# Patient Record
Sex: Female | Born: 1960 | Hispanic: Yes | Marital: Married | Smoking: Never smoker
Health system: Southern US, Community
[De-identification: ages and names within clinical notes are randomized; demographics above are authoritative.]

## PROBLEM LIST (undated history)

## (undated) ENCOUNTER — Emergency Department: Admission: EM | Discharge status: Absent without leave | Payer: BC Managed Care – PPO

## (undated) DIAGNOSIS — G893 Neoplasm related pain (acute) (chronic): Secondary | ICD-10-CM

## (undated) DIAGNOSIS — Z789 Other specified health status: Secondary | ICD-10-CM

## (undated) DIAGNOSIS — C801 Malignant (primary) neoplasm, unspecified: Secondary | ICD-10-CM

## (undated) HISTORY — DX: Other specified health status: Z78.9

## (undated) HISTORY — DX: Neoplasm related pain (acute) (chronic): G89.3

## (undated) HISTORY — PX: OTHER SURGICAL HISTORY: SHX169

---

## 2013-05-04 ENCOUNTER — Ambulatory Visit: Payer: Self-pay | Admitting: Family Medicine

## 2017-09-14 ENCOUNTER — Other Ambulatory Visit: Payer: Self-pay | Admitting: Family Medicine

## 2017-09-14 DIAGNOSIS — Z1231 Encounter for screening mammogram for malignant neoplasm of breast: Secondary | ICD-10-CM

## 2018-10-26 DIAGNOSIS — U071 COVID-19: Secondary | ICD-10-CM

## 2018-10-26 HISTORY — DX: COVID-19: U07.1

## 2019-04-06 ENCOUNTER — Other Ambulatory Visit: Payer: Self-pay | Admitting: Physician Assistant

## 2019-04-06 DIAGNOSIS — Z1231 Encounter for screening mammogram for malignant neoplasm of breast: Secondary | ICD-10-CM

## 2019-07-26 ENCOUNTER — Other Ambulatory Visit: Payer: Self-pay | Admitting: Physician Assistant

## 2019-07-26 DIAGNOSIS — R221 Localized swelling, mass and lump, neck: Secondary | ICD-10-CM

## 2019-07-28 ENCOUNTER — Ambulatory Visit: Payer: Self-pay | Attending: Internal Medicine

## 2019-07-28 DIAGNOSIS — Z23 Encounter for immunization: Secondary | ICD-10-CM

## 2019-07-28 NOTE — Progress Notes (Signed)
   Covid-19 Vaccination Clinic  Name:  Jill Rodriguez    MRN: [id number] DOB: 12-Apr-1961  07/28/2019  Ms. Jill Rodriguez was observed post Covid-19 immunization for 15 minutes without incident. She was provided with Vaccine Information Sheet and instruction to access the V-Safe system.   Ms. Jill Rodriguez was instructed to call 911 with any severe reactions post vaccine: [name] Kitchen Difficulty breathing  . Swelling of face and throat  . A fast heartbeat  . A bad rash all over body  . Dizziness and weakness   Immunizations Administered    Name Date Dose VIS Date Route   Pfizer COVID-19 Vaccine 07/28/2019  4:03 PM 0.3 mL 04/07/2019 Intramuscular   Manufacturer: [place]   Lot: [id number]   [place]: [id number]

## 2019-08-10 ENCOUNTER — Other Ambulatory Visit: Payer: Self-pay

## 2019-08-10 ENCOUNTER — Ambulatory Visit
Admission: RE | Admit: 2019-08-10 | Discharge: 2019-08-10 | Disposition: A | Discharge status: Home / Self Care | Payer: BC Managed Care – PPO | Source: Ambulatory Visit | Attending: Physician Assistant | Admitting: Physician Assistant

## 2019-08-10 DIAGNOSIS — R221 Localized swelling, mass and lump, neck: Secondary | ICD-10-CM

## 2019-08-16 ENCOUNTER — Other Ambulatory Visit: Payer: Self-pay

## 2019-08-16 ENCOUNTER — Other Ambulatory Visit (HOSPITAL_COMMUNITY)
Admission: RE | Admit: 2019-08-16 | Discharge: 2019-08-16 | Disposition: A | Discharge status: Home / Self Care | Payer: BC Managed Care – PPO | Source: Ambulatory Visit | Attending: Obstetrics and Gynecology | Admitting: Obstetrics and Gynecology

## 2019-08-16 ENCOUNTER — Encounter: Payer: Self-pay | Admitting: Obstetrics and Gynecology

## 2019-08-16 ENCOUNTER — Ambulatory Visit (INDEPENDENT_AMBULATORY_CARE_PROVIDER_SITE_OTHER): Payer: Self-pay | Admitting: Obstetrics and Gynecology

## 2019-08-16 VITALS — BP 145/74 | HR 88 | Ht 61.5 in | Wt 188.1 lb

## 2019-08-16 DIAGNOSIS — N888 Other specified noninflammatory disorders of cervix uteri: Secondary | ICD-10-CM

## 2019-08-16 DIAGNOSIS — N950 Postmenopausal bleeding: Secondary | ICD-10-CM

## 2019-08-16 DIAGNOSIS — Z124 Encounter for screening for malignant neoplasm of cervix: Secondary | ICD-10-CM | POA: Insufficient documentation

## 2019-08-16 DIAGNOSIS — R102 Pelvic and perineal pain: Secondary | ICD-10-CM

## 2019-08-16 NOTE — Progress Notes (Signed)
Pt present due to PMB polyp of cervix. Pt having lower abd pain that comes and go. Pt is currently not taking any medication for pain.

## 2019-08-16 NOTE — Progress Notes (Signed)
GYNECOLOGY CLINIC PROGRESS NOTE Subjective:   Spanish interpreter present for today's visit.    Jill Rodriguez is a 59 y.o. [phone] Spanish speaking post-menopausal female who presents as a referral from [facility] for concerns regarding postmenopausal bleeding. She has been menopausal since age 5. She has never been on HRT. Bleeding has been ongoing for the past several months per records from the past visit, however patient reports that it has been off and on for the past year. She reports passage of clots at times, and otherwise sometimes is just spotting.  Bleeding can last from days to weeks and can range from light to moderate flow. Associated crampy pelvic/abdominal pain.  Workup to date: CBC and TSH.  Of note, patient does report a history of PMB ~ 5 years ago, states she was treated with pills for a short time and the bleeding resolved.   Menstrual History: Menarche age: 39 No LMP recorded. (Menstrual status: Other). Last pap smear: 2019.  Patient denies history of abnormal pap smears.  Mammogram: 2015.  Ordered by PCP in December 2020.     OB History  Gravida Para Term Preterm AB Living  3 3 3     3   SAB TAB Ectopic Multiple Live Births          3    # Outcome Date GA Lbr Len/2nd Weight Sex Delivery Anes PTL Lv  3 Term 50    F Vag-Spont   LIV  2 Term 1985    F Vag-Spont   LIV  1 Term 57    M Vag-Spont   LIV     Past Medical History:  Diagnosis Date  . No pertinent past medical history     Family History  Problem Relation Age of Onset  . Blindness Mother   . Glaucoma Mother   . Diabetes Father     Past Surgical History:  Procedure Laterality Date  . no surgical history      Social History   Socioeconomic History  . Marital status: Married    Spouse name: Not on file  . Number of children: Not on file  . Years of education: Not on file  . Highest education level: Not on file  Occupational History  . Not on file    Tobacco Use  . Smoking status: Never Smoker  . Smokeless tobacco: Never Used  Substance and Sexual Activity  . Alcohol use: Never  . Drug use: Never  . Sexual activity: Yes    Birth control/protection: None  Other Topics Concern  . Not on file  Social History Narrative  . Not on file   Social Determinants of Health   Financial Resource Strain:   . Difficulty of Paying Living Expenses:   Food Insecurity:   . Worried About Charity fundraiser in the Last Year:   . Arboriculturist in the Last Year:   Transportation Needs:   . Film/video editor (Medical):   [name] Kitchen Lack of Transportation (Non-Medical):   Physical Activity:   . Days of Exercise per Week:   . Minutes of Exercise per Session:   Stress:   . Feeling of Stress :   Social Connections:   . Frequency of Communication with Friends and Family:   . Frequency of Social Gatherings with Friends and Family:   . Attends Religious Services:   . Active Member of Clubs or Organizations:   . Attends Archivist Meetings:   .  Marital Status:   Intimate Partner Violence:   . Fear of Current or Ex-Partner:   . Emotionally Abused:   [name] Kitchen Physically Abused:   . Sexually Abused:     No current outpatient medications on file prior to visit.   No current facility-administered medications on file prior to visit.    No Known Allergies    Review of Systems Constitutional: negative for chills, fatigue, fevers and sweats Eyes: negative for irritation, redness and visual disturbance Ears, nose, mouth, throat, and face: negative for hearing loss, nasal congestion, snoring and tinnitus Respiratory: negative for asthma, cough, sputum Cardiovascular: negative for chest pain, dyspnea, exertional chest pressure/discomfort, irregular heart beat, palpitations and syncope Gastrointestinal: negative for abdominal pain, change in bowel habits, nausea and vomiting Genitourinary: positive for postmenopausal bleeding (see HPI) and pelvic  pain. Negative for genital lesions, sexual problems and vaginal discharge, dysuria and urinary incontinence Integument/breast: negative for breast lump, breast tenderness and nipple discharge Hematologic/lymphatic: negative for bleeding and easy bruising Musculoskeletal:negative for back pain and muscle weakness Neurological: negative for dizziness, headaches, vertigo and weakness Endocrine: negative for diabetic symptoms including polydipsia, polyuria and skin dryness Allergic/Immunologic: negative for hay fever and urticaria      Objective:    BP (!) 145/74   Pulse 88   Ht 5' 1.5" (1.562 m)   Wt 188 lb 1.6 oz (85.3 kg)   BMI 34.97 kg/m   General appearance: alert and no distress Lungs: clear to auscultation bilaterally Heart: regular rate and rhythm, S1, S2 normal, no murmur, click, rub or gallop Abdomen: normal findings: no masses palpable and soft and abnormal findings:  mild tenderness in lower abdomien, midline Pelvic:    -  VULVA: normal appearing vulva with no masses, tenderness or lesions,      -  VAGINA: normal appearing vagina with normal color and discharge, no lesions.  Moderate amount of blood in vaginal vault.     - CERVIX: friable, lesion present: cervix with irregularly shaped growth that is possibly eroding the cervix, firm.     - UTERUS: uterus is normal size, shape, consistency and nontender    - ADNEXA: normal adnexa in size, nontender and no masses,     - RECTAL: not performed Extremities: extremities normal, atraumatic, no cyanosis or edema Neurologic: Grossly normal   Assessment:    Postmenopausal bleeding   Cervical mass  Pelvic cramping  Plan:   - Diagnosis explained in detail, including differential. Discussed etiologies of postmenopausal bleeding, concern about precancerous/hyperplasia or cancerous etiology (5 to 10% percent of cases).  Exclusion of cancer is the main objective; therefore, treatment is may or may not be necessary once cancer has  been excluded.  Evaluation includes endometrial biopsy and pelvic ultrasound. Biopsy performed today.  Will return in 1 week for ultrasound and follow up of all results.  - Recent CBC and TSH performed at [facility] were normal. No concerns for anemia in light of bleeding at this time. Patient also asymptomatic.  - Given Tylenol for cramping in the office. Advised that she can continue use at home as well.     Endometrial Biopsy Procedure Note  The patient is positioned on the exam table in the dorsal lithotomy position. Bimanual exam confirms uterine position and size. A Graves speculum is placed into the vagina. A single toothed tenaculum is placed onto the anterior lip of the cervix. The pipette is placed into the endocervical canal and is advanced to the uterine fundus. Using a  piston like technique, with vacuum created by withdrawing the stylus, the endometrial specimen is obtained and transferred to the biopsy container. Four passes made, with minimal tissue collected. Minimal bleeding is encountered. The procedure is well tolerated.   Uterine Position:mid    Uterine Length:  7 cm   Uterine Specimen: Scant.  Yellow fluid and blood also collected.    Post procedure instructions are given. The patient is scheduled for follow up appointment.    Rubie Maid, MD [facility]

## 2019-08-16 NOTE — Patient Instructions (Signed)
Metrorragia posmenopusica Postmenopausal Bleeding  La metrorragia posmenopusica es el sangrado que tiene una mujer despus de haber entrado en la menopausia. La menopausia es el final de la edad frtil de la mujer. Despus de la menopausia, una mujer deja de ovular, por lo que deja de tener perodos menstruales. La metrorragia posmenopusica podra tener varias causas; entre ellas:  Terapia hormonal para la menopausia.  Atrofia del endometrio. Despus de la menopausia, los bajos niveles de las hormonas estrgeno hacen que la membrana que recubre el tero (endometrio) se afine. Podra tener sangrado mientras el endometrio se afina.  Hiperplasia de endometrio. Esta afeccin es consecuencia de un exceso de las hormonas estrgeno y de bajos niveles de las hormonas progesterona. El exceso de estrgeno hace que el endometrio se engrose, lo que puede provocar sangrado. En algunos casos, esto puede provocar cncer de tero.  Cncer de endometrio.  Crecimientos (plipos) no cancerosos en el endometrio, el recubrimiento del tero, o en el cuello uterino.  Fibromas uterinos. Estos son crecimientos no cancerosos dentro o alrededor del tejido muscular del tero, que pueden provocar un sangrado abundante. Todo tipo de sangrado posmenopusico, incluso si parece ser un perodo menstrual normal, debe ser evaluado por su mdico. El tratamiento depender de la causa del sangrado. Siga estas indicaciones en su casa:  Est atenta a cualquier cambio en los sntomas.  Evite las duchas vaginales y el uso de tampones como se lo haya indicado el mdico.  Cmbiese las toallas higinicas de forma regular.  Hgase exmenes plvicos regulares y pruebas de Papanicolaou.  Tome los suplementos de hierro como se lo haya indicado el mdico.  Tome los medicamentos de venta libre y los recetados solamente como se lo haya indicado el mdico.  Concurra a todas las visitas de control como se lo haya indicado el mdico.  Esto es importante. Comunquese con un mdico si:  El sangrado dura ms de 1 semana.  Siente dolor abdominal.  Tiene sangrado durante las relaciones sexuales o luego de estas.  Tiene sangrado que ocurre con mayor frecuencia que cada 3semanas. Solicite ayuda de inmediato si:  Usted tiene fiebre, escalofros, mareos, dolor de cabeza, dolores musculares y hemorragias.  Tiene dolor intenso con el sangrado.  Elimina cogulos de sangre.  Tiene un sangrado abundante, necesita ms de 1toalla higinica por hora y nunca present esto antes.  Siente que va a desmayarse. Resumen  La metrorragia posmenopusica es el sangrado que tiene una mujer despus de haber entrado en la menopausia.  El sangrado posmenopusico podra tener varias causas. El tratamiento depender de la causa del sangrado.  Todo tipo de sangrado posmenopusico, incluso si parece ser un perodo menstrual normal, debe ser evaluado por su mdico.  Es muy importante que est atenta a cualquier cambio en los sntomas y que concurra a todas las visitas de control como se lo haya indicado el mdico. Esta informacin no tiene como fin reemplazar el consejo del mdico. Asegrese de hacerle al mdico cualquier pregunta que tenga. Document Revised: 01/04/2017 Document Reviewed: 01/04/2017 Elsevier Patient Education  2020 Elsevier Inc.  

## 2019-08-18 ENCOUNTER — Ambulatory Visit: Payer: BC Managed Care – PPO | Attending: Internal Medicine

## 2019-08-18 DIAGNOSIS — Z23 Encounter for immunization: Secondary | ICD-10-CM

## 2019-08-18 LAB — SURGICAL PATHOLOGY

## 2019-08-18 NOTE — Progress Notes (Signed)
   Covid-19 Vaccination Clinic  Name:  Jill Rodriguez    MRN: [id number] DOB: Mar 05, 1961  08/18/2019  Jill Rodriguez was observed post Covid-19 immunization for 15 minutes without incident. She was provided with Vaccine Information Sheet and instruction to access the V-Safe system.   Jill Rodriguez was instructed to call 911 with any severe reactions post vaccine: [name] Kitchen Difficulty breathing  . Swelling of face and throat  . A fast heartbeat  . A bad rash all over body  . Dizziness and weakness   Immunizations Administered    Name Date Dose VIS Date Route   Pfizer COVID-19 Vaccine 08/18/2019  3:56 PM 0.3 mL 06/21/2018 Intramuscular   Manufacturer: [employer]   Lot: [id number]   [place]: [id number]

## 2019-08-22 NOTE — Progress Notes (Signed)
Pt present for follow up for PMS and ultrasound results. Pt stated that she was doing well.

## 2019-08-23 ENCOUNTER — Ambulatory Visit (INDEPENDENT_AMBULATORY_CARE_PROVIDER_SITE_OTHER): Payer: BC Managed Care – PPO

## 2019-08-23 ENCOUNTER — Ambulatory Visit (INDEPENDENT_AMBULATORY_CARE_PROVIDER_SITE_OTHER): Payer: BC Managed Care – PPO | Admitting: Obstetrics and Gynecology

## 2019-08-23 ENCOUNTER — Other Ambulatory Visit: Payer: Self-pay

## 2019-08-23 ENCOUNTER — Encounter: Payer: Self-pay | Admitting: Obstetrics and Gynecology

## 2019-08-23 VITALS — BP 131/76 | HR 73 | Ht 61.5 in | Wt 188.5 lb

## 2019-08-23 DIAGNOSIS — C531 Malignant neoplasm of exocervix: Secondary | ICD-10-CM | POA: Diagnosis not present

## 2019-08-23 DIAGNOSIS — N950 Postmenopausal bleeding: Secondary | ICD-10-CM

## 2019-08-23 DIAGNOSIS — N888 Other specified noninflammatory disorders of cervix uteri: Secondary | ICD-10-CM | POA: Diagnosis not present

## 2019-08-23 NOTE — Progress Notes (Signed)
GYNECOLOGY PROGRESS NOTE  Subjective:    Patient ID: Jill Rodriguez, female    DOB: 1961/01/22, 59 y.o.   MRN: [id number]  HPI Spanish interpreter and patient's daughter present for today's visit.  Patient is a 59 y.o. [id number] Spanish-speaking female who presents for follow up of ultrasound and biopsy results.  Patient has a history of PMB. Patient reports that since her last visit last week that her bleeding has slowed down.  She denies dizziness, shortness of breath.  She currently denies any pelvic pain.   The following portions of the patient's history were reviewed and updated as appropriate: allergies, current medications, past family history, past medical history, past social history, past surgical history and problem list.  Review of Systems Pertinent items noted in HPI and remainder of comprehensive ROS otherwise negative.   Objective:   Blood pressure 131/76, pulse 73, height 5' 1.5" (1.562 m), weight 188 lb 8 oz (85.5 kg). General appearance: alert and no distress Abdomen: soft, non-tender; bowel sounds normal; no masses,  no organomegaly Pelvic:  -  VULVA: normal appearing vulva with no masses, tenderness or lesions,      -  VAGINA: normal appearing vagina with normal color and discharge, no lesions.  Small amount of dark red blood in vaginal vault.     - CERVIX: friable, lesion present: cervix with irregularly shaped growth that is possibly eroding the cervix, firm.     - UTERUS: uterus is normal size, shape, consistency and nontender    - ADNEXA: normal adnexa in size, nontender and no masses,     - RECTAL: not performed   Pathology:  SURGICAL PATHOLOGY  CASE: [id number]  PATIENT: [place]  Surgical Pathology Report   Clinical History: PMB, cervical mass (cm)   FINAL MICROSCOPIC DIAGNOSIS:   A. ENDOMETRIUM, BIOPSY:  - Poorly differentiated squamous cell carcinoma.  - See comment.   COMMENT:  There are fragments of poorly differentiated  squamous cell carcinoma.  Endometrial tissue is not identified.     Imaging:  Patient Name: Jill Rodriguez DOB: 09/13/1960 MRN: [id number] ULTRASOUND REPORT  Location: [facility]  Date of Service: 08/23/2019     Indications:AUB Findings:  The uterus is anteverted and measures 6.1 x 3.4 x 4.9 cm. Echo texture is homogenous without evidence of focal masses. Within the uterus are multiple suspected fibroids measuring:  The Endometrium measures 8 mm. Echogenic endometrial microcalcifications seen through -out endometrium. An hypoechoic area seen in the fundal aspect of the endometrium measuring 0.8 x 1.1 x 2.3 cm  Right Ovary measures 1.6 x 1.1 x 1.2  cm. It is normal in appearance. Left Ovary measures 2.2 x 1.5 x 1.7 cm. It is normal in appearance. Survey of the adnexa demonstrates no adnexal masses. There is no free fluid in the cul de sac.  Impression: 1. Endometrial microcalcifications as described above with a hypoechoic area most likely retain blood in the fundal aspect of the endometrium. 2. Survey of the adnexa demonstrates no adnexal masses.  Recommendations: 1.Clinical correlation with the patient's History and Physical Exam.   Jenine M. Albertine Grates    RDMS     Assessment:   Suspected cervical cancer Post-menopausal bleeding  Plan:   -Discussion had with patient regarding her recent pathology and ultrasound results.  Pap smear was also performed at patient's last visit however results do not appear to have been resulted.  However based on physical exam with cervical lesion present, pathology noting squamous cell carcinoma,  and inability to identify endometrial tissue despite endometrial biopsy with 4 passes, my concern is that of cervical cancer.  Discussion had with patient on need for immediate follow-up with GYN oncology,  who can perform further work-up as needed as well as discussed treatment options.  All questions from patient and her daughter  were answered.   A total of 15 minutes were spent face-to-face with the patient during this encounter and over half of that time dealt with counseling and coordination of care.

## 2019-08-23 NOTE — Patient Instructions (Signed)
Cncer de cuello del tero Cervical Cancer  El cncer de cuello del tero es el crecimiento anormal de clulas en el cuello del tero. El cuello del tero es la abertura y la parte inferior del tero. Se encuentra entre la vagina y Nurse, learning disability. Hay tres tipos principales de cncer de cuello del tero:  Carcinoma de clulas escamosas. Este tipo de cncer se inicia en las clulas que recubren la superficie del cuello del tero.  Adenocarcinoma. Este tipo de cncer de cuello del tero se inicia en las clulas glandulares que recubren el cuello del tero (glandular).  Sarcoma de cuello del tero. Es un tumor poco frecuente que se cree que se desarrolla de Development worker, community. Cules son las causas? La mayora de los casos de cncer de cuello del tero son provocados por un virus llamado virus del Engineer, technical sales (VPH). Qu incrementa el riesgo? Esta afeccin es ms probable en las mujeres que tienen estas caractersticas:  Tienen una infeccin viral de transmisin sexual. Estas incluyen los siguientes: ? Clamidia. ? Herpes. ? VPH.  Tienen entre [phone] [id number].  Comenzaron su actividad sexual antes de los 18aos.  Son afroamericanas, hispanas, asiticas o isleas del Pacfico.  Tienen ms de [place] pareja sexual o tienen sexo con alguien que tenga ms de una pareja sexual.  No usan preservativos con sus Advertising copywriter.  Tuvieron cncer de vagina o vulva.  Usan anticonceptivos orales, tambin llamados pldoras anticonceptivas.  Fuman o estn expuestas al humo que [employer].  Tienen el sistema inmunitario debilitado.  Son hijas de mujeres que tomaron dietilestilbestrol (DES) durante el Media planner.  Su madre o alguna de sus hermanas tuvieron cncer de cuello del tero.  Tienen antecedentes de displasia de cuello del tero. Cules son los signos o los sntomas? Los sntomas generalmente no estn presentes en las primeras etapas del cncer de cuello del tero. Una vez que el  cncer est en el cuello del tero y se propaga en los tejidos circundantes, los sntomas podran incluir los siguientes:  Sangrado vaginal anormal o sangrado menstrual que es ms prolongado o intenso que lo habitual.  Sangrado vaginal despus de tener relaciones sexuales, hacerse una ducha vaginal o hacerse un Papanicolaou.  Sangrado vaginal despus de la menopausia.  Secrecin vaginal anormal.  Molestias o dolor plvico.  Papanicolaou anormal.  Dolor durante las relaciones sexuales.  Cansancio (fatiga). Cmo se diagnostica? Esta afeccin se diagnostica en funcin de los antecedentes mdicos y de un examen fsico, que incluye un examen plvico y un Papanicolaou. Es posible que el mdico tambin le realice otros estudios o procedimientos, como:  Colposcopa. En este procedimiento se utiliza un microscopio para ampliar y examinar exhaustivamente las clulas del cuello del tero, la vagina y la vulva.  Biopsias cervicales. Es un procedimiento en el que se toman pequeas muestras de tejido del cuello del tero para examinarlas en un microscopio.  Biopsia en cono. Este procedimiento se realiza para Chief of Staff o para extirpar tejido canceroso. Podran realizarle otros estudios de diagnstico por imgenes, por ejemplo:  Ecografa.  Exploracin por tomografa computarizada (TC).  Resonancia magntica (RM).  Tomografa por emisin de positrones (TEP). Es posible que deban realizarle otros estudios para Hydrographic surveyor si se propagaron las clulas cancerosas (metstasis). Si se confirma el diagnstico de cncer de cuello del tero, se lo estadificar para determinar su gravedad y magnitud. La estadificacin es la evaluacin de lo siguiente:  El tamao del tumor.  Si el cncer se ha diseminado.  Adnde se ha diseminado.  Cmo se trata? El tratamiento de esta afeccin depender del estadio del cncer. El tratamiento puede incluir lo siguiente:  Biopsia en cono para retirar el tejido  canceroso.  Extirpacin de todo el tero y el cuello del tero.  Extirpacin del tero, el cuello del tero, la porcin superior de la vagina, los ganglios linfticos y el tejido que los rodea (histerectoma radical modificada). Los ovarios puede dejarse o ser extirpados.  Medicamentos para tratar Science writer. Entre ellos, quimioterapia o terapia dirigida.  Una combinacin de [place], radiacin y quimioterapia.  Terapia biolgica. Estas son sustancias que ayudan a Art therapist inmunitario en su lucha contra el cncer o las infecciones. Pueden utilizarse en combinacin con la quimioterapia. Siga estas indicaciones en su casa:  Tome los medicamentos de venta libre y los recetados solamente como se lo haya indicado el mdico.  No consuma ningn producto que contenga nicotina o tabaco, como cigarrillos y Psychologist, sport and exercise. Si necesita ayuda para dejar de fumar, consulte al mdico.  No tenga relaciones sexuales hasta que el mdico lo autorice.  Use un condn cada vez que tenga [employer].  Considere la posibilidad de unirse a un grupo de apoyo para personas que le han diagnosticado cncer de cuello del tero.  Concurra a todas las visitas de control como se lo haya indicado el mdico. Esto es importante. Cmo se evita?  La colocacin de la Charity fundraiser VPH puede prevenir la mayora de los casos de cncer de cuello del tero que ocurren. Dnde encontrar ms informacin  Office manager ([place]): www.cancer.gov  Sociedad Scientist, clinical (histocompatibility and immunogenetics) ([place]): www.cancer.org Comunquese con un mdico si:  Siente dolor o presin en la zona plvica.  Siente dolor en la espalda o en una pierna.  Tiene fiebre.  Tiene flujo o sangrado vaginales anormales.  Pierde peso.  Tiene tos. Solicite ayuda de inmediato si:  No puede orinar.  Observa sangre en la orina.  Observa sangre en las heces.  Comienza  a sentir dolor intenso en la espalda, el estmago o la pelvis. Resumen  El cncer de cuello del tero es el crecimiento anormal de clulas en el cuello del tero. El cuello del tero es la abertura y la parte inferior del tero, que se encuentra entre la vagina y Nurse, learning disability.  La mayora de los casos de cncer de cuello del tero son provocados por un virus llamado virus del Engineer, technical sales (VPH).  El tratamiento de esta afeccin depender del estadio del cncer. El tratamiento podra incluir una combinacin de [place], radiacin y quimioterapia.  La colocacin de la Charity fundraiser VPH puede prevenir la mayora de los casos de cncer de cuello del tero que ocurren. Esta informacin no tiene Marine scientist el consejo del mdico. Asegrese de hacerle al mdico cualquier pregunta que tenga. Document Revised: 10/29/2016 Document Reviewed: 10/29/2016 Elsevier Patient Education  [place].

## 2019-08-24 ENCOUNTER — Encounter: Payer: Self-pay | Admitting: Obstetrics and Gynecology

## 2019-08-24 LAB — COMPREHENSIVE METABOLIC PANEL
ALT: 31 IU/L (ref 0–32)
AST: 30 IU/L (ref 0–40)
Albumin/Globulin Ratio: 1.5 (ref 1.2–2.2)
Albumin: 4.5 g/dL (ref 3.8–4.9)
Alkaline Phosphatase: 100 IU/L (ref 39–117)
BUN/Creatinine Ratio: 22 (ref 9–23)
BUN: 14 mg/dL (ref 6–24)
Bilirubin Total: 0.4 mg/dL (ref 0.0–1.2)
CO2: 24 mmol/L (ref 20–29)
Calcium: 9.2 mg/dL (ref 8.7–10.2)
Chloride: 104 mmol/L (ref 96–106)
Creatinine, Ser: 0.64 mg/dL (ref 0.57–1.00)
GFR calc Af Amer: 114 mL/min/{1.73_m2} (ref >59)
GFR calc non Af Amer: 99 mL/min/{1.73_m2} (ref >59)
Globulin, Total: 3.1 g/dL (ref 1.5–4.5)
Glucose: 100 mg/dL — ABNORMAL HIGH (ref 65–99)
Potassium: 4 mmol/L (ref 3.5–5.2)
Sodium: 141 mmol/L (ref 134–144)
Total Protein: 7.6 g/dL (ref 6.0–8.5)

## 2019-08-24 LAB — CBC
Hematocrit: 42 % (ref 34.0–46.6)
Hemoglobin: 13.8 g/dL (ref 11.1–15.9)
MCH: 29.9 pg (ref 26.6–33.0)
MCHC: 32.9 g/dL (ref 31.5–35.7)
MCV: 91 fL (ref 79–97)
Platelets: 254 10*3/uL (ref 150–450)
RBC: 4.62 x10E6/uL (ref 3.77–5.28)
RDW: 12.8 % (ref 11.7–15.4)
WBC: 5.3 10*3/uL (ref 3.4–10.8)

## 2019-08-24 LAB — CYTOLOGY - PAP
Comment: NEGATIVE
Comment: NEGATIVE
Comment: NEGATIVE
HPV 16: NEGATIVE
HPV 18 / 45: POSITIVE — AB
High risk HPV: POSITIVE — AB

## 2019-08-30 ENCOUNTER — Other Ambulatory Visit: Payer: Self-pay

## 2019-08-30 ENCOUNTER — Encounter: Payer: Self-pay | Admitting: Obstetrics and Gynecology

## 2019-08-30 ENCOUNTER — Inpatient Hospital Stay: Payer: BC Managed Care – PPO | Attending: Obstetrics and Gynecology | Admitting: Obstetrics and Gynecology

## 2019-08-30 DIAGNOSIS — Z780 Asymptomatic menopausal state: Secondary | ICD-10-CM | POA: Insufficient documentation

## 2019-08-30 DIAGNOSIS — C530 Malignant neoplasm of endocervix: Secondary | ICD-10-CM

## 2019-08-30 DIAGNOSIS — C539 Malignant neoplasm of cervix uteri, unspecified: Secondary | ICD-10-CM | POA: Insufficient documentation

## 2019-08-30 DIAGNOSIS — Z0289 Encounter for other administrative examinations: Secondary | ICD-10-CM

## 2019-08-30 DIAGNOSIS — Z8616 Personal history of COVID-19: Secondary | ICD-10-CM | POA: Insufficient documentation

## 2019-08-30 NOTE — Progress Notes (Signed)
Gynecologic Oncology Consult Visit   Referring Provider: Dr. Marcelline Mates  Chief Concern: cervical cancer, clinical stage IB1  Subjective:  Jill Rodriguez is a 59 y.o. married P3 female who is seen in consultation from Dr. Marcelline Mates for cervical cancer.  Accompanied by her daughter and interpreter present.   Bleeding for years.  She had COVID in July 2020 and was supposed to have follow up for long term vaginal bleeding but not seen until 4/20.  Patient seen by Dr Marcelline Mates 08/16/19 for PMB. " [id number] Spanish speaking post-menopausal female who presents as a referral from [facility] for concerns regarding postmenopausal bleeding. She has been menopausal since age 27. She has never been on HRT. Bleeding has been ongoing for the past several months per records from the past visit, however patient reports that it has been off and on for the past year. She reports passage of clots at times, and otherwise sometimes is just spotting.  Bleeding can last from days to weeks and can range from light to moderate flow. Associated crampy pelvic/abdominal pain.  Workup to date: CBC and TSH.  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 No endometrium seen. CERVIX:friable, lesion present: cervix with irregularly shaped growth that is possibly eroding the cervix, firm.  A. ENDOMETRIUM, BIOPSY:  - Poorly differentiated squamous cell carcinoma.  COMMENT:  There are fragments of poorly differentiated squamous cell carcinoma. Endometrial tissue is not identified.   08/23/19 Pelvic US The Endometriummeasures 8 mm. Echogenic endometrial microcalcifications seen through -out endometrium. An hypoechoic area seen in the fundal aspect of the endometrium measuring 0.8 x 1.1 x 2.3 cm  Right Ovary measures 1.6  x 1.1 x 1.2 cm. It is normal in appearance. Left Ovary measures 2.2 x 1.5 x 1.7 cm. It is normal in appearance. Survey of the adnexa demonstrates no adnexal masses. There is no free fluid in the cul de sac.  Impression: 1. Endometrial microcalcifications as described above with a hypoechoic area most likely retain blood in the fundal aspect of the endometrium. 2. Survey of the adnexa demonstrates no adnexal masses.  Last pap smear: 2019.  Patient denies history of abnormal pap smears.  Mammogram: 2015.  Ordered by PCP in December 2020.   She has prominent right neck pulse and CT angio neck was ordered for evaluation.   Problem List: Patient Active Problem List   Diagnosis Date Noted  . Cervical cancer ([place]) 08/30/2019    Past Medical History: Past Medical History:  Diagnosis Date  . COVID-19 10/2018  . No pertinent past medical history     Past Surgical History: Past Surgical History:  Procedure Laterality Date  . no surgical history      OB History:  OB History  Gravida Para Term Preterm AB Living  3 3 3     3   SAB TAB Ectopic Multiple Live Births          3    # Outcome Date GA Lbr Len/2nd Weight Sex Delivery Anes PTL Lv  3 Term 33    F Vag-Spont   LIV  2 Term 1985    F Vag-Spont   LIV  1 Term 67    M Vag-Spont   LIV    Family History: Family History  Problem Relation Age of  Onset  . Blindness Mother   . Glaucoma Mother   . Diabetes Father     Social History: Social History   Socioeconomic History  . Marital status: Married    Spouse name: Not on file  . Number of children: Not on file  . Years of education: Not on file  . Highest education level: Not on file  Occupational History  . Not on file  Tobacco Use  . Smoking status: Never Smoker  . Smokeless tobacco: Never Used  Substance and Sexual Activity  . Alcohol use: Never  . Drug use: Never  . Sexual activity: Yes    Birth control/protection: None  Other Topics Concern  . Not on  file  Social History Narrative  . Not on file   Social Determinants of Health   Financial Resource Strain:   . Difficulty of Paying Living Expenses:   Food Insecurity:   . Worried About Charity fundraiser in the Last Year:   . Arboriculturist in the Last Year:   Transportation Needs:   . Film/video editor (Medical):   [name] Kitchen Lack of Transportation (Non-Medical):   Physical Activity:   . Days of Exercise per Week:   . Minutes of Exercise per Session:   Stress:   . Feeling of Stress :   Social Connections:   . Frequency of Communication with Friends and Family:   . Frequency of Social Gatherings with Friends and Family:   . Attends Religious Services:   . Active Member of Clubs or Organizations:   . Attends Archivist Meetings:   [name] Kitchen Marital Status:   Intimate Partner Violence:   . Fear of Current or Ex-Partner:   . Emotionally Abused:   [name] Kitchen Physically Abused:   . Sexually Abused:     Allergies: No Known Allergies  Current Medications: No current outpatient medications on file.   No current facility-administered medications for this visit.    Review of Systems General: negative for, fevers, chills, fatigue, changes in sleep, changes in weight or appetite Skin: negative for changes in color, texture, moles or lesions Eyes: negative for, changes in vision, pain, diplopia HEENT: negative for, change in hearing, pain, discharge, tinnitus, vertigo, voice changes, sore throat, neck masses Pulmonary: negative for, dyspnea, orthopnea, productive cough Cardiac: negative for, palpitations, syncope, pain, discomfort, pressure Gastrointestinal: negative for, dysphagia, nausea, vomiting, jaundice, pain, constipation, diarrhea, hematemesis, hematochezia Musculoskeletal: negative for, pain, stiffness, swelling, range of motion limitation Hematology: negative for, easy bruising Neurologic/Psych: negative for, headaches, seizures, paralysis, weakness, tremor, change in gait,  change in sensation, mood swings, depression, anxiety, change in memory  Objective:  Physical Examination:  BP (!) 159/80   Pulse 76   Temp (!) 96.5 F (35.8 C) (Tympanic)   Resp 16   Ht 5' 1.6" (1.565 m)   Wt 186 lb 4.8 oz (84.5 kg)   BMI 34.52 kg/m    ECOG Performance Status: 1 - Symptomatic but completely ambulatory  General appearance: alert, cooperative and appears stated age HEENT:PERRLA and thyroid without masses Neck: visible bounding pulse in right neck.  No bruit appreciated.  Lymph node survey: non-palpable, axillary, inguinal, supraclavicular Cardiovascular: regular rate and rhythm, no murmurs or gallops Respiratory: normal air entry, lungs clear to auscultation and no rales, rhonchi or wheezing Breast exam: not examined. Abdomen: no hernias and well healed incision Back: inspection of back is normal Extremities: extremities normal, atraumatic, no cyanosis or edema Skin exam - normal coloration and turgor, no  rashes, no suspicious skin lesions noted. Neurological exam reveals alert, oriented, normal speech, no focal findings or movement disorder noted.  Pelvic: exam chaperoned by nurse;  Vulva: normal appearing vulva with no masses, tenderness or lesions; Vagina: normal vagina; Adnexa: normal adnexa in size, nontender and no masses; Uterus: uterus is normal size, shape, consistency and nontender; Cervix: Friable lesion; Rectal: confirms, no parametrial involvement  Lab Review Labs on site today: Lab Results  Component Value Date   WBC 5.3 08/23/2019   HGB 13.8 08/23/2019   HCT 42.0 08/23/2019   MCV 91 08/23/2019   PLT 254 08/23/2019       Assessment:  Jill Rodriguez is a 59 y.o.  female diagnosed with poorly differentiated squamous cell cervical cancer. The tumor is about 3 cm in diameter and replaces the whole cervix. Clinical stage IB1. No vaginal involvement, but possible very slight retraction to the left.   She has prominent right neck pulse and CT  angio neck was ordered for evaluation.  Medical co-morbidities complicating care: none.  Plan:   Problem List Items Addressed This Visit      Genitourinary   Cervical cancer ([place])     We discussed options for management including surgery with radical hysterectomy and pelvic node dissection versus chemoradiation.  She may be a good candidate for radical hysterectomy based on her performance status and exam with a 3 cm tumor, but need to get PET/CT to rule out metastatic disease.  She has probably had this cancer for awhile based on her long history of bleeding and could well have nodal or other metastatic disease.   Will order PET/CT ASAP and then can decide on best mode of treatment.  If metastatic disease suspected will not do surgery and plan for chemoradiation.  The patient's diagnosis, an outline of the further diagnostic and laboratory studies which will be required, the recommendation, and alternatives were discussed.  All questions were answered to the patient's satisfaction.  A total of 60 minutes were spent with the patient/family today; 40 % was spent in education, counseling and coordination of care for cervical cancer.    Mellody Drown, MD    CC:  Rubie Maid, [place],  [address] [address] [phone]

## 2019-08-30 NOTE — Progress Notes (Signed)
Pt has had this problem for 5 years. She went to a doctor and got med and stopped bleeding for 3-4 years. July 2020 she got covid and after that she started bleeding again. She went to [facility] and they were going to refer her inoct 2020 and never got referred. 06/26/2019 went back to clinic and was send to see dr cherry. After she had u/s she has not bled since then.

## 2019-08-30 NOTE — Patient Instructions (Signed)
Tomografa por emisin de positrones (TEP) PET Scan Una TEP (tomografa por emisin de positrones) es un estudio que crea imgenes del interior del cuerpo. Para realizar el estudio, se inyecta una pequea cantidad de material radiactivo en una vena. Un tomgrafo especial luego toma imgenes del cuerpo. Las imgenes creadas durante una TEP pueden utilizarse para estudiar enfermedades, Risk manager. Los colores y el brillo de las imgenes muestran diferentes niveles de funcionamiento de los rganos y tejidos. Por ejemplo, el tejido canceroso aparece ms brillante que el tejido normal en la imagen de una tomografa por emisin de positrones. Informe al mdico acerca de lo siguiente:  Cualquier alergia que tenga.  Todos los [employer], incluidos vitaminas, hierbas, gotas oftlmicas, cremas y medicamentos de venta libre.  Cualquier trastorno de la sangre que tenga.  Cirugas previas a las que se someti.  Cualquier afeccin mdica que tenga.  Si le teme a los espacios cerrados (tiene claustrofobia). Si la claustrofobia es un problema, generalmente puede aliviarse con un medicamento que ayuda a Nurse, children's (sedante) o un medicamento para tratar la ansiedad.  Si tiene dificultades para Public affairs consultant quieto durante perodos prolongados.  Si est embarazada o podra estarlo. Cules son los riesgos? Por lo general, se trata de un estudio seguro. Sin embargo, pueden ocurrir complicaciones, por ejemplo:  Sangrado, dolor o hinchazn en el sitio de la inyeccin.  Reacciones alrgicas al material radiactivo. Esto es poco frecuente. Qu ocurre antes del procedimiento?  No coma ni beba nada despus de la medianoche anterior al procedimiento o segn le haya indicado su mdico.  Tome los medicamentos solamente como se lo haya indicado el mdico.  Infrmele al mdico si est embarazada o amamantando.  Si tiene diabetes, solicite a su mdico pautas para la alimentacin a fin de Astronomer de azcar en la sangre (glucosa) el da del [place]. Qu ocurre durante el procedimiento?  Le colocarn una va intravenosa en una de las venas.  Le inyectarn una pequea cantidad de material radiactivo en una vena.  Deber esperar de 30 a 60 minutos despus de la inyeccin. Esto permite que Agricultural engineer se distribuya a travs del cuerpo.  Se recostar sobre una tabla [place], que se desplazar a travs de la parte central de una mquina que toma imgenes, que se parece a un tomgrafo.  Se tomarn imgenes del cuerpo. La mquina tardar entre 30 y 73 minutos en reproducir las imgenes. Deber Public affairs consultant muy quieto durante ese [place]. Qu ocurre despus del procedimiento?   Consulte a su mdico o pregunte en el departamento donde se realiza la prueba acerca de lo siguiente: ? Cundo estarn disponibles mis resultados? ? Cmo obtendr mis resultados? ? Cules son mis opciones de tratamiento? ? Irish Elders pruebas necesito? ? Cules son los prximos pasos que debo seguir?  Puede retomar su dieta y las actividades habituales.  Beba entre 6 y 8 vasos de agua despus del estudio para Chief of Staff radiactivo de su cuerpo. Beba suficiente lquido como para Theatre manager la orina de color amarillo plido. Resumen  Una PET es un estudio que crea imgenes del interior del cuerpo. PET son las siglas en ingls que designan la "tomografa por emisin de positrones".  Para realizar [employer], se inyecta una pequea dosis de material radiactivo inocuo en una vena. El material se distribuye por el cuerpo en el trmino de 30 a 60 minutos.  Mientras est acostado y permanezca muy [place], se desplazar a travs de una mquina que toma imgenes  de su cuerpo. Esto llevar alrededor de 30 a 35minutos.  Los colores y el brillo de las imgenes muestran diferentes niveles de funcionamiento de los rganos y tejidos. Por ejemplo, el tejido canceroso aparece ms brillante que el tejido  normal en la imagen de una tomografa por emisin de positrones. Esta informacin no tiene Marine scientist el consejo del mdico. Asegrese de hacerle al mdico cualquier pregunta que tenga. Document Revised: 06/29/2017 Document Reviewed: 06/29/2017 Elsevier Patient Education  [place].

## 2019-08-30 NOTE — Progress Notes (Signed)
PET scan has been scheduled for 5/12 at 0830 with [facility] of 0800 [facility]. Went over instructions with daughter, [name] and Ms. Sosa. Printed instructions as well in [place]. [place] and requested that CT angio neck be rescheduled and approved with insurance. Central scheduling would not allow our clinic to reschedule since we did not order. Asked for call to confirm that this has been scheduled for her.

## 2019-09-01 ENCOUNTER — Other Ambulatory Visit: Payer: Self-pay

## 2019-09-06 ENCOUNTER — Other Ambulatory Visit: Payer: Self-pay | Admitting: Physician Assistant

## 2019-09-06 ENCOUNTER — Ambulatory Visit
Admission: RE | Admit: 2019-09-06 | Discharge: 2019-09-06 | Disposition: A | Discharge status: Home / Self Care | Payer: BC Managed Care – PPO | Source: Ambulatory Visit | Attending: Obstetrics and Gynecology | Admitting: Obstetrics and Gynecology

## 2019-09-06 ENCOUNTER — Other Ambulatory Visit: Payer: BC Managed Care – PPO

## 2019-09-06 ENCOUNTER — Other Ambulatory Visit: Payer: Self-pay

## 2019-09-06 DIAGNOSIS — R221 Localized swelling, mass and lump, neck: Secondary | ICD-10-CM

## 2019-09-06 DIAGNOSIS — C530 Malignant neoplasm of endocervix: Secondary | ICD-10-CM | POA: Insufficient documentation

## 2019-09-06 LAB — GLUCOSE, CAPILLARY: Glucose-Capillary: 98 mg/dL (ref 70–99)

## 2019-09-06 MED ORDER — FLUDEOXYGLUCOSE F - 18 (FDG) INJECTION
9.6000 | Freq: Once | INTRAVENOUS | Status: AC | PRN
  Administered 2019-09-06: 9.78 via INTRAVENOUS

## 2019-09-07 ENCOUNTER — Other Ambulatory Visit: Payer: Self-pay

## 2019-09-07 DIAGNOSIS — C530 Malignant neoplasm of endocervix: Secondary | ICD-10-CM

## 2019-09-07 NOTE — Progress Notes (Signed)
Tumor Board Documentation  Jermia Thiede was presented by Mariea Clonts RN at our Tumor Board on 09/06/2019, which included representatives from medical oncology, radiation oncology, surgical oncology, pathology, navigation, pharmacy.  Jill Rodriguez currently presents as a current patient, for [place], for new positive pathology with history of the following treatments: none.  Pathology: Endometrium biopsy 08-16-19. Poorly differentiated squamous cell carcinoma.  Additionally, we reviewed previous medical and familial history, history of present illness, and recent lab results along with all available histopathologic and imaging studies. The tumor board considered available treatment options and made the following recommendations: Concurrent chemo-radiation therapy Dr. Fransisca Connors has reviewed PET scan and discussed with Dr. Theora Gianotti about best plan of treatment: primary chemo with carbo/taxol/bev vs chemoradiation with weekly cisplatin.  They would like her to be treated with curative intent with pelvic radiation and weekly cisplatin and for the radiation field to include both pelvis and aortic lymph node areas.  The following procedures/referrals were also placed: Referrals to medical and radiation oncology.  Clinical Trial Status: not discussed   Staging used: To be determined(Clinical stage 1B1 on exam prior to PET. Staging to be updated following staging PET.)  National site-specific guidelines   were discussed with respect to the case.  Tumor board is a meeting of clinicians from various specialty areas who evaluate and discuss patients for whom a multidisciplinary approach is being considered. Final determinations in the plan of care are those of the provider(s). The responsibility for follow up of recommendations given during tumor board is that of the provider.   Today's extended care, comprehensive team conference, Kynsey was not present for the discussion and was not examined.    Multidisciplinary Tumor Board is a multidisciplinary case peer review process.  Decisions discussed in the Multidisciplinary Tumor Board reflect the opinions of the specialists present at the conference without having examined the patient.  Ultimately, treatment and diagnostic decisions rest with the primary provider(s) and the patient.

## 2019-09-11 ENCOUNTER — Other Ambulatory Visit: Payer: Self-pay

## 2019-09-11 ENCOUNTER — Encounter: Payer: Self-pay | Admitting: Oncology

## 2019-09-11 ENCOUNTER — Inpatient Hospital Stay (HOSPITAL_BASED_OUTPATIENT_CLINIC_OR_DEPARTMENT_OTHER): Payer: BC Managed Care – PPO | Admitting: Oncology

## 2019-09-11 VITALS — BP 142/65 | HR 91 | Temp 97.3°F | Resp 18 | Wt 187.6 lb

## 2019-09-11 DIAGNOSIS — Z8616 Personal history of COVID-19: Secondary | ICD-10-CM | POA: Diagnosis not present

## 2019-09-11 DIAGNOSIS — C539 Malignant neoplasm of cervix uteri, unspecified: Secondary | ICD-10-CM | POA: Diagnosis not present

## 2019-09-11 DIAGNOSIS — C538 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 uteri: Secondary | ICD-10-CM | POA: Diagnosis not present

## 2019-09-11 DIAGNOSIS — Z7189 Other specified counseling: Secondary | ICD-10-CM

## 2019-09-11 DIAGNOSIS — Z780 Asymptomatic menopausal state: Secondary | ICD-10-CM | POA: Diagnosis not present

## 2019-09-11 NOTE — Progress Notes (Signed)
Patient here to establish care. Pt complains of pain to lower abdomen that radiates to back. Pain just started about 5 days ago and it has gotten worse.

## 2019-09-11 NOTE — Progress Notes (Signed)
Hematology/Oncology Consult note [facility] Telephone:([phone] [phone]   Patient Care Team: Crissie Figures, Hershal Coria as PCP - General (Physician Assistant) Clent Jacks, RN as Oncology Nurse Navigator  REFERRING PROVIDER: [facility]  CHIEF COMPLAINTS/REASON FOR VISIT:  Evaluation of cervical cancer.   HISTORY OF PRESENTING ILLNESS:   Jill Rodriguez is a  59 y.o.  female with PMH listed below was seen in consultation at the request of  [facility]  for evaluation of cervical cancer.   Patient is G3, P3 Spanish-speaking postmenopausal female who initially presented with vaginal bleeding.  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  No intervention seen.  08/23/2019, pelvis ultrasound showed endometrium measures 8 mm.  Echogenic endometrial microcalcification seen throughout endometrium.  Hypoechoic area in the fundal aspect of the endometrium measures 0.8 x 1.1 x 2.3 cm Survey of the adnexa showed no adnexal masses.  Patient has history of prominent right neck pelvis and CT angio neck has been ordered. Patient was seen by Dr. Fransisca Connors, clinically the tumor is 3 cm and replaced whole cervix. 09/06/2019 PET scan showed hypermetabolic mass involving the cervix and the lower uterine segment consistent with known cervical cancer. Multiple small hypermetabolic retroperitoneal and pelvic lymph node bilaterally consistent with nodal metastasis. She also has small hypermetabolic lymph nodes in the left superior mediastinum and left supraclavicular regions are also suspicious for nodal metastasis.  Hepatic steatosis no cholelithiasis noted.  Today patient was accompanied by daughter to discuss management plan. Patient reports  lower abdominal pain.  Vaginal bleeding has stopped since the biopsy.  Review of Systems  Constitutional: Negative for appetite change, chills, fatigue and fever.  HENT:   Negative for hearing loss and voice change.   Eyes: Negative for eye problems.  Respiratory: Negative for chest tightness and cough.   Cardiovascular: Negative for chest pain.  Gastrointestinal: Positive for abdominal pain. Negative for abdominal distention and blood in stool.  Endocrine: Negative for hot flashes.  Genitourinary: Positive for vaginal bleeding. Negative for difficulty urinating and frequency.   Musculoskeletal: Negative for arthralgias.  Skin: Negative for itching and rash.  Neurological: Negative for extremity weakness.  Hematological: Negative for adenopathy.  Psychiatric/Behavioral: Negative for confusion.    MEDICAL HISTORY:  Past Medical History:  Diagnosis Date  . COVID-19 10/2018  . No pertinent past medical history     SURGICAL HISTORY: Past Surgical History:  Procedure Laterality Date  . no surgical history      SOCIAL HISTORY: Social History   Socioeconomic History  . Marital status: Married    Spouse name: Not on file  . Number of children: Not on file  . Years of education: Not on file  . Highest education level: Not on file  Occupational History  . Not on file  Tobacco Use  . Smoking status: Never Smoker  . Smokeless tobacco: Never Used  Substance and Sexual Activity  . Alcohol use: Never  . Drug use: Never  . Sexual activity: Yes    Birth control/protection: None  Other Topics Concern  . Not on file  Social History Narrative  . Not on file   Social Determinants of Health   Financial Resource Strain:   . Difficulty of Paying Living Expenses:   Food Insecurity:   . Worried About [employer] of  Food in the Last Year:   . [place] in the Last Year:   Transportation Needs:   . Film/video editor (Medical):   [name] Kitchen Lack of Transportation (Non-Medical):    Physical Activity:   . Days of Exercise per Week:   . Minutes of Exercise per Session:   Stress:   . Feeling of Stress :   Social Connections:   . Frequency of Communication with Friends and Family:   . Frequency of Social Gatherings with Friends and Family:   . Attends Religious Services:   . Active Member of Clubs or Organizations:   . Attends Archivist Meetings:   [name] Kitchen Marital Status:   Intimate Partner Violence:   . Fear of Current or Ex-Partner:   . Emotionally Abused:   [name] Kitchen Physically Abused:   . Sexually Abused:     FAMILY HISTORY: Family History  Problem Relation Age of Onset  . Blindness Mother   . Glaucoma Mother   . Diabetes Father   . Diabetes Brother     ALLERGIES:  has No Known Allergies.  MEDICATIONS:  No current outpatient medications on file.   No current facility-administered medications for this visit.     PHYSICAL EXAMINATION: ECOG PERFORMANCE STATUS: 1 - Symptomatic but completely ambulatory Vitals:   09/11/19 1541  BP: (!) 142/65  Pulse: 91  Resp: 18  Temp: (!) 97.3 F (36.3 C)   Filed Weights   09/11/19 1541  Weight: 187 lb 9.6 oz (85.1 kg)    Physical Exam Constitutional:      General: She is not in acute distress. HENT:     Head: Normocephalic and atraumatic.  Eyes:     General: No scleral icterus. Cardiovascular:     Rate and Rhythm: Normal rate and regular rhythm.     Heart sounds: Normal heart sounds.  Pulmonary:     Effort: Pulmonary effort is normal. No respiratory distress.     Breath sounds: No wheezing.  Abdominal:     General: Bowel sounds are normal. There is no distension.     Palpations: Abdomen is soft.  Musculoskeletal:        General: No deformity. Normal range of motion.     Cervical back: Normal range of motion and neck supple.  Skin:    General: Skin is warm and dry.     Findings: No erythema or rash.  Neurological:     Mental Status: She is alert and oriented to person, place, and time.  Mental status is at baseline.     Cranial Nerves: No cranial nerve deficit.     Coordination: Coordination normal.  Psychiatric:        Mood and Affect: Mood normal.     LABORATORY DATA:  I have reviewed the data as listed Lab Results  Component Value Date   WBC 5.3 08/23/2019   HGB 13.8 08/23/2019   HCT 42.0 08/23/2019   MCV 91 08/23/2019   PLT 254 08/23/2019   Recent Labs    08/23/19 0915  NA 141  K 4.0  CL 104  CO2 24  GLUCOSE 100*  BUN 14  CREATININE 0.64  CALCIUM 9.2  GFRNONAA 99  GFRAA 114  PROT 7.6  ALBUMIN 4.5  AST 30  ALT 31  ALKPHOS 100  BILITOT 0.4   Iron/TIBC/Ferritin/ %Sat No results found for: IRON, TIBC, FERRITIN, IRONPCTSAT    RADIOGRAPHIC STUDIES: I have personally reviewed the radiological images as listed and agreed with the  findings in the report. US PELVIS TRANSVAGINAL NON-OB (TV ONLY)  Result Date: 08/24/2019 Patient Name: Jill Rodriguez DOB: 10-25-1960 MRN: [id number] ULTRASOUND REPORT Location: [facility] Date of Service: 08/23/2019 Indications:AUB Findings: The uterus is anteverted and measures 6.1 x 3.4 x 4.9 cm. Echo texture is homogenous without evidence of focal masses. Within the uterus are multiple suspected fibroids measuring: The Endometrium measures 8 mm. Echogenic endometrial microcalcifications seen through -out endometrium. An hypoechoic area seen in the fundal aspect of the endometrium measuring 0.8 x 1.1 x 2.3 cm Right Ovary measures 1.6 x 1.1 x 1.2  cm. It is normal in appearance. Left Ovary measures 2.2 x 1.5 x 1.7 cm. It is normal in appearance. Survey of the adnexa demonstrates no adnexal masses. There is no free fluid in the cul de sac. Impression: 1. Endometrial microcalcifications as described above with a hypoechoic area most likely retain blood in the fundal aspect of the endometrium. 2. Survey of the adnexa demonstrates no adnexal masses. Recommendations: 1.Clinical correlation with the patient's History and  Physical Exam. Jenine M. Albertine Grates    RDMS I have reviewed this study and agree with documented findings. Rubie Maid, MD [facility]  NM PET Image Initial (PI) Skull Base To Thigh  Result Date: 09/06/2019 CLINICAL DATA:  Initial treatment strategy for recently diagnosed endocervical cancer. EXAM: NUCLEAR MEDICINE PET SKULL BASE TO THIGH TECHNIQUE: 9.78 mCi F-18 FDG was injected intravenously. Full-ring PET imaging was performed from the skull base to thigh after the radiotracer. CT data was obtained and used for attenuation correction and anatomic localization. Fasting blood glucose: 98 mg/dl COMPARISON:  None. FINDINGS: Mediastinal blood pool activity: SUV max 2.2 NECK: There is a 7 mm hypermetabolic left supraclavicular node on image 66/3 (SUV max 3.8). No other hypermetabolic cervical lymph nodes are identified.There are no lesions of the pharyngeal mucosal space. Symmetric activity within the lymphoid tissue of Waldeyer's ring, likely physiologic. Incidental CT findings: none CHEST: There are several small hypermetabolic left paratracheal and prevascular space lymph nodes. 6 mm left paratracheal node on image 71/3 has an SUV max of 5.2. Small prevascular node has an SUV max of 5.2. No hypermetabolic axillary lymph nodes. No suspicious pulmonary activity or nodularity. Incidental CT findings: Mild aortic atherosclerosis. Small calcified right infrahilar lymph node. ABDOMEN/PELVIS: There is no hypermetabolic activity within the liver, adrenal glands, spleen or pancreas. There is a hypermetabolic mass involving the cervix and lower uterine segment (SUV max 12.5). No obvious parametrial extension or bladder involvement. There are multiple small hypermetabolic retroperitoneal and pelvic sidewall lymph nodes bilaterally. Representative nodes include an 8 mm right common iliac node on image 188/3 (SUV max 6.9), a right pelvic sidewall node measuring 14 mm on image 217/3 (SUV max 4.6) and a left pelvic  sidewall node measuring 12 mm on image 218/3 (SUV max 3.44). The highest of these intra-abdominal lymph nodes are just below the renal hila. Incidental CT findings: Diffuse hepatic steatosis and calcified gallstones are noted. No evidence of urinary tract calculus or hydronephrosis. SKELETON: There is no hypermetabolic activity to suggest osseous metastatic disease. Incidental CT findings: none IMPRESSION: 1. Hypermetabolic mass involving the cervix and lower uterine segment consistent with known cervical cancer. 2. Multiple small hypermetabolic retroperitoneal and pelvic lymph nodes bilaterally consistent with nodal metastases. Small hypermetabolic lymph nodes in the left superior mediastinum and left supraclavicular regions are also suspicious for nodal metastases. 3. Hepatic steatosis and cholelithiasis noted. Electronically Signed   By: Caryl Comes.D.  On: 09/06/2019 16:07      ASSESSMENT & PLAN:  1. 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 ([place])   2. Goals of care, counseling/discussion    Images were independent reviewed by me and discussed with patient. Pathology reports were discussed. Patient's case was discussed in tumor board prior to the PET scan Also reviewed Dr. Blake Divine recommendation after he reviewed PET scan.  Patient speaks Spanish dialect and she does not understand our [place] interpreter.  She signed a waiver for interpreter and prefers to have her daughter to interpret for her. #Discussed with patient and her daughter that patient has at least stage IIIc cervical cancer given that patient has retroperitoneal and pelvic lymph nodes involvement.  Small hypermetabolic paratracheal and supraclavicular nodes, clinical significance unknown.  Possible nodal metastasis versus reactive.  Nodes are very small and yield of biopsy is extremely low. I agree with Dr. Fransisca Connors that given the benefit of doubt, recommend patient to proceed with concurrent chemotherapy with  cisplatin weekly along with radiation with curative intent.  Need to close follow-up chest lymphadenopathy.  Chemotherapy education was provided.  We had discussed the composition of chemotherapy regimen, length of chemo cycle, duration of treatment and the time to assess response to treatment.    I explained to the patient the risks and benefits of chemotherapy cisplatin including all but not limited to hair loss, mouth sore, nausea, vomiting, diarrhea, low blood counts, bleeding, kidney failure, hearing loss, neuropathy and risk of life threatening infection and even death, secondary malignancy etc.  . Patient voices understanding and willing to proceed chemotherapy.   # Chemotherapy education; I explained to patient that Mediport can be an option but not required for her treatments..  Referred to radiation oncology. Baseline hearing testing.  Supportive care measures are necessary for patient well-being and will be provided as necessary. We spent sufficient time to discuss many aspect of care, questions were answered to patient's satisfaction.   Orders Placed This Encounter  Procedures  . Ambulatory referral to ENT    Referral Priority:   Routine    Referral Type:   Consultation    Referral Reason:   Specialty Services Required    Requested Specialty:   Otolaryngology    Number of Visits Requested:   1    All questions were answered. The patient knows to call the clinic with any problems questions or concerns.  Return of visit: To be determined Thank you for this kind referral and the opportunity to participate in the care of this patient. A copy of today's note is routed to referring provider    Earlie Server, MD, PhD Hematology Oncology [facility] Pager- [phone] 09/11/2019

## 2019-09-13 ENCOUNTER — Other Ambulatory Visit: Payer: Self-pay

## 2019-09-13 ENCOUNTER — Inpatient Hospital Stay: Payer: BC Managed Care – PPO

## 2019-09-13 ENCOUNTER — Ambulatory Visit
Admission: RE | Admit: 2019-09-13 | Discharge: 2019-09-13 | Disposition: A | Discharge status: Home / Self Care | Payer: BC Managed Care – PPO | Source: Ambulatory Visit | Attending: Physician Assistant | Admitting: Physician Assistant

## 2019-09-13 DIAGNOSIS — R221 Localized swelling, mass and lump, neck: Secondary | ICD-10-CM | POA: Diagnosis present

## 2019-09-13 HISTORY — DX: Malignant (primary) neoplasm, unspecified: C80.1

## 2019-09-13 MED ORDER — IOHEXOL 350 MG/ML SOLN
75.0000 mL | Freq: Once | INTRAVENOUS | Status: AC | PRN
  Administered 2019-09-13: 75 mL via INTRAVENOUS

## 2019-09-14 NOTE — Progress Notes (Signed)
Met with Ms. Jill Rodriguez and her daughter, [name]. She has concerns regarding being able to work during chemoradiation. She works full time and is the main income source for her family. Her spouse is on disability. She has sent her FMLA papers to Dr. Marcelline Mates. I have requested that since she will be on treatment we can complete these for her. [name] was provided fax number to get them sent over. She is interested in disability. I encouraged her to check with human resources to ensure that she has not purchased STD, as she is not sure. She can also meet with our PSN for further assistance. She is aware that she can work during chemoradiation but it may be difficult. We have gotten her CT angiogram rescheduled for her pulsatile right neck vessel. This is scheduled for 5-19. She has her appointment already arranged for radiation consult. Referral sent for baseline hearing test. Will continue to follow and assess needs.

## 2019-09-19 ENCOUNTER — Encounter: Payer: Self-pay | Admitting: Radiation Oncology

## 2019-09-19 ENCOUNTER — Other Ambulatory Visit: Payer: Self-pay

## 2019-09-19 ENCOUNTER — Ambulatory Visit
Admission: RE | Admit: 2019-09-19 | Discharge: 2019-09-19 | Disposition: A | Discharge status: Home / Self Care | Payer: BC Managed Care – PPO | Source: Ambulatory Visit | Attending: Radiation Oncology | Admitting: Radiation Oncology

## 2019-09-19 VITALS — BP 152/89 | HR 84 | Temp 96.4°F | Resp 16 | Wt 187.1 lb

## 2019-09-19 DIAGNOSIS — C530 Malignant neoplasm of endocervix: Secondary | ICD-10-CM

## 2019-09-19 DIAGNOSIS — C538 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 uteri: Secondary | ICD-10-CM | POA: Insufficient documentation

## 2019-09-19 DIAGNOSIS — R599 Enlarged lymph nodes, unspecified: Secondary | ICD-10-CM | POA: Insufficient documentation

## 2019-09-19 NOTE — Patient Instructions (Signed)
Cisplatin injection Qu es este medicamento? El CISPLATINO es un agente quimioteraputico. Este medicamento acta sobre las clulas que se dividen rpidamente, como las clulas cancergenas, y finalmente provoca la muerte de estas clulas. Se utiliza en el tratamiento de muchos tipos de cncer, como los cnceres de vejiga, ovarios y testculos. Este medicamento puede ser utilizado para otros usos; si tiene alguna pregunta consulte con su proveedor de atencin mdica o con su farmacutico. MARCAS COMUNES: Platinol, Platinol -AQ Qu le debo informar a mi profesional de la salud antes de tomar este medicamento? Necesita saber si usted presenta alguno de los siguientes problemas o situaciones:  trastornos sanguneos  problemas auditivos  enfermedad renal  radioterapia reciente o continuada  una reaccin alrgica o inusual al cisplatino, al carboplatino, a otros agentes quimioteraputicos, a otros medicamentos, alimentos, colorantes o conservantes  si est embarazada o buscando quedar embarazada  si est amamantando a un beb Cmo debo [employer]? Este medicamento se administra mediante infusin por va intravenosa. Lo administra un profesional de la salud calificado en un hospital o en un entorno clnico. Hable con su pediatra para informarse acerca del uso de este medicamento en nios. Puede requerir atencin especial. Sobredosis: Pngase en contacto inmediatamente con un centro toxicolgico o una sala de urgencia si usted cree que haya tomado demasiado medicamento. ATENCIN: [employer] es solo para usted. No comparta este medicamento con nadie. Qu sucede si me olvido de una dosis? Es importante no olvidar ninguna dosis. Informe a su mdico o a su profesional de la salud si no puede asistir a Photographer. Qu puede interactuar con este medicamento?  dofetilida  foscarnet  medicamentos para convulsiones  medicamentos para incrementar los conteos sanguneos,  tales como filgrastim, pegfilgrastim, sargramostim  probenecid  piridoxina usado con altretamina  rituximab  ciertos antibiticos, tales como amicacina, gentamicina, neomicina, polimixina B, estreptomicina, tobramicina  sulfinpirazona  vacunas  zalcitabina Consulte a su mdico o a su profesional de la salud antes de tomar cualquiera de los siguientes medicamentos:  acetaminofeno  aspirina  ibuprofeno  quetoprofeno  naproxeno Puede ser que esta lista no menciona todas las posibles interacciones. Informe a su profesional de [employer]	[place] de [employer] productos a base de hierbas, medicamentos de [place] o suplementos nutritivos que est tomando. Si usted fuma, consume bebidas alcohlicas o si utiliza drogas ilegales, indqueselo tambin a su profesional de [employer]	[place]. Algunas sustancias pueden interactuar con su medicamento. A qu debo estar atento al usar [employer]? Se supervisar su condicin atentamente mientras reciba este medicamento. Tendr que hacerse anlisis de sangre peridicos mientras est tomando este medicamento. Este medicamento puede hacerle sentir un Nurse, mental health. Esto es normal ya que la quimioterapia afecta tanto a las clulas sanas como a las clulas cancerosas. Si presenta alguno de los [employer], infrmelos. Sin embargo, contine con el tratamiento aun si se siente enfermo, a menos que su mdico le indique que lo suspenda. En algunos casos, podr recibir [employer] para ayudarle con los efectos secundarios. Siga las instrucciones para usarlos. Consulte a su mdico o a su profesional de la salud por asesoramiento si tiene fiebre, escalofros, dolor de garganta o cualquier otro sntoma de resfro o gripe. No se trate usted mismo. Este medicamento puede reducir la capacidad del cuerpo para combatir infecciones. Trate de no acercarse a personas que estn enfermas. [employer] puede aumentar el riesgo de magulladuras o sangrado.  Consulte a su mdico o a su profesional de la salud si observa sangrados inusuales. Proceda  con cuidado al cepillar sus dientes, usar hilo dental o Risk manager palillos para los dientes, ya que puede contraer una infeccin o Therapist, art con mayor facilidad. Si se somete a algn tratamiento dental, informe a su dentista que est [employer]. Evite tomar productos que contienen aspirina, acetaminofeno, ibuprofeno, naproxeno o quetoprofeno a menos que as lo indique su mdico. Estos productos pueden disimular la fiebre. No se debe quedar embarazada mientras recibe este medicamento. Las mujeres deben informar a su mdico si estn buscando quedar embarazadas o si creen que estn embarazadas. Existe la posibilidad de efectos secundarios graves a un beb sin nacer. Para ms informacin hable con su profesional de la salud o su farmacutico. No debe Economist a un beb mientras est usando este medicamento. Mientras recibe Coca[employer]Cola, beba lquido como le haya indicado. Esto ayudar a [employer]. Si tiene diarrea, llame a su mdico o a su profesional de [employer]	[place]. No se trate usted mismo. Qu efectos secundarios puedo tener al [employer] este medicamento? Efectos secundarios que debe informar a su mdico o a Barrister's clerk de la salud tan pronto como sea posible:  Chief of Staff como erupcin cutnea, picazn o urticarias, hinchazn de la cara, labios o lengua  signos de infeccin - fiebre o escalofros, tos, dolor de garganta, dolor o dificultad para orinar  signos de reduccin de plaquetas o sangrado - magulladuras, puntos rojos en la piel, heces de color oscuro o con aspecto alquitranado, sangrado por la nariz  signos de reduccin de glbulos rojos - cansancio o debilidad inusual, desmayos, sensacin de [employer]  problemas respiratorios  cambios de audicin  dolor de gota  conteos sanguneos bajos - este medicamento puede reducir la cantidad de glbulos blancos, glbulos rojos  y Art gallery manager. Su riesgo de infeccin y [place].  nuseas, vmito  dolor, hinchazn, enrojecimiento o irritacin en el lugar de la inyeccin  dolor, hormigueo, entumecimiento de manos o pies  problemas de coordinacin, de movimiento  dificultad para orinar o cambios en el volumen de orina Efectos secundarios que, por lo general, no requieren atencin mdica (debe informarlos a su mdico o a su profesional de la salud si persisten o si son molestos):  cambios en la visin  prdida del apetito  sabor metlico en la boca o cambios en el sentido del gusto Puede ser que esta lista no menciona todos los posibles efectos secundarios. Comunquese a su mdico por asesoramiento mdico [employer]. Usted puede informar los efectos secundarios a la FDA por telfono al 1-800-FDA-1088. Dnde debo guardar mi medicina? Este medicamento se administra en hospitales o clnicas y no necesitar guardarlo en su domicilio. ATENCIN: Este folleto es un resumen. Puede ser que no cubra toda la posible informacin. Si usted tiene preguntas acerca de esta medicina, consulte con su mdico, su farmacutico o su profesional de Technical sales engineer.  2020 Elsevier/Gold Standard (2014-06-05 00:00:00) Cisplatin injection What is this medicine? CISPLATIN (SIS pla tin) is a chemotherapy drug. It targets fast dividing cells, like cancer cells, and causes these cells to die. This medicine is used to treat many types of cancer like bladder, ovarian, and testicular cancers. This medicine may be used for other purposes; ask your health care provider or pharmacist if you have questions. COMMON BRAND NAME(S): Platinol, Platinol -AQ What should I tell my health care provider before I take this medicine? They need to know if you have any of these conditions:  eye disease, vision problems  hearing problems  kidney disease  low blood counts, like white cells, platelets, or red blood cells  tingling of  the fingers or toes, or other nerve disorder  an unusual or allergic reaction to cisplatin, carboplatin, oxaliplatin, other medicines, foods, dyes, or preservatives  pregnant or trying to get pregnant  breast-feeding How should I use this medicine? This drug is given as an infusion into a vein. It is administered in a hospital or clinic by a specially trained health care professional. Talk to your pediatrician regarding the use of this medicine in children. Special care may be needed. Overdosage: If you think you have taken too much of this medicine contact a poison control center or emergency room at once. NOTE: This medicine is only for you. Do not share this medicine with others. What if I miss a dose? It is important not to miss a dose. Call your doctor or health care professional if you are unable to keep an appointment. What may interact with this medicine? This medicine may interact with the following medications:  foscarnet  certain antibiotics like amikacin, gentamicin, neomycin, polymyxin B, streptomycin, tobramycin, vancomycin This list may not describe all possible interactions. Give your health care provider a list of all the medicines, herbs, non-prescription drugs, or dietary supplements you use. Also tell them if you smoke, drink alcohol, or use illegal drugs. Some items may interact with your medicine. What should I watch for while using this medicine? Your condition will be monitored carefully while you are receiving this medicine. You will need important blood work done while you are taking this medicine. This drug may make you feel generally unwell. This is not uncommon, as chemotherapy can affect healthy cells as well as cancer cells. Report any side effects. Continue your course of treatment even though you feel ill unless your doctor tells you to stop. This medicine may increase your risk of getting an infection. Call your healthcare professional for advice if you get a  fever, chills, or sore throat, or other symptoms of a cold or flu. Do not treat yourself. Try to avoid being around people who are sick. Avoid taking medicines that contain aspirin, acetaminophen, ibuprofen, naproxen, or ketoprofen unless instructed by your healthcare professional. These medicines may hide a fever. This medicine may increase your risk to bruise or bleed. Call your doctor or health care professional if you notice any unusual bleeding. Be careful brushing and flossing your teeth or using a toothpick because you may get an infection or bleed more easily. If you have any dental work done, tell your dentist you are receiving this medicine. Do not become pregnant while taking this medicine or for 14 months after stopping it. Women should inform their healthcare professional if they wish to become pregnant or think they might be pregnant. Men should not father a child while taking this medicine and for 11 months after stopping it. There is potential for serious side effects to an unborn child. Talk to your healthcare professional for more information. Do not breast-feed an infant while taking this medicine. This medicine has caused ovarian failure in some women. This medicine may make it more difficult to get pregnant. Talk to your healthcare professional if you are concerned about your fertility. This medicine has caused decreased sperm counts in some men. This may make it more difficult to father a child. Talk to your healthcare professional if you are concerned about your fertility. Drink fluids as directed while you are taking this medicine. This will help protect your kidneys. Call  your doctor or health care professional if you get diarrhea. Do not treat yourself. What side effects may I notice from receiving this medicine? Side effects that you should report to your doctor or health care professional as soon as possible:  allergic reactions like skin rash, itching or hives, swelling of  the face, lips, or tongue  blurred vision  changes in vision  decreased hearing or ringing of the ears  nausea, vomiting  pain, redness, or irritation at site where injected  pain, tingling, numbness in the hands or feet  signs and symptoms of bleeding such as bloody or black, tarry stools; red or dark brown urine; spitting up blood or brown material that looks like coffee grounds; red spots on the skin; unusual bruising or bleeding from the eyes, gums, or nose  signs and symptoms of infection like fever; chills; cough; sore throat; pain or trouble passing urine  signs and symptoms of kidney injury like trouble passing urine or change in the amount of urine  signs and symptoms of low red blood cells or anemia such as unusually weak or tired; feeling faint or lightheaded; falls; breathing problems Side effects that usually do not require medical attention (report to your doctor or health care professional if they continue or are bothersome):  loss of appetite  mouth sores  muscle cramps This list may not describe all possible side effects. Call your doctor for medical advice about side effects. You may report side effects to FDA at 1-800-FDA-1088. Where should I keep my medicine? This drug is given in a hospital or clinic and will not be stored at home. NOTE: This sheet is a summary. It may not cover all possible information. If you have questions about this medicine, talk to your doctor, pharmacist, or health care provider.  2020 Elsevier/Gold Standard (2018-04-08 15:59:17)

## 2019-09-19 NOTE — Consult Note (Signed)
NEW PATIENT EVALUATION  Name: Jill Rodriguez  MRN: [id number]  Date:   09/19/2019     DOB: [birth year]   This 59 y.o. 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  REFERRING PHYSICIAN: [place]:  Chief Complaint  Patient presents with  . Cervical Cancer    DIAGNOSIS: The encounter diagnosis was Malignant neoplasm of endocervix ([place]).   PREVIOUS INVESTIGATIONS:  PET CT scan reviewed Pathology report reviewed Clinical notes reviewed  HPI: Patient is a 59 year old Spanish-speaking female accompanied by her daughter who is her interpreter.  She is gravida 3 para 3 originally presented with postmenopausal bleeding found by pelvic examination to have a friable cervix with endometrial biopsy showing poorly differentiated squamous cell carcinoma.  She was seen by Dr. Fransisca Connors found to have a 3 cm cervical mass.  PET CT scan on Aug 27, 2010 showed hypermetabolic mass involving the cervix and lower uterine segment consistent with known cervical cancer.  There were multiple small hypermetabolic retroperitoneal and pelvic lymph nodes bilaterally consistent with nodal metastasis.  She also has hypermetabolic lymph nodes in the left superior mediastinum and left supraclavicular region suspicious for nodal metastasis.  She has been seen by medical oncology and plan is for concurrent chemoradiation.  She is seen today and doing well she states the bleeding has stopped she is having some minor pelvic pain and pressure.  She specifically denies diarrhea or dysuria.  PLANNED TREATMENT REGIMEN: Concurrent chemoradiation  PAST MEDICAL HISTORY:  has a past medical history of Cancer ([place]), COVID-19 (10/2018), and No pertinent past medical history.    PAST SURGICAL HISTORY:  Past Surgical History:  Procedure Laterality Date  . no surgical history      FAMILY HISTORY: family history includes Blindness  in her mother; Diabetes in her brother and father; Glaucoma in her mother.  SOCIAL HISTORY:  reports that she has never smoked. She has never used smokeless tobacco. She reports that she does not drink alcohol or use drugs.  ALLERGIES: Patient has no known allergies.  MEDICATIONS:  No current outpatient medications on file.   No current facility-administered medications for this encounter.    ECOG PERFORMANCE STATUS:  1 - Symptomatic but completely ambulatory  REVIEW OF SYSTEMS: Patient denies any weight loss, fatigue, weakness, fever, chills or night sweats. Patient denies any loss of vision, blurred vision. Patient denies any ringing  of the ears or hearing loss. No irregular heartbeat. Patient denies heart murmur or history of fainting. Patient denies any chest pain or pain radiating to her upper extremities. Patient denies any shortness of breath, difficulty breathing at night, cough or hemoptysis. Patient denies any swelling in the lower legs. Patient denies any nausea vomiting, vomiting of blood, or coffee ground material in the vomitus. Patient denies any stomach pain. Patient states has had normal bowel movements no significant constipation or diarrhea. Patient denies any dysuria, hematuria or significant nocturia. Patient denies any problems walking, swelling in the joints or loss of balance. Patient denies any skin changes, loss of hair or loss of weight. Patient denies any excessive worrying or anxiety or significant depression. Patient denies any problems with insomnia. Patient denies excessive thirst, polyuria, polydipsia. Patient denies any swollen glands, patient denies easy bruising or easy bleeding. Patient denies any recent infections, allergies or URI. Patient "s visual fields have not changed significantly in recent time.   PHYSICAL EXAM: BP (!) 152/89 (BP Location: Left  Arm, Patient Position: Sitting, Cuff Size: Normal)   Pulse 84   Temp (!) 96.4 F (35.8 C)   Resp 16   Wt  187 lb 1.6 oz (84.9 kg)   BMI 34.67 kg/m  Well-developed well-nourished patient in NAD. HEENT reveals PERLA, EOMI, discs not visualized.  Oral cavity is clear. No oral mucosal lesions are identified. Neck is clear without evidence of cervical or supraclavicular adenopathy. Lungs are clear to A&P. Cardiac examination is essentially unremarkable with regular rate and rhythm without murmur rub or thrill. Abdomen is benign with no organomegaly or masses noted. Motor sensory and DTR levels are equal and symmetric in the upper and lower extremities. Cranial nerves II through XII are grossly intact. Proprioception is intact. No peripheral adenopathy or edema is identified. No motor or sensory levels are noted. Crude visual fields are within normal range.  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  PLAN: At this time I like to go ahead with radiation therapy with concurrent chemotherapy.  I would use IMRT radiation therapy.  I would choose IMRT to dose paint her pelvic lymph nodes.  I would treat her cervix to 5000 cGy over 5 weeks.  Would also attempt to dose pain to her hypermetabolic lymph nodes to [id number] cGy.  Remaining pelvic lymph nodes would receive 5000 cGy over 5 weeks.  Patient also will be sent for consultation to [facility] for Dr. Christel Mormon to assess the patient for possible brachytherapy.  Risks and benefits of treatment occluding increased lower urinary tract symptoms diarrhea fatigue alteration of blood count skin reaction all were discussed in detail with the patient through her daughter.  They both seem to comprehend my treatment plan well.  There will be extra effort by both professional staff as well as technical staff to coordinate and manage concurrent chemoradiation and ensuing side effects during her treatments.  I have personally  set up and ordered CT simulation for later this week.  We will use PET fusion study for treatment planning.  I would like to take this opportunity to thank you for allowing me to participate in the care of your patient.Noreene Filbert, MD

## 2019-09-20 ENCOUNTER — Telehealth: Payer: Self-pay

## 2019-09-20 ENCOUNTER — Inpatient Hospital Stay: Payer: BC Managed Care – PPO

## 2019-09-20 ENCOUNTER — Other Ambulatory Visit: Payer: Self-pay | Admitting: Oncology

## 2019-09-20 ENCOUNTER — Inpatient Hospital Stay (HOSPITAL_BASED_OUTPATIENT_CLINIC_OR_DEPARTMENT_OTHER): Payer: BC Managed Care – PPO | Admitting: Oncology

## 2019-09-20 DIAGNOSIS — Z7189 Other specified counseling: Secondary | ICD-10-CM | POA: Insufficient documentation

## 2019-09-20 DIAGNOSIS — C530 Malignant neoplasm of endocervix: Secondary | ICD-10-CM

## 2019-09-20 DIAGNOSIS — C539 Malignant neoplasm of cervix uteri, unspecified: Secondary | ICD-10-CM | POA: Diagnosis not present

## 2019-09-20 MED ORDER — PROCHLORPERAZINE MALEATE 10 MG PO TABS: 10.0000 mg | ORAL_TABLET | Freq: Four times a day (QID) | ORAL | 1 refills | Status: DC | PRN

## 2019-09-20 NOTE — Progress Notes (Signed)
START OFF PATHWAY REGIMEN - Other   [id number]:Cisplatin 40 mg/m2 IV D1 q7 Days + RT:   A cycle is every 7 days:     Cisplatin   **Always confirm dose/schedule in your pharmacy ordering system**  Patient Characteristics: Intent of Therapy: Curative Intent, Discussed with Patient 

## 2019-09-20 NOTE — Telephone Encounter (Signed)
Jill Rodriguez please schedule patient for lab/MD/cisplatin *NEW* around 6/8. I will call pt to notify her of appts and COVID testing.

## 2019-09-20 NOTE — Telephone Encounter (Addendum)
Done....  Pt has been sched for lab @ 8:15 /MD @ 8:45 / *NEW* Cisplatin @ [id number] on 6/8.

## 2019-09-20 NOTE — Progress Notes (Signed)
PSN called patient today to discuss her financial concerns.  Patient stated at this time she does not have any concerns.  Patient encouraged to call if issues arose.

## 2019-09-20 NOTE — Progress Notes (Signed)
Jill Rodriguez  [phone] [phone]  Patient Care Team: Jill Figures, PA-C as PCP - General (Physician Assistant) Jill Jacks, RN as Oncology Nurse Navigator   Name of the patient: Jill Rodriguez  [id number]  Aug 25, 1960   Date of visit: 09/20/19  Diagnosis- Cervical Cancer   Chief complaint/Reason for visit- Initial Meeting for [facility], preparing for starting chemotherapy  Heme/Onc history:  Oncology History  Cervical cancer ([place])  08/30/2019 Initial Diagnosis   Cervical cancer ([place])   09/11/2019 Cancer Staging   Staging form: Cervix Uteri, AJCC Version 9 - Clinical stage from 09/11/2019: FIGO Stage IIIC2r, calculated as Stage IIIC2 (cT1b2, cN2, cM0) - Signed by Earlie Server, MD on 09/11/2019     Interval history-  Jill Rodriguez is a 59 yo female who presents to chemo care clinic today for initial meeting in preparation for starting chemotherapy. I introduced the chemo care clinic and we discussed that the role of the clinic is to assist those who are at an increased risk of emergency room visits and/or complications during the course of chemotherapy treatment. We discussed that the increased risk takes into account factors such as age, performance status, and co-morbidities. We also discussed that for some, this might include barriers to care such as not having a primary care provider, lack of insurance/transportation, or not being able to afford medications. We discussed that the goal of the program is to help prevent unplanned ER visits and help reduce complications during chemotherapy. We do this by discussing specific risk factors to each individual and identifying ways that we can help improve these risk factors and reduce barriers to care.   ECOG FS:1 - Symptomatic but completely ambulatory  Review of systems- Review of Systems  Constitutional: Negative.  Negative for chills, fever,  malaise/fatigue and weight loss.  HENT: Negative for congestion, ear pain and tinnitus.   Eyes: Negative.  Negative for blurred vision and double vision.  Respiratory: Negative.  Negative for cough, sputum production and shortness of breath.   Cardiovascular: Negative.  Negative for chest pain, palpitations and leg swelling.  Gastrointestinal: Positive for abdominal pain. Negative for constipation, diarrhea, nausea and vomiting.  Genitourinary: Negative for dysuria, frequency and urgency.  Musculoskeletal: Negative for back pain and falls.  Skin: Negative.  Negative for rash.  Neurological: Negative.  Negative for weakness and headaches.  Endo/Heme/Allergies: Negative.  Does not bruise/bleed easily.  Psychiatric/Behavioral: Negative.  Negative for depression. The patient is not nervous/anxious and does not have insomnia.      Current treatment-concurrent chemoradiation with cisplatin  No Known Allergies  Past Medical History:  Diagnosis Date  . Cancer ([place])   . COVID-19 10/2018  . No pertinent past medical history     Past Surgical History:  Procedure Laterality Date  . no surgical history      Social History   Socioeconomic History  . Marital status: Married    Spouse name: Not on file  . Number of children: Not on file  . Years of education: Not on file  . Highest education level: Not on file  Occupational History  . Not on file  Tobacco Use  . Smoking status: Never Smoker  . Smokeless tobacco: Never Used  Substance and Sexual Activity  . Alcohol use: Never  . Drug use: Never  . Sexual activity: Yes    Birth control/protection: None  Other Topics Concern  . Not on file  Social History  Narrative  . Not on file   Social Determinants of Health   Financial Resource Strain:   . Difficulty of Paying Living Expenses:   Food Insecurity:   . Worried About Charity fundraiser in the Last Year:   . Arboriculturist in the Last Year:   Transportation Needs:   . Consulting civil engineer (Medical):   [name] Kitchen Lack of Transportation (Non-Medical):   Physical Activity:   . Days of Exercise per Week:   . Minutes of Exercise per Session:   Stress:   . Feeling of Stress :   Social Connections:   . Frequency of Communication with Friends and Family:   . Frequency of Social Gatherings with Friends and Family:   . Attends Religious Services:   . Active Member of Clubs or Organizations:   . Attends Archivist Meetings:   [name] Kitchen Marital Status:   Intimate Partner Violence:   . Fear of Current or Ex-Partner:   . Emotionally Abused:   [name] Kitchen Physically Abused:   . Sexually Abused:     Family History  Problem Relation Age of Onset  . Blindness Mother   . Glaucoma Mother   . Diabetes Father   . Diabetes Brother     No current outpatient medications on file.  Physical exam: There were no vitals filed for this visit. Physical Exam Constitutional:      Appearance: Normal appearance.  HENT:     Head: Normocephalic and atraumatic.  Eyes:     Pupils: Pupils are equal, round, and reactive to light.  Cardiovascular:     Rate and Rhythm: Normal rate and regular rhythm.     Heart sounds: Normal heart sounds. No murmur.  Pulmonary:     Effort: Pulmonary effort is normal.     Breath sounds: Normal breath sounds. No wheezing.  Abdominal:     General: Bowel sounds are normal. There is no distension.     Palpations: Abdomen is soft.     Tenderness: There is no abdominal tenderness.  Musculoskeletal:        General: Normal range of motion.     Cervical back: Normal range of motion.  Skin:    General: Skin is warm and dry.     Findings: No rash.  Neurological:     Mental Status: She is alert and oriented to person, place, and time.  Psychiatric:        Judgment: Judgment normal.      CMP Latest Ref Rng & Units 08/23/2019  Glucose 65 - 99 mg/dL 100(H)  BUN 6 - 24 mg/dL 14  Creatinine 0.57 - 1.00 mg/dL 0.64  Sodium 134 - 144 mmol/L 141  Potassium 3.5 - 5.2  mmol/L 4.0  Chloride 96 - 106 mmol/L 104  CO2 20 - 29 mmol/L 24  Calcium 8.7 - 10.2 mg/dL 9.2  Total Protein 6.0 - 8.5 g/dL 7.6  Total Bilirubin 0.0 - 1.2 mg/dL 0.4  Alkaline Phos 39 - 117 IU/L 100  AST 0 - 40 IU/L 30  ALT 0 - 32 IU/L 31   CBC Latest Ref Rng & Units 08/23/2019  WBC 3.4 - 10.8 x10E3/uL 5.3  Hemoglobin 11.1 - 15.9 g/dL 13.8  Hematocrit 34.0 - 46.6 % 42.0  Platelets 150 - 450 x10E3/uL 254    No images are attached to the encounter.  CT ANGIO NECK W OR WO CONTRAST  Result Date: 09/13/2019 CLINICAL DATA:  59 year old female with pulsatile right lateral neck mass  with bruit. Query aneurysm or AV fistula. Patient reports increasing right lower neck pain and swelling. Diagnosed with cervical cancer 3 weeks ago. EXAM: CT ANGIOGRAPHY NECK TECHNIQUE: Multidetector CT imaging of the neck was performed using the standard protocol during bolus administration of intravenous contrast. Multiplanar CT image reconstructions and MIPs were obtained to evaluate the vascular anatomy. Carotid stenosis measurements (when applicable) are obtained utilizing NASCET criteria, using the distal internal carotid diameter as the denominator. CONTRAST:  63m OMNIPAQUE IOHEXOL 350 MG/ML SOLN COMPARISON:  None. FINDINGS: Skeleton: No acute or suspicious osseous lesion identified. Visible bone mineralization appears normal. Upper chest: No superior mediastinal lymphadenopathy; there are subcentimeter prevascular nodes which are within normal limits. Visible lung apices are clear. Other neck: Negative thyroid. The glottis is closed. Laryngeal and pharyngeal soft tissue contours are within normal limits. Negative parapharyngeal and retropharyngeal spaces. Negative visible sublingual space, submandibular glands, and parotid glands. Negative visible masticator spaces. Negative visible posterior fossa. No cervical lymphadenopathy. No neck soft tissue mass. Aortic arch: 3 vessel arch configuration with minimal arch  atherosclerosis. Right carotid system: Tortuous brachiocephalic artery and proximal right CCA without plaque or stenosis. Normal right carotid bifurcation. Negative cervical right ICA and visible proximal right ICA siphon. The right internal jugular vein also appears normal. No evidence of vascular malformation in the right neck. Left carotid system: Left CCA is negative aside from mild tortuosity. Negative left carotid bifurcation. Tortuous left ICA with a partially retropharyngeal course, but otherwise normal to the skull base. Negative visible left ICA siphon. Vertebral arteries: Tortuous proximal right subclavian artery with a kinked appearance at the thoracic inlet (series 6, image 170). Mild calcified plaque at the right subclavian origin without stenosis. The right vertebral artery is highly diminutive but patent, with no plaque or stenosis identified to the skull base. The right vertebral remains diminutive in the posterior fossa and appears to functionally terminates in PICA. Minimal plaque in the proximal left subclavian artery without stenosis. Dominant left vertebral artery with a normal origin and tortuous V1 segment. The left vertebral remains dominant and normal to the basilar. Normal left PICA origin. Negative visible basilar artery. Review of the MIP images confirms the above findings IMPRESSION: 1. Positive for generalized arterial tortuosity, but negative for vascular malformation, aneurysm, soft tissue mass, or metastatic disease in the neck. 2. Minimal atherosclerosis in the neck, no arterial stenosis. Dominant left vertebral artery (normal variant). Electronically Signed   By: HGenevie AnnM.D.   On: 09/13/2019 20:40   [place] TRANSVAGINAL NON-OB (TV ONLY)  Result Date: 08/24/2019 Patient Name: AAllexa AcoffDOB: [birth year]MRN: [id number] REPORT Location: [facility] Date of Service: 08/23/2019 Indications:AUB Findings: The uterus is anteverted and measures 6.1 x 3.4 x  4.9 cm. Echo texture is homogenous without evidence of focal masses. Within the uterus are multiple suspected fibroids measuring: The Endometrium measures 8 mm. Echogenic endometrial microcalcifications seen through -out endometrium. An hypoechoic area seen in the fundal aspect of the endometrium measuring 0.8 x 1.1 x 2.3 cm Right Ovary measures 1.6 x 1.1 x 1.2  cm. It is normal in appearance. Left Ovary measures 2.2 x 1.5 x 1.7 cm. It is normal in appearance. Survey of the adnexa demonstrates no adnexal masses. There is no free fluid in the cul de sac. Impression: 1. Endometrial microcalcifications as described above with a hypoechoic area most likely retain blood in the fundal aspect of the endometrium. 2. Survey of the adnexa demonstrates no adnexal masses. Recommendations: 1.Clinical  correlation with the patient's History and Physical Exam. Jenine M. Albertine Grates    RDMS I have reviewed this study and agree with documented findings. Rubie Maid, MD [facility]  NM PET Image Initial (PI) Skull Base To Thigh  Result Date: 09/06/2019 CLINICAL DATA:  Initial treatment strategy for recently diagnosed endocervical cancer. EXAM: NUCLEAR MEDICINE PET SKULL BASE TO THIGH TECHNIQUE: 9.78 mCi F-18 FDG was injected intravenously. Full-ring PET imaging was performed from the skull base to thigh after the radiotracer. CT data was obtained and used for attenuation correction and anatomic localization. Fasting blood glucose: 98 mg/dl COMPARISON:  None. FINDINGS: Mediastinal blood pool activity: SUV max 2.2 NECK: There is a 7 mm hypermetabolic left supraclavicular node on image 66/3 (SUV max 3.8). No other hypermetabolic cervical lymph nodes are identified.There are no lesions of the pharyngeal mucosal space. Symmetric activity within the lymphoid tissue of Waldeyer's ring, likely physiologic. Incidental CT findings: none CHEST: There are several small hypermetabolic left paratracheal and prevascular space lymph nodes.  6 mm left paratracheal node on image 71/3 has an SUV max of 5.2. Small prevascular node has an SUV max of 5.2. No hypermetabolic axillary lymph nodes. No suspicious pulmonary activity or nodularity. Incidental CT findings: Mild aortic atherosclerosis. Small calcified right infrahilar lymph node. ABDOMEN/PELVIS: There is no hypermetabolic activity within the liver, adrenal glands, spleen or pancreas. There is a hypermetabolic mass involving the cervix and lower uterine segment (SUV max 12.5). No obvious parametrial extension or bladder involvement. There are multiple small hypermetabolic retroperitoneal and pelvic sidewall lymph nodes bilaterally. Representative nodes include an 8 mm right common iliac node on image 188/3 (SUV max 6.9), a right pelvic sidewall node measuring 14 mm on image 217/3 (SUV max 4.6) and a left pelvic sidewall node measuring 12 mm on image 218/3 (SUV max 3.44). The highest of these intra-abdominal lymph nodes are just below the renal hila. Incidental CT findings: Diffuse hepatic steatosis and calcified gallstones are noted. No evidence of urinary tract calculus or hydronephrosis. SKELETON: There is no hypermetabolic activity to suggest osseous metastatic disease. Incidental CT findings: none IMPRESSION: 1. Hypermetabolic mass involving the cervix and lower uterine segment consistent with known cervical cancer. 2. Multiple small hypermetabolic retroperitoneal and pelvic lymph nodes bilaterally consistent with nodal metastases. Small hypermetabolic lymph nodes in the left superior mediastinum and left supraclavicular regions are also suspicious for nodal metastases. 3. Hepatic steatosis and cholelithiasis noted. Electronically Signed   By: Richardean Sale M.D.   On: 09/06/2019 16:07     Assessment and plan- Patient is a 59 y.o. female who presents to [facility] for initial meeting in preparation for starting chemotherapy for the treatment of cervical cancer   1. HPI: Mrs. 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  She was evaluated by Dr. Fransisca Connors and found to have a 3 cm cervical mass.  PET scan on 08/27/19 showed hypermetabolic mass involving the cervix and lower uterine segment.  Several small hypermetabolic retroperitoneal and pelvic lymph nodes consistent with nodal metastases.  Plan is for concurrent chemoradiation beginning next week. she has met with radiation oncology.   2. Chemo Care Clinic/High Risk for ER/Hospitalization during chemotherapy- We discussed the role of the chemo care clinic and identified patient specific risk factors. I discussed that patient was identified as high risk primarily based on: Stage  of disease and comorbidities.  Patient has past medical history positive for: Past Medical History:  Diagnosis Date  . Cancer ([place])   . COVID-19 10/2018  . No pertinent past medical history     Patient has past surgical history positive for: Past Surgical History:  Procedure Laterality Date  . no surgical history      Based on our high risk symptom management report; this patient has a high risk of ED utilization.  The percentage below indicates how "at risk "  this patient based on the factors in this table within one year.   General Risk Score: 0  Values used to calculate this score:   Points  Metrics      0        Age: 58      [facility] Admissions: 0      0        ED Visits: 0      0        Has Chronic Obstructive Pulmonary Disease: No      0        Has Diabetes: No      0        Has Congestive Heart Failure: No      0        Has liver disease: No      0        Has Depression: No      0        Current PCP: Jill Figures, PA-C      0        Has Medicaid: No   3. We discussed that social determinants of health  may have significant impacts on health and outcomes for cancer patients.  Today we discussed specific social determinants of performance status, alcohol use, depression, financial needs, food insecurity, housing, interpersonal violence, social connections, stress, tobacco use, and transportation.    After lengthy discussion the following were identified as areas of need: None at this time.   Outpatient services: We discussed options including home based and outpatient services, DME and care program. We discusssed that patients who participate in regular physical activity report fewer negative impacts of cancer and treatments and report less fatigue.   Financial Concerns: We discussed that living with cancer can create tremendous financial burden.  We discussed options for assistance. I asked that if assistance is needed in affording medications or paying bills to please let us know so that we can provide assistance. We discussed options for food including social services, [employer] ($50 every 2 weeks) and onsite food pantry.  We will also notify [facility] to see if cancer center can provide additional support.  Referral to Social work: Introduced Education officer, museum Elease Etienne and the services he can provide such as support with [employer], cell phone and gas vouchers.   Support groups: We discussed options for support groups at the cancer center. If interested, please notify nurse navigator to enroll. We discussed options for managing stress including healthy eating, exercise as well as participating in no charge counseling services at the cancer center and support groups.  If these are of interest, patient can notify either myself or primary nursing team.We discussed options for management including medications and referral to quit Smart program  Transportation: We discussed options for transportation including acta, paratransit, bus routes, link transit, taxi/uber/lyft, and cancer center  [place].  I have notified primary oncology  team who will help assist with arranging Lucianne Lei transportation for appointments when/if needed. We also discussed options for transportation on short notice/acute visits.  Palliative care services: We have palliative care services available in the cancer center to discuss goals of care and advanced care planning.  Please let us know if you have any questions or would like to speak to our palliative nurse practitioner.  Symptom Management Clinic: We discussed our symptom management clinic which is available for acute concerns while receiving treatment such as nausea, vomiting or diarrhea.  We can be reached via telephone at [phone] or through my chart.  We are available for virtual or in person visits on the same day from 830 to 4 PM Monday through Friday. She denies needing specific assistance at this time and She will be followed by Dr. Collie Siad  clinical team.  Plan: Discussed symptom management clinic. Discussed palliative care services. Discussed resources that are available here at the cancer center. Discussed medications and new prescriptions to begin treatment such as anti-nausea or steroids.   Disposition: RTC on 09/22/19 for simulation for radiation.  She will be scheduled soon to begin weekly cisplatin.  Visit Diagnosis 1. Malignant neoplasm of endocervix [facility])     Patient expressed understanding and was in agreement with this plan. She also understands that She can call clinic at any time with any questions, concerns, or complaints.   Greater than 50% was spent in counseling and coordination of care with this patient including but not limited to discussion of the relevant topics above (See A&P) including, but not limited to diagnosis and management of acute and chronic medical conditions.   [facility] [place]  CC: Dr. Tasia Catchings

## 2019-09-20 NOTE — Telephone Encounter (Signed)
Pt's daughter, [name], notified of COVID test and new treatment appts. Will mail AVS as well.

## 2019-09-22 ENCOUNTER — Ambulatory Visit
Admission: RE | Admit: 2019-09-22 | Discharge: 2019-09-22 | Disposition: A | Discharge status: Home / Self Care | Payer: BC Managed Care – PPO | Source: Ambulatory Visit | Attending: Radiation Oncology | Admitting: Radiation Oncology

## 2019-09-22 DIAGNOSIS — C539 Malignant neoplasm of cervix uteri, unspecified: Secondary | ICD-10-CM | POA: Diagnosis not present

## 2019-09-26 DIAGNOSIS — C539 Malignant neoplasm of cervix uteri, unspecified: Secondary | ICD-10-CM | POA: Insufficient documentation

## 2019-09-26 NOTE — Progress Notes (Signed)
Pharmacist Chemotherapy Monitoring - Initial Assessment    Anticipated start date: 10/03/19    Regimen:  . Are orders appropriate based on the patient's diagnosis, regimen, and cycle? Yes . Does the plan date match the patient's scheduled date? Yes . Is the sequencing of drugs appropriate? Yes . Are the premedications appropriate for the patient's regimen? Yes . Prior Authorization for treatment is: Pending o If applicable, is the correct biosimilar selected based on the patient's insurance? not applicable  Organ Function and Labs: [name] Kitchen Are dose adjustments needed based on the patient's renal function, hepatic function, or hematologic function? No . Are appropriate labs ordered prior to the start of patient's treatment? Yes . Other organ system assessment, if indicated: cisplatin: baseline audiogram . The following baseline labs, if indicated, have been ordered: N/A  Dose Assessment: . Are the drug doses appropriate? Yes . Are the following correct: o Drug concentrations Yes o IV fluid compatible with drug Yes o Administration routes Yes o Timing of therapy Yes . If applicable, does the patient have documented access for treatment and/or plans for port-a-cath placement? not applicable . If applicable, have lifetime cumulative doses been properly documented and assessed? not applicable Lifetime Dose Tracking  No doses have been documented on this patient for the following tracked chemicals: Doxorubicin, Epirubicin, Idarubicin, Daunorubicin, Mitoxantrone, Bleomycin, Oxaliplatin, Carboplatin, Liposomal Doxorubicin  o   Toxicity Monitoring/Prevention: . The patient has the following take home antiemetics prescribed: Prochlorperazine . The patient has the following take home medications prescribed: N/A . Medication allergies and previous infusion related reactions, if applicable, have been reviewed and addressed. Yes . The patient's current medication list has been assessed for drug-drug  interactions with their chemotherapy regimen. no significant drug-drug interactions were identified on review.  Order Review: . Are the treatment plan orders signed? Yes . Is the patient scheduled to see a provider prior to their treatment? Yes  I verify that I have reviewed each item in the above checklist and answered each question accordingly.  Donnae Michels K 09/26/2019 8:43 AM

## 2019-09-27 ENCOUNTER — Ambulatory Visit: Payer: BC Managed Care – PPO | Attending: Internal Medicine

## 2019-09-27 DIAGNOSIS — Z20822 Contact with and (suspected) exposure to covid-19: Secondary | ICD-10-CM

## 2019-09-27 DIAGNOSIS — C539 Malignant neoplasm of cervix uteri, unspecified: Secondary | ICD-10-CM | POA: Diagnosis not present

## 2019-09-28 LAB — NOVEL CORONAVIRUS, NAA: SARS-CoV-2, NAA: NOT DETECTED

## 2019-09-28 LAB — SARS-COV-2, NAA 2 DAY TAT

## 2019-10-02 ENCOUNTER — Ambulatory Visit: Admission: RE | Admit: 2019-10-02 | Payer: BC Managed Care – PPO | Source: Ambulatory Visit

## 2019-10-03 ENCOUNTER — Ambulatory Visit
Admission: RE | Admit: 2019-10-03 | Discharge: 2019-10-03 | Disposition: A | Discharge status: Home / Self Care | Payer: BC Managed Care – PPO | Source: Ambulatory Visit | Attending: Radiation Oncology | Admitting: Radiation Oncology

## 2019-10-03 ENCOUNTER — Inpatient Hospital Stay: Payer: BC Managed Care – PPO | Attending: Oncology

## 2019-10-03 ENCOUNTER — Encounter: Payer: Self-pay | Admitting: Oncology

## 2019-10-03 ENCOUNTER — Inpatient Hospital Stay: Payer: BC Managed Care – PPO

## 2019-10-03 ENCOUNTER — Inpatient Hospital Stay (HOSPITAL_BASED_OUTPATIENT_CLINIC_OR_DEPARTMENT_OTHER): Payer: BC Managed Care – PPO | Admitting: Oncology

## 2019-10-03 ENCOUNTER — Other Ambulatory Visit: Payer: Self-pay

## 2019-10-03 VITALS — BP 138/67 | HR 75 | Temp 98.2°F | Resp 18 | Wt 187.9 lb

## 2019-10-03 DIAGNOSIS — C530 Malignant neoplasm of endocervix: Secondary | ICD-10-CM

## 2019-10-03 DIAGNOSIS — Z5111 Encounter for antineoplastic chemotherapy: Secondary | ICD-10-CM | POA: Insufficient documentation

## 2019-10-03 DIAGNOSIS — C539 Malignant neoplasm of cervix uteri, unspecified: Secondary | ICD-10-CM | POA: Diagnosis not present

## 2019-10-03 DIAGNOSIS — C538 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 uteri: Secondary | ICD-10-CM | POA: Diagnosis not present

## 2019-10-03 DIAGNOSIS — R110 Nausea: Secondary | ICD-10-CM | POA: Insufficient documentation

## 2019-10-03 DIAGNOSIS — Z7189 Other specified counseling: Secondary | ICD-10-CM | POA: Diagnosis not present

## 2019-10-03 LAB — CBC WITH DIFFERENTIAL/PLATELET
Abs Immature Granulocytes: 0.02 10*3/uL (ref 0.00–0.07)
Basophils Absolute: 0.1 10*3/uL (ref 0.0–0.1)
Basophils Relative: 1 %
Eosinophils Absolute: 0.1 10*3/uL (ref 0.0–0.5)
Eosinophils Relative: 2 %
HCT: 40.4 % (ref 36.0–46.0)
Hemoglobin: 13.3 g/dL (ref 12.0–15.0)
Immature Granulocytes: 0 %
Lymphocytes Relative: 32 %
Lymphs Abs: 1.9 10*3/uL (ref 0.7–4.0)
MCH: 29.6 pg (ref 26.0–34.0)
MCHC: 32.9 g/dL (ref 30.0–36.0)
MCV: 89.8 fL (ref 80.0–100.0)
Monocytes Absolute: 0.5 10*3/uL (ref 0.1–1.0)
Monocytes Relative: 9 %
Neutro Abs: 3.3 10*3/uL (ref 1.7–7.7)
Neutrophils Relative %: 56 %
Platelets: 216 10*3/uL (ref 150–400)
RBC: 4.5 MIL/uL (ref 3.87–5.11)
RDW: 12.7 % (ref 11.5–15.5)
WBC: 5.8 10*3/uL (ref 4.0–10.5)
nRBC: 0 % (ref 0.0–0.2)

## 2019-10-03 LAB — BASIC METABOLIC PANEL
Anion gap: 8 (ref 5–15)
BUN: 15 mg/dL (ref 6–20)
CO2: 26 mmol/L (ref 22–32)
Calcium: 9 mg/dL (ref 8.9–10.3)
Chloride: 106 mmol/L (ref 98–111)
Creatinine, Ser: 0.62 mg/dL (ref 0.44–1.00)
GFR calc Af Amer: >60 mL/min (ref >60)
GFR calc non Af Amer: >60 mL/min (ref >60)
Glucose, Bld: 104 mg/dL — ABNORMAL HIGH (ref 70–99)
Potassium: 4.1 mmol/L (ref 3.5–5.1)
Sodium: 140 mmol/L (ref 135–145)

## 2019-10-03 MED ORDER — ONDANSETRON HCL 8 MG PO TABS
8.0000 mg | ORAL_TABLET | Freq: Three times a day (TID) | ORAL | 1 refills | Status: DC | PRN
Start: 2019-10-03 — End: 2020-01-30

## 2019-10-03 MED ORDER — PALONOSETRON HCL INJECTION 0.25 MG/5ML
0.2500 mg | Freq: Once | INTRAVENOUS | Status: AC
  Administered 2019-10-03: 0.25 mg via INTRAVENOUS
  Filled 2019-10-03: qty 5

## 2019-10-03 MED ORDER — POTASSIUM CHLORIDE 2 MEQ/ML IV SOLN
Freq: Once | INTRAVENOUS | Status: AC
  Filled 2019-10-03: qty 1000

## 2019-10-03 MED ORDER — SODIUM CHLORIDE 0.9 % IV SOLN
Freq: Once | INTRAVENOUS | Status: AC
  Filled 2019-10-03: qty 250

## 2019-10-03 MED ORDER — SODIUM CHLORIDE 0.9 % IV SOLN
40.0000 mg/m2 | Freq: Once | INTRAVENOUS | Status: AC
  Administered 2019-10-03: 77 mg via INTRAVENOUS
  Filled 2019-10-03: qty 77

## 2019-10-03 MED ORDER — SODIUM CHLORIDE 0.9 % IV SOLN
10.0000 mg | Freq: Once | INTRAVENOUS | Status: AC
  Administered 2019-10-03: 10 mg via INTRAVENOUS
  Filled 2019-10-03: qty 10

## 2019-10-03 MED ORDER — SODIUM CHLORIDE 0.9 % IV SOLN
150.0000 mg | Freq: Once | INTRAVENOUS | Status: AC
  Administered 2019-10-03: 150 mg via INTRAVENOUS
  Filled 2019-10-03: qty 150

## 2019-10-03 NOTE — Progress Notes (Signed)
Pt here for New treatment, Cisplatin. Pt reports feeling a little nervous.

## 2019-10-03 NOTE — Progress Notes (Signed)
Hematology/Oncology follow up note [facility] Telephone:([phone] Fax:([phone]) [phone]   Patient Care Team: Crissie Figures, PA-C as PCP - General (Physician Assistant) Clent Jacks, RN as Oncology Nurse Navigator  REFERRING PROVIDER: Crissie Figures, PA-C  CHIEF COMPLAINTS/REASON FOR VISIT:  Follow up for cervical cancer.   HISTORY OF PRESENTING ILLNESS:   Jill Rodriguez is a  59 y.o.  female with PMH listed below was seen in consultation at the request of  Crissie Figures, PA-C  for evaluation of cervical cancer.   Patient is G3, P3 Spanish-speaking postmenopausal female who initially presented with vaginal bleeding.  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  No intervention seen.  08/23/2019, pelvis ultrasound showed endometrium measures 8 mm.  Echogenic endometrial microcalcification seen throughout endometrium.  Hypoechoic area in the fundal aspect of the endometrium measures 0.8 x 1.1 x 2.3 cm Survey of the adnexa showed no adnexal masses.  Patient has history of prominent right neck pelvis and CT angio neck has been ordered. Patient was seen by Dr. Fransisca Connors, clinically the tumor is 3 cm and replaced whole cervix. 09/06/2019 PET scan showed hypermetabolic mass involving the cervix and the lower uterine segment consistent with known cervical cancer. Multiple small hypermetabolic retroperitoneal and pelvic lymph node bilaterally consistent with nodal metastasis. She also has small hypermetabolic lymph nodes in the left superior mediastinum and left supraclavicular regions are also suspicious for nodal metastasis.  Hepatic steatosis no cholelithiasis noted.  Today patient was accompanied by daughter to discuss management plan. Patient reports lower  abdominal pain.  Vaginal bleeding has stopped since the biopsy.  INTERVAL HISTORY Jill Rodriguez is a 59 y.o. female who has above history reviewed by me today presents for follow up visit for management of at least stage IIIc cervical cancer. Problems and complaints are listed below: Patient is accompanied by her daughter. She has started radiation yesterday. Baseline hearing testing was done.   Review of Systems  Constitutional: Negative for appetite change, chills, fatigue and fever.  HENT:   Negative for hearing loss and voice change.   Eyes: Negative for eye problems.  Respiratory: Negative for chest tightness and cough.   Cardiovascular: Negative for chest pain.  Gastrointestinal: Negative for abdominal distention, abdominal pain and blood in stool.  Endocrine: Negative for hot flashes.  Genitourinary: Positive for vaginal bleeding. Negative for difficulty urinating and frequency.   Musculoskeletal: Negative for arthralgias.  Skin: Negative for itching and rash.  Neurological: Negative for extremity weakness.  Hematological: Negative for adenopathy.  Psychiatric/Behavioral: Negative for confusion.    MEDICAL HISTORY:  Past Medical History:  Diagnosis Date  . Cancer ([place])   . COVID-19 10/2018  . No pertinent past medical history     SURGICAL HISTORY: Past Surgical History:  Procedure Laterality Date  . no surgical history      SOCIAL HISTORY: Social History   Socioeconomic History  . Marital status: Married    Spouse name: Not on file  . Number of children: Not on file  . Years of education: Not on file  . Highest education level: Not on file  Occupational History  . Not on file  Tobacco Use  . Smoking status: Never Smoker  . Smokeless tobacco: Never Used  Substance and Sexual Activity  . Alcohol use: Never  . Drug use:  Never  . Sexual activity: Yes    Birth control/protection: None  Other Topics Concern  . Not on file  Social History Narrative   . Not on file   Social Determinants of Health   Financial Resource Strain:   . Difficulty of Paying Living Expenses:   Food Insecurity:   . Worried About Charity fundraiser in the Last Year:   . Arboriculturist in the Last Year:   Transportation Needs:   . Film/video editor (Medical):   [name] Kitchen Lack of Transportation (Non-Medical):   Physical Activity:   . Days of Exercise per Week:   . Minutes of Exercise per Session:   Stress:   . Feeling of Stress :   Social Connections:   . Frequency of Communication with Friends and Family:   . Frequency of Social Gatherings with Friends and Family:   . Attends Religious Services:   . Active Member of Clubs or Organizations:   . Attends Archivist Meetings:   [name] Kitchen Marital Status:   Intimate Partner Violence:   . Fear of Current or Ex-Partner:   . Emotionally Abused:   [name] Kitchen Physically Abused:   . Sexually Abused:     FAMILY HISTORY: Family History  Problem Relation Age of Onset  . Blindness Mother   . Glaucoma Mother   . Diabetes Father   . Diabetes Brother     ALLERGIES:  has No Known Allergies.  MEDICATIONS:  Current Outpatient Medications  Medication Sig Dispense Refill  . prochlorperazine (COMPAZINE) 10 MG tablet Take 1 tablet (10 mg total) by mouth every 6 (six) hours as needed (Nausea or vomiting). 30 tablet 1   No current facility-administered medications for this visit.     PHYSICAL EXAMINATION: ECOG PERFORMANCE STATUS: 1 - Symptomatic but completely ambulatory Vitals:   10/03/19 0847  BP: 138/67  Pulse: 75  Resp: 18  Temp: 98.2 F (36.8 C)   Filed Weights   10/03/19 0847  Weight: 187 lb 14.4 oz (85.2 kg)    Physical Exam Constitutional:      General: She is not in acute distress. HENT:     Head: Normocephalic and atraumatic.  Eyes:     General: No scleral icterus. Cardiovascular:     Rate and Rhythm: Normal rate and regular rhythm.     Heart sounds: Normal heart sounds.  Pulmonary:      Effort: Pulmonary effort is normal. No respiratory distress.     Breath sounds: No wheezing.  Abdominal:     General: Bowel sounds are normal. There is no distension.     Palpations: Abdomen is soft.  Musculoskeletal:        General: No deformity. Normal range of motion.     Cervical back: Normal range of motion and neck supple.  Skin:    General: Skin is warm and dry.     Findings: No erythema or rash.  Neurological:     Mental Status: She is alert and oriented to person, place, and time. Mental status is at baseline.     Cranial Nerves: No cranial nerve deficit.     Coordination: Coordination normal.  Psychiatric:        Mood and Affect: Mood normal.     LABORATORY DATA:  I have reviewed the data as listed Lab Results  Component Value Date   WBC 5.8 10/03/2019   HGB 13.3 10/03/2019   HCT 40.4 10/03/2019   MCV 89.8 10/03/2019   PLT 216  10/03/2019   Recent Labs    08/23/19 0915  NA 141  K 4.0  CL 104  CO2 24  GLUCOSE 100*  BUN 14  CREATININE 0.64  CALCIUM 9.2  GFRNONAA 99  GFRAA 114  PROT 7.6  ALBUMIN 4.5  AST 30  ALT 31  ALKPHOS 100  BILITOT 0.4   Iron/TIBC/Ferritin/ %Sat No results found for: IRON, TIBC, FERRITIN, IRONPCTSAT    RADIOGRAPHIC STUDIES: I have personally reviewed the radiological images as listed and agreed with the findings in the report. CT ANGIO NECK W OR WO CONTRAST  Result Date: 09/13/2019 CLINICAL DATA:  59 year old female with pulsatile right lateral neck mass with bruit. Query aneurysm or AV fistula. Patient reports increasing right lower neck pain and swelling. Diagnosed with cervical cancer 3 weeks ago. EXAM: CT ANGIOGRAPHY NECK TECHNIQUE: Multidetector CT imaging of the neck was performed using the standard protocol during bolus administration of intravenous contrast. Multiplanar CT image reconstructions and MIPs were obtained to evaluate the vascular anatomy. Carotid stenosis measurements (when applicable) are obtained utilizing  NASCET criteria, using the distal internal carotid diameter as the denominator. CONTRAST:  72mL OMNIPAQUE IOHEXOL 350 MG/ML SOLN COMPARISON:  None. FINDINGS: Skeleton: No acute or suspicious osseous lesion identified. Visible bone mineralization appears normal. Upper chest: No superior mediastinal lymphadenopathy; there are subcentimeter prevascular nodes which are within normal limits. Visible lung apices are clear. Other neck: Negative thyroid. The glottis is closed. Laryngeal and pharyngeal soft tissue contours are within normal limits. Negative parapharyngeal and retropharyngeal spaces. Negative visible sublingual space, submandibular glands, and parotid glands. Negative visible masticator spaces. Negative visible posterior fossa. No cervical lymphadenopathy. No neck soft tissue mass. Aortic arch: 3 vessel arch configuration with minimal arch atherosclerosis. Right carotid system: Tortuous brachiocephalic artery and proximal right CCA without plaque or stenosis. Normal right carotid bifurcation. Negative cervical right ICA and visible proximal right ICA siphon. The right internal jugular vein also appears normal. No evidence of vascular malformation in the right neck. Left carotid system: Left CCA is negative aside from mild tortuosity. Negative left carotid bifurcation. Tortuous left ICA with a partially retropharyngeal course, but otherwise normal to the skull base. Negative visible left ICA siphon. Vertebral arteries: Tortuous proximal right subclavian artery with a kinked appearance at the thoracic inlet (series 6, image 170). Mild calcified plaque at the right subclavian origin without stenosis. The right vertebral artery is highly diminutive but patent, with no plaque or stenosis identified to the skull base. The right vertebral remains diminutive in the posterior fossa and appears to functionally terminates in PICA. Minimal plaque in the proximal left subclavian artery without stenosis. Dominant left  vertebral artery with a normal origin and tortuous V1 segment. The left vertebral remains dominant and normal to the basilar. Normal left PICA origin. Negative visible basilar artery. Review of the MIP images confirms the above findings IMPRESSION: 1. Positive for generalized arterial tortuosity, but negative for vascular malformation, aneurysm, soft tissue mass, or metastatic disease in the neck. 2. Minimal atherosclerosis in the neck, no arterial stenosis. Dominant left vertebral artery (normal variant). Electronically Signed   By: Genevie Ann M.D.   On: 09/13/2019 20:40   NM PET Image Initial (PI) Skull Base To Thigh  Result Date: 09/06/2019 CLINICAL DATA:  Initial treatment strategy for recently diagnosed endocervical cancer. EXAM: NUCLEAR MEDICINE PET SKULL BASE TO THIGH TECHNIQUE: 9.78 mCi F-18 FDG was injected intravenously. Full-ring PET imaging was performed from the skull base to thigh after the radiotracer. CT data  was obtained and used for attenuation correction and anatomic localization. Fasting blood glucose: 98 mg/dl COMPARISON:  None. FINDINGS: Mediastinal blood pool activity: SUV max 2.2 NECK: There is a 7 mm hypermetabolic left supraclavicular node on image 66/3 (SUV max 3.8). No other hypermetabolic cervical lymph nodes are identified.There are no lesions of the pharyngeal mucosal space. Symmetric activity within the lymphoid tissue of Waldeyer's ring, likely physiologic. Incidental CT findings: none CHEST: There are several small hypermetabolic left paratracheal and prevascular space lymph nodes. 6 mm left paratracheal node on image 71/3 has an SUV max of 5.2. Small prevascular node has an SUV max of 5.2. No hypermetabolic axillary lymph nodes. No suspicious pulmonary activity or nodularity. Incidental CT findings: Mild aortic atherosclerosis. Small calcified right infrahilar lymph node. ABDOMEN/PELVIS: There is no hypermetabolic activity within the liver, adrenal glands, spleen or pancreas.  There is a hypermetabolic mass involving the cervix and lower uterine segment (SUV max 12.5). No obvious parametrial extension or bladder involvement. There are multiple small hypermetabolic retroperitoneal and pelvic sidewall lymph nodes bilaterally. Representative nodes include an 8 mm right common iliac node on image 188/3 (SUV max 6.9), a right pelvic sidewall node measuring 14 mm on image 217/3 (SUV max 4.6) and a left pelvic sidewall node measuring 12 mm on image 218/3 (SUV max 3.44). The highest of these intra-abdominal lymph nodes are just below the renal hila. Incidental CT findings: Diffuse hepatic steatosis and calcified gallstones are noted. No evidence of urinary tract calculus or hydronephrosis. SKELETON: There is no hypermetabolic activity to suggest osseous metastatic disease. Incidental CT findings: none IMPRESSION: 1. Hypermetabolic mass involving the cervix and lower uterine segment consistent with known cervical cancer. 2. Multiple small hypermetabolic retroperitoneal and pelvic lymph nodes bilaterally consistent with nodal metastases. Small hypermetabolic lymph nodes in the left superior mediastinum and left supraclavicular regions are also suspicious for nodal metastases. 3. Hepatic steatosis and cholelithiasis noted. Electronically Signed   By: Richardean Sale M.D.   On: 09/06/2019 16:07      ASSESSMENT & PLAN:  1. Goals of care, counseling/discussion   2. 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 ([place])    # At least Stage IIIc Cervix cancer.  Starting concurrent radiation and weekly cisplatin. Goal of care is curative.  Labs are reviewed and discussed with patient. Proceed with cycle 1 cisplatin today.  Patient has compazine Q6h PRN at home and I discussed with her about the instructions.  She also has Rx of Zofran to pick up and try if compazine does not work.   #  Small hypermetabolic paratracheal and supraclavicular nodes, clinical significance unknown. Attention  on follow up   Patient signed interpreter waiver form and her daughter interpret for her mother.  Supportive care measures are necessary for patient well-being and will be provided as necessary. We spent sufficient time to discuss many aspect of care, questions were answered to patient's satisfaction.  Orders Placed This Encounter  Procedures  . Comprehensive metabolic panel    Standing Status:   Standing    Number of Occurrences:   20    Standing Expiration Date:   10/02/2020    All questions were answered. The patient knows to call the clinic with any problems questions or concerns.  Return of visit:  1 week   Earlie Server, MD, PhD Hematology Oncology [facility] Pager- [phone] 10/03/2019

## 2019-10-04 ENCOUNTER — Ambulatory Visit
Admission: RE | Admit: 2019-10-04 | Discharge: 2019-10-04 | Disposition: A | Discharge status: Home / Self Care | Payer: BC Managed Care – PPO | Source: Ambulatory Visit | Attending: Radiation Oncology | Admitting: Radiation Oncology

## 2019-10-04 DIAGNOSIS — C539 Malignant neoplasm of cervix uteri, unspecified: Secondary | ICD-10-CM | POA: Diagnosis not present

## 2019-10-05 ENCOUNTER — Ambulatory Visit
Admission: RE | Admit: 2019-10-05 | Discharge: 2019-10-05 | Disposition: A | Discharge status: Home / Self Care | Payer: BC Managed Care – PPO | Source: Ambulatory Visit | Attending: Radiation Oncology | Admitting: Radiation Oncology

## 2019-10-05 ENCOUNTER — Telehealth: Payer: Self-pay

## 2019-10-05 DIAGNOSIS — C539 Malignant neoplasm of cervix uteri, unspecified: Secondary | ICD-10-CM | POA: Diagnosis not present

## 2019-10-05 NOTE — Telephone Encounter (Signed)
T/C to pt for follow up after receiving first chemo,  using our in house interpreter Ronnald Collum, pt did not answer and voice mail said "unable to leave message due to mail box is full".   Will try again later.

## 2019-10-06 ENCOUNTER — Telehealth: Payer: Self-pay

## 2019-10-06 ENCOUNTER — Ambulatory Visit
Admission: RE | Admit: 2019-10-06 | Discharge: 2019-10-06 | Disposition: A | Discharge status: Home / Self Care | Payer: BC Managed Care – PPO | Source: Ambulatory Visit | Attending: Radiation Oncology | Admitting: Radiation Oncology

## 2019-10-06 DIAGNOSIS — C539 Malignant neoplasm of cervix uteri, unspecified: Secondary | ICD-10-CM | POA: Diagnosis not present

## 2019-10-06 NOTE — Telephone Encounter (Signed)
Received call from [facility] requesting information for patient in regards the medical leave.  They were asking for information such as dx codes, clinical findings date range for leave.    Returned call to [employer] advising that we faxed a completed FMLA medical certification form with details on 10/04/19.  Asked her to return my call if they have not received completed form (faxing form again today).  We did also receive a Disability Medical FMLA for this patient.  Benjamine Mola has been trying to contact patient to discuss if her plan is for a continuous or intermittent leave during treatment.  Patient has not returned call for clarification.

## 2019-10-08 ENCOUNTER — Other Ambulatory Visit: Payer: Self-pay | Admitting: Oncology

## 2019-10-08 MED ORDER — OLANZAPINE 10 MG PO TABS
10.0000 mg | ORAL_TABLET | Freq: Every day | ORAL | 0 refills | Status: DC
Start: 2019-10-08 — End: 2019-10-10

## 2019-10-09 ENCOUNTER — Ambulatory Visit
Admission: RE | Admit: 2019-10-09 | Discharge: 2019-10-09 | Disposition: A | Discharge status: Home / Self Care | Payer: BC Managed Care – PPO | Source: Ambulatory Visit | Attending: Radiation Oncology | Admitting: Radiation Oncology

## 2019-10-09 DIAGNOSIS — C539 Malignant neoplasm of cervix uteri, unspecified: Secondary | ICD-10-CM | POA: Diagnosis not present

## 2019-10-10 ENCOUNTER — Inpatient Hospital Stay: Payer: BC Managed Care – PPO

## 2019-10-10 ENCOUNTER — Other Ambulatory Visit: Payer: Self-pay

## 2019-10-10 ENCOUNTER — Inpatient Hospital Stay (HOSPITAL_BASED_OUTPATIENT_CLINIC_OR_DEPARTMENT_OTHER): Payer: BC Managed Care – PPO | Admitting: Oncology

## 2019-10-10 ENCOUNTER — Ambulatory Visit
Admission: RE | Admit: 2019-10-10 | Discharge: 2019-10-10 | Disposition: A | Discharge status: Home / Self Care | Payer: BC Managed Care – PPO | Source: Ambulatory Visit | Attending: Radiation Oncology | Admitting: Radiation Oncology

## 2019-10-10 ENCOUNTER — Encounter: Payer: Self-pay | Admitting: Oncology

## 2019-10-10 VITALS — BP 120/73 | HR 82 | Temp 97.3°F | Resp 18 | Wt 185.2 lb

## 2019-10-10 VITALS — BP 148/82 | HR 86 | Resp 18

## 2019-10-10 DIAGNOSIS — Z5111 Encounter for antineoplastic chemotherapy: Secondary | ICD-10-CM | POA: Insufficient documentation

## 2019-10-10 DIAGNOSIS — R1110 Vomiting, unspecified: Secondary | ICD-10-CM | POA: Insufficient documentation

## 2019-10-10 DIAGNOSIS — C530 Malignant neoplasm of endocervix: Secondary | ICD-10-CM

## 2019-10-10 DIAGNOSIS — Z7189 Other specified counseling: Secondary | ICD-10-CM

## 2019-10-10 DIAGNOSIS — B370 Candidal stomatitis: Secondary | ICD-10-CM | POA: Diagnosis not present

## 2019-10-10 DIAGNOSIS — R112 Nausea with vomiting, unspecified: Secondary | ICD-10-CM | POA: Diagnosis not present

## 2019-10-10 DIAGNOSIS — C538 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 uteri: Secondary | ICD-10-CM | POA: Diagnosis not present

## 2019-10-10 DIAGNOSIS — C539 Malignant neoplasm of cervix uteri, unspecified: Secondary | ICD-10-CM | POA: Diagnosis not present

## 2019-10-10 LAB — COMPREHENSIVE METABOLIC PANEL
ALT: 38 U/L (ref 0–44)
AST: 28 U/L (ref 15–41)
Albumin: 3.9 g/dL (ref 3.5–5.0)
Alkaline Phosphatase: 68 U/L (ref 38–126)
Anion gap: 8 (ref 5–15)
BUN: 16 mg/dL (ref 6–20)
CO2: 30 mmol/L (ref 22–32)
Calcium: 9 mg/dL (ref 8.9–10.3)
Chloride: 102 mmol/L (ref 98–111)
Creatinine, Ser: 0.64 mg/dL (ref 0.44–1.00)
GFR calc Af Amer: >60 mL/min (ref >60)
GFR calc non Af Amer: >60 mL/min (ref >60)
Glucose, Bld: 129 mg/dL — ABNORMAL HIGH (ref 70–99)
Potassium: 3.6 mmol/L (ref 3.5–5.1)
Sodium: 140 mmol/L (ref 135–145)
Total Bilirubin: 0.7 mg/dL (ref 0.3–1.2)
Total Protein: 7.5 g/dL (ref 6.5–8.1)

## 2019-10-10 LAB — CBC WITH DIFFERENTIAL/PLATELET
Abs Immature Granulocytes: 0.02 10*3/uL (ref 0.00–0.07)
Basophils Absolute: 0 10*3/uL (ref 0.0–0.1)
Basophils Relative: 1 %
Eosinophils Absolute: 0.1 10*3/uL (ref 0.0–0.5)
Eosinophils Relative: 3 %
HCT: 39.6 % (ref 36.0–46.0)
Hemoglobin: 13.1 g/dL (ref 12.0–15.0)
Immature Granulocytes: 1 %
Lymphocytes Relative: 21 %
Lymphs Abs: 0.8 10*3/uL (ref 0.7–4.0)
MCH: 29.8 pg (ref 26.0–34.0)
MCHC: 33.1 g/dL (ref 30.0–36.0)
MCV: 90 fL (ref 80.0–100.0)
Monocytes Absolute: 0.4 10*3/uL (ref 0.1–1.0)
Monocytes Relative: 10 %
Neutro Abs: 2.5 10*3/uL (ref 1.7–7.7)
Neutrophils Relative %: 64 %
Platelets: 188 10*3/uL (ref 150–400)
RBC: 4.4 MIL/uL (ref 3.87–5.11)
RDW: 12.1 % (ref 11.5–15.5)
WBC: 3.9 10*3/uL — ABNORMAL LOW (ref 4.0–10.5)
nRBC: 0 % (ref 0.0–0.2)

## 2019-10-10 MED ORDER — SODIUM CHLORIDE 0.9 % IV SOLN
10.0000 mg | Freq: Once | INTRAVENOUS | Status: AC
  Administered 2019-10-10: 10 mg via INTRAVENOUS
  Filled 2019-10-10: qty 10

## 2019-10-10 MED ORDER — POTASSIUM CHLORIDE 2 MEQ/ML IV SOLN
Freq: Once | INTRAVENOUS | Status: AC
  Filled 2019-10-10: qty 1000

## 2019-10-10 MED ORDER — OLANZAPINE 10 MG PO TABS: 10.0000 mg | ORAL_TABLET | Freq: Every day | ORAL | 0 refills | Status: DC

## 2019-10-10 MED ORDER — DEXAMETHASONE 4 MG PO TABS
8.0000 mg | ORAL_TABLET | ORAL | 0 refills | Status: DC
Start: 2019-10-10 — End: 2021-07-25

## 2019-10-10 MED ORDER — SODIUM CHLORIDE 0.9 % IV SOLN
150.0000 mg | Freq: Once | INTRAVENOUS | Status: AC
  Administered 2019-10-10: 150 mg via INTRAVENOUS
  Filled 2019-10-10: qty 150

## 2019-10-10 MED ORDER — NYSTATIN 100000 UNIT/ML MT SUSP
5.0000 mL | Freq: Four times a day (QID) | OROMUCOSAL | 0 refills | Status: DC
Start: 2019-10-10 — End: 2019-10-16

## 2019-10-10 MED ORDER — SODIUM CHLORIDE 0.9 % IV SOLN
Freq: Once | INTRAVENOUS | Status: AC
  Filled 2019-10-10: qty 250

## 2019-10-10 MED ORDER — SODIUM CHLORIDE 0.9 % IV SOLN
40.0000 mg/m2 | Freq: Once | INTRAVENOUS | Status: AC
  Administered 2019-10-10: 77 mg via INTRAVENOUS
  Filled 2019-10-10: qty 77

## 2019-10-10 MED ORDER — PALONOSETRON HCL INJECTION 0.25 MG/5ML
0.2500 mg | Freq: Once | INTRAVENOUS | Status: AC
  Administered 2019-10-10: 0.25 mg via INTRAVENOUS
  Filled 2019-10-10: qty 5

## 2019-10-10 NOTE — Progress Notes (Signed)
Patient tolerated infusion well. Denies any s/s at this time. VSS. Discharged home.

## 2019-10-10 NOTE — Progress Notes (Signed)
Patient here for follow up. Pt reports feeling weak and fatigued. She states she does not get hungry or thirsty and has been nauseated.

## 2019-10-10 NOTE — Progress Notes (Signed)
Hematology/Oncology follow up note [facility] Telephone:(336) [phone] Fax:(336) [phone]   Patient Care Team: Crissie Figures, PA-C as PCP - General (Physician Assistant) Clent Jacks, RN as Oncology Nurse Navigator  REFERRING PROVIDER: Crissie Figures, PA-C  CHIEF COMPLAINTS/REASON FOR VISIT:  Follow up for cervical cancer.   HISTORY OF PRESENTING ILLNESS:   Jill Rodriguez is a  59 y.o.  female with PMH listed below was seen in consultation at the request of  Crissie Figures, PA-C  for evaluation of cervical cancer.   Patient is G3, P3 Spanish-speaking postmenopausal female who initially presented with vaginal bleeding.  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  No intervention seen.  08/23/2019, pelvis ultrasound showed endometrium measures 8 mm.  Echogenic endometrial microcalcification seen throughout endometrium.  Hypoechoic area in the fundal aspect of the endometrium measures 0.8 x 1.1 x 2.3 cm Survey of the adnexa showed no adnexal masses.  Patient has history of prominent right neck pelvis and CT angio neck has been ordered. Patient was seen by Dr. Fransisca Connors, clinically the tumor is 3 cm and replaced whole cervix. 09/06/2019 PET scan showed hypermetabolic mass involving the cervix and the lower uterine segment consistent with known cervical cancer. Multiple small hypermetabolic retroperitoneal and pelvic lymph node bilaterally consistent with nodal metastasis. She also has small hypermetabolic lymph nodes in the left superior mediastinum and left supraclavicular regions are also suspicious for nodal metastasis.  Hepatic steatosis no cholelithiasis noted.  Today patient was accompanied by daughter to discuss management plan. Patient reports lower  abdominal pain.  Vaginal bleeding has stopped since the biopsy.  INTERVAL HISTORY Jill Rodriguez is a 59 y.o. female who has above history reviewed by me today presents for follow up visit for management of at least stage IIIc cervical cancer. Problems and complaints are listed below: Patient is accompanied by her daughter. Patient is currently on concurrent chemotherapy with cisplatin weekly and radiation. Status post 1 dose of cisplatin last week.   Patient reports feeling nauseated.  No vomiting.  She has tried both Compazine and Zofran which did not help her symptoms.  She called on-call physician and discussed with her over the weekend and was prescribed olanzapine which she has tried and has had some improvement of her symptoms. Reports feeling tired and fatigued.  No appetite.  Review of Systems  Constitutional: Positive for fatigue. Negative for appetite change, chills and fever.  HENT:   Negative for hearing loss and voice change.   Eyes: Negative for eye problems.  Respiratory: Negative for chest tightness and cough.   Cardiovascular: Negative for chest pain.  Gastrointestinal: Positive for nausea. Negative for abdominal distention, abdominal pain and blood in stool.  Endocrine: Negative for hot flashes.  Genitourinary: Negative for difficulty urinating and frequency.   Musculoskeletal: Negative for arthralgias.  Skin: Negative for itching and rash.  Neurological: Negative for extremity weakness.  Hematological: Negative for adenopathy.  Psychiatric/Behavioral: Negative for confusion.    MEDICAL HISTORY:  Past Medical History:  Diagnosis Date  . Cancer ([place])   . COVID-19 10/2018  . No pertinent past medical history     SURGICAL HISTORY: Past Surgical History:  Procedure Laterality Date  . no surgical history      SOCIAL HISTORY: Social History   Socioeconomic History  . Marital status: Married  Spouse name: Not on file  . Number of children: Not on file   . Years of education: Not on file  . Highest education level: Not on file  Occupational History  . Not on file  Tobacco Use  . Smoking status: Never Smoker  . Smokeless tobacco: Never Used  Vaping Use  . Vaping Use: Never used  Substance and Sexual Activity  . Alcohol use: Never  . Drug use: Never  . Sexual activity: Yes    Birth control/protection: None  Other Topics Concern  . Not on file  Social History Narrative  . Not on file   Social Determinants of Health   Financial Resource Strain:   . Difficulty of Paying Living Expenses:   Food Insecurity:   . Worried About Charity fundraiser in the Last Year:   . Arboriculturist in the Last Year:   Transportation Needs:   . Film/video editor (Medical):   [name] Kitchen Lack of Transportation (Non-Medical):   Physical Activity:   . Days of Exercise per Week:   . Minutes of Exercise per Session:   Stress:   . Feeling of Stress :   Social Connections:   . Frequency of Communication with Friends and Family:   . Frequency of Social Gatherings with Friends and Family:   . Attends Religious Services:   . Active Member of Clubs or Organizations:   . Attends Archivist Meetings:   [name] Kitchen Marital Status:   Intimate Partner Violence:   . Fear of Current or Ex-Partner:   . Emotionally Abused:   [name] Kitchen Physically Abused:   . Sexually Abused:     FAMILY HISTORY: Family History  Problem Relation Age of Onset  . Blindness Mother   . Glaucoma Mother   . Diabetes Father   . Diabetes Brother     ALLERGIES:  has No Known Allergies.  MEDICATIONS:  Current Outpatient Medications  Medication Sig Dispense Refill  . OLANZapine (ZYPREXA) 10 MG tablet Take 1 tablet (10 mg total) by mouth at bedtime. 5 tablet 0  . ondansetron (ZOFRAN) 8 MG tablet Take 1 tablet (8 mg total) by mouth every 8 (eight) hours as needed for nausea or vomiting. 30 tablet 1  . prochlorperazine (COMPAZINE) 10 MG tablet Take 1 tablet (10 mg total) by mouth every 6  (six) hours as needed (Nausea or vomiting). (Patient not taking: Reported on 10/03/2019) 30 tablet 1   No current facility-administered medications for this visit.     PHYSICAL EXAMINATION: ECOG PERFORMANCE STATUS: 1 - Symptomatic but completely ambulatory Vitals:   10/10/19 0845  BP: 120/73  Pulse: 82  Resp: 18  Temp: (!) 97.3 F (36.3 C)   Filed Weights   10/10/19 0845  Weight: 185 lb 3.2 oz (84 kg)    Physical Exam Constitutional:      General: She is not in acute distress. HENT:     Head: Normocephalic and atraumatic.     Mouth/Throat:     Comments: Thrush Eyes:     General: No scleral icterus. Cardiovascular:     Rate and Rhythm: Normal rate and regular rhythm.     Heart sounds: Normal heart sounds.  Pulmonary:     Effort: Pulmonary effort is normal. No respiratory distress.     Breath sounds: No wheezing.  Abdominal:     General: Bowel sounds are normal. There is no distension.     Palpations: Abdomen is soft.  Musculoskeletal:  General: No deformity. Normal range of motion.     Cervical back: Normal range of motion and neck supple.  Skin:    General: Skin is warm and dry.     Findings: No erythema or rash.  Neurological:     Mental Status: She is alert and oriented to person, place, and time. Mental status is at baseline.     Cranial Nerves: No cranial nerve deficit.     Coordination: Coordination normal.  Psychiatric:        Mood and Affect: Mood normal.     LABORATORY DATA:  I have reviewed the data as listed Lab Results  Component Value Date   WBC 5.8 10/03/2019   HGB 13.3 10/03/2019   HCT 40.4 10/03/2019   MCV 89.8 10/03/2019   PLT 216 10/03/2019   Recent Labs    08/23/19 0915 10/03/19 0824  NA 141 140  K 4.0 4.1  CL 104 106  CO2 24 26  GLUCOSE 100* 104*  BUN 14 15  CREATININE 0.64 0.62  CALCIUM 9.2 9.0  GFRNONAA 99 >60  GFRAA 114 >60  PROT 7.6  --   ALBUMIN 4.5  --   AST 30  --   ALT 31  --   ALKPHOS 100  --   BILITOT  0.4  --    Iron/TIBC/Ferritin/ %Sat No results found for: IRON, TIBC, FERRITIN, IRONPCTSAT    RADIOGRAPHIC STUDIES: I have personally reviewed the radiological images as listed and agreed with the findings in the report. CT ANGIO NECK W OR WO CONTRAST  Result Date: 09/13/2019 CLINICAL DATA:  59 year old female with pulsatile right lateral neck mass with bruit. Query aneurysm or AV fistula. Patient reports increasing right lower neck pain and swelling. Diagnosed with cervical cancer 3 weeks ago. EXAM: CT ANGIOGRAPHY NECK TECHNIQUE: Multidetector CT imaging of the neck was performed using the standard protocol during bolus administration of intravenous contrast. Multiplanar CT image reconstructions and MIPs were obtained to evaluate the vascular anatomy. Carotid stenosis measurements (when applicable) are obtained utilizing NASCET criteria, using the distal internal carotid diameter as the denominator. CONTRAST:  1mL OMNIPAQUE IOHEXOL 350 MG/ML SOLN COMPARISON:  None. FINDINGS: Skeleton: No acute or suspicious osseous lesion identified. Visible bone mineralization appears normal. Upper chest: No superior mediastinal lymphadenopathy; there are subcentimeter prevascular nodes which are within normal limits. Visible lung apices are clear. Other neck: Negative thyroid. The glottis is closed. Laryngeal and pharyngeal soft tissue contours are within normal limits. Negative parapharyngeal and retropharyngeal spaces. Negative visible sublingual space, submandibular glands, and parotid glands. Negative visible masticator spaces. Negative visible posterior fossa. No cervical lymphadenopathy. No neck soft tissue mass. Aortic arch: 3 vessel arch configuration with minimal arch atherosclerosis. Right carotid system: Tortuous brachiocephalic artery and proximal right CCA without plaque or stenosis. Normal right carotid bifurcation. Negative cervical right ICA and visible proximal right ICA siphon. The right internal  jugular vein also appears normal. No evidence of vascular malformation in the right neck. Left carotid system: Left CCA is negative aside from mild tortuosity. Negative left carotid bifurcation. Tortuous left ICA with a partially retropharyngeal course, but otherwise normal to the skull base. Negative visible left ICA siphon. Vertebral arteries: Tortuous proximal right subclavian artery with a kinked appearance at the thoracic inlet (series 6, image 170). Mild calcified plaque at the right subclavian origin without stenosis. The right vertebral artery is highly diminutive but patent, with no plaque or stenosis identified to the skull base. The right vertebral remains diminutive in  the posterior fossa and appears to functionally terminates in PICA. Minimal plaque in the proximal left subclavian artery without stenosis. Dominant left vertebral artery with a normal origin and tortuous V1 segment. The left vertebral remains dominant and normal to the basilar. Normal left PICA origin. Negative visible basilar artery. Review of the MIP images confirms the above findings IMPRESSION: 1. Positive for generalized arterial tortuosity, but negative for vascular malformation, aneurysm, soft tissue mass, or metastatic disease in the neck. 2. Minimal atherosclerosis in the neck, no arterial stenosis. Dominant left vertebral artery (normal variant). Electronically Signed   By: Genevie Ann M.D.   On: 09/13/2019 20:40      ASSESSMENT & PLAN:  1. 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 ([place])   2. Encounter for antineoplastic chemotherapy   3. Non-intractable vomiting with nausea, unspecified vomiting type   4. Thrush    # At least Stage IIIc Cervix cancer.  Labs reviewed and discussed with patient  Counts acceptable.  She tolerates with mild to moderate difficulties Proceed with weekly cisplatin today.  #Chemotherapy-induced nausea.  Continue olanzapine at bedtime.  Discussed about using antiemetics Zofran and/or  Compazine for nausea symptoms.  Add dexamethasone 8 mg daily for 2 days after each chemotherapy treatments.  #Oral thrush, -to start nystatin oral rinse 4 times daily swish and spit.  #  Small hypermetabolic paratracheal and supraclavicular nodes, clinical significance unknown. Attention on follow up   Spanish interpreter present for the entire encounter for interpreting. We spent sufficient time to discuss many aspect of care, questions were answered to patient's satisfaction.   All questions were answered. The patient knows to call the clinic with any problems questions or concerns.  Return of visit:  1 week   Earlie Server, MD, PhD Hematology Oncology [facility] at [facility] Pager- [phone] 10/10/2019

## 2019-10-11 ENCOUNTER — Ambulatory Visit
Admission: RE | Admit: 2019-10-11 | Discharge: 2019-10-11 | Disposition: A | Discharge status: Home / Self Care | Payer: BC Managed Care – PPO | Source: Ambulatory Visit | Attending: Radiation Oncology | Admitting: Radiation Oncology

## 2019-10-11 ENCOUNTER — Telehealth: Payer: Self-pay

## 2019-10-11 DIAGNOSIS — C539 Malignant neoplasm of cervix uteri, unspecified: Secondary | ICD-10-CM | POA: Diagnosis not present

## 2019-10-11 NOTE — Telephone Encounter (Signed)
FMLA/ disablity benefits: HCP medical certification form completed and faxed to [facility] @ [employer].  Fax confirmation received  Fax: [phone]

## 2019-10-12 ENCOUNTER — Ambulatory Visit
Admission: RE | Admit: 2019-10-12 | Discharge: 2019-10-12 | Disposition: A | Discharge status: Home / Self Care | Payer: BC Managed Care – PPO | Source: Ambulatory Visit | Attending: Radiation Oncology | Admitting: Radiation Oncology

## 2019-10-12 DIAGNOSIS — C539 Malignant neoplasm of cervix uteri, unspecified: Secondary | ICD-10-CM | POA: Diagnosis not present

## 2019-10-13 ENCOUNTER — Ambulatory Visit
Admission: RE | Admit: 2019-10-13 | Discharge: 2019-10-13 | Disposition: A | Discharge status: Home / Self Care | Payer: BC Managed Care – PPO | Source: Ambulatory Visit | Attending: Radiation Oncology | Admitting: Radiation Oncology

## 2019-10-13 DIAGNOSIS — C539 Malignant neoplasm of cervix uteri, unspecified: Secondary | ICD-10-CM | POA: Diagnosis not present

## 2019-10-16 ENCOUNTER — Ambulatory Visit
Admission: RE | Admit: 2019-10-16 | Discharge: 2019-10-16 | Disposition: A | Discharge status: Home / Self Care | Payer: BC Managed Care – PPO | Source: Ambulatory Visit | Attending: Radiation Oncology | Admitting: Radiation Oncology

## 2019-10-16 ENCOUNTER — Other Ambulatory Visit: Payer: Self-pay | Admitting: *Deleted

## 2019-10-16 DIAGNOSIS — C539 Malignant neoplasm of cervix uteri, unspecified: Secondary | ICD-10-CM | POA: Diagnosis not present

## 2019-10-16 MED ORDER — NYSTATIN 100000 UNIT/ML MT SUSP: 5.0000 mL | Freq: Four times a day (QID) | OROMUCOSAL | 0 refills | Status: DC

## 2019-10-17 ENCOUNTER — Other Ambulatory Visit: Payer: Self-pay

## 2019-10-17 ENCOUNTER — Inpatient Hospital Stay (HOSPITAL_BASED_OUTPATIENT_CLINIC_OR_DEPARTMENT_OTHER): Payer: BC Managed Care – PPO | Admitting: Oncology

## 2019-10-17 ENCOUNTER — Inpatient Hospital Stay: Payer: BC Managed Care – PPO

## 2019-10-17 ENCOUNTER — Encounter: Payer: Self-pay | Admitting: Oncology

## 2019-10-17 ENCOUNTER — Ambulatory Visit
Admission: RE | Admit: 2019-10-17 | Discharge: 2019-10-17 | Disposition: A | Discharge status: Home / Self Care | Payer: BC Managed Care – PPO | Source: Ambulatory Visit | Attending: Radiation Oncology | Admitting: Radiation Oncology

## 2019-10-17 VITALS — BP 127/79 | HR 88 | Temp 97.0°F | Resp 18 | Wt 186.8 lb

## 2019-10-17 DIAGNOSIS — R112 Nausea with vomiting, unspecified: Secondary | ICD-10-CM

## 2019-10-17 DIAGNOSIS — C539 Malignant neoplasm of cervix uteri, unspecified: Secondary | ICD-10-CM | POA: Diagnosis not present

## 2019-10-17 DIAGNOSIS — Z7189 Other specified counseling: Secondary | ICD-10-CM

## 2019-10-17 DIAGNOSIS — C530 Malignant neoplasm of endocervix: Secondary | ICD-10-CM

## 2019-10-17 DIAGNOSIS — C538 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 uteri: Secondary | ICD-10-CM

## 2019-10-17 DIAGNOSIS — R12 Heartburn: Secondary | ICD-10-CM | POA: Diagnosis not present

## 2019-10-17 DIAGNOSIS — Z5111 Encounter for antineoplastic chemotherapy: Secondary | ICD-10-CM | POA: Diagnosis not present

## 2019-10-17 LAB — CBC WITH DIFFERENTIAL/PLATELET
Abs Immature Granulocytes: 0.02 10*3/uL (ref 0.00–0.07)
Basophils Absolute: 0 10*3/uL (ref 0.0–0.1)
Basophils Relative: 0 %
Eosinophils Absolute: 0.1 10*3/uL (ref 0.0–0.5)
Eosinophils Relative: 3 %
HCT: 36.1 % (ref 36.0–46.0)
Hemoglobin: 12.1 g/dL (ref 12.0–15.0)
Immature Granulocytes: 1 %
Lymphocytes Relative: 9 %
Lymphs Abs: 0.4 10*3/uL — ABNORMAL LOW (ref 0.7–4.0)
MCH: 30.2 pg (ref 26.0–34.0)
MCHC: 33.5 g/dL (ref 30.0–36.0)
MCV: 90 fL (ref 80.0–100.0)
Monocytes Absolute: 0.4 10*3/uL (ref 0.1–1.0)
Monocytes Relative: 9 %
Neutro Abs: 3.4 10*3/uL (ref 1.7–7.7)
Neutrophils Relative %: 78 %
Platelets: 126 10*3/uL — ABNORMAL LOW (ref 150–400)
RBC: 4.01 MIL/uL (ref 3.87–5.11)
RDW: 12.1 % (ref 11.5–15.5)
WBC: 4.3 10*3/uL (ref 4.0–10.5)
nRBC: 0 % (ref 0.0–0.2)

## 2019-10-17 LAB — COMPREHENSIVE METABOLIC PANEL
ALT: 51 U/L — ABNORMAL HIGH (ref 0–44)
AST: 28 U/L (ref 15–41)
Albumin: 3.6 g/dL (ref 3.5–5.0)
Alkaline Phosphatase: 73 U/L (ref 38–126)
Anion gap: 9 (ref 5–15)
BUN: 12 mg/dL (ref 6–20)
CO2: 27 mmol/L (ref 22–32)
Calcium: 8.6 mg/dL — ABNORMAL LOW (ref 8.9–10.3)
Chloride: 103 mmol/L (ref 98–111)
Creatinine, Ser: 0.76 mg/dL (ref 0.44–1.00)
GFR calc Af Amer: >60 mL/min (ref >60)
GFR calc non Af Amer: >60 mL/min (ref >60)
Glucose, Bld: 143 mg/dL — ABNORMAL HIGH (ref 70–99)
Potassium: 3.8 mmol/L (ref 3.5–5.1)
Sodium: 139 mmol/L (ref 135–145)
Total Bilirubin: 0.7 mg/dL (ref 0.3–1.2)
Total Protein: 6.8 g/dL (ref 6.5–8.1)

## 2019-10-17 MED ORDER — PALONOSETRON HCL INJECTION 0.25 MG/5ML
0.2500 mg | Freq: Once | INTRAVENOUS | Status: AC
  Administered 2019-10-17: 0.25 mg via INTRAVENOUS
  Filled 2019-10-17: qty 5

## 2019-10-17 MED ORDER — OMEPRAZOLE 20 MG PO CPDR
20.0000 mg | DELAYED_RELEASE_CAPSULE | Freq: Every day | ORAL | 1 refills | Status: DC
Start: 2019-10-17 — End: 2019-12-11

## 2019-10-17 MED ORDER — SODIUM CHLORIDE 0.9 % IV SOLN
40.0000 mg/m2 | Freq: Once | INTRAVENOUS | Status: AC
  Administered 2019-10-17: 77 mg via INTRAVENOUS
  Filled 2019-10-17: qty 77

## 2019-10-17 MED ORDER — SODIUM CHLORIDE 0.9 % IV SOLN
Freq: Once | INTRAVENOUS | Status: AC
  Filled 2019-10-17: qty 250

## 2019-10-17 MED ORDER — SODIUM CHLORIDE 0.9 % IV SOLN
10.0000 mg | Freq: Once | INTRAVENOUS | Status: AC
  Administered 2019-10-17: 10 mg via INTRAVENOUS
  Filled 2019-10-17: qty 10

## 2019-10-17 MED ORDER — POTASSIUM CHLORIDE 2 MEQ/ML IV SOLN
Freq: Once | INTRAVENOUS | Status: AC
  Filled 2019-10-17: qty 1000

## 2019-10-17 MED ORDER — SODIUM CHLORIDE 0.9 % IV SOLN
150.0000 mg | Freq: Once | INTRAVENOUS | Status: AC
  Administered 2019-10-17: 150 mg via INTRAVENOUS
  Filled 2019-10-17: qty 150

## 2019-10-17 NOTE — Progress Notes (Signed)
Patient here for follow up. Pt reports having some heartburn (new) after she eats. Mild nausea after treatments.

## 2019-10-17 NOTE — Progress Notes (Signed)
Hematology/Oncology follow up note [facility] Telephone:(336) [phone] Fax:(336) [phone]   Patient Care Team: Crissie Figures, PA-C as PCP - General (Physician Assistant) Clent Jacks, RN as Oncology Nurse Navigator  REFERRING PROVIDER: Crissie Figures, PA-C  CHIEF COMPLAINTS/REASON FOR VISIT:  Follow up for cervical cancer.   HISTORY OF PRESENTING ILLNESS:   Jill Rodriguez is a  59 y.o.  female with PMH listed below was seen in consultation at the request of  Crissie Figures, PA-C  for evaluation of cervical cancer.   Patient is G3, P3 Spanish-speaking postmenopausal female who initially presented with vaginal bleeding.  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  No intervention seen.  08/23/2019, pelvis ultrasound showed endometrium measures 8 mm.  Echogenic endometrial microcalcification seen throughout endometrium.  Hypoechoic area in the fundal aspect of the endometrium measures 0.8 x 1.1 x 2.3 cm Survey of the adnexa showed no adnexal masses.  Patient has history of prominent right neck pelvis and CT angio neck has been ordered. Patient was seen by Dr. Fransisca Connors, clinically the tumor is 3 cm and replaced whole cervix. 09/06/2019 PET scan showed hypermetabolic mass involving the cervix and the lower uterine segment consistent with known cervical cancer. Multiple small hypermetabolic retroperitoneal and pelvic lymph node bilaterally consistent with nodal metastasis. She also has small hypermetabolic lymph nodes in the left superior mediastinum and left supraclavicular regions are also suspicious for nodal metastasis.  Hepatic steatosis no cholelithiasis noted.  Today patient was accompanied by daughter to discuss management plan. Patient reports lower  abdominal pain.  Vaginal bleeding has stopped since the biopsy.  INTERVAL HISTORY Jill Rodriguez is a 59 y.o. female who has above history reviewed by me today presents for follow up visit for management of at least stage IIIc cervical cancer. Problems and complaints are listed below: Patient is accompanied by her daughter. Patient is currently on concurrent chemotherapy with cisplatin weekly and radiation. Status post 2 cisplatin treatments Patient prefers her daughter to be the interpreter. Nausea has improved with olanzapine and dexamethasone. Reports heartburn symptoms.  Review of Systems  Constitutional: Positive for fatigue. Negative for appetite change, chills and fever.  HENT:   Negative for hearing loss and voice change.   Eyes: Negative for eye problems.  Respiratory: Negative for chest tightness and cough.   Cardiovascular: Negative for chest pain.  Gastrointestinal: Negative for abdominal distention, abdominal pain, blood in stool and nausea.       Heartburn  Endocrine: Negative for hot flashes.  Genitourinary: Negative for difficulty urinating and frequency.   Musculoskeletal: Negative for arthralgias.  Skin: Negative for itching and rash.  Neurological: Negative for extremity weakness.  Hematological: Negative for adenopathy.  Psychiatric/Behavioral: Negative for confusion.    MEDICAL HISTORY:  Past Medical History:  Diagnosis Date  . Cancer ([place])   . COVID-19 10/2018  . No pertinent past medical history     SURGICAL HISTORY: Past Surgical History:  Procedure Laterality Date  . no surgical history      SOCIAL HISTORY: Social History   Socioeconomic History  . Marital status: Married    Spouse name: Not on file  . Number of children: Not on file  . Years of education: Not on file  . Highest education level: Not on file  Occupational History  . Not on file  Tobacco Use  . Smoking status: Never Smoker  . Smokeless tobacco: Never Used  Vaping  Use  . Vaping Use: Never used  Substance and Sexual Activity  . Alcohol use: Never  . Drug use: Never  . Sexual activity: Yes    Birth control/protection: None  Other Topics Concern  . Not on file  Social History Narrative  . Not on file   Social Determinants of Health   Financial Resource Strain:   . Difficulty of Paying Living Expenses:   Food Insecurity:   . Worried About Charity fundraiser in the Last Year:   . Arboriculturist in the Last Year:   Transportation Needs:   . Film/video editor (Medical):   [name] Kitchen Lack of Transportation (Non-Medical):   Physical Activity:   . Days of Exercise per Week:   . Minutes of Exercise per Session:   Stress:   . Feeling of Stress :   Social Connections:   . Frequency of Communication with Friends and Family:   . Frequency of Social Gatherings with Friends and Family:   . Attends Religious Services:   . Active Member of Clubs or Organizations:   . Attends Archivist Meetings:   [name] Kitchen Marital Status:   Intimate Partner Violence:   . Fear of Current or Ex-Partner:   . Emotionally Abused:   [name] Kitchen Physically Abused:   . Sexually Abused:     FAMILY HISTORY: Family History  Problem Relation Age of Onset  . Blindness Mother   . Glaucoma Mother   . Diabetes Father   . Diabetes Brother     ALLERGIES:  has No Known Allergies.  MEDICATIONS:  Current Outpatient Medications  Medication Sig Dispense Refill  . dexamethasone (DECADRON) 4 MG tablet Take 2 tablets (8 mg total) by mouth See admin instructions. Take 8mg  daily for 2 days after each chemotherapy-cisplatin treatments. 24 tablet 0  . nystatin (MYCOSTATIN) 100000 UNIT/ML suspension Use as directed 5 mLs (500,000 Units total) in the mouth or throat 4 (four) times daily. 120 mL 0  . OLANZapine (ZYPREXA) 10 MG tablet Take 1 tablet (10 mg total) by mouth at bedtime. 30 tablet 0  . ondansetron (ZOFRAN) 8 MG tablet Take 1 tablet (8 mg total) by mouth every 8 (eight) hours as needed  for nausea or vomiting. 30 tablet 1  . prochlorperazine (COMPAZINE) 10 MG tablet Take 1 tablet (10 mg total) by mouth every 6 (six) hours as needed (Nausea or vomiting). 30 tablet 1   No current facility-administered medications for this visit.     PHYSICAL EXAMINATION: ECOG PERFORMANCE STATUS: 1 - Symptomatic but completely ambulatory Vitals:   10/17/19 0922  BP: 127/79  Pulse: 88  Resp: 18  Temp: (!) 97 F (36.1 C)   Filed Weights   10/17/19 0922  Weight: 186 lb 12.8 oz (84.7 kg)    Physical Exam Constitutional:      General: She is not in acute distress. HENT:     Head: Normocephalic and atraumatic.  Eyes:     General: No scleral icterus. Cardiovascular:     Rate and Rhythm: Normal rate and regular rhythm.     Heart sounds: Normal heart sounds.  Pulmonary:     Effort: Pulmonary effort is normal. No respiratory distress.     Breath sounds: No wheezing.  Abdominal:     General: Bowel sounds are normal. There is no distension.     Palpations: Abdomen is soft.  Musculoskeletal:  General: No deformity. Normal range of motion.     Cervical back: Normal range of motion and neck supple.  Skin:    General: Skin is warm and dry.     Findings: No erythema or rash.  Neurological:     Mental Status: She is alert and oriented to person, place, and time. Mental status is at baseline.     Cranial Nerves: No cranial nerve deficit.     Coordination: Coordination normal.  Psychiatric:        Mood and Affect: Mood normal.     LABORATORY DATA:  I have reviewed the data as listed Lab Results  Component Value Date   WBC 3.9 (L) 10/10/2019   HGB 13.1 10/10/2019   HCT 39.6 10/10/2019   MCV 90.0 10/10/2019   PLT 188 10/10/2019   Recent Labs    08/23/19 0915 10/03/19 0824 10/10/19 0829  NA 141 140 140  K 4.0 4.1 3.6  CL 104 106 102  CO2 24 26 30   GLUCOSE 100* 104* 129*  BUN 14 15 16   CREATININE 0.64 0.62 0.64  CALCIUM 9.2 9.0 9.0  GFRNONAA 99 >60 >60  GFRAA  114 >60 >60  PROT 7.6  --  7.5  ALBUMIN 4.5  --  3.9  AST 30  --  28  ALT 31  --  38  ALKPHOS 100  --  68  BILITOT 0.4  --  0.7   Iron/TIBC/Ferritin/ %Sat No results found for: IRON, TIBC, FERRITIN, IRONPCTSAT    RADIOGRAPHIC STUDIES: I have personally reviewed the radiological images as listed and agreed with the findings in the report. No results found.    ASSESSMENT & PLAN:  1. 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 ([place])   2. Encounter for antineoplastic chemotherapy   3. Heartburn   4. Non-intractable vomiting with nausea, unspecified vomiting type    # At least Stage IIIc Cervix cancer.  Labs are reviewed and discussed with patient. Acceptable to proceed with cycle 3 cisplatin.  #Chemotherapy-induced nausea.  Continue olanzapine at bedtime.  Continue dexamethasone for 2 days after each cisplatin treatments. Symptoms are better controlled.  #Oral thrush, improved.  Continue nystatin oral rinse 4 times daily swish and spit. #Heartburn, likely GERD.  Advised patient to start taking omeprazole 20 mg daily. #  Small hypermetabolic paratracheal and supraclavicular nodes, clinical significance unknown. Attention on follow up   Spanish interpreter present for the entire encounter for interpreting. We spent sufficient time to discuss many aspect of care, questions were answered to patient's satisfaction.   All questions were answered. The patient knows to call the clinic with any problems questions or concerns.  Return of visit:  1 week   Earlie Server, MD, PhD Hematology Oncology [facility] at [facility] Pager- [phone] 10/17/2019

## 2019-10-18 ENCOUNTER — Ambulatory Visit
Admission: RE | Admit: 2019-10-18 | Discharge: 2019-10-18 | Disposition: A | Discharge status: Home / Self Care | Payer: BC Managed Care – PPO | Source: Ambulatory Visit | Attending: Radiation Oncology | Admitting: Radiation Oncology

## 2019-10-18 ENCOUNTER — Other Ambulatory Visit: Payer: Self-pay

## 2019-10-18 DIAGNOSIS — C539 Malignant neoplasm of cervix uteri, unspecified: Secondary | ICD-10-CM | POA: Diagnosis not present

## 2019-10-18 DIAGNOSIS — C538 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 uteri: Secondary | ICD-10-CM

## 2019-10-19 ENCOUNTER — Ambulatory Visit
Admission: RE | Admit: 2019-10-19 | Discharge: 2019-10-19 | Disposition: A | Discharge status: Home / Self Care | Payer: BC Managed Care – PPO | Source: Ambulatory Visit | Attending: Radiation Oncology | Admitting: Radiation Oncology

## 2019-10-19 DIAGNOSIS — C539 Malignant neoplasm of cervix uteri, unspecified: Secondary | ICD-10-CM | POA: Diagnosis not present

## 2019-10-20 ENCOUNTER — Ambulatory Visit
Admission: RE | Admit: 2019-10-20 | Discharge: 2019-10-20 | Disposition: A | Discharge status: Home / Self Care | Payer: BC Managed Care – PPO | Source: Ambulatory Visit | Attending: Radiation Oncology | Admitting: Radiation Oncology

## 2019-10-20 DIAGNOSIS — C539 Malignant neoplasm of cervix uteri, unspecified: Secondary | ICD-10-CM | POA: Diagnosis not present

## 2019-10-23 ENCOUNTER — Ambulatory Visit
Admission: RE | Admit: 2019-10-23 | Discharge: 2019-10-23 | Disposition: A | Discharge status: Home / Self Care | Payer: BC Managed Care – PPO | Source: Ambulatory Visit | Attending: Radiation Oncology | Admitting: Radiation Oncology

## 2019-10-23 DIAGNOSIS — C539 Malignant neoplasm of cervix uteri, unspecified: Secondary | ICD-10-CM | POA: Diagnosis not present

## 2019-10-24 ENCOUNTER — Inpatient Hospital Stay: Payer: BC Managed Care – PPO

## 2019-10-24 ENCOUNTER — Other Ambulatory Visit: Payer: Self-pay

## 2019-10-24 ENCOUNTER — Inpatient Hospital Stay (HOSPITAL_BASED_OUTPATIENT_CLINIC_OR_DEPARTMENT_OTHER): Payer: BC Managed Care – PPO | Admitting: Oncology

## 2019-10-24 ENCOUNTER — Encounter: Payer: Self-pay | Admitting: Oncology

## 2019-10-24 ENCOUNTER — Ambulatory Visit
Admission: RE | Admit: 2019-10-24 | Discharge: 2019-10-24 | Disposition: A | Discharge status: Home / Self Care | Payer: BC Managed Care – PPO | Source: Ambulatory Visit | Attending: Radiation Oncology | Admitting: Radiation Oncology

## 2019-10-24 VITALS — BP 119/76 | HR 80 | Temp 97.3°F | Resp 16 | Wt 184.4 lb

## 2019-10-24 DIAGNOSIS — C538 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 uteri: Secondary | ICD-10-CM | POA: Diagnosis not present

## 2019-10-24 DIAGNOSIS — C530 Malignant neoplasm of endocervix: Secondary | ICD-10-CM

## 2019-10-24 DIAGNOSIS — Z5111 Encounter for antineoplastic chemotherapy: Secondary | ICD-10-CM | POA: Diagnosis not present

## 2019-10-24 DIAGNOSIS — R112 Nausea with vomiting, unspecified: Secondary | ICD-10-CM | POA: Diagnosis not present

## 2019-10-24 DIAGNOSIS — K5903 Drug induced constipation: Secondary | ICD-10-CM

## 2019-10-24 DIAGNOSIS — Z7189 Other specified counseling: Secondary | ICD-10-CM

## 2019-10-24 DIAGNOSIS — C539 Malignant neoplasm of cervix uteri, unspecified: Secondary | ICD-10-CM | POA: Diagnosis not present

## 2019-10-24 LAB — CBC WITH DIFFERENTIAL/PLATELET
Abs Immature Granulocytes: 0.02 10*3/uL (ref 0.00–0.07)
Basophils Absolute: 0 10*3/uL (ref 0.0–0.1)
Basophils Relative: 1 %
Eosinophils Absolute: 0.2 10*3/uL (ref 0.0–0.5)
Eosinophils Relative: 5 %
HCT: 34.3 % — ABNORMAL LOW (ref 36.0–46.0)
Hemoglobin: 11.6 g/dL — ABNORMAL LOW (ref 12.0–15.0)
Immature Granulocytes: 1 %
Lymphocytes Relative: 9 %
Lymphs Abs: 0.3 10*3/uL — ABNORMAL LOW (ref 0.7–4.0)
MCH: 30.4 pg (ref 26.0–34.0)
MCHC: 33.8 g/dL (ref 30.0–36.0)
MCV: 89.8 fL (ref 80.0–100.0)
Monocytes Absolute: 0.4 10*3/uL (ref 0.1–1.0)
Monocytes Relative: 13 %
Neutro Abs: 2.1 10*3/uL (ref 1.7–7.7)
Neutrophils Relative %: 71 %
Platelets: 140 10*3/uL — ABNORMAL LOW (ref 150–400)
RBC: 3.82 MIL/uL — ABNORMAL LOW (ref 3.87–5.11)
RDW: 12.3 % (ref 11.5–15.5)
WBC: 3 10*3/uL — ABNORMAL LOW (ref 4.0–10.5)
nRBC: 0 % (ref 0.0–0.2)

## 2019-10-24 LAB — COMPREHENSIVE METABOLIC PANEL
ALT: 30 U/L (ref 0–44)
AST: 24 U/L (ref 15–41)
Albumin: 3.6 g/dL (ref 3.5–5.0)
Alkaline Phosphatase: 64 U/L (ref 38–126)
Anion gap: 8 (ref 5–15)
BUN: 15 mg/dL (ref 6–20)
CO2: 26 mmol/L (ref 22–32)
Calcium: 8.5 mg/dL — ABNORMAL LOW (ref 8.9–10.3)
Chloride: 102 mmol/L (ref 98–111)
Creatinine, Ser: 0.69 mg/dL (ref 0.44–1.00)
GFR calc Af Amer: >60 mL/min (ref >60)
GFR calc non Af Amer: >60 mL/min (ref >60)
Glucose, Bld: 155 mg/dL — ABNORMAL HIGH (ref 70–99)
Potassium: 3.7 mmol/L (ref 3.5–5.1)
Sodium: 136 mmol/L (ref 135–145)
Total Bilirubin: 0.6 mg/dL (ref 0.3–1.2)
Total Protein: 6.9 g/dL (ref 6.5–8.1)

## 2019-10-24 MED ORDER — POTASSIUM CHLORIDE 2 MEQ/ML IV SOLN
Freq: Once | INTRAVENOUS | Status: AC
  Filled 2019-10-24: qty 1000

## 2019-10-24 MED ORDER — SODIUM CHLORIDE 0.9 % IV SOLN
Freq: Once | INTRAVENOUS | Status: AC
  Filled 2019-10-24: qty 250

## 2019-10-24 MED ORDER — SODIUM CHLORIDE 0.9 % IV SOLN
150.0000 mg | Freq: Once | INTRAVENOUS | Status: AC
  Administered 2019-10-24: 150 mg via INTRAVENOUS
  Filled 2019-10-24: qty 150

## 2019-10-24 MED ORDER — SODIUM CHLORIDE 0.9 % IV SOLN
40.0000 mg/m2 | Freq: Once | INTRAVENOUS | Status: AC
  Administered 2019-10-24: 77 mg via INTRAVENOUS
  Filled 2019-10-24: qty 77

## 2019-10-24 MED ORDER — PALONOSETRON HCL INJECTION 0.25 MG/5ML
0.2500 mg | Freq: Once | INTRAVENOUS | Status: AC
  Administered 2019-10-24: 0.25 mg via INTRAVENOUS
  Filled 2019-10-24: qty 5

## 2019-10-24 MED ORDER — SODIUM CHLORIDE 0.9 % IV SOLN
10.0000 mg | Freq: Once | INTRAVENOUS | Status: AC
  Administered 2019-10-24: 10 mg via INTRAVENOUS
  Filled 2019-10-24: qty 10

## 2019-10-24 MED ORDER — SENNA 8.6 MG PO TABS: 2.0000 | ORAL_TABLET | Freq: Every day | ORAL | 0 refills | Status: DC

## 2019-10-24 NOTE — Progress Notes (Signed)
Patient is constipated with last bowel movement 6 days ago and started Miralax yesterday.  Does have episodes of nausea which was relieved with the Zyprexa but she stopped thinking it may have caused constipation.  She is feeling weak/fatigued and does not have an appetite but she does force herself to eat.

## 2019-10-24 NOTE — Progress Notes (Signed)
Hematology/Oncology follow up note [facility] Telephone:(336) [phone] Fax:([phone]   Patient Care Team: Crissie Figures, PA-C as PCP - General (Physician Assistant) Clent Jacks, RN as Oncology Nurse Navigator  REFERRING PROVIDER: Crissie Figures, PA-C  CHIEF COMPLAINTS/REASON FOR VISIT:  Follow up for cervical cancer.   HISTORY OF PRESENTING ILLNESS:   Jill Rodriguez is a  59 y.o.  female with PMH listed below was seen in consultation at the request of  Crissie Figures, PA-C  for evaluation of cervical cancer.   Patient is G3, P3 Spanish-speaking postmenopausal female who initially presented with vaginal bleeding.  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  No intervention seen.  08/23/2019, pelvis ultrasound showed endometrium measures 8 mm.  Echogenic endometrial microcalcification seen throughout endometrium.  Hypoechoic area in the fundal aspect of the endometrium measures 0.8 x 1.1 x 2.3 cm Survey of the adnexa showed no adnexal masses.  Patient has history of prominent right neck pelvis and CT angio neck has been ordered. Patient was seen by Dr. Fransisca Connors, clinically the tumor is 3 cm and replaced whole cervix. 09/06/2019 PET scan showed hypermetabolic mass involving the cervix and the lower uterine segment consistent with known cervical cancer. Multiple small hypermetabolic retroperitoneal and pelvic lymph node bilaterally consistent with nodal metastasis. She also has small hypermetabolic lymph nodes in the left superior mediastinum and left supraclavicular regions are also suspicious for nodal metastasis.  Hepatic steatosis no cholelithiasis noted.  Today patient was accompanied by daughter to discuss management plan. Patient reports lower  abdominal pain.  Vaginal bleeding has stopped since the biopsy.  INTERVAL HISTORY Jill Rodriguez is a 59 y.o. female who has above history reviewed by me today presents for follow up visit for management of at least stage IIIc cervical cancer. Problems and complaints are listed below: Patient is accompanied by her daughter. Patient prefers to use her daughter as interpreter. Patient reports some nausea.  She recently stopped Zyprexa, antiemetics as she did not feel nausea, also is concerned about Zyprexa may have caused constipation..  Constipation, using MiraLAX as needed. Patient has been on concurrent chemotherapy with cisplatin weekly and radiation. She reports fatigue and tired. Appetite is poor.  Her weight has decreased 2 pounds since last week  Review of Systems  Constitutional: Positive for fatigue. Negative for appetite change, chills and fever.  HENT:   Negative for hearing loss and voice change.   Eyes: Negative for eye problems.  Respiratory: Negative for chest tightness and cough.   Cardiovascular: Negative for chest pain.  Gastrointestinal: Positive for nausea. Negative for abdominal distention, abdominal pain and blood in stool.  Endocrine: Negative for hot flashes.  Genitourinary: Negative for difficulty urinating and frequency.   Musculoskeletal: Negative for arthralgias.  Skin: Negative for itching and rash.  Neurological: Negative for extremity weakness.  Hematological: Negative for adenopathy.  Psychiatric/Behavioral: Negative for confusion.    MEDICAL HISTORY:  Past Medical History:  Diagnosis Date  . Cancer ([place])   . COVID-19 10/2018  . No pertinent past medical history     SURGICAL HISTORY: Past Surgical History:  Procedure Laterality Date  . no surgical history      SOCIAL HISTORY: Social History   Socioeconomic History  . Marital status: Married    Spouse name: Not on file  . Number  of children: Not on file  . Years of education: Not on  file  . Highest education level: Not on file  Occupational History  . Not on file  Tobacco Use  . Smoking status: Never Smoker  . Smokeless tobacco: Never Used  Vaping Use  . Vaping Use: Never used  Substance and Sexual Activity  . Alcohol use: Never  . Drug use: Never  . Sexual activity: Yes    Birth control/protection: None  Other Topics Concern  . Not on file  Social History Narrative  . Not on file   Social Determinants of Health   Financial Resource Strain:   . Difficulty of Paying Living Expenses:   Food Insecurity:   . Worried About Charity fundraiser in the Last Year:   . Arboriculturist in the Last Year:   Transportation Needs:   . Film/video editor (Medical):   [name] Kitchen Lack of Transportation (Non-Medical):   Physical Activity:   . Days of Exercise per Week:   . Minutes of Exercise per Session:   Stress:   . Feeling of Stress :   Social Connections:   . Frequency of Communication with Friends and Family:   . Frequency of Social Gatherings with Friends and Family:   . Attends Religious Services:   . Active Member of Clubs or Organizations:   . Attends Archivist Meetings:   [name] Kitchen Marital Status:   Intimate Partner Violence:   . Fear of Current or Ex-Partner:   . Emotionally Abused:   [name] Kitchen Physically Abused:   . Sexually Abused:     FAMILY HISTORY: Family History  Problem Relation Age of Onset  . Blindness Mother   . Glaucoma Mother   . Diabetes Father   . Diabetes Brother     ALLERGIES:  has No Known Allergies.  MEDICATIONS:  Current Outpatient Medications  Medication Sig Dispense Refill  . dexamethasone (DECADRON) 4 MG tablet Take 2 tablets (8 mg total) by mouth See admin instructions. Take 8mg  daily for 2 days after each chemotherapy-cisplatin treatments. 24 tablet 0  . nystatin (MYCOSTATIN) 100000 UNIT/ML suspension Use as directed 5 mLs (500,000 Units total) in the mouth or throat 4 (four) times daily. 120 mL 0  . OLANZapine (ZYPREXA) 10  MG tablet Take 1 tablet (10 mg total) by mouth at bedtime. 30 tablet 0  . omeprazole (PRILOSEC) 20 MG capsule Take 1 capsule (20 mg total) by mouth daily. 30 capsule 1  . ondansetron (ZOFRAN) 8 MG tablet Take 1 tablet (8 mg total) by mouth every 8 (eight) hours as needed for nausea or vomiting. 30 tablet 1  . prochlorperazine (COMPAZINE) 10 MG tablet Take 1 tablet (10 mg total) by mouth every 6 (six) hours as needed (Nausea or vomiting). 30 tablet 1   No current facility-administered medications for this visit.     PHYSICAL EXAMINATION: ECOG PERFORMANCE STATUS: 1 - Symptomatic but completely ambulatory Vitals:   10/24/19 0853  BP: 119/76  Pulse: 80  Resp: 16  Temp: (!) 97.3 F (36.3 C)   Filed Weights   10/24/19 0853  Weight: 184 lb 6.4 oz (83.6 kg)    Physical Exam Constitutional:      General: She is not in acute distress. HENT:     Head: Normocephalic and atraumatic.  Eyes:     General: No scleral icterus. Cardiovascular:     Rate and Rhythm: Normal rate and regular rhythm.     Heart sounds: Normal heart  sounds.  Pulmonary:     Effort: Pulmonary effort is normal. No respiratory distress.     Breath sounds: No wheezing.  Abdominal:     General: Bowel sounds are normal. There is no distension.     Palpations: Abdomen is soft.  Musculoskeletal:        General: No deformity. Normal range of motion.     Cervical back: Normal range of motion and neck supple.  Skin:    General: Skin is warm and dry.     Findings: No erythema or rash.  Neurological:     Mental Status: She is alert and oriented to person, place, and time. Mental status is at baseline.     Cranial Nerves: No cranial nerve deficit.     Coordination: Coordination normal.  Psychiatric:        Mood and Affect: Mood normal.     LABORATORY DATA:  I have reviewed the data as listed Lab Results  Component Value Date   WBC 3.0 (L) 10/24/2019   HGB 11.6 (L) 10/24/2019   HCT 34.3 (L) 10/24/2019   MCV 89.8  10/24/2019   PLT 140 (L) 10/24/2019   Recent Labs    08/23/19 0915 08/23/19 0915 10/03/19 0824 10/10/19 0829 10/17/19 0830  NA 141  --  140 140 139  K 4.0   < > 4.1 3.6 3.8  CL 104   < > 106 102 103  CO2 24   < > 26 30 27   GLUCOSE 100*  --  104* 129* 143*  BUN 14  --  15 16 12   CREATININE 0.64   < > 0.62 0.64 0.76  CALCIUM 9.2   < > 9.0 9.0 8.6*  GFRNONAA 99   < > >60 >60 >60  GFRAA 114   < > >60 >60 >60  PROT 7.6  --   --  7.5 6.8  ALBUMIN 4.5  --   --  3.9 3.6  AST 30  --   --  28 28  ALT 31  --   --  38 51*  ALKPHOS 100  --   --  68 73  BILITOT 0.4  --   --  0.7 0.7   < > = values in this interval not displayed.   Iron/TIBC/Ferritin/ %Sat No results found for: IRON, TIBC, FERRITIN, IRONPCTSAT    RADIOGRAPHIC STUDIES: I have personally reviewed the radiological images as listed and agreed with the findings in the report. No results found.    ASSESSMENT & PLAN:  1. 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 ([place])   2. Encounter for antineoplastic chemotherapy   3. Non-intractable vomiting with nausea, unspecified vomiting type   4. Drug-induced constipation    # At least Stage IIIc Cervix cancer.  Labs are reviewed and discussed with patient. Acceptable to proceed with cycle 4 cisplatin.  #Chemotherapy-induced nausea.  I advised patient to continue olanzapine every night.  Scheduled regardless of nausea symptoms or not.  It appears that when she stops olanzapine, her nausea symptoms return.  She may use other antiemetics as needed. #Constipation may be secondary to olanzapine.  Recommend patient to start Senokot 2 tablets daily.  She may also use MiraLAX daily as needed if no improvement after taking Senokot.[name] Kitchen  #Oral thrush, improved.  Refill nystatin oral rinse 4 times daily swish and spit. #Heartburn, likely GERD.  Advised patient to start taking omeprazole 20 mg daily.  Continue #  Small hypermetabolic paratracheal and supraclavicular nodes, clinical  significance unknown. Attention on follow up    We spent sufficient time to discuss many aspect of care, questions were answered to patient's satisfaction.   All questions were answered. The patient knows to call the clinic with any problems questions or concerns.  Return of visit:  1 week   Earlie Server, MD, PhD Hematology Oncology [facility] at [facility] Pager- [phone] 10/24/2019

## 2019-10-25 ENCOUNTER — Other Ambulatory Visit: Payer: Self-pay | Admitting: *Deleted

## 2019-10-25 ENCOUNTER — Ambulatory Visit
Admission: RE | Admit: 2019-10-25 | Discharge: 2019-10-25 | Disposition: A | Discharge status: Home / Self Care | Payer: BC Managed Care – PPO | Source: Ambulatory Visit | Attending: Radiation Oncology | Admitting: Radiation Oncology

## 2019-10-25 DIAGNOSIS — C539 Malignant neoplasm of cervix uteri, unspecified: Secondary | ICD-10-CM | POA: Diagnosis not present

## 2019-10-25 MED ORDER — NYSTATIN 100000 UNIT/ML MT SUSP: 5.0000 mL | Freq: Four times a day (QID) | OROMUCOSAL | 0 refills | Status: DC

## 2019-10-26 ENCOUNTER — Ambulatory Visit
Admission: RE | Admit: 2019-10-26 | Discharge: 2019-10-26 | Disposition: A | Discharge status: Home / Self Care | Payer: BC Managed Care – PPO | Source: Ambulatory Visit | Attending: Radiation Oncology | Admitting: Radiation Oncology

## 2019-10-26 DIAGNOSIS — C539 Malignant neoplasm of cervix uteri, unspecified: Secondary | ICD-10-CM | POA: Insufficient documentation

## 2019-10-27 ENCOUNTER — Ambulatory Visit
Admission: RE | Admit: 2019-10-27 | Discharge: 2019-10-27 | Disposition: A | Discharge status: Home / Self Care | Payer: BC Managed Care – PPO | Source: Ambulatory Visit | Attending: Radiation Oncology | Admitting: Radiation Oncology

## 2019-10-27 DIAGNOSIS — C539 Malignant neoplasm of cervix uteri, unspecified: Secondary | ICD-10-CM | POA: Diagnosis not present

## 2019-10-31 ENCOUNTER — Inpatient Hospital Stay: Payer: BC Managed Care – PPO

## 2019-10-31 ENCOUNTER — Ambulatory Visit
Admission: RE | Admit: 2019-10-31 | Discharge: 2019-10-31 | Disposition: A | Discharge status: Home / Self Care | Payer: BC Managed Care – PPO | Source: Ambulatory Visit | Attending: Radiation Oncology | Admitting: Radiation Oncology

## 2019-10-31 ENCOUNTER — Encounter: Payer: Self-pay | Admitting: Oncology

## 2019-10-31 ENCOUNTER — Inpatient Hospital Stay: Payer: BC Managed Care – PPO | Attending: Oncology

## 2019-10-31 ENCOUNTER — Other Ambulatory Visit: Payer: Self-pay

## 2019-10-31 ENCOUNTER — Inpatient Hospital Stay (HOSPITAL_BASED_OUTPATIENT_CLINIC_OR_DEPARTMENT_OTHER): Payer: BC Managed Care – PPO | Admitting: Oncology

## 2019-10-31 ENCOUNTER — Telehealth: Payer: Self-pay | Admitting: *Deleted

## 2019-10-31 VITALS — BP 122/80 | HR 106 | Temp 98.1°F | Resp 16 | Wt 182.8 lb

## 2019-10-31 DIAGNOSIS — R112 Nausea with vomiting, unspecified: Secondary | ICD-10-CM | POA: Diagnosis not present

## 2019-10-31 DIAGNOSIS — R12 Heartburn: Secondary | ICD-10-CM | POA: Diagnosis not present

## 2019-10-31 DIAGNOSIS — R300 Dysuria: Secondary | ICD-10-CM | POA: Diagnosis not present

## 2019-10-31 DIAGNOSIS — Z5111 Encounter for antineoplastic chemotherapy: Secondary | ICD-10-CM | POA: Insufficient documentation

## 2019-10-31 DIAGNOSIS — T451X5A Adverse effect of antineoplastic and immunosuppressive drugs, initial encounter: Secondary | ICD-10-CM | POA: Insufficient documentation

## 2019-10-31 DIAGNOSIS — E860 Dehydration: Secondary | ICD-10-CM | POA: Diagnosis not present

## 2019-10-31 DIAGNOSIS — C530 Malignant neoplasm of endocervix: Secondary | ICD-10-CM | POA: Diagnosis present

## 2019-10-31 DIAGNOSIS — B9620 Unspecified Escherichia coli [E. coli] as the cause of diseases classified elsewhere: Secondary | ICD-10-CM | POA: Diagnosis not present

## 2019-10-31 DIAGNOSIS — B370 Candidal stomatitis: Secondary | ICD-10-CM | POA: Insufficient documentation

## 2019-10-31 DIAGNOSIS — N390 Urinary tract infection, site not specified: Secondary | ICD-10-CM | POA: Insufficient documentation

## 2019-10-31 DIAGNOSIS — C539 Malignant neoplasm of cervix uteri, unspecified: Secondary | ICD-10-CM | POA: Diagnosis not present

## 2019-10-31 DIAGNOSIS — D701 Agranulocytosis secondary to cancer chemotherapy: Secondary | ICD-10-CM | POA: Insufficient documentation

## 2019-10-31 HISTORY — DX: Nausea with vomiting, unspecified: R11.2

## 2019-10-31 LAB — URINALYSIS, COMPLETE (UACMP) WITH MICROSCOPIC
Bilirubin Urine: NEGATIVE
Glucose, UA: NEGATIVE mg/dL
Ketones, ur: NEGATIVE mg/dL
Nitrite: POSITIVE — AB
Protein, ur: 30 mg/dL — AB
Specific Gravity, Urine: 1.012 (ref 1.005–1.030)
pH: 5 (ref 5.0–8.0)

## 2019-10-31 LAB — CBC WITH DIFFERENTIAL/PLATELET
Abs Immature Granulocytes: 0 10*3/uL (ref 0.00–0.07)
Basophils Absolute: 0 10*3/uL (ref 0.0–0.1)
Basophils Relative: 1 %
Eosinophils Absolute: 0 10*3/uL (ref 0.0–0.5)
Eosinophils Relative: 2 %
HCT: 32.5 % — ABNORMAL LOW (ref 36.0–46.0)
Hemoglobin: 11.2 g/dL — ABNORMAL LOW (ref 12.0–15.0)
Immature Granulocytes: 0 %
Lymphocytes Relative: 13 %
Lymphs Abs: 0.2 10*3/uL — ABNORMAL LOW (ref 0.7–4.0)
MCH: 30.4 pg (ref 26.0–34.0)
MCHC: 34.5 g/dL (ref 30.0–36.0)
MCV: 88.1 fL (ref 80.0–100.0)
Monocytes Absolute: 0.2 10*3/uL (ref 0.1–1.0)
Monocytes Relative: 13 %
Neutro Abs: 1.3 10*3/uL — ABNORMAL LOW (ref 1.7–7.7)
Neutrophils Relative %: 71 %
Platelets: 125 10*3/uL — ABNORMAL LOW (ref 150–400)
RBC: 3.69 MIL/uL — ABNORMAL LOW (ref 3.87–5.11)
RDW: 12.4 % (ref 11.5–15.5)
WBC: 1.7 10*3/uL — ABNORMAL LOW (ref 4.0–10.5)
nRBC: 0 % (ref 0.0–0.2)

## 2019-10-31 LAB — MAGNESIUM: Magnesium: 1.9 mg/dL (ref 1.7–2.4)

## 2019-10-31 LAB — COMPREHENSIVE METABOLIC PANEL
ALT: 30 U/L (ref 0–44)
AST: 20 U/L (ref 15–41)
Albumin: 3.5 g/dL (ref 3.5–5.0)
Alkaline Phosphatase: 81 U/L (ref 38–126)
Anion gap: 8 (ref 5–15)
BUN: 13 mg/dL (ref 6–20)
CO2: 27 mmol/L (ref 22–32)
Calcium: 8.2 mg/dL — ABNORMAL LOW (ref 8.9–10.3)
Chloride: 103 mmol/L (ref 98–111)
Creatinine, Ser: 0.67 mg/dL (ref 0.44–1.00)
GFR calc Af Amer: >60 mL/min (ref >60)
GFR calc non Af Amer: >60 mL/min (ref >60)
Glucose, Bld: 134 mg/dL — ABNORMAL HIGH (ref 70–99)
Potassium: 3.4 mmol/L — ABNORMAL LOW (ref 3.5–5.1)
Sodium: 138 mmol/L (ref 135–145)
Total Bilirubin: 0.4 mg/dL (ref 0.3–1.2)
Total Protein: 6.8 g/dL (ref 6.5–8.1)

## 2019-10-31 MED ORDER — CIPROFLOXACIN HCL 500 MG PO TABS
500.0000 mg | ORAL_TABLET | Freq: Two times a day (BID) | ORAL | 0 refills | Status: DC
Start: 2019-10-31 — End: 2020-06-14

## 2019-10-31 MED ORDER — SODIUM CHLORIDE 0.9 % IV SOLN
20.0000 mg | Freq: Once | INTRAVENOUS | Status: AC
  Administered 2019-10-31: 20 mg via INTRAVENOUS
  Filled 2019-10-31: qty 20

## 2019-10-31 MED ORDER — SODIUM CHLORIDE 0.9 % IV SOLN
INTRAVENOUS | Status: DC
  Filled 2019-10-31 (×2): qty 1000

## 2019-10-31 MED ORDER — PROMETHAZINE HCL 25 MG/ML IJ SOLN
25.0000 mg | Freq: Four times a day (QID) | INTRAMUSCULAR | Status: DC | PRN
  Administered 2019-10-31: 25 mg via INTRAVENOUS
  Filled 2019-10-31: qty 1

## 2019-10-31 MED ORDER — PROMETHAZINE HCL 25 MG RE SUPP
25.0000 mg | Freq: Four times a day (QID) | RECTAL | 0 refills | Status: DC | PRN
Start: 2019-10-31 — End: 2019-11-07

## 2019-10-31 NOTE — Telephone Encounter (Signed)
Per Dr Tasia Catchings, she wanted to know if patient has tried Olanzapine and Zofran and Compazine and if she is keeping anything down, I spoke with daughter who states that she has tried all 3 medications and that she refuses to eat r drink due to nausea and is taking in less than 16 oz of fluid per day

## 2019-10-31 NOTE — Telephone Encounter (Signed)
Per Dr Tasia Catchings patient can come in for IV fluids and daughter said she will have her there in 30 minutes.

## 2019-10-31 NOTE — Telephone Encounter (Signed)
Dr Yu patient?

## 2019-10-31 NOTE — Progress Notes (Signed)
Patient has been nauseated for the past 5 days with vomiting episode on 2 days ago.  Also feeling weak/fatigued and wants to sleep all the time.

## 2019-10-31 NOTE — Telephone Encounter (Signed)
Patient daughter called reporting that patient is having nausea with vomiting once since Friday and that the medications she has are not controlling her nausea. Please advise.

## 2019-10-31 NOTE — Progress Notes (Signed)
Hematology/Oncology follow up note [facility] Telephone:([phone] Fax:(336) [phone]   Patient Care Team: Crissie Figures, PA-C as PCP - General (Physician Assistant) Clent Jacks, RN as Oncology Nurse Navigator  REFERRING PROVIDER: Crissie Figures, PA-C  CHIEF COMPLAINTS/REASON FOR VISIT:  Follow up for cervical cancer.   HISTORY OF PRESENTING ILLNESS:   Jill Rodriguez is a  58 y.o.  female with PMH listed below was seen in consultation at the request of  Crissie Figures, PA-C  for evaluation of cervical cancer.   Patient is G3, P3 Spanish-speaking postmenopausal female who initially presented with vaginal bleeding.  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  No intervention seen.  08/23/2019, pelvis ultrasound showed endometrium measures 8 mm.  Echogenic endometrial microcalcification seen throughout endometrium.  Hypoechoic area in the fundal aspect of the endometrium measures 0.8 x 1.1 x 2.3 cm Survey of the adnexa showed no adnexal masses.  Patient has history of prominent right neck pelvis and CT angio neck has been ordered. Patient was seen by Dr. Fransisca Connors, clinically the tumor is 3 cm and replaced whole cervix. 09/06/2019 PET scan showed hypermetabolic mass involving the cervix and the lower uterine segment consistent with known cervical cancer. Multiple small hypermetabolic retroperitoneal and pelvic lymph node bilaterally consistent with nodal metastasis. She also has small hypermetabolic lymph nodes in the left superior mediastinum and left supraclavicular regions are also suspicious for nodal metastasis.  Hepatic steatosis no cholelithiasis noted.  Today patient was accompanied by daughter to discuss management plan. Patient reports lower  abdominal pain.  Vaginal bleeding has stopped since the biopsy.  INTERVAL HISTORY Jill Rodriguez is a 59 y.o. female who has above history reviewed by me today presents for acute visit for nausea/vomiting, poor oral intake.   Problems and complaints are listed below: Patient is accompanied by her daughter. Patient prefers to use her daughter as interpreter. Patient has been taking Olanzapine, tried zofran and compazine.  She feels nasuea, very poor po intake. She vomited 2 days ago.  Also feels mild burning sensation with urination.  No fever or chills.   Review of Systems  Constitutional: Positive for fatigue. Negative for appetite change, chills and fever.  HENT:   Negative for hearing loss and voice change.   Eyes: Negative for eye problems.  Respiratory: Negative for chest tightness and cough.   Cardiovascular: Negative for chest pain.  Gastrointestinal: Positive for nausea and vomiting. Negative for abdominal distention, abdominal pain and blood in stool.  Endocrine: Negative for hot flashes.  Genitourinary: Negative for difficulty urinating and frequency.   Musculoskeletal: Negative for arthralgias.  Skin: Negative for itching and rash.  Neurological: Negative for extremity weakness.  Hematological: Negative for adenopathy.  Psychiatric/Behavioral: Negative for confusion.    MEDICAL HISTORY:  Past Medical History:  Diagnosis Date  . Cancer ([place])   . COVID-19 10/2018  . Nausea with vomiting 10/31/2019  . No pertinent past medical history     SURGICAL HISTORY: Past Surgical History:  Procedure Laterality Date  . no surgical history      SOCIAL HISTORY: Social History   Socioeconomic History  . Marital status: Married    Spouse name: Not on file  . Number of children: Not on file  . Years of education: Not on file  . Highest education level: Not  on file  Occupational History  . Not on file  Tobacco Use  . Smoking status: Never Smoker  . Smokeless  tobacco: Never Used  Vaping Use  . Vaping Use: Never used  Substance and Sexual Activity  . Alcohol use: Never  . Drug use: Never  . Sexual activity: Yes    Birth control/protection: None  Other Topics Concern  . Not on file  Social History Narrative  . Not on file   Social Determinants of Health   Financial Resource Strain:   . Difficulty of Paying Living Expenses:   Food Insecurity:   . Worried About Charity fundraiser in the Last Year:   . Arboriculturist in the Last Year:   Transportation Needs:   . Film/video editor (Medical):   [name] Kitchen Lack of Transportation (Non-Medical):   Physical Activity:   . Days of Exercise per Week:   . Minutes of Exercise per Session:   Stress:   . Feeling of Stress :   Social Connections:   . Frequency of Communication with Friends and Family:   . Frequency of Social Gatherings with Friends and Family:   . Attends Religious Services:   . Active Member of Clubs or Organizations:   . Attends Archivist Meetings:   [name] Kitchen Marital Status:   Intimate Partner Violence:   . Fear of Current or Ex-Partner:   . Emotionally Abused:   [name] Kitchen Physically Abused:   . Sexually Abused:     FAMILY HISTORY: Family History  Problem Relation Age of Onset  . Blindness Mother   . Glaucoma Mother   . Diabetes Father   . Diabetes Brother     ALLERGIES:  has No Known Allergies.  MEDICATIONS:  Current Outpatient Medications  Medication Sig Dispense Refill  . dexamethasone (DECADRON) 4 MG tablet Take 2 tablets (8 mg total) by mouth See admin instructions. Take 8mg  daily for 2 days after each chemotherapy-cisplatin treatments. 24 tablet 0  . nystatin (MYCOSTATIN) 100000 UNIT/ML suspension Use as directed 5 mLs (500,000 Units total) in the mouth or throat 4 (four) times daily. 120 mL 0  . OLANZapine (ZYPREXA) 10 MG tablet Take 1 tablet (10 mg total) by mouth at bedtime. 30 tablet 0  . omeprazole (PRILOSEC) 20 MG capsule Take 1 capsule (20 mg total) by  mouth daily. 30 capsule 1  . ondansetron (ZOFRAN) 8 MG tablet Take 1 tablet (8 mg total) by mouth every 8 (eight) hours as needed for nausea or vomiting. 30 tablet 1  . prochlorperazine (COMPAZINE) 10 MG tablet Take 1 tablet (10 mg total) by mouth every 6 (six) hours as needed (Nausea or vomiting). 30 tablet 1  . senna (SENOKOT) 8.6 MG TABS tablet Take 2 tablets (17.2 mg total) by mouth daily. 120 tablet 0  . promethazine (PHENERGAN) 25 MG suppository Place 1 suppository (25 mg total) rectally every 6 (six) hours as needed for nausea, vomiting or refractory nausea / vomiting. 12 each 0   No current facility-administered medications for this visit.   Facility-Administered Medications Ordered in Other Visits  Medication Dose Route Frequency Provider Last Rate Last Admin  . promethazine (PHENERGAN) injection 25 mg  25 mg Intravenous Q6H PRN Earlie Server, MD   25 mg at 10/31/19 1501  . sodium chloride 0.9 % 1,000 mL with potassium chloride 10 mEq infusion   Intravenous Continuous Earlie Server, MD   Stopped at 10/31/19 1616     PHYSICAL EXAMINATION: ECOG PERFORMANCE STATUS: 1 -  Symptomatic but completely ambulatory Vitals:   10/31/19 1423  BP: 122/80  Pulse: (!) 106  Resp: 16  Temp: 98.1 F (36.7 C)   Filed Weights   10/31/19 1423  Weight: 182 lb 12.8 oz (82.9 kg)    Physical Exam Constitutional:      General: She is not in acute distress. HENT:     Head: Normocephalic and atraumatic.     Mouth/Throat:     Comments: No thrush Eyes:     General: No scleral icterus. Cardiovascular:     Rate and Rhythm: Normal rate and regular rhythm.     Heart sounds: Normal heart sounds.  Pulmonary:     Effort: Pulmonary effort is normal. No respiratory distress.     Breath sounds: No wheezing.  Abdominal:     General: Bowel sounds are normal. There is no distension.     Palpations: Abdomen is soft.  Musculoskeletal:        General: No deformity. Normal range of motion.     Cervical back: Normal  range of motion and neck supple.  Skin:    General: Skin is warm and dry.     Findings: No erythema or rash.  Neurological:     Mental Status: She is alert and oriented to person, place, and time. Mental status is at baseline.     Cranial Nerves: No cranial nerve deficit.     Coordination: Coordination normal.  Psychiatric:        Mood and Affect: Mood normal.     LABORATORY DATA:  I have reviewed the data as listed Lab Results  Component Value Date   WBC 1.7 (L) 10/31/2019   HGB 11.2 (L) 10/31/2019   HCT 32.5 (L) 10/31/2019   MCV 88.1 10/31/2019   PLT 125 (L) 10/31/2019   Recent Labs    10/17/19 0830 10/24/19 0831 10/31/19 1413  NA 139 136 138  K 3.8 3.7 3.4*  CL 103 102 103  CO2 27 26 27   GLUCOSE 143* 155* 134*  BUN 12 15 13   CREATININE 0.76 0.69 0.67  CALCIUM 8.6* 8.5* 8.2*  GFRNONAA >60 >60 >60  GFRAA >60 >60 >60  PROT 6.8 6.9 6.8  ALBUMIN 3.6 3.6 3.5  AST 28 24 20   ALT 51* 30 30  ALKPHOS 73 64 81  BILITOT 0.7 0.6 0.4   Iron/TIBC/Ferritin/ %Sat No results found for: IRON, TIBC, FERRITIN, IRONPCTSAT    RADIOGRAPHIC STUDIES: I have personally reviewed the radiological images as listed and agreed with the findings in the report. No results found.    ASSESSMENT & PLAN:  1. Burning with urination   2. Intractable vomiting with nausea, unspecified vomiting type   3. Heartburn   4. Thrush     #Chemotherapy-induced nausea/vomiting Dehydration IV normal saline x 1 L,  Dexamethasone 20mg  x 1 Phenergan IV 25mg  x 1.  Advise patient to try phenergan supporsotoriy.  Continue Olanzapine.  # hypokalemia, IV KCL 46meq x 1 #Oral thrush,resolved,   Refill nystatin oral rinse 4 times daily swish and spit for prophylaxis use.  #Heartburn, likely GERD.  continue  omeprazole 20 mg daily. Symptoms have improved.  #Dysuria, check UA and urine culture.  UA showed positive for nitrite and leukocytes. Will start her empirically on cipro 500mg  BID for 5 days.    awaiting for urine culture.   # Neutropenia, due to chemo/Radiation. Monitor ANC 1.3  We spent sufficient time to discuss many aspect of care, questions were answered to patient's satisfaction.  All questions were answered. The patient knows to call the clinic with any problems questions or concerns.  Return of visit:  2 days.    Earlie Server, MD, PhD Hematology Oncology [facility] at [facility] Pager- [phone] 10/31/2019

## 2019-11-01 ENCOUNTER — Telehealth: Payer: Self-pay

## 2019-11-01 ENCOUNTER — Ambulatory Visit
Admission: RE | Admit: 2019-11-01 | Discharge: 2019-11-01 | Disposition: A | Discharge status: Home / Self Care | Payer: BC Managed Care – PPO | Source: Ambulatory Visit | Attending: Radiation Oncology | Admitting: Radiation Oncology

## 2019-11-01 DIAGNOSIS — C539 Malignant neoplasm of cervix uteri, unspecified: Secondary | ICD-10-CM | POA: Diagnosis not present

## 2019-11-01 NOTE — Telephone Encounter (Signed)
-----   Message from Earlie Server, MD sent at 10/31/2019  9:55 PM EDT ----- Please let pt know that her urine analysis showed possible UTI, recommend her to start taking antibiotics. Cipro 500mg  BID. Rx sent to pharmacy. Follow up as planned. Thanks.

## 2019-11-01 NOTE — Telephone Encounter (Signed)
Per Harmon message from Jarrett Ables: Patient is here today getting radiation, she asked about her urine test done yesterday evening. I looked and saw she had an infection and Cipro was called in. I let the patient know to pick it up at [facility] in [place].

## 2019-11-02 ENCOUNTER — Inpatient Hospital Stay: Payer: BC Managed Care – PPO

## 2019-11-02 ENCOUNTER — Inpatient Hospital Stay (HOSPITAL_BASED_OUTPATIENT_CLINIC_OR_DEPARTMENT_OTHER): Payer: BC Managed Care – PPO | Admitting: Oncology

## 2019-11-02 ENCOUNTER — Encounter: Payer: Self-pay | Admitting: Oncology

## 2019-11-02 ENCOUNTER — Ambulatory Visit
Admission: RE | Admit: 2019-11-02 | Discharge: 2019-11-02 | Disposition: A | Discharge status: Home / Self Care | Payer: BC Managed Care – PPO | Source: Ambulatory Visit | Attending: Radiation Oncology | Admitting: Radiation Oncology

## 2019-11-02 ENCOUNTER — Other Ambulatory Visit: Payer: Self-pay

## 2019-11-02 ENCOUNTER — Other Ambulatory Visit: Payer: Self-pay | Admitting: *Deleted

## 2019-11-02 VITALS — BP 126/80 | HR 91 | Temp 96.6°F | Resp 18 | Wt 186.5 lb

## 2019-11-02 DIAGNOSIS — R300 Dysuria: Secondary | ICD-10-CM | POA: Diagnosis not present

## 2019-11-02 DIAGNOSIS — C530 Malignant neoplasm of endocervix: Secondary | ICD-10-CM

## 2019-11-02 DIAGNOSIS — R112 Nausea with vomiting, unspecified: Secondary | ICD-10-CM

## 2019-11-02 DIAGNOSIS — C539 Malignant neoplasm of cervix uteri, unspecified: Secondary | ICD-10-CM | POA: Diagnosis not present

## 2019-11-02 DIAGNOSIS — Z5111 Encounter for antineoplastic chemotherapy: Secondary | ICD-10-CM

## 2019-11-02 DIAGNOSIS — Z7189 Other specified counseling: Secondary | ICD-10-CM

## 2019-11-02 DIAGNOSIS — C538 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 uteri: Secondary | ICD-10-CM

## 2019-11-02 LAB — COMPREHENSIVE METABOLIC PANEL
ALT: 29 U/L (ref 0–44)
AST: 21 U/L (ref 15–41)
Albumin: 3.6 g/dL (ref 3.5–5.0)
Alkaline Phosphatase: 76 U/L (ref 38–126)
Anion gap: 9 (ref 5–15)
BUN: 19 mg/dL (ref 6–20)
CO2: 24 mmol/L (ref 22–32)
Calcium: 8.3 mg/dL — ABNORMAL LOW (ref 8.9–10.3)
Chloride: 106 mmol/L (ref 98–111)
Creatinine, Ser: 0.58 mg/dL (ref 0.44–1.00)
GFR calc Af Amer: >60 mL/min (ref >60)
GFR calc non Af Amer: >60 mL/min (ref >60)
Glucose, Bld: 139 mg/dL — ABNORMAL HIGH (ref 70–99)
Potassium: 3.4 mmol/L — ABNORMAL LOW (ref 3.5–5.1)
Sodium: 139 mmol/L (ref 135–145)
Total Bilirubin: 0.4 mg/dL (ref 0.3–1.2)
Total Protein: 6.6 g/dL (ref 6.5–8.1)

## 2019-11-02 LAB — CBC WITH DIFFERENTIAL/PLATELET
Abs Immature Granulocytes: 0.01 10*3/uL (ref 0.00–0.07)
Basophils Absolute: 0 10*3/uL (ref 0.0–0.1)
Basophils Relative: 0 %
Eosinophils Absolute: 0 10*3/uL (ref 0.0–0.5)
Eosinophils Relative: 0 %
HCT: 30 % — ABNORMAL LOW (ref 36.0–46.0)
Hemoglobin: 10.3 g/dL — ABNORMAL LOW (ref 12.0–15.0)
Immature Granulocytes: 0 %
Lymphocytes Relative: 15 %
Lymphs Abs: 0.4 10*3/uL — ABNORMAL LOW (ref 0.7–4.0)
MCH: 30.7 pg (ref 26.0–34.0)
MCHC: 34.3 g/dL (ref 30.0–36.0)
MCV: 89.3 fL (ref 80.0–100.0)
Monocytes Absolute: 0.2 10*3/uL (ref 0.1–1.0)
Monocytes Relative: 7 %
Neutro Abs: 1.8 10*3/uL (ref 1.7–7.7)
Neutrophils Relative %: 78 %
Platelets: 111 10*3/uL — ABNORMAL LOW (ref 150–400)
RBC: 3.36 MIL/uL — ABNORMAL LOW (ref 3.87–5.11)
RDW: 12.7 % (ref 11.5–15.5)
WBC: 2.4 10*3/uL — ABNORMAL LOW (ref 4.0–10.5)
nRBC: 0 % (ref 0.0–0.2)

## 2019-11-02 MED ORDER — SODIUM CHLORIDE 0.9 % IV SOLN
40.0000 mg/m2 | Freq: Once | INTRAVENOUS | Status: AC
  Administered 2019-11-02: 77 mg via INTRAVENOUS
  Filled 2019-11-02: qty 77

## 2019-11-02 MED ORDER — SODIUM CHLORIDE 0.9 % IV SOLN
10.0000 mg | Freq: Once | INTRAVENOUS | Status: AC
  Administered 2019-11-02: 10 mg via INTRAVENOUS
  Filled 2019-11-02: qty 10

## 2019-11-02 MED ORDER — PALONOSETRON HCL INJECTION 0.25 MG/5ML
0.2500 mg | Freq: Once | INTRAVENOUS | Status: AC
  Administered 2019-11-02: 0.25 mg via INTRAVENOUS
  Filled 2019-11-02: qty 5

## 2019-11-02 MED ORDER — POTASSIUM CHLORIDE 2 MEQ/ML IV SOLN
Freq: Once | INTRAVENOUS | Status: AC
  Filled 2019-11-02: qty 1000

## 2019-11-02 MED ORDER — SODIUM CHLORIDE 0.9 % IV SOLN
150.0000 mg | Freq: Once | INTRAVENOUS | Status: AC
  Administered 2019-11-02: 150 mg via INTRAVENOUS
  Filled 2019-11-02: qty 150

## 2019-11-02 MED ORDER — SODIUM CHLORIDE 0.9 % IV SOLN
Freq: Once | INTRAVENOUS | Status: AC
  Filled 2019-11-02: qty 250

## 2019-11-02 NOTE — Progress Notes (Signed)
Hematology/Oncology follow up note [facility] Telephone:(336) [phone] Fax:(336) [phone]   Patient Care Team: Crissie Figures, PA-C as PCP - General (Physician Assistant) Clent Jacks, RN as Oncology Nurse Navigator  REFERRING PROVIDER: Crissie Figures, PA-C  CHIEF COMPLAINTS/REASON FOR VISIT:  Follow up for cervical cancer.   HISTORY OF PRESENTING ILLNESS:   Jill Rodriguez is a  59 y.o.  female with PMH listed below was seen in consultation at the request of  Crissie Figures, PA-C  for evaluation of cervical cancer.   Patient is G3, P3 Spanish-speaking postmenopausal female who initially presented with vaginal bleeding.  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  No intervention seen.  08/23/2019, pelvis ultrasound showed endometrium measures 8 mm.  Echogenic endometrial microcalcification seen throughout endometrium.  Hypoechoic area in the fundal aspect of the endometrium measures 0.8 x 1.1 x 2.3 cm Survey of the adnexa showed no adnexal masses.  Patient has history of prominent right neck pelvis and CT angio neck has been ordered. Patient was seen by Dr. Fransisca Connors, clinically the tumor is 3 cm and replaced whole cervix. 09/06/2019 PET scan showed hypermetabolic mass involving the cervix and the lower uterine segment consistent with known cervical cancer. Multiple small hypermetabolic retroperitoneal and pelvic lymph node bilaterally consistent with nodal metastasis. She also has small hypermetabolic lymph nodes in the left superior mediastinum and left supraclavicular regions are also suspicious for nodal metastasis.  Hepatic steatosis no cholelithiasis noted.  Today patient was accompanied by daughter to discuss management plan. Patient reports lower  abdominal pain.  Vaginal bleeding has stopped since the biopsy.  INTERVAL HISTORY Jill Rodriguez is a 59 y.o. female who has above history reviewed by me today presents for evaluation prior to chemotherapy for treatments of at least stage IIIc cervical cancer.  Problems and complaints are listed below: Patient is accompanied by her daughter. Patient prefers to have her daughter as interpreter. Patient was seen by me a few days ago for acute visit for intractable nausea vomiting and poor oral intake.  Patient was given supportive care. Nausea is better after switch to suppository Phenergan.  She also takes olanzapine at night. Oral intake has also improved. Dysuria symptoms has improved after taking empiric antibiotics.  Urine culture still pending.    Review of Systems  Constitutional: Negative for appetite change, chills, fatigue and fever.  HENT:   Negative for hearing loss and voice change.   Eyes: Negative for eye problems.  Respiratory: Negative for chest tightness and cough.   Cardiovascular: Negative for chest pain.  Gastrointestinal: Negative for abdominal distention, abdominal pain, blood in stool, nausea and vomiting.  Endocrine: Negative for hot flashes.  Genitourinary: Negative for difficulty urinating and frequency.   Musculoskeletal: Negative for arthralgias.  Skin: Negative for itching and rash.  Neurological: Negative for extremity weakness.  Hematological: Negative for adenopathy.  Psychiatric/Behavioral: Negative for confusion.    MEDICAL HISTORY:  Past Medical History:  Diagnosis Date  . Cancer ([place])   . COVID-19 10/2018  . Nausea with vomiting 10/31/2019  . No pertinent past medical history     SURGICAL HISTORY: Past Surgical History:  Procedure Laterality Date  . no surgical history      SOCIAL HISTORY: Social History   Socioeconomic History  . Marital status: Married    Spouse name:  Not on file  . Number of children: Not on file  . Years  of education: Not on file  . Highest education level: Not on file  Occupational History  . Not on file  Tobacco Use  . Smoking status: Never Smoker  . Smokeless tobacco: Never Used  Vaping Use  . Vaping Use: Never used  Substance and Sexual Activity  . Alcohol use: Never  . Drug use: Never  . Sexual activity: Yes    Birth control/protection: None  Other Topics Concern  . Not on file  Social History Narrative  . Not on file   Social Determinants of Health   Financial Resource Strain:   . Difficulty of Paying Living Expenses:   Food Insecurity:   . Worried About Charity fundraiser in the Last Year:   . Arboriculturist in the Last Year:   Transportation Needs:   . Film/video editor (Medical):   [name] Kitchen Lack of Transportation (Non-Medical):   Physical Activity:   . Days of Exercise per Week:   . Minutes of Exercise per Session:   Stress:   . Feeling of Stress :   Social Connections:   . Frequency of Communication with Friends and Family:   . Frequency of Social Gatherings with Friends and Family:   . Attends Religious Services:   . Active Member of Clubs or Organizations:   . Attends Archivist Meetings:   [name] Kitchen Marital Status:   Intimate Partner Violence:   . Fear of Current or Ex-Partner:   . Emotionally Abused:   [name] Kitchen Physically Abused:   . Sexually Abused:     FAMILY HISTORY: Family History  Problem Relation Age of Onset  . Blindness Mother   . Glaucoma Mother   . Diabetes Father   . Diabetes Brother     ALLERGIES:  has No Known Allergies.  MEDICATIONS:  Current Outpatient Medications  Medication Sig Dispense Refill  . ciprofloxacin (CIPRO) 500 MG tablet Take 1 tablet (500 mg total) by mouth 2 (two) times daily. 10 tablet 0  . dexamethasone (DECADRON) 4 MG tablet Take 2 tablets (8 mg total) by mouth See admin instructions. Take 8mg  daily for 2 days after each chemotherapy-cisplatin treatments. 24 tablet 0  . nystatin (MYCOSTATIN) 100000 UNIT/ML  suspension Use as directed 5 mLs (500,000 Units total) in the mouth or throat 4 (four) times daily. 120 mL 0  . OLANZapine (ZYPREXA) 10 MG tablet Take 1 tablet (10 mg total) by mouth at bedtime. 30 tablet 0  . omeprazole (PRILOSEC) 20 MG capsule Take 1 capsule (20 mg total) by mouth daily. 30 capsule 1  . ondansetron (ZOFRAN) 8 MG tablet Take 1 tablet (8 mg total) by mouth every 8 (eight) hours as needed for nausea or vomiting. 30 tablet 1  . prochlorperazine (COMPAZINE) 10 MG tablet Take 1 tablet (10 mg total) by mouth every 6 (six) hours as needed (Nausea or vomiting). 30 tablet 1  . promethazine (PHENERGAN) 25 MG suppository Place 1 suppository (25 mg total) rectally every 6 (six) hours as needed for nausea, vomiting or refractory nausea / vomiting. 12 each 0  . senna (SENOKOT) 8.6 MG TABS tablet Take 2 tablets (17.2 mg total) by mouth daily. 120 tablet 0   No current facility-administered medications for this visit.     PHYSICAL EXAMINATION: ECOG PERFORMANCE STATUS: 1 - Symptomatic but completely ambulatory Vitals:   11/02/19 0833  BP: 126/80  Pulse: 91  Resp: 18  Temp: (!)  96.6 F (35.9 C)   Filed Weights   11/02/19 0833  Weight: 186 lb 8 oz (84.6 kg)    Physical Exam Constitutional:      General: She is not in acute distress. HENT:     Head: Normocephalic and atraumatic.     Mouth/Throat:     Comments: No thrush Eyes:     General: No scleral icterus. Cardiovascular:     Rate and Rhythm: Normal rate and regular rhythm.     Heart sounds: Normal heart sounds.  Pulmonary:     Effort: Pulmonary effort is normal. No respiratory distress.     Breath sounds: No wheezing.  Abdominal:     General: Bowel sounds are normal. There is no distension.     Palpations: Abdomen is soft.  Musculoskeletal:        General: No deformity. Normal range of motion.     Cervical back: Normal range of motion and neck supple.  Skin:    General: Skin is warm and dry.     Findings: No  erythema or rash.  Neurological:     Mental Status: She is alert and oriented to person, place, and time. Mental status is at baseline.     Cranial Nerves: No cranial nerve deficit.     Coordination: Coordination normal.  Psychiatric:        Mood and Affect: Mood normal.     LABORATORY DATA:  I have reviewed the data as listed Lab Results  Component Value Date   WBC 2.4 (L) 11/02/2019   HGB 10.3 (L) 11/02/2019   HCT 30.0 (L) 11/02/2019   MCV 89.3 11/02/2019   PLT 111 (L) 11/02/2019   Recent Labs    10/17/19 0830 10/24/19 0831 10/31/19 1413  NA 139 136 138  K 3.8 3.7 3.4*  CL 103 102 103  CO2 27 26 27   GLUCOSE 143* 155* 134*  BUN 12 15 13   CREATININE 0.76 0.69 0.67  CALCIUM 8.6* 8.5* 8.2*  GFRNONAA >60 >60 >60  GFRAA >60 >60 >60  PROT 6.8 6.9 6.8  ALBUMIN 3.6 3.6 3.5  AST 28 24 20   ALT 51* 30 30  ALKPHOS 73 64 81  BILITOT 0.7 0.6 0.4   Iron/TIBC/Ferritin/ %Sat No results found for: IRON, TIBC, FERRITIN, IRONPCTSAT    RADIOGRAPHIC STUDIES: I have personally reviewed the radiological images as listed and agreed with the findings in the report. No results found.    ASSESSMENT & PLAN:  1. Malignant neoplasm of endocervix ([place])   2. Encounter for antineoplastic chemotherapy   3. Non-intractable vomiting with nausea, unspecified vomiting type   4. Burning with urination    # At least Stage IIIc Cervix cancer.  Labs are reviewed and discussed with patient. Clinically she is doing better. Counts acceptable to proceed with cycle 5 cisplatin.  She is finishing radiation on 11/07/2019.  This will be her last chemotherapy today.  Follow-up to be determined.  Patient distal to [facility] for evaluation for brachytherapy.  #Chemotherapy-induced nausea/vomiting, symptom has improved.  No vomiting. Continue olanzapine at bedtime.  Continue suppository Phenergan 25 mg every 6 hours.  # Neutropenia, due to chemo/Radiation.  Improved. ANC 1.8 #Dysuria, may be secondary to  pelvic radiation.  Was positive.  Urine culture pending. Continue empiric Cipro and finished a course.  We spent sufficient time to discuss many aspect of care, questions were answered to patient's satisfaction.   All questions were answered. The patient knows to call the clinic with any  problems questions or concerns.  Return of visit: To be determined.  Follow-up after she finishes treatment.  This is to be arranged by nurse navigator Angelina Ok, MD, PhD Hematology [place] at [facility] Pager- [phone] 11/02/2019

## 2019-11-02 NOTE — Progress Notes (Signed)
Patient here for follow up. Pt reports feeling better after IV and phenergan on 7/6. Has not had any more vomiting episodes since then. Patient and daughter have questions regarding surgery after txs.

## 2019-11-03 ENCOUNTER — Ambulatory Visit
Admission: RE | Admit: 2019-11-03 | Discharge: 2019-11-03 | Disposition: A | Discharge status: Home / Self Care | Payer: BC Managed Care – PPO | Source: Ambulatory Visit | Attending: Radiation Oncology | Admitting: Radiation Oncology

## 2019-11-03 DIAGNOSIS — C539 Malignant neoplasm of cervix uteri, unspecified: Secondary | ICD-10-CM | POA: Diagnosis not present

## 2019-11-03 LAB — URINE CULTURE: Culture: >=100000 — AB

## 2019-11-06 ENCOUNTER — Ambulatory Visit
Admission: RE | Admit: 2019-11-06 | Discharge: 2019-11-06 | Disposition: A | Discharge status: Home / Self Care | Payer: BC Managed Care – PPO | Source: Ambulatory Visit | Attending: Radiation Oncology | Admitting: Radiation Oncology

## 2019-11-06 DIAGNOSIS — C539 Malignant neoplasm of cervix uteri, unspecified: Secondary | ICD-10-CM | POA: Diagnosis not present

## 2019-11-07 ENCOUNTER — Inpatient Hospital Stay (HOSPITAL_BASED_OUTPATIENT_CLINIC_OR_DEPARTMENT_OTHER): Payer: BC Managed Care – PPO | Admitting: Oncology

## 2019-11-07 ENCOUNTER — Encounter: Payer: Self-pay | Admitting: Oncology

## 2019-11-07 ENCOUNTER — Other Ambulatory Visit: Payer: Self-pay

## 2019-11-07 ENCOUNTER — Ambulatory Visit
Admission: RE | Admit: 2019-11-07 | Discharge: 2019-11-07 | Disposition: A | Discharge status: Home / Self Care | Payer: BC Managed Care – PPO | Source: Ambulatory Visit | Attending: Radiation Oncology | Admitting: Radiation Oncology

## 2019-11-07 ENCOUNTER — Inpatient Hospital Stay: Payer: BC Managed Care – PPO

## 2019-11-07 VITALS — BP 110/72 | HR 108 | Temp 97.2°F | Resp 16 | Wt 183.7 lb

## 2019-11-07 DIAGNOSIS — R112 Nausea with vomiting, unspecified: Secondary | ICD-10-CM

## 2019-11-07 DIAGNOSIS — Z5111 Encounter for antineoplastic chemotherapy: Secondary | ICD-10-CM | POA: Diagnosis not present

## 2019-11-07 DIAGNOSIS — N3000 Acute cystitis without hematuria: Secondary | ICD-10-CM

## 2019-11-07 DIAGNOSIS — C530 Malignant neoplasm of endocervix: Secondary | ICD-10-CM | POA: Diagnosis not present

## 2019-11-07 DIAGNOSIS — B370 Candidal stomatitis: Secondary | ICD-10-CM

## 2019-11-07 DIAGNOSIS — C538 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 uteri: Secondary | ICD-10-CM

## 2019-11-07 DIAGNOSIS — C539 Malignant neoplasm of cervix uteri, unspecified: Secondary | ICD-10-CM | POA: Diagnosis not present

## 2019-11-07 DIAGNOSIS — Z7189 Other specified counseling: Secondary | ICD-10-CM

## 2019-11-07 LAB — CBC WITH DIFFERENTIAL/PLATELET
Abs Immature Granulocytes: 0.01 10*3/uL (ref 0.00–0.07)
Basophils Absolute: 0 10*3/uL (ref 0.0–0.1)
Basophils Relative: 1 %
Eosinophils Absolute: 0 10*3/uL (ref 0.0–0.5)
Eosinophils Relative: 1 %
HCT: 32.1 % — ABNORMAL LOW (ref 36.0–46.0)
Hemoglobin: 11.1 g/dL — ABNORMAL LOW (ref 12.0–15.0)
Immature Granulocytes: 1 %
Lymphocytes Relative: 10 %
Lymphs Abs: 0.2 10*3/uL — ABNORMAL LOW (ref 0.7–4.0)
MCH: 30.8 pg (ref 26.0–34.0)
MCHC: 34.6 g/dL (ref 30.0–36.0)
MCV: 89.2 fL (ref 80.0–100.0)
Monocytes Absolute: 0.2 10*3/uL (ref 0.1–1.0)
Monocytes Relative: 13 %
Neutro Abs: 1.3 10*3/uL — ABNORMAL LOW (ref 1.7–7.7)
Neutrophils Relative %: 74 %
Platelets: 111 10*3/uL — ABNORMAL LOW (ref 150–400)
RBC: 3.6 MIL/uL — ABNORMAL LOW (ref 3.87–5.11)
RDW: 13.3 % (ref 11.5–15.5)
WBC: 1.7 10*3/uL — ABNORMAL LOW (ref 4.0–10.5)
nRBC: 0 % (ref 0.0–0.2)

## 2019-11-07 LAB — COMPREHENSIVE METABOLIC PANEL
ALT: 71 U/L — ABNORMAL HIGH (ref 0–44)
AST: 33 U/L (ref 15–41)
Albumin: 3.6 g/dL (ref 3.5–5.0)
Alkaline Phosphatase: 76 U/L (ref 38–126)
Anion gap: 7 (ref 5–15)
BUN: 24 mg/dL — ABNORMAL HIGH (ref 6–20)
CO2: 28 mmol/L (ref 22–32)
Calcium: 8.5 mg/dL — ABNORMAL LOW (ref 8.9–10.3)
Chloride: 103 mmol/L (ref 98–111)
Creatinine, Ser: 0.8 mg/dL (ref 0.44–1.00)
GFR calc Af Amer: >60 mL/min (ref >60)
GFR calc non Af Amer: >60 mL/min (ref >60)
Glucose, Bld: 155 mg/dL — ABNORMAL HIGH (ref 70–99)
Potassium: 3.9 mmol/L (ref 3.5–5.1)
Sodium: 138 mmol/L (ref 135–145)
Total Bilirubin: 0.5 mg/dL (ref 0.3–1.2)
Total Protein: 6.7 g/dL (ref 6.5–8.1)

## 2019-11-07 MED ORDER — DEXAMETHASONE SODIUM PHOSPHATE 10 MG/ML IJ SOLN
10.0000 mg | Freq: Once | INTRAMUSCULAR | Status: AC
  Administered 2019-11-07: 10 mg via INTRAVENOUS
  Filled 2019-11-07: qty 1

## 2019-11-07 MED ORDER — PROMETHAZINE HCL 25 MG/ML IJ SOLN
25.0000 mg | Freq: Four times a day (QID) | INTRAMUSCULAR | Status: DC | PRN
  Administered 2019-11-07: 25 mg via INTRAVENOUS
  Filled 2019-11-07: qty 1

## 2019-11-07 MED ORDER — SODIUM CHLORIDE 0.9 % IV SOLN
Freq: Once | INTRAVENOUS | Status: AC
  Filled 2019-11-07: qty 250

## 2019-11-07 MED ORDER — PROMETHAZINE HCL 25 MG RE SUPP: 25.0000 mg | Freq: Four times a day (QID) | RECTAL | 1 refills | Status: DC | PRN

## 2019-11-07 MED ORDER — SODIUM CHLORIDE 0.9 % IV SOLN: 10.0000 mg | Freq: Once | INTRAVENOUS | Status: DC

## 2019-11-07 NOTE — Progress Notes (Signed)
Hematology/Oncology follow up note [facility] Telephone:(336) [phone] Fax:(336) [phone]   Patient Care Team: Crissie Figures, PA-C as PCP - General (Physician Assistant) Clent Jacks, RN as Oncology Nurse Navigator  REFERRING PROVIDER: Crissie Figures, PA-C  CHIEF COMPLAINTS/REASON FOR VISIT:  Follow up for cervical cancer, treatment related symptoms.  HISTORY OF PRESENTING ILLNESS:   Jill Rodriguez is a  59 y.o.  female with PMH listed below was seen in consultation at the request of  Crissie Figures, PA-C  for evaluation of cervical cancer.   Patient is G3, P3 Spanish-speaking postmenopausal female who initially presented with vaginal bleeding.  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  No intervention seen.  08/23/2019, pelvis ultrasound showed endometrium measures 8 mm.  Echogenic endometrial microcalcification seen throughout endometrium.  Hypoechoic area in the fundal aspect of the endometrium measures 0.8 x 1.1 x 2.3 cm Survey of the adnexa showed no adnexal masses.  Patient has history of prominent right neck pelvis and CT angio neck has been ordered. Patient was seen by Dr. Fransisca Connors, clinically the tumor is 3 cm and replaced whole cervix. 09/06/2019 PET scan showed hypermetabolic mass involving the cervix and the lower uterine segment consistent with known cervical cancer. Multiple small hypermetabolic retroperitoneal and pelvic lymph node bilaterally consistent with nodal metastasis. She also has small hypermetabolic lymph nodes in the left superior mediastinum and left supraclavicular regions are also suspicious for nodal metastasis.  Hepatic steatosis no cholelithiasis noted.  Today patient was accompanied by daughter to discuss management  plan. Patient reports lower abdominal pain.  Vaginal bleeding has stopped since the biopsy.  INTERVAL HISTORY Jill Rodriguez is a 59 y.o. female who has above history reviewed by me today presents for evaluation prior to chemotherapy for treatments of at least stage IIIc cervical cancer.  Problems and complaints are listed below: Patient is accompanied by her daughter. Patient finished radiation today. She reports feeling weak and fatigued.  Continues to have nausea, Phenergan helps her symptoms.  Very poor appetite.  She requests to be seen for additional supportive care. Denies any dysuria symptoms.  No fever.   Review of Systems  Constitutional: Positive for appetite change and fatigue. Negative for chills and fever.  HENT:   Negative for hearing loss and voice change.   Eyes: Negative for eye problems.  Respiratory: Negative for chest tightness and cough.   Cardiovascular: Negative for chest pain.  Gastrointestinal: Positive for nausea. Negative for abdominal distention, abdominal pain, blood in stool and vomiting.  Endocrine: Negative for hot flashes.  Genitourinary: Negative for difficulty urinating and frequency.   Musculoskeletal: Negative for arthralgias.  Skin: Negative for itching and rash.  Neurological: Negative for extremity weakness.  Hematological: Negative for adenopathy.  Psychiatric/Behavioral: Negative for confusion.    MEDICAL HISTORY:  Past Medical History:  Diagnosis Date  . Cancer ([place])   . COVID-19 10/2018  . Nausea with vomiting 10/31/2019  . No pertinent past medical history     SURGICAL HISTORY: Past Surgical History:  Procedure Laterality Date  . no surgical history      SOCIAL HISTORY: Social History   Socioeconomic History  . Marital status: Married    Spouse name: Not on file  . Number of children: Not on file  . Years of education: Not on file  .  Highest education level: Not on file  Occupational History  . Not on file   Tobacco Use  . Smoking status: Never Smoker  . Smokeless tobacco: Never Used  Vaping Use  . Vaping Use: Never used  Substance and Sexual Activity  . Alcohol use: Never  . Drug use: Never  . Sexual activity: Yes    Birth control/protection: None  Other Topics Concern  . Not on file  Social History Narrative  . Not on file   Social Determinants of Health   Financial Resource Strain:   . Difficulty of Paying Living Expenses:   Food Insecurity:   . Worried About Charity fundraiser in the Last Year:   . Arboriculturist in the Last Year:   Transportation Needs:   . Film/video editor (Medical):   [name] Kitchen Lack of Transportation (Non-Medical):   Physical Activity:   . Days of Exercise per Week:   . Minutes of Exercise per Session:   Stress:   . Feeling of Stress :   Social Connections:   . Frequency of Communication with Friends and Family:   . Frequency of Social Gatherings with Friends and Family:   . Attends Religious Services:   . Active Member of Clubs or Organizations:   . Attends Archivist Meetings:   [name] Kitchen Marital Status:   Intimate Partner Violence:   . Fear of Current or Ex-Partner:   . Emotionally Abused:   [name] Kitchen Physically Abused:   . Sexually Abused:     FAMILY HISTORY: Family History  Problem Relation Age of Onset  . Blindness Mother   . Glaucoma Mother   . Diabetes Father   . Diabetes Brother     ALLERGIES:  has No Known Allergies.  MEDICATIONS:  Current Outpatient Medications  Medication Sig Dispense Refill  . OLANZapine (ZYPREXA) 10 MG tablet Take 1 tablet (10 mg total) by mouth at bedtime. 30 tablet 0  . omeprazole (PRILOSEC) 20 MG capsule Take 1 capsule (20 mg total) by mouth daily. 30 capsule 1  . ondansetron (ZOFRAN) 8 MG tablet Take 1 tablet (8 mg total) by mouth every 8 (eight) hours as needed for nausea or vomiting. 30 tablet 1  . prochlorperazine (COMPAZINE) 10 MG tablet Take 1 tablet (10 mg total) by mouth every 6 (six) hours as  needed (Nausea or vomiting). 30 tablet 1  . promethazine (PHENERGAN) 25 MG suppository Place 1 suppository (25 mg total) rectally every 6 (six) hours as needed for nausea, vomiting or refractory nausea / vomiting. 12 each 1  . senna (SENOKOT) 8.6 MG TABS tablet Take 2 tablets (17.2 mg total) by mouth daily. 120 tablet 0  . ciprofloxacin (CIPRO) 500 MG tablet Take 1 tablet (500 mg total) by mouth 2 (two) times daily. (Patient not taking: Reported on 11/07/2019) 10 tablet 0  . dexamethasone (DECADRON) 4 MG tablet Take 2 tablets (8 mg total) by mouth See admin instructions. Take 8mg  daily for 2 days after each chemotherapy-cisplatin treatments. (Patient not taking: Reported on 11/07/2019) 24 tablet 0  . nystatin (MYCOSTATIN) 100000 UNIT/ML suspension Use as directed 5 mLs (500,000 Units total) in the mouth or throat 4 (four) times daily. (Patient not taking: Reported on 11/07/2019) 120 mL 0   No current facility-administered medications for this visit.   Facility-Administered Medications Ordered in Other Visits  Medication Dose Route Frequency Provider Last Rate Last Admin  . promethazine (PHENERGAN) injection 25 mg  25 mg Intravenous Q6H PRN Earlie Server, MD  25 mg at 11/07/19 1212     PHYSICAL EXAMINATION: ECOG PERFORMANCE STATUS: 1 - Symptomatic but completely ambulatory Vitals:   11/07/19 1113  BP: 110/72  Pulse: (!) 108  Resp: 16  Temp: (!) 97.2 F (36.2 C)   Filed Weights   11/07/19 1113  Weight: 183 lb 11.2 oz (83.3 kg)    Physical Exam Constitutional:      General: She is not in acute distress. HENT:     Head: Normocephalic and atraumatic.     Mouth/Throat:     Comments: thrush Eyes:     General: No scleral icterus. Cardiovascular:     Rate and Rhythm: Normal rate and regular rhythm.     Heart sounds: Normal heart sounds.  Pulmonary:     Effort: Pulmonary effort is normal. No respiratory distress.     Breath sounds: No wheezing.  Abdominal:     General: Bowel sounds are  normal. There is no distension.     Palpations: Abdomen is soft.  Musculoskeletal:        General: No deformity. Normal range of motion.     Cervical back: Normal range of motion and neck supple.  Skin:    General: Skin is warm and dry.     Findings: No erythema or rash.  Neurological:     Mental Status: She is alert and oriented to person, place, and time. Mental status is at baseline.     Cranial Nerves: No cranial nerve deficit.     Coordination: Coordination normal.  Psychiatric:        Mood and Affect: Mood normal.     LABORATORY DATA:  I have reviewed the data as listed Lab Results  Component Value Date   WBC 1.7 (L) 11/07/2019   HGB 11.1 (L) 11/07/2019   HCT 32.1 (L) 11/07/2019   MCV 89.2 11/07/2019   PLT 111 (L) 11/07/2019   Recent Labs    10/31/19 1413 11/02/19 0803 11/07/19 1054  NA 138 139 138  K 3.4* 3.4* 3.9  CL 103 106 103  CO2 27 24 28   GLUCOSE 134* 139* 155*  BUN 13 19 24*  CREATININE 0.67 0.58 0.80  CALCIUM 8.2* 8.3* 8.5*  GFRNONAA >60 >60 >60  GFRAA >60 >60 >60  PROT 6.8 6.6 6.7  ALBUMIN 3.5 3.6 3.6  AST 20 21 33  ALT 30 29 71*  ALKPHOS 81 76 76  BILITOT 0.4 0.4 0.5   Iron/TIBC/Ferritin/ %Sat No results found for: IRON, TIBC, FERRITIN, IRONPCTSAT    RADIOGRAPHIC STUDIES: I have personally reviewed the radiological images as listed and agreed with the findings in the report. No results found.    ASSESSMENT & PLAN:  1. Malignant neoplasm of endocervix ([place])   2. Non-intractable vomiting with nausea, unspecified vomiting type   3. Thrush   4. Acute cystitis without hematuria    # At least Stage IIIc Cervix cancer.  Labs reviewed and discussed with patient. She has finished concurrent chemotherapy and radiation. She tolerated with moderate difficulties.  See below. GYN oncology has scheduled patient to proceed with PET scan tomorrow before deciding next steps, [facility] for brachytherapy versus additional  chemotherapy.  #Chemotherapy-induced nausea.  No vomiting. Continue olanzapine.  Continue Phenergan as instructed. IV Phenergan 25 mg x 1 today.  IV dexamethasone 10 mg x 1.  #Increase of BUN, creatinine is stable slightly higher than her baseline.  Likely due to dehydration. Proceed with IV normal saline 1 L.  # Neutropenia, due to chemo/Radiation.  Neutrophil 1.3.  Monitor  #UTI, patient's urine culture grew E. coli, pansensitive.  Patient has finished a course of Cipro.  Symptom has improved.  We spent sufficient time to discuss many aspect of care, questions were answered to patient's satisfaction.   All questions were answered. The patient knows to call the clinic with any problems questions or concerns.  Return of visit: To be determined.  Follow-up after she finishes treatment.  This is to be arranged by nurse navigator Angelina Ok, MD, PhD Hematology [place] at [facility] Pager- [phone] 11/07/2019

## 2019-11-07 NOTE — Progress Notes (Signed)
Patient reports she does not have an appetite which is causing her to feel weak.  She is requesting a refill on the Phenergan suppositories because they have helped with nausea.

## 2019-11-08 ENCOUNTER — Encounter
Admission: RE | Admit: 2019-11-08 | Discharge: 2019-11-08 | Disposition: A | Discharge status: Home / Self Care | Payer: BC Managed Care – PPO | Source: Ambulatory Visit | Attending: Obstetrics and Gynecology | Admitting: Obstetrics and Gynecology

## 2019-11-08 DIAGNOSIS — C538 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 uteri: Secondary | ICD-10-CM | POA: Diagnosis present

## 2019-11-08 LAB — GLUCOSE, CAPILLARY: Glucose-Capillary: 103 mg/dL — ABNORMAL HIGH (ref 70–99)

## 2019-11-08 MED ORDER — FLUDEOXYGLUCOSE F - 18 (FDG) INJECTION
9.6000 | Freq: Once | INTRAVENOUS | Status: AC | PRN
  Administered 2019-11-08: 9.9 via INTRAVENOUS

## 2019-11-10 ENCOUNTER — Telehealth: Payer: Self-pay

## 2019-11-10 NOTE — Telephone Encounter (Signed)
Reviewed PET results with daughter, [name]. Per Dr. Fransisca Connors, scan improved. Keep appointment with Dr. Christel Mormon, at [facility], as planned. She verbalized understanding.

## 2019-11-13 ENCOUNTER — Other Ambulatory Visit: Payer: Self-pay | Admitting: *Deleted

## 2019-11-13 ENCOUNTER — Encounter: Payer: Self-pay | Admitting: Surgical

## 2019-11-14 MED ORDER — OLANZAPINE 10 MG PO TABS: 10.0000 mg | ORAL_TABLET | Freq: Every day | ORAL | 0 refills | Status: DC

## 2019-12-11 ENCOUNTER — Other Ambulatory Visit: Payer: Self-pay

## 2019-12-11 ENCOUNTER — Ambulatory Visit
Admission: RE | Admit: 2019-12-11 | Discharge: 2019-12-11 | Disposition: A | Discharge status: Home / Self Care | Payer: BC Managed Care – PPO | Source: Ambulatory Visit | Attending: Radiation Oncology | Admitting: Radiation Oncology

## 2019-12-11 ENCOUNTER — Encounter: Payer: Self-pay | Admitting: Radiation Oncology

## 2019-12-11 ENCOUNTER — Other Ambulatory Visit: Payer: Self-pay | Admitting: *Deleted

## 2019-12-11 VITALS — BP 126/64 | HR 87 | Temp 98.6°F | Wt 183.0 lb

## 2019-12-11 DIAGNOSIS — Z9221 Personal history of antineoplastic chemotherapy: Secondary | ICD-10-CM | POA: Diagnosis not present

## 2019-12-11 DIAGNOSIS — Z923 Personal history of irradiation: Secondary | ICD-10-CM | POA: Diagnosis not present

## 2019-12-11 DIAGNOSIS — C538 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 uteri: Secondary | ICD-10-CM | POA: Insufficient documentation

## 2019-12-11 DIAGNOSIS — R197 Diarrhea, unspecified: Secondary | ICD-10-CM | POA: Insufficient documentation

## 2019-12-11 DIAGNOSIS — R110 Nausea: Secondary | ICD-10-CM | POA: Diagnosis not present

## 2019-12-11 DIAGNOSIS — C530 Malignant neoplasm of endocervix: Secondary | ICD-10-CM

## 2019-12-11 MED ORDER — OMEPRAZOLE 20 MG PO CPDR: 20.0000 mg | DELAYED_RELEASE_CAPSULE | Freq: Every day | ORAL | 1 refills | Status: DC

## 2019-12-11 MED ORDER — OLANZAPINE 10 MG PO TABS: 10.0000 mg | ORAL_TABLET | Freq: Every day | ORAL | 0 refills | Status: DC

## 2019-12-11 NOTE — Progress Notes (Signed)
Radiation Oncology Follow up Note  Name: Jill Rodriguez   Date:   12/11/2019 MRN:  [id number] DOB: Jun 21, 1960    This 59 y.o. female presents to the clinic today for 1 month follow-up status post completion of chemotherapy plus external beam radiation therapy to her pelvis as well as brachytherapy at [facility] by Dr. 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  REFERRING PROVIDER: Crissie Figures, PA-C  HPI: Patient is a 59 year old Spanish-speaking female accompanied by her daughter who is now 1 month out completing radiation therapy. She had both external beam treatment to her pelvis as well as brachytherapy performed at [facility] by Dr. Christel Mormon for stage IIIc squamous cell carcinoma. She has developed some intractable nausea secondary to chemotherapy which is being managed symptomatically by medical oncology. Other than that she does have some slight burning on urination and frequency of urination. She also has occasional intermittent diarrhea.. We dose painted some of her hypermetabolic pelvic lymph nodes up to 6000 cGy treated the rest of her nodes and cervix to 5000 cGy. She had a PET CT scan in July showing marked decrease in hypermetabolic activity associated with the cervix and lower uterine segment. Multiple small hypermetabolic retroperitoneal and pelvic lymph nodes decreased in size and hypermetabolic him consistent with response to therapy. Left supraclavicular mediastinal nodes resolved.  COMPLICATIONS OF TREATMENT: none  FOLLOW UP COMPLIANCE: keeps appointments   PHYSICAL EXAM:  BP 126/64    Pulse 87    Temp 98.6 F (37 C) (Tympanic)    Wt 183 lb (83 kg)    BMI 33.91 kg/m  Well-developed well-nourished patient in NAD. HEENT reveals PERLA, EOMI, discs not visualized.  Oral cavity is clear. No oral mucosal lesions are identified. Neck is clear without evidence of cervical or supraclavicular adenopathy. Lungs are clear to A&P. Cardiac examination is essentially unremarkable  with regular rate and rhythm without murmur rub or thrill. Abdomen is benign with no organomegaly or masses noted. Motor sensory and DTR levels are equal and symmetric in the upper and lower extremities. Cranial nerves II through XII are grossly intact. Proprioception is intact. No peripheral adenopathy or edema is identified. No motor or sensory levels are noted. Crude visual fields are within normal range.  RADIOLOGY RESULTS: PET CT scan from July reviewed and compatible with above-stated findings  PLAN: Present time patient is doing well recovering from concurrent chemoradiation. I am pleased with her overall progress. She continues symptomatic management for her nausea through medical oncology she does not seem to have lost significant weight. I have asked to see her back in 4 to 5 months for follow-up. I made sure she has follow-up appointments with Dr. Fransisca Connors in GYN oncology. Patient knows to call with any concerns.  I would like to take this opportunity to thank you for allowing me to participate in the care of your patient.Noreene Filbert, MD

## 2020-01-29 ENCOUNTER — Encounter: Payer: Self-pay | Admitting: Emergency Medicine

## 2020-01-29 ENCOUNTER — Telehealth: Payer: Self-pay | Admitting: *Deleted

## 2020-01-29 ENCOUNTER — Other Ambulatory Visit: Payer: Self-pay

## 2020-01-29 DIAGNOSIS — A084 Viral intestinal infection, unspecified: Secondary | ICD-10-CM | POA: Diagnosis not present

## 2020-01-29 DIAGNOSIS — Z8541 Personal history of malignant neoplasm of cervix uteri: Secondary | ICD-10-CM | POA: Diagnosis not present

## 2020-01-29 DIAGNOSIS — N3001 Acute cystitis with hematuria: Secondary | ICD-10-CM | POA: Insufficient documentation

## 2020-01-29 DIAGNOSIS — R112 Nausea with vomiting, unspecified: Secondary | ICD-10-CM | POA: Diagnosis present

## 2020-01-29 DIAGNOSIS — Z8616 Personal history of COVID-19: Secondary | ICD-10-CM | POA: Diagnosis not present

## 2020-01-29 DIAGNOSIS — Z20822 Contact with and (suspected) exposure to covid-19: Secondary | ICD-10-CM | POA: Diagnosis not present

## 2020-01-29 LAB — CBC WITH DIFFERENTIAL/PLATELET
Abs Immature Granulocytes: 0.04 10*3/uL (ref 0.00–0.07)
Basophils Absolute: 0 10*3/uL (ref 0.0–0.1)
Basophils Relative: 1 %
Eosinophils Absolute: 0.1 10*3/uL (ref 0.0–0.5)
Eosinophils Relative: 2 %
HCT: 33.5 % — ABNORMAL LOW (ref 36.0–46.0)
Hemoglobin: 12.5 g/dL (ref 12.0–15.0)
Immature Granulocytes: 1 %
Lymphocytes Relative: 17 %
Lymphs Abs: 0.9 10*3/uL (ref 0.7–4.0)
MCH: 36.9 pg — ABNORMAL HIGH (ref 26.0–34.0)
MCHC: 37.3 g/dL — ABNORMAL HIGH (ref 30.0–36.0)
MCV: 98.8 fL (ref 80.0–100.0)
Monocytes Absolute: 0.7 10*3/uL (ref 0.1–1.0)
Monocytes Relative: 13 %
Neutro Abs: 3.4 10*3/uL (ref 1.7–7.7)
Neutrophils Relative %: 66 %
Platelets: 298 10*3/uL (ref 150–400)
RBC: 3.39 MIL/uL — ABNORMAL LOW (ref 3.87–5.11)
RDW: 12.6 % (ref 11.5–15.5)
Smear Review: NORMAL
WBC: 4.9 10*3/uL (ref 4.0–10.5)
nRBC: 0 % (ref 0.0–0.2)

## 2020-01-29 LAB — URINALYSIS, COMPLETE (UACMP) WITH MICROSCOPIC
Bilirubin Urine: NEGATIVE
Glucose, UA: NEGATIVE mg/dL
Ketones, ur: 5 mg/dL — AB
Nitrite: NEGATIVE
Protein, ur: 100 mg/dL — AB
Specific Gravity, Urine: 1.021 (ref 1.005–1.030)
WBC, UA: >50 WBC/hpf — ABNORMAL HIGH (ref 0–5)
pH: 5 (ref 5.0–8.0)

## 2020-01-29 LAB — COMPREHENSIVE METABOLIC PANEL
ALT: 27 U/L (ref 0–44)
AST: 38 U/L (ref 15–41)
Albumin: 3.9 g/dL (ref 3.5–5.0)
Alkaline Phosphatase: 73 U/L (ref 38–126)
Anion gap: 10 (ref 5–15)
BUN: 10 mg/dL (ref 6–20)
CO2: 25 mmol/L (ref 22–32)
Calcium: 9.4 mg/dL (ref 8.9–10.3)
Chloride: 103 mmol/L (ref 98–111)
Creatinine, Ser: 0.57 mg/dL (ref 0.44–1.00)
GFR calc Af Amer: >60 mL/min (ref >60)
GFR calc non Af Amer: >60 mL/min (ref >60)
Glucose, Bld: 101 mg/dL — ABNORMAL HIGH (ref 70–99)
Potassium: 3.6 mmol/L (ref 3.5–5.1)
Sodium: 138 mmol/L (ref 135–145)
Total Bilirubin: 0.6 mg/dL (ref 0.3–1.2)
Total Protein: 7.9 g/dL (ref 6.5–8.1)

## 2020-01-29 LAB — LIPASE, BLOOD: Lipase: 19 U/L (ref 11–51)

## 2020-01-29 NOTE — Telephone Encounter (Signed)
Call returned to daughter and advised of doctor response and to take her to ER for evaluation. She asked about going to an Urgent Care and I advised that she needs to go where they can do xrays so he agreed to take her to ER

## 2020-01-29 NOTE — ED Triage Notes (Signed)
Pt presents to ED via POV with daughter. Pt c/o generalized abdominal pain, N/V/D x several days ago. Pt's daughter reports currently undergoing treatment for cervical cancer. Pt's daughter reports decreased appetite x 4-5 days.   Pt speaks [place] (dialect found in [place]), however pt does not speak Spanish, pt's daughter in triage to interpret for patient.

## 2020-01-29 NOTE — Telephone Encounter (Signed)
She is not on any treatment so these acute symptoms are not treatment related. She may have gastroenteritis. Recommend ER

## 2020-01-29 NOTE — Telephone Encounter (Signed)
Daughter called reporting that patient has been having diarrhea for more than a week and has tried over the counter medications. She also reports that patient has not eaten in 4 days, has pain entire abdominal from uterus to below breasts and feels like she is going to "pop". She has had chills and fever times 4 days, but tested Negative for COVID Saturday. Patient feels as she has lost weight but has not weighed. She is vomiting clear liquid. She is asking to be seen today. Please advise

## 2020-01-30 ENCOUNTER — Emergency Department
Admission: EM | Admit: 2020-01-30 | Discharge: 2020-01-30 | Disposition: A | Discharge status: Home / Self Care | Payer: BC Managed Care – PPO | Attending: Emergency Medicine | Admitting: Emergency Medicine

## 2020-01-30 ENCOUNTER — Emergency Department: Payer: BC Managed Care – PPO

## 2020-01-30 DIAGNOSIS — N3001 Acute cystitis with hematuria: Secondary | ICD-10-CM

## 2020-01-30 DIAGNOSIS — A084 Viral intestinal infection, unspecified: Secondary | ICD-10-CM

## 2020-01-30 LAB — LACTIC ACID, PLASMA: Lactic Acid, Venous: 1.2 mmol/L (ref 0.5–1.9)

## 2020-01-30 LAB — RESPIRATORY PANEL BY RT PCR (FLU A&B, COVID)
Influenza A by PCR: NEGATIVE
Influenza B by PCR: NEGATIVE
SARS Coronavirus 2 by RT PCR: NEGATIVE

## 2020-01-30 MED ORDER — ONDANSETRON HCL 4 MG/2ML IJ SOLN
4.0000 mg | Freq: Once | INTRAMUSCULAR | Status: AC
  Administered 2020-01-30: 4 mg via INTRAVENOUS
  Filled 2020-01-30: qty 2

## 2020-01-30 MED ORDER — TRAMADOL HCL 50 MG PO TABS: 50.0000 mg | ORAL_TABLET | Freq: Four times a day (QID) | ORAL | 0 refills | Status: DC | PRN

## 2020-01-30 MED ORDER — ONDANSETRON 4 MG PO TBDP: 4.0000 mg | ORAL_TABLET | Freq: Three times a day (TID) | ORAL | 0 refills | Status: DC | PRN

## 2020-01-30 MED ORDER — CEPHALEXIN 500 MG PO CAPS
500.0000 mg | ORAL_CAPSULE | Freq: Three times a day (TID) | ORAL | 0 refills | Status: AC
Start: 2020-01-30 — End: 2020-02-06

## 2020-01-30 MED ORDER — SODIUM CHLORIDE 0.9 % IV SOLN
1.0000 g | Freq: Once | INTRAVENOUS | Status: AC
  Administered 2020-01-30: 1 g via INTRAVENOUS
  Filled 2020-01-30: qty 10

## 2020-01-30 MED ORDER — OXYCODONE HCL 5 MG PO TABS
5.0000 mg | ORAL_TABLET | Freq: Once | ORAL | Status: AC
  Administered 2020-01-30: 5 mg via ORAL
  Filled 2020-01-30: qty 1

## 2020-01-30 MED ORDER — LACTATED RINGERS IV BOLUS
1000.0000 mL | Freq: Once | INTRAVENOUS | Status: AC
  Administered 2020-01-30: 1000 mL via INTRAVENOUS

## 2020-01-30 MED ORDER — FENTANYL CITRATE (PF) 100 MCG/2ML IJ SOLN
50.0000 ug | Freq: Once | INTRAMUSCULAR | Status: AC
  Administered 2020-01-30: 50 ug via INTRAVENOUS
  Filled 2020-01-30: qty 2

## 2020-01-30 MED ORDER — ACETAMINOPHEN 500 MG PO TABS
1000.0000 mg | ORAL_TABLET | Freq: Once | ORAL | Status: AC
  Administered 2020-01-30: 1000 mg via ORAL
  Filled 2020-01-30: qty 2

## 2020-01-30 NOTE — ED Notes (Signed)
Pt medicated per MAR. DC reviewed by provider. Denies needs or concerns. Daughter remains at bedside. AO x4

## 2020-01-30 NOTE — ED Provider Notes (Signed)
[facility] Emergency Department Provider Note  ____________________________________________  Time seen: Approximately 2:50 AM  I have reviewed the triage vital signs and the nursing notes.   HISTORY  Chief Complaint Diarrhea, Nausea, and Emesis   HPI Jill Rodriguez is a 59 y.o. female with a history of cervical cancer not on any current therapies who presents for evaluation of nausea, vomiting, and abdominal pain.  Patient has been sick for 7 days.  Household members with similar symptoms.  She reports anywhere between 5-8 episodes of diarrhea during the day and 8-10 episodes during the night.  Diarrhea is watery with no melena or blood.  Also has had the same number of episodes of nonbloody nonbilious emesis.  Also complaining of fever, body aches, headache.  She is complaining of diffuse moderate and constant cramping abdominal pain.  She tested negative for Covid 2 days ago.  She is vaccinated against Covid.  She denies any prior history of C. difficile or recent antibiotic use.  She denies dysuria or hematuria, cough, chest pain or shortness of breath.    Past Medical History:  Diagnosis Date  . Cancer ([place])   . COVID-19 10/2018  . Nausea with vomiting 10/31/2019  . No pertinent past medical history     Patient Active Problem List   Diagnosis Date Noted  . Nausea with vomiting 10/31/2019  . Heartburn 10/17/2019  . 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 ([place]) 10/10/2019  . Encounter for antineoplastic chemotherapy 10/10/2019  . Non-intractable vomiting 10/10/2019  . Thrush 10/10/2019  . Malignant neoplasm of endocervix ([place]) 10/03/2019  . Goals of care, counseling/discussion 09/20/2019  . Cervical cancer ([place]) 08/30/2019    Past Surgical History:  Procedure Laterality Date  . no surgical history      Prior to Admission medications   Medication Sig Start Date End Date Taking? Authorizing Provider  cephALEXin (KEFLEX) 500 MG  capsule Take 1 capsule (500 mg total) by mouth 3 (three) times daily for 7 days. 01/30/20 02/06/20  Rudene Re, MD  ciprofloxacin (CIPRO) 500 MG tablet Take 1 tablet (500 mg total) by mouth 2 (two) times daily. Patient not taking: Reported on 11/07/2019 10/31/19   Earlie Server, MD  dexamethasone (DECADRON) 4 MG tablet Take 2 tablets (8 mg total) by mouth See admin instructions. Take 8mg  daily for 2 days after each chemotherapy-cisplatin treatments. Patient not taking: Reported on 11/07/2019 10/10/19   Earlie Server, MD  nystatin (MYCOSTATIN) 100000 UNIT/ML suspension Use as directed 5 mLs (500,000 Units total) in the mouth or throat 4 (four) times daily. Patient not taking: Reported on 12/11/2019 10/25/19   Earlie Server, MD  OLANZapine (ZYPREXA) 10 MG tablet Take 1 tablet (10 mg total) by mouth at bedtime. 12/11/19   Earlie Server, MD  omeprazole (PRILOSEC) 20 MG capsule Take 1 capsule (20 mg total) by mouth daily. 12/11/19   Earlie Server, MD  ondansetron (ZOFRAN ODT) 4 MG disintegrating tablet Take 1 tablet (4 mg total) by mouth every 8 (eight) hours as needed. 01/30/20   Rudene Re, MD  prochlorperazine (COMPAZINE) 10 MG tablet Take 1 tablet (10 mg total) by mouth every 6 (six) hours as needed (Nausea or vomiting). 09/20/19   Earlie Server, MD  promethazine (PHENERGAN) 25 MG suppository Place 1 suppository (25 mg total) rectally every 6 (six) hours as needed for nausea, vomiting or refractory nausea / vomiting. 11/07/19   Earlie Server, MD  senna (SENOKOT) 8.6 MG TABS tablet Take 2 tablets (17.2 mg  total) by mouth daily. 10/24/19   Earlie Server, MD  traMADol (ULTRAM) 50 MG tablet Take 1 tablet (50 mg total) by mouth every 6 (six) hours as needed. 01/30/20 01/29/21  Rudene Re, MD    Allergies Patient has no known allergies.  Family History  Problem Relation Age of Onset  . Blindness Mother   . Glaucoma Mother   . Diabetes Father   . Diabetes Brother     Social History Social History   Tobacco Use  . Smoking  status: Never Smoker  . Smokeless tobacco: Never Used  Vaping Use  . Vaping Use: Never used  Substance Use Topics  . Alcohol use: Never  . Drug use: Never    Review of Systems  Constitutional: + fever, chills Eyes: Negative for visual changes. ENT: Negative for sore throat. Neck: No neck pain  Cardiovascular: Negative for chest pain. Respiratory: Negative for shortness of breath. Gastrointestinal: + abdominal pain, vomiting and diarrhea. Genitourinary: Negative for dysuria. Musculoskeletal: Negative for back pain. Skin: Negative for rash. Neurological: Negative for weakness or numbness. + HA Psych: No SI or HI  ____________________________________________   PHYSICAL EXAM:  VITAL SIGNS: ED Triage Vitals  Enc Vitals Group     BP 01/29/20 1736 (!) 129/53     Pulse Rate 01/29/20 1736 86     Resp 01/29/20 1736 20     Temp 01/29/20 1736 98.6 F (37 C)     Temp Source 01/29/20 1736 Oral     SpO2 01/29/20 1736 97 %     Weight 01/29/20 1735 182 lb (82.6 kg)     Height 01/29/20 1735 5\' 1"  (1.549 m)     Head Circumference --      Peak Flow --      Pain Score 01/29/20 1735 9     Pain Loc --      Pain Edu? --      Excl. in [place]? --     Constitutional: Alert and oriented. Well appearing and in no apparent distress. HEENT:      Head: Normocephalic and atraumatic.         Eyes: Conjunctivae are normal. Sclera is non-icteric.       Mouth/Throat: Mucous membranes are moist.       Neck: Supple with no signs of meningismus. Cardiovascular: Regular rate and rhythm. No murmurs, gallops, or rubs. 2+ symmetrical distal pulses are present in all extremities. No JVD. Respiratory: Normal respiratory effort. Lungs are clear to auscultation bilaterally. No wheezes, crackles, or rhonchi.  Gastrointestinal: Soft, mild diffuse tenderness, non distended with positive bowel sounds. No rebound or guarding. Genitourinary: No CVA tenderness. Musculoskeletal: No edema, cyanosis, or erythema of  extremities. Neurologic: Normal speech and language. Face is symmetric. Moving all extremities. No gross focal neurologic deficits are appreciated. Skin: Skin is warm, dry and intact. No rash noted. Psychiatric: Mood and affect are normal. Speech and behavior are normal.  ____________________________________________   LABS (all labs ordered are listed, but only abnormal results are displayed)  Labs Reviewed  CBC WITH DIFFERENTIAL/PLATELET - Abnormal; Notable for the following components:      Result Value   RBC 3.39 (*)    HCT 33.5 (*)    MCH 36.9 (*)    MCHC 37.3 (*)    All other components within normal limits  COMPREHENSIVE METABOLIC PANEL - Abnormal; Notable for the following components:   Glucose, Bld 101 (*)    All other components within normal limits  URINALYSIS, COMPLETE (UACMP)  WITH MICROSCOPIC - Abnormal; Notable for the following components:   Color, Urine AMBER (*)    APPearance CLOUDY (*)    Hgb urine dipstick SMALL (*)    Ketones, ur 5 (*)    Protein, ur 100 (*)    Leukocytes,Ua MODERATE (*)    WBC, UA >50 (*)    Bacteria, UA MANY (*)    All other components within normal limits  URINE CULTURE  RESPIRATORY PANEL BY RT PCR (FLU A&B, COVID)  CULTURE, BLOOD (ROUTINE X 2)  CULTURE, BLOOD (ROUTINE X 2)  GASTROINTESTINAL PANEL BY PCR, STOOL (REPLACES STOOL CULTURE)  C DIFFICILE QUICK SCREEN W PCR REFLEX  LIPASE, BLOOD  LACTIC ACID, PLASMA  LACTIC ACID, PLASMA   ____________________________________________  EKG   ED ECG REPORT I, Rudene Re, the attending physician, personally viewed and interpreted this ECG.  Normal sinus rhythm with occasional PVCs, rate of 88, normal intervals, normal axis, no ST elevations or depressions. ____________________________________________  RADIOLOGY  I have personally reviewed the images performed during this visit and I agree with the Radiologist's read.   Interpretation by Radiologist:  DG Chest Portable 1  View  Result Date: 01/30/2020 CLINICAL DATA:  Viral syndrome. EXAM: PORTABLE CHEST 1 VIEW COMPARISON:  November 08, 2019 FINDINGS: There are few small linear airspace opacities at the lung bases bilaterally. These are favored to represent areas of atelectasis as seen on the patient's prior PET-CT. The heart size is unremarkable. There is no pneumothorax or large pleural effusion. There is mild elevation of the left hemidiaphragm. IMPRESSION: No active disease. Electronically Signed   By: Constance Holster M.D.   On: 01/30/2020 03:07      ____________________________________________   PROCEDURES  Procedure(s) performed:yes .1-3 Lead EKG Interpretation Performed by: Rudene Re, MD Authorized by: Rudene Re, MD     Interpretation: non-specific     ECG rate assessment: normal     Rhythm: sinus rhythm     Ectopy: none     Critical Care performed:  None ____________________________________________   INITIAL IMPRESSION / ASSESSMENT AND PLAN / ED COURSE   59 y.o. female with a history of cervical cancer not on any current therapies who presents for evaluation of nausea, vomiting, abdominal pain, fever, HA, body aches x 1 week.  Patient is extremely well-appearing in no distress with normal vital signs, here she is afebrile, no meningismus, no rash, lungs are clear, abdomen is soft and nondistended with mild diffuse tenderness.  No localized tenderness, rebound or guarding.  Differential diagnosis including Covid versus flu versus viral gastroenteritis versus C. Difficile.  Labs show no leukocytosis, no thrombocytosis, no AKI, no anemia, no electrolyte derangements, normal LFTs and lipase.  UA looks positive for UTI.  No signs of sepsis.  We will check for C. difficile and stool cultures.  Will swab for Covid and flu. WIll send urine for culture, will get lactic and blood cultures.  Will give IV fentanyl, zofran, IVF, and IV rocephin.  History gathered from patient and her  daughter who was at bedside.  Plan discussed with both of them.  Old medical records reviewed.  Patient placed on telemetry for close monitoring.  _________________________ 4:54 AM on 01/30/2020 -----------------------------------------  Patient kept in the room for 2 hours with no further episodes of vomiting or diarrhea. Unable to provide a stool sample. Symptoms improved after above given medications. Serial abdominal exams benign with no localized tenderness, rebound or guarding. Covid and flu negative. Will discharge home on Keflex, Zofran, short  course of tramadol and close follow-up with primary care doctor. Discussed my standard return precautions with patient and her daughter.    _____________________________________________ Please note:  Patient was evaluated in Emergency Department today for the symptoms described in the history of present illness. Patient was evaluated in the context of the global COVID-19 pandemic, which necessitated consideration that the patient might be at risk for infection with the SARS-CoV-2 virus that causes COVID-19. Institutional protocols and algorithms that pertain to the evaluation of patients at risk for COVID-19 are in a state of rapid change based on information released by regulatory bodies including the CDC and federal and state organizations. These policies and algorithms were followed during the patient's care in the ED.  Some ED evaluations and interventions may be delayed as a result of limited staffing during the pandemic.   [employer] Controlled Substance Database was reviewed by me. ____________________________________________   FINAL CLINICAL IMPRESSION(S) / ED DIAGNOSES   Final diagnoses:  Viral gastroenteritis  Acute cystitis with hematuria      NEW MEDICATIONS STARTED DURING THIS VISIT:  ED Discharge Orders         Ordered    ondansetron (ZOFRAN ODT) 4 MG disintegrating tablet  Every 8 hours PRN        01/30/20 0454    traMADol  (ULTRAM) 50 MG tablet  Every 6 hours PRN        01/30/20 0454    cephALEXin (KEFLEX) 500 MG capsule  3 times daily        01/30/20 0455           Note:  This document was prepared using Dragon voice recognition software and may include unintentional dictation errors.    Rudene Re, MD 01/30/20 [phone]

## 2020-01-30 NOTE — ED Notes (Signed)
Assumed care of pt upon being roomed. Reports generalized abdominal pain, diarrhea, and yellow vomit. IV placed and pt medicated per MAR. Denies hx of previous GI/GU surgeries. Daughter at bedside. AO x3. Talking in full sentences with regular and unlabored breathing.

## 2020-01-30 NOTE — ED Notes (Signed)
Pt passed PO challenge.

## 2020-01-31 ENCOUNTER — Encounter: Payer: Self-pay | Admitting: Obstetrics and Gynecology

## 2020-01-31 ENCOUNTER — Inpatient Hospital Stay: Payer: BC Managed Care – PPO | Attending: Obstetrics and Gynecology | Admitting: Obstetrics and Gynecology

## 2020-01-31 ENCOUNTER — Other Ambulatory Visit: Payer: Self-pay

## 2020-01-31 VITALS — BP 134/62 | HR 79 | Resp 16 | Wt 181.2 lb

## 2020-01-31 DIAGNOSIS — Z9221 Personal history of antineoplastic chemotherapy: Secondary | ICD-10-CM | POA: Insufficient documentation

## 2020-01-31 DIAGNOSIS — Z8616 Personal history of COVID-19: Secondary | ICD-10-CM | POA: Insufficient documentation

## 2020-01-31 DIAGNOSIS — R12 Heartburn: Secondary | ICD-10-CM | POA: Insufficient documentation

## 2020-01-31 DIAGNOSIS — Z923 Personal history of irradiation: Secondary | ICD-10-CM | POA: Diagnosis not present

## 2020-01-31 DIAGNOSIS — C538 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 uteri: Secondary | ICD-10-CM | POA: Diagnosis present

## 2020-01-31 LAB — URINE CULTURE: Culture: >=100000 — AB

## 2020-01-31 NOTE — Progress Notes (Signed)
Gynecologic Oncology Interval Visit   Referring Provider: Dr. Marcelline Mates  Chief Concern: cervical cancer, stage IIIC2  Subjective:  Jill Rodriguez is a 59 y.o. 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  She received 5 cycles of cisplatin 10/03/19-11/02/19 with EBRT. EBRT treatment: received 5000 cGy to cervix and pelvic nodal basins with boost to 6000 cGy targeting hypermetabolic LNs. Completed on 11/07/2019.  She previously had small PET-avid mediastinal and supraclavicular LNs on imaging which had resolved on July PET.   She complains of poor appetite throughout treatment with associated 5 lb weight loss. Says she has tiredness and weakness with low motivation. Ongoing pain and swelling of the left leg which she was seen for by her PCP. She has had mammogram ordered by her PCP but never scheduled.   Seen in ED for gastroenteritis for 1 week, presumed viral.  Negative COVID test and she is vaccinated.  Also complains of intermittent left leg pain.    Gynecologic Oncology History:  Bleeding for years.  She had COVID in July 2020 and was supposed to have follow up for long term vaginal bleeding but not seen until 4/20.  Patient seen by Dr Marcelline Mates 08/16/19 for PMB. " [id number] Spanish speaking post-menopausal female who presents as a referral from [facility] for concerns regarding postmenopausal bleeding. She has been menopausal since age 34. She has never been on HRT. Bleeding has been ongoing for the past several months per records from the past visit, however patient reports that it has been off and on for the past year. She reports passage of clots at times, and otherwise sometimes is just spotting.  Found to have cervical mass and endometrial biopsy showed poorly differentiated squamous cell cancer. No endometrium seen. CERVIX:friable,  lesion present: cervix with irregularly shaped growth that is possibly eroding the cervix, firm.  08/23/19 Pelvic US The Endometriummeasures 8 mm. Echogenic endometrial microcalcifications seen through -out endometrium. An hypoechoic area seen in the fundal aspect of the endometrium measuring 0.8 x 1.1 x 2.3 cm Right Ovary measures 1.6 x 1.1 x 1.2 cm. It is normal in appearance. Left Ovary measures 2.2 x 1.5 x 1.7 cm. It is normal in appearance. Survey of the adnexa demonstrates no adnexal masses. There is no free fluid in the cul de sac.  09/06/19 PET/CT IMPRESSION: 1. Hypermetabolic mass involving the cervix and lower uterine segment consistent with known cervical cancer. 2. Multiple small hypermetabolic retroperitoneal and pelvic lymph nodes bilaterally consistent with nodal metastases. Small hypermetabolic lymph nodes in the left superior mediastinum and left supraclavicular regions are also suspicious for nodal metastases. 3. Hepatic steatosis and cholelithiasis noted.  Stage IIIC2 SCC of cervix and received 5 cycles cisplatin and EBRT followed by boost to LNs followed by vaginal brachytherapy completed 12/08/19  PET/CT 11/08/19 IMPRESSION: 1. Marked decrease in hypermetabolism associated with the cervix and lower uterine segment consistent with interval response to therapy. 2. The multiple small hypermetabolic retroperitoneal and pelvic sidewall lymph nodes seen on the previous study have decreased in size and hypermetabolism in the interval. FDG uptake in most of these nodes today is at blood pool levels. Findings consistent with response to therapy. 3. Left supraclavicular and mediastinal hypermetabolic nodes have resolved. 4. Increased FDG accumulation in the distal esophagus. No mass lesion evident by CT. This may reflect esophagitis, but direct visualization may be  warranted.  Last pap smear: 2019.  Patient denies history of abnormal pap smears.  Mammogram: 2015.  Ordered by PCP in  December 2020.  CT angio of neck on 09/13/19 for concerns of palpable, pulsatile mass in R neck which was unremarkable.  Problem List: Patient Active Problem List   Diagnosis Date Noted  . Nausea with vomiting 10/31/2019  . Heartburn 10/17/2019  . 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 ([place]) 10/10/2019  . Encounter for antineoplastic chemotherapy 10/10/2019  . Non-intractable vomiting 10/10/2019  . Thrush 10/10/2019  . Malignant neoplasm of endocervix ([place]) 10/03/2019  . Goals of care, counseling/discussion 09/20/2019  . Cervical cancer ([place]) 08/30/2019    Past Medical History: Past Medical History:  Diagnosis Date  . Cancer ([place])   . COVID-19 10/2018  . Nausea with vomiting 10/31/2019  . No pertinent past medical history     Past Surgical History: Past Surgical History:  Procedure Laterality Date  . no surgical history      OB History:  OB History  Gravida Para Term Preterm AB Living  3 3 3     3   SAB TAB Ectopic Multiple Live Births          3    # Outcome Date GA Lbr Len/2nd Weight Sex Delivery Anes PTL Lv  3 Term 28    F Vag-Spont   LIV  2 Term 1985    F Vag-Spont   LIV  1 Term 1    M Vag-Spont   LIV    Family History: Family History  Problem Relation Age of Onset  . Blindness Mother   . Glaucoma Mother   . Diabetes Father   . Diabetes Brother     Social History: Social History   Socioeconomic History  . Marital status: Married    Spouse name: Not on file  . Number of children: Not on file  . Years of education: Not on file  . Highest education level: Not on file  Occupational History  . Not on file  Tobacco Use  . Smoking status: Never Smoker  . Smokeless tobacco: Never Used  Vaping Use  . Vaping Use: Never used  Substance and Sexual Activity  . Alcohol use: Never  . Drug use: Never  . Sexual activity: Yes    Birth control/protection: None  Other Topics Concern  . Not on file  Social History Narrative  . Not on file    Social Determinants of Health   Financial Resource Strain:   . Difficulty of Paying Living Expenses: Not on file  Food Insecurity:   . Worried About Charity fundraiser in the Last Year: Not on file  . Ran Out of Food in the Last Year: Not on file  Transportation Needs:   . Lack of Transportation (Medical): Not on file  . Lack of Transportation (Non-Medical): Not on file  Physical Activity:   . Days of Exercise per Week: Not on file  . Minutes of Exercise per Session: Not on file  Stress:   . Feeling of Stress : Not on file  Social Connections:   . Frequency of Communication with Friends and Family: Not on file  . Frequency of Social Gatherings with Friends and Family: Not on file  . Attends Religious Services: Not on file  . Active Member of Clubs or Organizations: Not on file  . Attends Archivist Meetings: Not on file  . Marital Status: Not on file  Intimate Partner Violence:   .  Fear of Current or Ex-Partner: Not on file  . Emotionally Abused: Not on file  . Physically Abused: Not on file  . Sexually Abused: Not on file    Allergies: No Known Allergies  Current Medications: Current Outpatient Medications  Medication Sig Dispense Refill  . cephALEXin (KEFLEX) 500 MG capsule Take 1 capsule (500 mg total) by mouth 3 (three) times daily for 7 days. 21 capsule 0  . ciprofloxacin (CIPRO) 500 MG tablet Take 1 tablet (500 mg total) by mouth 2 (two) times daily. (Patient not taking: Reported on 11/07/2019) 10 tablet 0  . dexamethasone (DECADRON) 4 MG tablet Take 2 tablets (8 mg total) by mouth See admin instructions. Take 8mg  daily for 2 days after each chemotherapy-cisplatin treatments. (Patient not taking: Reported on 11/07/2019) 24 tablet 0  . nystatin (MYCOSTATIN) 100000 UNIT/ML suspension Use as directed 5 mLs (500,000 Units total) in the mouth or throat 4 (four) times daily. (Patient not taking: Reported on 12/11/2019) 120 mL 0  . OLANZapine (ZYPREXA) 10 MG tablet  Take 1 tablet (10 mg total) by mouth at bedtime. 30 tablet 0  . omeprazole (PRILOSEC) 20 MG capsule Take 1 capsule (20 mg total) by mouth daily. 30 capsule 1  . ondansetron (ZOFRAN ODT) 4 MG disintegrating tablet Take 1 tablet (4 mg total) by mouth every 8 (eight) hours as needed. 20 tablet 0  . prochlorperazine (COMPAZINE) 10 MG tablet Take 1 tablet (10 mg total) by mouth every 6 (six) hours as needed (Nausea or vomiting). 30 tablet 1  . promethazine (PHENERGAN) 25 MG suppository Place 1 suppository (25 mg total) rectally every 6 (six) hours as needed for nausea, vomiting or refractory nausea / vomiting. 12 each 1  . senna (SENOKOT) 8.6 MG TABS tablet Take 2 tablets (17.2 mg total) by mouth daily. 120 tablet 0  . traMADol (ULTRAM) 50 MG tablet Take 1 tablet (50 mg total) by mouth every 6 (six) hours as needed. 12 tablet 0   No current facility-administered medications for this visit.    Review of Systems General:  Fatigue, weakness Skin: no complaints Eyes: no complaints HEENT: no complaints Breasts: no complaints Pulmonary: no complaints Cardiac: leg swelling Gastrointestinal: abdominal pain, decreased appetite, nausea, vomiting, diarrhea, constipation Genitourinary/Sexual: no complaints Ob/Gyn: pelvic pain Musculoskeletal: no complaints Hematology: no complaints Neurologic/Psych: numbness and tingling of hands, depression  Objective:  Physical Examination:  BP 134/62   Pulse 79   Resp 16   Wt 181 lb 3.2 oz (82.2 kg)   SpO2 99%   BMI 34.24 kg/m    ECOG Performance Status: 1 - Symptomatic but completely ambulatory  GENERAL: Patient is a well appearing female in no acute distress HEENT:  Sclera clear. Anicteric NODES:  Negative axillary, supraclavicular, inguinal lymph node survery LUNGS:  Clear to auscultation bilaterally.   HEART:  Regular rate and rhythm.  ABDOMEN:  Soft, nontender.  No hernias, incisions well healed. No masses or ascites EXTREMITIES:  No peripheral  edema. Atraumatic. No cyanosis SKIN:  Clear with no obvious rashes or skin changes.  NEURO:  Nonfocal. Well oriented.  Appropriate affect.  Pelvic: exam chaperoned by nurse;  Vulva: normal appearing vulva with no masses, tenderness or lesions; Vagina: normal vagina; Adnexa: normal adnexa in size, nontender and no masses; Uterus: uterus is normal size, shape, consistency and nontender; Cervix: some radiation effect on mucosa, but no mass or bleeding; Rectal: confirms, no parametrial involvement  Lab Review Labs on site today: Lab Results  Component Value Date  WBC 4.9 01/29/2020   HGB 12.5 01/29/2020   HCT 33.5 (L) 01/29/2020   MCV 98.8 01/29/2020   PLT 298 01/29/2020       Assessment:  Yasmene Salomone is a 59 y.o.  female diagnosed with poorly differentiated squamous cell cervical cancer. The tumor was about 3 cm in diameter and replaced the whole cervix. Clinical stage IB1, but radiographic stage IIIC2 SCC of cervix with PET positive nodes.  Received 5 cycles cisplatin and EBRT and boost to LNs followed by vaginal brachytherapy completed 12/08/19.    Now with GI symptoms thought in ED yesterday to be viral gastroenteritis. Left leg pain, intermittent.  Pelvic exam normal.   Medical co-morbidities complicating care: none.  Plan:   Problem List Items Addressed This Visit      Genitourinary   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 ([place]) - Primary      Will order PET/CT in view of GI symptoms and left leg pain.  Last PET had some activity in esophageous.  If scan negative and GI symptoms persist will refer to GI for evaluation.   RTC in 3 months for follow up exam.   The patient's diagnosis, an outline of the further diagnostic and laboratory studies which will be required, the recommendation, and alternatives were discussed.  All questions were answered to the patient's satisfaction with her daughter present and interpreting.   Verlon Au, NP  I personally  interviewed and examined the patient. Agreed with the above/below plan of care. I have directly contributed to assessment and plan of care of this patient and educated and discussed with patient and family.  Mellody Drown, MD  CC:  Crissie Figures, PA-C [address] [place],  [place] [address] [phone]

## 2020-02-04 LAB — CULTURE, BLOOD (ROUTINE X 2)
Culture: NO GROWTH
Culture: NO GROWTH

## 2020-02-05 ENCOUNTER — Encounter: Payer: Self-pay | Admitting: Radiology

## 2020-02-06 ENCOUNTER — Ambulatory Visit
Admission: RE | Admit: 2020-02-06 | Discharge: 2020-02-06 | Disposition: A | Discharge status: Home / Self Care | Payer: BC Managed Care – PPO | Source: Ambulatory Visit | Attending: Nurse Practitioner | Admitting: Nurse Practitioner

## 2020-02-06 ENCOUNTER — Other Ambulatory Visit: Payer: Self-pay

## 2020-02-06 DIAGNOSIS — R918 Other nonspecific abnormal finding of lung field: Secondary | ICD-10-CM | POA: Insufficient documentation

## 2020-02-06 DIAGNOSIS — K8020 Calculus of gallbladder without cholecystitis without obstruction: Secondary | ICD-10-CM | POA: Diagnosis not present

## 2020-02-06 DIAGNOSIS — C538 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 uteri: Secondary | ICD-10-CM | POA: Insufficient documentation

## 2020-02-06 DIAGNOSIS — K760 Fatty (change of) liver, not elsewhere classified: Secondary | ICD-10-CM | POA: Insufficient documentation

## 2020-02-06 DIAGNOSIS — I700 Atherosclerosis of aorta: Secondary | ICD-10-CM | POA: Insufficient documentation

## 2020-02-06 LAB — GLUCOSE, CAPILLARY: Glucose-Capillary: 97 mg/dL (ref 70–99)

## 2020-02-06 MED ORDER — FLUDEOXYGLUCOSE F - 18 (FDG) INJECTION
9.4000 | Freq: Once | INTRAVENOUS | Status: AC | PRN
  Administered 2020-02-06: 9.76 via INTRAVENOUS

## 2020-02-08 ENCOUNTER — Telehealth: Payer: Self-pay

## 2020-02-08 DIAGNOSIS — C538 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 uteri: Secondary | ICD-10-CM

## 2020-02-08 DIAGNOSIS — R948 Abnormal results of function studies of other organs and systems: Secondary | ICD-10-CM

## 2020-02-08 NOTE — Telephone Encounter (Signed)
PET scan reviewed with Dr. Tasia Catchings and L. Zenia Resides, NP. Called and went over results and physician recommendations with daughter, [name]. She is agreeable to GI consult for endo. Referral sent to [facility]. Scheduling message sent to arrange follow up with Dr. Tasia Catchings. PD-L1 requested on specimen [id number], collected 08/16/2019, labeled endometrium biopsy.

## 2020-02-12 ENCOUNTER — Inpatient Hospital Stay (HOSPITAL_BASED_OUTPATIENT_CLINIC_OR_DEPARTMENT_OTHER): Payer: BC Managed Care – PPO | Admitting: Oncology

## 2020-02-12 ENCOUNTER — Other Ambulatory Visit: Payer: Self-pay

## 2020-02-12 ENCOUNTER — Ambulatory Visit
Admission: RE | Admit: 2020-02-12 | Discharge: 2020-02-12 | Disposition: A | Discharge status: Home / Self Care | Payer: BC Managed Care – PPO | Source: Ambulatory Visit | Attending: Oncology | Admitting: Oncology

## 2020-02-12 ENCOUNTER — Encounter: Payer: Self-pay | Admitting: Oncology

## 2020-02-12 VITALS — BP 136/81 | HR 78 | Temp 98.1°F | Resp 16 | Wt 186.3 lb

## 2020-02-12 DIAGNOSIS — R112 Nausea with vomiting, unspecified: Secondary | ICD-10-CM | POA: Diagnosis not present

## 2020-02-12 DIAGNOSIS — C538 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 uteri: Secondary | ICD-10-CM | POA: Diagnosis not present

## 2020-02-12 DIAGNOSIS — R590 Localized enlarged lymph nodes: Secondary | ICD-10-CM

## 2020-02-12 DIAGNOSIS — R948 Abnormal results of function studies of other organs and systems: Secondary | ICD-10-CM

## 2020-02-12 DIAGNOSIS — M7989 Other specified soft tissue disorders: Secondary | ICD-10-CM

## 2020-02-12 NOTE — Progress Notes (Signed)
Hematology/Oncology follow up note [facility] [phone] Fax:([phone]   Patient Care Team: Crissie Figures, PA-C as PCP - General (Physician Assistant) Clent Jacks, RN as Oncology Nurse Navigator  REFERRING PROVIDER: Crissie Figures, PA-C  CHIEF COMPLAINTS/REASON FOR VISIT:  Follow up for cervical cancer, treatment related symptoms.  HISTORY OF PRESENTING ILLNESS:   Jill Rodriguez is a  59 y.o.  female with PMH listed below was seen in consultation at the request of  Crissie Figures, PA-C  for evaluation of cervical cancer.   Patient is G3, P3 Spanish-speaking postmenopausal female who initially presented with vaginal bleeding.  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  No intervention seen.  08/23/2019, pelvis ultrasound showed endometrium measures 8 mm.  Echogenic endometrial microcalcification seen throughout endometrium.  Hypoechoic area in the fundal aspect of the endometrium measures 0.8 x 1.1 x 2.3 cm Survey of the adnexa showed no adnexal masses.  Patient has history of prominent right neck pelvis and CT angio neck has been ordered. Patient was seen by Dr. Fransisca Connors, clinically the tumor is 3 cm and replaced whole cervix. 09/06/2019 PET scan showed hypermetabolic mass involving the cervix and the lower uterine segment consistent with known cervical cancer. Multiple small hypermetabolic retroperitoneal and pelvic lymph node bilaterally consistent with nodal metastasis. She also has small hypermetabolic lymph nodes in the left superior mediastinum and left supraclavicular regions are also suspicious for nodal metastasis.  Hepatic steatosis no cholelithiasis noted.  Today patient was accompanied by daughter to discuss management  plan. Patient reports lower abdominal pain.  Vaginal bleeding has stopped since the biopsy.  INTERVAL HISTORY Jill Rodriguez is a 59 y.o. female who has above history reviewed by me today presents for evaluation prior to chemotherapy for treatments of at least stage IIIc cervical cancer.  Problems and complaints are listed below: Patient is accompanied by her daughter. Patient speaks Spanish dialect.  She previously was not able to understand Spanish interpreter and will sign a waiver and prefers her daughter to translate for her. Patient reports poor appetite, nausea, per daughter she only eats 1 meal per day.  She has not lost much weight.  Indeed she has increased 3 pounds since last visit 3 months ago. Also complained left lower extremity swelling, intermittent She reports that decreased appetite has never recovered ever since radiation and chemotherapy. Some shortness of breath, no exacerbating or alleviating factors.  Denies any chest pain, palpitation, abdominal pain.  No nausea vomiting, unintentional weight loss, fever or chills.  Review of Systems  Constitutional: Positive for appetite change and fatigue. Negative for chills and fever.  HENT:   Negative for hearing loss and voice change.   Eyes: Negative for eye problems.  Respiratory: Negative for chest tightness and cough.   Cardiovascular: Negative for chest pain.  Gastrointestinal: Positive for nausea. Negative for abdominal distention, abdominal pain, blood in stool and vomiting.  Endocrine: Negative for hot flashes.  Genitourinary: Negative for difficulty urinating and frequency.   Musculoskeletal: Negative for arthralgias.  Skin: Negative for itching and rash.  Neurological: Negative for extremity weakness.  Hematological: Negative for adenopathy.  Psychiatric/Behavioral: Negative for confusion.    MEDICAL HISTORY:  Past Medical History:  Diagnosis Date  . Cancer ([place])   . COVID-19 10/2018  . Nausea with  vomiting 10/31/2019  .  No pertinent past medical history     SURGICAL HISTORY: Past Surgical History:  Procedure Laterality Date  . no surgical history      SOCIAL HISTORY: Social History   Socioeconomic History  . Marital status: Married    Spouse name: Not on file  . Number of children: Not on file  . Years of education: Not on file  . Highest education level: Not on file  Occupational History  . Not on file  Tobacco Use  . Smoking status: Never Smoker  . Smokeless tobacco: Never Used  Vaping Use  . Vaping Use: Never used  Substance and Sexual Activity  . Alcohol use: Never  . Drug use: Never  . Sexual activity: Yes    Birth control/protection: None  Other Topics Concern  . Not on file  Social History Narrative  . Not on file   Social Determinants of Health   Financial Resource Strain:   . Difficulty of Paying Living Expenses: Not on file  Food Insecurity:   . Worried About Charity fundraiser in the Last Year: Not on file  . Ran Out of Food in the Last Year: Not on file  Transportation Needs:   . Lack of Transportation (Medical): Not on file  . Lack of Transportation (Non-Medical): Not on file  Physical Activity:   . Days of Exercise per Week: Not on file  . Minutes of Exercise per Session: Not on file  Stress:   . Feeling of Stress : Not on file  Social Connections:   . Frequency of Communication with Friends and Family: Not on file  . Frequency of Social Gatherings with Friends and Family: Not on file  . Attends Religious Services: Not on file  . Active Member of Clubs or Organizations: Not on file  . Attends Archivist Meetings: Not on file  . Marital Status: Not on file  Intimate Partner Violence:   . Fear of Current or Ex-Partner: Not on file  . Emotionally Abused: Not on file  . Physically Abused: Not on file  . Sexually Abused: Not on file    FAMILY HISTORY: Family History  Problem Relation Age of Onset  . Blindness Mother   .  Glaucoma Mother   . Diabetes Father   . Diabetes Brother     ALLERGIES:  has No Known Allergies.  MEDICATIONS:  Current Outpatient Medications  Medication Sig Dispense Refill  . ciprofloxacin (CIPRO) 500 MG tablet Take 1 tablet (500 mg total) by mouth 2 (two) times daily. (Patient not taking: Reported on 11/07/2019) 10 tablet 0  . dexamethasone (DECADRON) 4 MG tablet Take 2 tablets (8 mg total) by mouth See admin instructions. Take 8mg  daily for 2 days after each chemotherapy-cisplatin treatments. (Patient not taking: Reported on 11/07/2019) 24 tablet 0  . nystatin (MYCOSTATIN) 100000 UNIT/ML suspension Use as directed 5 mLs (500,000 Units total) in the mouth or throat 4 (four) times daily. (Patient not taking: Reported on 12/11/2019) 120 mL 0  . OLANZapine (ZYPREXA) 10 MG tablet Take 1 tablet (10 mg total) by mouth at bedtime. (Patient not taking: Reported on 02/12/2020) 30 tablet 0  . omeprazole (PRILOSEC) 20 MG capsule Take 1 capsule (20 mg total) by mouth daily. (Patient not taking: Reported on 02/12/2020) 30 capsule 1  . ondansetron (ZOFRAN ODT) 4 MG disintegrating tablet Take 1 tablet (4 mg total) by mouth every 8 (eight) hours as needed. (Patient not taking: Reported on 02/12/2020) 20 tablet 0  . prochlorperazine (  COMPAZINE) 10 MG tablet Take 1 tablet (10 mg total) by mouth every 6 (six) hours as needed (Nausea or vomiting). (Patient not taking: Reported on 02/12/2020) 30 tablet 1  . promethazine (PHENERGAN) 25 MG suppository Place 1 suppository (25 mg total) rectally every 6 (six) hours as needed for nausea, vomiting or refractory nausea / vomiting. (Patient not taking: Reported on 02/12/2020) 12 each 1  . senna (SENOKOT) 8.6 MG TABS tablet Take 2 tablets (17.2 mg total) by mouth daily. (Patient not taking: Reported on 02/12/2020) 120 tablet 0  . traMADol (ULTRAM) 50 MG tablet Take 1 tablet (50 mg total) by mouth every 6 (six) hours as needed. (Patient not taking: Reported on 02/12/2020) 12  tablet 0   No current facility-administered medications for this visit.     PHYSICAL EXAMINATION: ECOG PERFORMANCE STATUS: 1 - Symptomatic but completely ambulatory Vitals:   02/12/20 0858  BP: 136/81  Pulse: 78  Resp: 16  Temp: 98.1 F (36.7 C)  SpO2: 97%   Filed Weights   02/12/20 0858  Weight: 186 lb 4.8 oz (84.5 kg)    Physical Exam Constitutional:      General: She is not in acute distress. HENT:     Head: Normocephalic and atraumatic.  Eyes:     General: No scleral icterus. Cardiovascular:     Rate and Rhythm: Normal rate and regular rhythm.     Heart sounds: Normal heart sounds.  Pulmonary:     Effort: Pulmonary effort is normal. No respiratory distress.     Breath sounds: No wheezing.  Abdominal:     General: Bowel sounds are normal. There is no distension.     Palpations: Abdomen is soft.  Musculoskeletal:        General: No deformity. Normal range of motion.     Cervical back: Normal range of motion and neck supple.  Skin:    General: Skin is warm and dry.     Findings: No erythema or rash.  Neurological:     Mental Status: She is alert and oriented to person, place, and time. Mental status is at baseline.     Cranial Nerves: No cranial nerve deficit.     Coordination: Coordination normal.  Psychiatric:        Mood and Affect: Mood normal.     LABORATORY DATA:  I have reviewed the data as listed Lab Results  Component Value Date   WBC 4.9 01/29/2020   HGB 12.5 01/29/2020   HCT 33.5 (L) 01/29/2020   MCV 98.8 01/29/2020   PLT 298 01/29/2020   Recent Labs    11/02/19 0803 11/07/19 1054 01/29/20 1738  NA 139 138 138  K 3.4* 3.9 3.6  CL 106 103 103  CO2 24 28 25   GLUCOSE 139* 155* 101*  BUN 19 24* 10  CREATININE 0.58 0.80 0.57  CALCIUM 8.3* 8.5* 9.4  GFRNONAA >60 >60 >60  GFRAA >60 >60 >60  PROT 6.6 6.7 7.9  ALBUMIN 3.6 3.6 3.9  AST 21 33 38  ALT 29 71* 27  ALKPHOS 76 76 73  BILITOT 0.4 0.5 0.6   Iron/TIBC/Ferritin/ %Sat No  results found for: IRON, TIBC, FERRITIN, IRONPCTSAT    RADIOGRAPHIC STUDIES: I have personally reviewed the radiological images as listed and agreed with the findings in the report. NM PET Image Restag (PS) Skull Base To Thigh  Result Date: 02/06/2020 CLINICAL DATA:  Subsequent treatment strategy for uterine cervix cancer completed vaginal brachytherapy in August 2021 with most recent  chemotherapy July 2021. EXAM: NUCLEAR MEDICINE PET SKULL BASE TO THIGH TECHNIQUE: 9.7 mCi F-18 FDG was injected intravenously. Full-ring PET imaging was performed from the skull base to thigh after the radiotracer. CT data was obtained and used for attenuation correction and anatomic localization. Fasting blood glucose: 97 mg/dl COMPARISON:  11/08/2019 PET-CT. FINDINGS: Mediastinal blood pool activity: SUV max 2.8 Liver activity: SUV max NA NECK: No hypermetabolic lymph nodes in the neck. Incidental CT findings: none CHEST: Recurrent hypermetabolism within 0.7 cm high left mediastinal lymph node with max SUV 5.4 (series 3/image 58), previously 0.5 cm with max SUV 2.9, increased in size and metabolism. Recurrent hypermetabolism within 0.6 cm left prevascular mediastinal lymph node with max SUV 4.0 (series 3/image 65), previously 0.3 cm with max SUV 1.7, increased in size and metabolism. No enlarged or hypermetabolic axillary or hilar lymph nodes. No hypermetabolic pulmonary findings. Two new tiny solid left lower lobe pulmonary nodules, largest 0.3 cm (series 3/image 79), below PET resolution. Persistent mild hypermetabolism in the lower thoracic esophagus without discrete mass correlate on the noncontrast CT images. Incidental CT findings: Atherosclerotic nonaneurysmal thoracic aorta. No acute consolidative airspace disease. ABDOMEN/PELVIS: No abnormal hypermetabolic activity within the liver, pancreas, adrenal glands, or spleen. No enlarged or hypermetabolic lymph nodes in the abdomen or pelvis. Mild hypermetabolism within the  uterine cervix with max SUV 4.2, previous max SUV 4.8, slightly decreased. Incidental CT findings: Diffuse hepatic steatosis.  Cholelithiasis. SKELETON: No focal hypermetabolic activity to suggest skeletal metastasis. Incidental CT findings: none IMPRESSION: 1. Recurrent hypermetabolic nodal metastases in the high left mediastinum. 2. No additional sites of recurrent hypermetabolic metastatic disease. 3. Residual mild hypermetabolism within the uterine cervix, slightly decreased. 4. Two new tiny solid left lower lobe pulmonary nodules, largest 0.3 cm, below PET resolution. Tiny pulmonary metastases not excluded. Follow-up chest CT recommended in 3 months. 5. Nonspecific persistent mild hypermetabolism in the lower thoracic esophagus without discrete mass correlate on the noncontrast CT images. Upper endoscopic correlation may be considered as clinically warranted. 6. Chronic findings include: Aortic Atherosclerosis (ICD10-I70.0). Diffuse hepatic steatosis. Cholelithiasis. Electronically Signed   By: Ilona Sorrel M.D.   On: 02/06/2020 17:36   US Venous Img Lower Unilateral Left  Result Date: 02/12/2020 CLINICAL DATA:  LEFT leg swelling for at least a month EXAM: LEFT LOWER EXTREMITY VENOUS DOPPLER ULTRASOUND TECHNIQUE: Gray-scale sonography with compression, as well as color and duplex ultrasound, were performed to evaluate the deep venous system(s) from the level of the common femoral vein through the popliteal and proximal calf veins. COMPARISON:  None FINDINGS: VENOUS Normal compressibility of the common femoral, superficial femoral, and popliteal veins, as well as the visualized calf veins. Visualized portions of profunda femoral vein and great saphenous vein unremarkable. No filling defects to suggest DVT on grayscale or color Doppler imaging. Doppler waveforms show normal direction of venous flow, normal respiratory plasticity and response to augmentation. Limited views of the contralateral common femoral  vein are unremarkable. OTHER None. Limitations: none IMPRESSION: No evidence of deep venous thrombosis in the LEFT lower extremity. Electronically Signed   By: Lavonia Dana M.D.   On: 02/12/2020 15:16   DG Chest Portable 1 View  Result Date: 01/30/2020 CLINICAL DATA:  Viral syndrome. EXAM: PORTABLE CHEST 1 VIEW COMPARISON:  November 08, 2019 FINDINGS: There are few small linear airspace opacities at the lung bases bilaterally. These are favored to represent areas of atelectasis as seen on the patient's prior PET-CT. The heart size is unremarkable. There is no  pneumothorax or large pleural effusion. There is mild elevation of the left hemidiaphragm. IMPRESSION: No active disease. Electronically Signed   By: Constance Holster M.D.   On: 01/30/2020 03:07      ASSESSMENT & PLAN:  1. 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 ([place])   2. Right leg swelling   3. Non-intractable vomiting with nausea, unspecified vomiting type   4. Thoracic lymphadenopathy   5. Abnormal PET scan of colon    # At least Stage IIIc Cervix cancer, with small hypermetabolic lymph nodes in the left superior mediastinum and left supraclavicular region which could put her to stage IV disease.  These hypermetabolic activity has improved after concurrent chemoradiation.  02/06/2020 repeat PET scan showed recurrent hypermetabolic left mediastinum lymph node.  No additional sites of recurrent hypermetabolic metastasis.  Residual mild hypermetabolism within the uterine cervix, slightly decreased.  2 small lung nodules with activity below PET resolution. Nonspecific persistent mild hypermetabolic 7 in the lower thoracic esophageal without discrete mass.  Diffuse hepatic steatosis, cholelithiasis atherosclerosis  Images were independently reviewed and discussed with patient and her daughter Possibilities of service cancer recurrence were discussed with patient.  I recommend patient to establish care with gastroenterology for EGD  for direct visualization of the mild hypermetabolic lower thoracic esophagus to rule out esophageal etiology.  Also discussed with pulmonology to see if it is feasible to obtain a paratracheal biopsy.  Patient agrees with the plan.  #Nausea, pending GI work-up. #Left lower extremity swelling, intermittent.  I will obtain stat left lower extremity ultrasound to rule out DVT.  Results for reviewed.  Negative for DVT.  We spent sufficient time to discuss many aspect of care, questions were answered to patient's satisfaction.   All questions were answered. The patient knows to call the clinic with any problems questions or concerns.  Return of visit: To be determined.   Earlie Server, MD, PhD Hematology Oncology [facility] Pager- [phone] 02/12/2020

## 2020-02-12 NOTE — Progress Notes (Signed)
Patient reports she is not taking any medications.  Does have nausea but does not take any medications.  Also has new SOBr.

## 2020-02-16 ENCOUNTER — Telehealth: Payer: Self-pay | Admitting: *Deleted

## 2020-02-16 NOTE — Telephone Encounter (Signed)
[name] called reporting that GI set her appointment up for December and she is asking if this is acceptable and if patient should be seen by someone else in a different practice that can get her in sooner. Please advise

## 2020-02-19 NOTE — Telephone Encounter (Signed)
Is the appt with Dr. Vicente Males on 04/10/20 OK or do you thing she needs to be seen sooner?

## 2020-02-19 NOTE — Telephone Encounter (Signed)
Heather, I called the daughter to give her options of either continue monitor and repeat PET in 3 months, vs proceed with EUS guided biopsy of the lymph nodes now-the yield is not high, in other word, ok to try, but may or may not be able to get adequate sample.   I think both options are reasonable.  (this is a different procedure than her EGD which will be done by GI, I think December is ok if she choose to wait anyway).  Daughter does not have time to talk to me right now. Would you please give her a call to let her know with above. I am happy to see pt again if she wants more detailed discussion. Thanks.

## 2020-02-20 NOTE — Telephone Encounter (Signed)
Spoke to patient and she requested I contact her daughter, [name]. Contacted Daisy and discussed both options with her and she would like for her mother to proceed with EUS guided biospy "just in case it does show something." She will discuss and explain this to Ms. Sosa.  Informed her that it is ok to keep appt for GI in December per MD.

## 2020-02-21 ENCOUNTER — Telehealth: Payer: Self-pay

## 2020-02-21 ENCOUNTER — Other Ambulatory Visit: Payer: Self-pay

## 2020-02-21 ENCOUNTER — Encounter: Payer: Self-pay | Admitting: General Practice

## 2020-02-21 DIAGNOSIS — R590 Localized enlarged lymph nodes: Secondary | ICD-10-CM

## 2020-02-21 NOTE — Telephone Encounter (Signed)
----- Message from Milus Banister, MD sent at 02/21/2020 10:33 AM EDT ----- Regarding: RE: ? EUS Jill Rodriguez, She needs first available EUS with either GM or DJ. For mediastinal adenopathy.  Thanks  Kristi,  She does not need a referral for an EGD by local GI, we will be able to examine her esophagus during EUS.   Thanks ALL.  ----- Message ----- From: Timothy Lasso, RN Sent: 02/21/2020  10:03 AM EDT To: Milus Banister, MD, # Subject: FW: ? EUS                                       ----- Message ----- From: Clent Jacks, RN Sent: 02/21/2020   9:47 AM EDT To: Milus Banister, MD, Timothy Lasso, RN, # Subject: RE: ? EUS                                      Hi she would like to proceed with EUS. She had also been referred to a local GI for an endo to take a look at the esophagus for the nonspecific persistent mild hypermetabolism in the lower thoracic esophagus without discrete mass on her PET. If she could have that done at the same time it would be great. They could not get her in until December.  Jill Rodriguez she understands a different dialect of Spanish and does not understand everything our interpreters say. We have consent on file to call her daughter, [name], for everything. She does her interpretation. We usually get an interpreter just in case. Thanks. ----- Message ----- From: Earlie Server, MD Sent: 02/19/2020   8:09 AM EDT To: Milus Banister, MD, Timothy Lasso, RN, # Subject: RE: ? EUS                                      Thank you all. I will further discuss with patient and provide her both options of either proceeding with biopsy vs observance. Steffanie Dunn will help me to reach out if patient decides to proceed with biopsy.  Thank you Zhou ----- Message ----- From: Milus Banister, MD Sent: 02/19/2020   5:21 AM EDT To: Timothy Lasso, RN, Clent Jacks, RN, # Subject: RE: ? EUS                                      I agree with GM's assessment  ----- Message  ----- From: Irving Copas., MD Sent: 02/18/2020  10:12 PM EDT To: Milus Banister, MD, Timothy Lasso, RN, # Subject: RE: ? EUS                                      Kristi,Thanks for reaching out.These lymph nodes are quite small.With that being said, the first lymph node mentioned is in a region that is too far away from the esophagus for safe biopsy (it is actually at the arch of the aorta).The second lymph node is in a region that we may be able to visualize and access for  a potential biopsy.   Usually, lesions that are less than 1 cm are quite hard to stabilize and sample but if it will change the way in which this patient's oncology treatment is moving then we certainly are happy to be able to look and see if we can sample that second lymph node.It will not be straightforward but certainly we can be available to try and help with that.If you and Dr. Tasia Catchings, want [place] to move forward with this then please let us know and RN Gerarda Fraction will work on trying to get this scheduled with DJ or myself.Thanks.GM ----- Message ----- From: Clent Jacks, RN Sent: 02/16/2020   9:14 AM EDT To: Irving Copas., MD Subject: ? EUS                                          Hi Dr. Rush Landmark, we have this patient with cervical cancer that has completed her chemorads. In the beginning she had some mediastinal lymph nodes that lit up on PET. They resolved and are now back. Pulmonary does not think they can safely biopsy these. Do you think you can reach them with EUS?  If so, I would like to get her arranged for that with you or Dr. Ardis Hughs. If not, we are going to repeat PET in 2-3 months. Thanks.  Kristi

## 2020-02-21 NOTE — Telephone Encounter (Signed)
EUS has been scheduled for 03/28/20 with Dr. Ardis Hughs. This will also cover EGD needed for PET findings in esophagus. Called and cancelled appointment with [facility].

## 2020-02-21 NOTE — Telephone Encounter (Signed)
EUS scheduled for 03/28/20 at [facility] with Dr Ardis Hughs at 830 am.  COVID test on 03/25/20 at 910 am.  Left message on machine to call back

## 2020-02-21 NOTE — Telephone Encounter (Signed)
Jill Rodriguez will work on scheduing EUS. (see phone note from [employer], LPN on 72/25)

## 2020-02-21 NOTE — Telephone Encounter (Signed)
I spoke with [name] the pt daughter and we discussed all instructions.  All her questions were answered and information sent to My Chart per th ept daughter request.  She will call back if she has any further questions or concerns.

## 2020-02-29 ENCOUNTER — Encounter (HOSPITAL_COMMUNITY): Payer: Self-pay

## 2020-03-01 ENCOUNTER — Telehealth: Payer: Self-pay | Admitting: *Deleted

## 2020-03-01 NOTE — Telephone Encounter (Signed)
Daughter called asking for a return call to discuss that patient is still unable to work and that patient employer will be faxing over some paperwork for you [phone]

## 2020-03-01 NOTE — Telephone Encounter (Signed)
Spoke to patient's daughter, [name], who states that Ms. Sosa still feels nauseated, weak and has no appetite and feels like she cannot return back to work at this time. Daisy spoke to patient's employer and they are ok with this as long as the appropriate paperwork is submitted. Since patient is still going through other oncology related procedures Dr.Yu is ok with pt being out for an additional 4 months. Will work [employer] research (life sciences) form and fax once it is completed.

## 2020-03-05 ENCOUNTER — Telehealth: Payer: Self-pay

## 2020-03-05 NOTE — Telephone Encounter (Signed)
FMLA forms faxed to [employer] on 11/8.

## 2020-03-05 NOTE — Telephone Encounter (Signed)
Called and reiterated with daughter, Jill Rodriguez, that the EUS she is scheduled for on 03/28/20 at [name] in [place] will also cover the endoscopy that she was sent to [place] for. She does not need to go to [facility] at this time. Also reviewed that the instructions for her EUS were sent to her MyChart on 02/21/20. Encouraged her to review these in advance to make sure she has a good understanding. She verbalized understanding.

## 2020-03-18 NOTE — Progress Notes (Signed)
Attempted to obtain medical history via telephone, unable to reach at this time. I left a voicemail to return pre surgical testing department's phone call.  

## 2020-03-25 ENCOUNTER — Encounter (HOSPITAL_COMMUNITY): Payer: Self-pay

## 2020-03-25 ENCOUNTER — Other Ambulatory Visit (HOSPITAL_COMMUNITY)
Admission: RE | Admit: 2020-03-25 | Discharge: 2020-03-25 | Disposition: A | Discharge status: Home / Self Care | Payer: BC Managed Care – PPO | Source: Ambulatory Visit | Attending: Gastroenterology | Admitting: Gastroenterology

## 2020-03-25 ENCOUNTER — Other Ambulatory Visit: Payer: Self-pay

## 2020-03-25 DIAGNOSIS — Z01812 Encounter for preprocedural laboratory examination: Secondary | ICD-10-CM | POA: Insufficient documentation

## 2020-03-25 DIAGNOSIS — C539 Malignant neoplasm of cervix uteri, unspecified: Secondary | ICD-10-CM | POA: Diagnosis not present

## 2020-03-25 DIAGNOSIS — R590 Localized enlarged lymph nodes: Secondary | ICD-10-CM | POA: Diagnosis not present

## 2020-03-25 DIAGNOSIS — Z20822 Contact with and (suspected) exposure to covid-19: Secondary | ICD-10-CM | POA: Insufficient documentation

## 2020-03-25 DIAGNOSIS — R933 Abnormal findings on diagnostic imaging of other parts of digestive tract: Secondary | ICD-10-CM | POA: Diagnosis present

## 2020-03-25 DIAGNOSIS — Z923 Personal history of irradiation: Secondary | ICD-10-CM | POA: Diagnosis not present

## 2020-03-25 DIAGNOSIS — Z8616 Personal history of COVID-19: Secondary | ICD-10-CM | POA: Diagnosis not present

## 2020-03-25 DIAGNOSIS — Z9221 Personal history of antineoplastic chemotherapy: Secondary | ICD-10-CM | POA: Diagnosis not present

## 2020-03-25 LAB — SARS CORONAVIRUS 2 (TAT 6-24 HRS): SARS Coronavirus 2: NEGATIVE

## 2020-03-27 NOTE — Anesthesia Preprocedure Evaluation (Addendum)
Anesthesia Evaluation  Patient identified by MRN, date of birth, ID band Patient awake    Reviewed: Allergy & Precautions, NPO status , Patient's Chart, lab work & pertinent test results  Airway Mallampati: II  TM Distance: >3 FB Neck ROM: Full    Dental no notable dental hx. (+) Teeth Intact, Dental Advisory Given   Pulmonary neg pulmonary ROS,  Ptcan lie flat   Pulmonary exam normal breath sounds clear to auscultation       Cardiovascular Exercise Tolerance: Good negative cardio ROS Normal cardiovascular exam Rhythm:Regular Rate:Normal     Neuro/Psych negative neurological ROS  negative psych ROS   GI/Hepatic negative GI ROS, Neg liver ROS,   Endo/Other  negative endocrine ROS  Renal/GU negative Renal ROS  Female GU complaint Stage 3 cervical CA    Musculoskeletal negative musculoskeletal ROS (+)   Abdominal (+) + obese,   Peds  Hematology negative hematology ROS (+)   Anesthesia Other Findings   Reproductive/Obstetrics                            Anesthesia Physical Anesthesia Plan  ASA: III  Anesthesia Plan: General   Post-op Pain Management:    Induction: Intravenous  PONV Risk Score and Plan: 2 and Treatment may vary due to age or medical condition  Airway Management Planned: Oral ETT  Additional Equipment:   Intra-op Plan:   Post-operative Plan: Extubation in OR  Informed Consent: I have reviewed the patients History and Physical, chart, labs and discussed the procedure including the risks, benefits and alternatives for the proposed anesthesia with the patient or authorized representative who has indicated his/her understanding and acceptance.     Interpreter used for [employer] and Engineer, production given  Plan Discussed with: CRNA and Anesthesiologist  Anesthesia Plan Comments:        Anesthesia Quick Evaluation

## 2020-03-28 ENCOUNTER — Encounter (HOSPITAL_COMMUNITY): Payer: Self-pay | Admitting: Gastroenterology

## 2020-03-28 ENCOUNTER — Ambulatory Visit (HOSPITAL_COMMUNITY)
Admission: RE | Admit: 2020-03-28 | Discharge: 2020-03-28 | Disposition: A | Discharge status: Home / Self Care | Payer: BC Managed Care – PPO | Attending: Gastroenterology | Admitting: Gastroenterology

## 2020-03-28 ENCOUNTER — Other Ambulatory Visit: Payer: Self-pay

## 2020-03-28 ENCOUNTER — Encounter (HOSPITAL_COMMUNITY)
Admission: RE | Disposition: A | Discharge status: Home / Self Care | Payer: Self-pay | Source: Home / Self Care | Attending: Gastroenterology

## 2020-03-28 ENCOUNTER — Telehealth: Payer: Self-pay

## 2020-03-28 ENCOUNTER — Ambulatory Visit (HOSPITAL_COMMUNITY): Payer: BC Managed Care – PPO | Admitting: Anesthesiology

## 2020-03-28 DIAGNOSIS — R933 Abnormal findings on diagnostic imaging of other parts of digestive tract: Secondary | ICD-10-CM | POA: Insufficient documentation

## 2020-03-28 DIAGNOSIS — Z8616 Personal history of COVID-19: Secondary | ICD-10-CM | POA: Insufficient documentation

## 2020-03-28 DIAGNOSIS — Z20822 Contact with and (suspected) exposure to covid-19: Secondary | ICD-10-CM | POA: Insufficient documentation

## 2020-03-28 DIAGNOSIS — Z01812 Encounter for preprocedural laboratory examination: Secondary | ICD-10-CM | POA: Insufficient documentation

## 2020-03-28 DIAGNOSIS — R935 Abnormal findings on diagnostic imaging of other abdominal regions, including retroperitoneum: Secondary | ICD-10-CM | POA: Diagnosis not present

## 2020-03-28 DIAGNOSIS — C539 Malignant neoplasm of cervix uteri, unspecified: Secondary | ICD-10-CM | POA: Insufficient documentation

## 2020-03-28 DIAGNOSIS — Z9221 Personal history of antineoplastic chemotherapy: Secondary | ICD-10-CM | POA: Insufficient documentation

## 2020-03-28 DIAGNOSIS — R590 Localized enlarged lymph nodes: Secondary | ICD-10-CM | POA: Insufficient documentation

## 2020-03-28 DIAGNOSIS — Z923 Personal history of irradiation: Secondary | ICD-10-CM | POA: Insufficient documentation

## 2020-03-28 HISTORY — PX: ESOPHAGOGASTRODUODENOSCOPY (EGD) WITH PROPOFOL: SHX5813

## 2020-03-28 HISTORY — PX: EUS: SHX5427

## 2020-03-28 SURGERY — UPPER ENDOSCOPIC ULTRASOUND (EUS) RADIAL
Anesthesia: General

## 2020-03-28 MED ORDER — MIDAZOLAM HCL 5 MG/5ML IJ SOLN
INTRAMUSCULAR | Status: DC | PRN
  Administered 2020-03-28: 2 mg via INTRAVENOUS

## 2020-03-28 MED ORDER — LIDOCAINE 2% (20 MG/ML) 5 ML SYRINGE
INTRAMUSCULAR | Status: DC | PRN
  Administered 2020-03-28: 100 mg via INTRAVENOUS

## 2020-03-28 MED ORDER — SUGAMMADEX SODIUM 200 MG/2ML IV SOLN
INTRAVENOUS | Status: DC | PRN
  Administered 2020-03-28: 300 mg via INTRAVENOUS

## 2020-03-28 MED ORDER — FENTANYL CITRATE (PF) 100 MCG/2ML IJ SOLN
INTRAMUSCULAR | Status: DC | PRN
  Administered 2020-03-28 (×3): 50 ug via INTRAVENOUS

## 2020-03-28 MED ORDER — ONDANSETRON HCL 4 MG/2ML IJ SOLN
INTRAMUSCULAR | Status: DC | PRN
  Administered 2020-03-28: 4 mg via INTRAVENOUS

## 2020-03-28 MED ORDER — FENTANYL CITRATE (PF) 100 MCG/2ML IJ SOLN
INTRAMUSCULAR | Status: AC
  Filled 2020-03-28: qty 2

## 2020-03-28 MED ORDER — MIDAZOLAM HCL 2 MG/2ML IJ SOLN
INTRAMUSCULAR | Status: AC
  Filled 2020-03-28: qty 2

## 2020-03-28 MED ORDER — LACTATED RINGERS IV SOLN
INTRAVENOUS | Status: DC
  Administered 2020-03-28: 1000 mL via INTRAVENOUS

## 2020-03-28 MED ORDER — PROPOFOL 10 MG/ML IV BOLUS
INTRAVENOUS | Status: DC | PRN
  Administered 2020-03-28: 140 mg via INTRAVENOUS

## 2020-03-28 MED ORDER — SODIUM CHLORIDE 0.9 % IV SOLN: INTRAVENOUS | Status: DC

## 2020-03-28 MED ORDER — ROCURONIUM BROMIDE 10 MG/ML (PF) SYRINGE
PREFILLED_SYRINGE | INTRAVENOUS | Status: DC | PRN
  Administered 2020-03-28: 50 mg via INTRAVENOUS

## 2020-03-28 MED ORDER — DEXAMETHASONE SODIUM PHOSPHATE 10 MG/ML IJ SOLN
INTRAMUSCULAR | Status: DC | PRN
  Administered 2020-03-28: 5 mg via INTRAVENOUS

## 2020-03-28 NOTE — H&P (Signed)
HPI: This is a woman with cervical cancer, s/p chemo/XRT.  Now with PET avid distal esophagus and also PET avid mediastinal adenopathy    ROS: complete GI ROS as described in HPI, all other review negative.  Constitutional:  No unintentional weight loss   Past Medical History:  Diagnosis Date  . Cancer ([place])   . COVID-19 10/2018  . Nausea with vomiting 10/31/2019  . No pertinent past medical history     Past Surgical History:  Procedure Laterality Date  . no surgical history      Current Facility-Administered Medications  Medication Dose Route Frequency Provider Last Rate Last Admin  . 0.9 %  sodium chloride infusion   Intravenous Continuous Milus Banister, MD      . lactated ringers infusion   Intravenous Continuous Milus Banister, MD        Allergies as of 02/21/2020  . (No Known Allergies)    Family History  Problem Relation Age of Onset  . Blindness Mother   . Glaucoma Mother   . Diabetes Father   . Diabetes Brother     Social History   Socioeconomic History  . Marital status: Married    Spouse name: Not on file  . Number of children: Not on file  . Years of education: Not on file  . Highest education level: Not on file  Occupational History  . Not on file  Tobacco Use  . Smoking status: Never Smoker  . Smokeless tobacco: Never Used  Vaping Use  . Vaping Use: Never used  Substance and Sexual Activity  . Alcohol use: Never  . Drug use: Never  . Sexual activity: Yes    Birth control/protection: None  Other Topics Concern  . Not on file  Social History Narrative  . Not on file   Social Determinants of Health   Financial Resource Strain:   . Difficulty of Paying Living Expenses: Not on file  Food Insecurity:   . Worried About Charity fundraiser in the Last Year: Not on file  . Ran Out of Food in the Last Year: Not on file  Transportation Needs:   . Lack of Transportation (Medical): Not on file  . Lack of Transportation (Non-Medical): Not  on file  Physical Activity:   . Days of Exercise per Week: Not on file  . Minutes of Exercise per Session: Not on file  Stress:   . Feeling of Stress : Not on file  Social Connections:   . Frequency of Communication with Friends and Family: Not on file  . Frequency of Social Gatherings with Friends and Family: Not on file  . Attends Religious Services: Not on file  . Active Member of Clubs or Organizations: Not on file  . Attends Archivist Meetings: Not on file  . Marital Status: Not on file  Intimate Partner Violence:   . Fear of Current or Ex-Partner: Not on file  . Emotionally Abused: Not on file  . Physically Abused: Not on file  . Sexually Abused: Not on file     Physical Exam: Ht 5\' 1"  (1.549 m)   Wt 81.6 kg   BMI 34.01 kg/m  Constitutional: generally well-appearing Psychiatric: alert and oriented x3 Abdomen: soft, nontender, nondistended, no obvious ascites, no peritoneal signs, normal bowel sounds No peripheral edema noted in lower extremities  Assessment and plan: 59 y.o. female with cervical cancer, PET avid mediastinal adenopathy and PET avid distal esophagus  For upper EUS evaluation this  morning.  Please see the "Patient Instructions" section for addition details about the plan.  Owens Loffler, MD [facility] 03/28/2020, 7:42 AM

## 2020-03-28 NOTE — Telephone Encounter (Signed)
EUS was performed today. Follow up with Dr. Tasia Catchings was to be determined. Notified her team regarding EUS completion.

## 2020-03-28 NOTE — Anesthesia Procedure Notes (Signed)
Procedure Name: Intubation Performed by: Gean Maidens, CRNA Pre-anesthesia Checklist: Patient identified, Emergency Drugs available, Suction available, Patient being monitored and Timeout performed Patient Re-evaluated:Patient Re-evaluated prior to induction Oxygen Delivery Method: Circle system utilized Preoxygenation: Pre-oxygenation with 100% oxygen Induction Type: IV induction Ventilation: Mask ventilation without difficulty Laryngoscope Size: Mac and 4 Grade View: Grade I Tube type: Oral Tube size: 7.0 mm Number of attempts: 1 Airway Equipment and Method: Stylet Placement Confirmation: ETT inserted through vocal cords under direct vision,  positive ETCO2 and breath sounds checked- equal and bilateral Secured at: 21 cm Tube secured with: Tape Dental Injury: Teeth and Oropharynx as per pre-operative assessment

## 2020-03-28 NOTE — Anesthesia Postprocedure Evaluation (Signed)
Anesthesia Post Note  Patient: Jill Rodriguez  Procedure(s) Performed: UPPER ENDOSCOPIC ULTRASOUND (EUS) RADIAL (N/A )     Patient location during evaluation: Endoscopy Anesthesia Type: General Level of consciousness: awake and alert Pain management: pain level controlled Vital Signs Assessment: post-procedure vital signs reviewed and stable Respiratory status: spontaneous breathing, nonlabored ventilation, respiratory function stable and patient connected to nasal cannula oxygen Cardiovascular status: blood pressure returned to baseline and stable Postop Assessment: no apparent nausea or vomiting Anesthetic complications: no   No complications documented.  Last Vitals:  Vitals:   03/28/20 1030 03/28/20 1040  BP: (!) 151/73 (!) 156/80  Pulse: 79 75  Resp: 14 13  Temp:    SpO2: 99% 95%    Last Pain:  Vitals:   03/28/20 1012  PainSc: 0-No pain                 Barnet Glasgow

## 2020-03-28 NOTE — Op Note (Signed)
[facility] Patient Name: Jill Rodriguez Procedure Date: 03/28/2020 MRN: [id number] Attending MD: Milus Banister , MD Date of Birth: [birth year] CSN: [id number] Age: 59 Admit Type: Outpatient Procedure:                Upper EUS Indications:              Cervical cancer with abnormal PET of GE junction                            and mediastinum Providers:                Milus Banister, MD, Cleda Daub, RN, Elspeth Cho Tech., Technician, Herbie Drape, CRNA Referring MD:             Earlie Server, MD Medicines:                General Anesthesia Complications:            No immediate complications. Estimated blood loss:                            None. Estimated Blood Loss:     Estimated blood loss: none. Procedure:                Pre-Anesthesia Assessment:                           - Prior to the procedure, a History and Physical                            was performed, and patient medications and                            allergies were reviewed. The patient's tolerance of                            previous anesthesia was also reviewed. The risks                            and benefits of the procedure and the sedation                            options and risks were discussed with the patient.                            All questions were answered, and informed consent                            was obtained. Prior Anticoagulants: The patient has                            taken no previous anticoagulant or antiplatelet  agents. ASA Grade Assessment: II - A patient with                            mild systemic disease. After reviewing the risks                            and benefits, the patient was deemed in                            satisfactory condition to undergo the procedure.                           After obtaining informed consent, the endoscope was                            passed under  direct vision. Throughout the                            procedure, the patient's blood pressure, pulse, and                            oxygen saturations were monitored continuously. The                            [id number]-[id number] ([id number]) Olympus Radial EUS was                            introduced through the mouth, and advanced to the                            body of the stomach. The [id number] ([id number])                            Olympus Linear EUS was introduced through the                            mouth, and advanced to the body of the stomach. The                            upper EUS was accomplished without difficulty. The                            patient tolerated the procedure well. Scope In: Scope Out: Findings:      ENDOSCOPIC FINDING: :      The examined esophagus was endoscopically normal.      The entire examined stomach was endoscopically normal. GE junction was       normal.      ENDOSONOGRAPHIC FINDING: :      1. The high mediastinal PET avid LN was visible with linear the linear       echoendoscope. It was oblong, 12.36mm, hypoechoic, homogeneous (suspcious       for malignant involvement). This was visible just proximal to the vocal       cords and there were overlying small but pulsating (  arterial) blood       vessels between the scope tip and the LN and I did not feel needle       aspiration was safe.      2. GE junction was normal. No masses or tumors.      3. Limited views of the pancreas, spleen, liver were all normal. Impression:               - The GE junction was normal endoscopically and via                            EUS.                           - The high mediastinal PET avid LN was visible with                            linear the linear echoendoscope. It was oblong,                            12.13mm, hypoechoic, homogeneous (suspcious for                            malignant involvement). This was visible just                            proximal  to the vocal cords and there were                            overlying small but pulsating (arterial) blood                            vessels between the scope tip and the LN and I did                            not feel needle aspiration was safe. Moderate Sedation:      Not Applicable - Patient had care per Anesthesia. Recommendation:           - Discharge patient to home. Procedure Code(s):        --- Professional ---                           [phone], 46, Esophagogastroduodenoscopy, flexible,                            transoral; with endoscopic ultrasound examination                            limited to the esophagus, stomach or duodenum, and                            adjacent structures Diagnosis Code(s):        --- Professional ---                           R93.5, Abnormal findings on diagnostic  imaging of                            other abdominal regions, including retroperitoneum CPT copyright 2019 American Medical Association. All rights reserved. The codes documented in this report are preliminary and upon coder review may  be revised to meet current compliance requirements. Milus Banister, MD 03/28/2020 9:58:31 AM This report has been signed electronically. Number of Addenda: 0

## 2020-03-28 NOTE — Transfer of Care (Signed)
Immediate Anesthesia Transfer of Care Note  Patient: Jill Rodriguez  Procedure(s) Performed: UPPER ENDOSCOPIC ULTRASOUND (EUS) RADIAL (N/A )  Patient Location: PACU  Anesthesia Type:General  Level of Consciousness: sedated, patient cooperative and responds to stimulation  Airway & Oxygen Therapy: Patient Spontanous Breathing and Patient connected to face mask oxygen  Post-op Assessment: Report given to RN and Post -op Vital signs reviewed and stable  Post vital signs: Reviewed and stable  Last Vitals:  Vitals Value Taken Time  BP    Temp    Pulse 78 03/28/20 1013  Resp 10 03/28/20 1013  SpO2 100 % 03/28/20 1013  Vitals shown include unvalidated device data.  Last Pain:  Vitals:   03/28/20 0801  PainSc: 0-No pain         Complications: No complications documented.

## 2020-03-28 NOTE — Discharge Instructions (Signed)
YOU HAD AN ENDOSCOPIC PROCEDURE TODAY: Refer to the procedure report and other information in the discharge instructions given to you for any specific questions about what was found during the examination. If this information does not answer your questions, please call [place] office at [phone] to clarify.  ? ?YOU SHOULD EXPECT: Some feelings of bloating in the abdomen. Passage of more gas than usual. Walking can help get rid of the air that was put into your GI tract during the procedure and reduce the bloating. If you had a lower endoscopy (such as a colonoscopy or flexible sigmoidoscopy) you may notice spotting of blood in your stool or on the toilet paper. Some abdominal soreness may be present for a day or two, also. ? ?DIET: Your first meal following the procedure should be a light meal and then it is ok to progress to your normal diet. A half-sandwich or bowl of soup is an example of a good first meal. Heavy or fried foods are harder to digest and may make you feel nauseous or bloated. Drink plenty of fluids but you should avoid alcoholic beverages for 24 hours. If you had a esophageal dilation, please see attached instructions for diet.   ? ?ACTIVITY: Your care partner should take you home directly after the procedure. You should plan to take it easy, moving slowly for the rest of the day. You can resume normal activity the day after the procedure however YOU SHOULD NOT DRIVE, use power tools, machinery or perform tasks that involve climbing or major physical exertion for 24 hours (because of the sedation medicines used during the test).  ? ?SYMPTOMS TO REPORT IMMEDIATELY: ? ?Following upper endoscopy (EGD, EUS, ERCP, esophageal dilation) ?Vomiting of blood or coffee ground material  ?New, significant abdominal pain  ?New, significant chest pain or pain under the shoulder blades  ?Painful or persistently difficult swallowing  ?New shortness of breath  ?Black, tarry-looking or red, bloody  stools ? ?FOLLOW UP:  ?If any biopsies were taken you will be contacted by phone or by letter within the next 1-3 weeks. Call [phone]  if you have not heard about the biopsies in 3 weeks.  ?Please also call with any specific questions about appointments or follow up tests.  ?

## 2020-03-29 ENCOUNTER — Telehealth: Payer: Self-pay

## 2020-03-29 ENCOUNTER — Telehealth: Payer: Self-pay | Admitting: *Deleted

## 2020-03-29 NOTE — Telephone Encounter (Signed)
Called and spoke with [facility]. Notified that original plan was to attempt EUS biopsy of lymph node vs. Repeat scan in 3 months. EUS was attempted and plan is to repeat scan in January. [facility] performed a PET scan 12/1 and I have requested the images for comparison. For now she needs to keep her follow up with Dr. Fransisca Connors on 05/01/20.  For Dr. Collie Siad team: She has to return to work next week or her employer has asked her to quit. She wants to return but at a lighter duty due to fatigue. She is asking if she can get a note for this. Please call Daisy and advise.

## 2020-03-29 NOTE — Telephone Encounter (Signed)
Request sent to [facility] radiology to power share PET/CT performed 03/27/20.

## 2020-03-29 NOTE — Telephone Encounter (Signed)
Daughter [name] called reporting that patient had her Endoscopy and is asking what the plan is now. Please return her call

## 2020-03-29 NOTE — Telephone Encounter (Signed)
Dr. Tasia Catchings, when would you like to see patient?

## 2020-04-01 ENCOUNTER — Other Ambulatory Visit: Payer: Self-pay

## 2020-04-01 NOTE — Telephone Encounter (Signed)
Ok for light duty work  follow up pending her PET scan resutls.

## 2020-04-01 NOTE — Telephone Encounter (Signed)
Dr. Tasia Catchings, [place] to provide work note for light duty?  When would you like to see her for follow up?

## 2020-04-02 ENCOUNTER — Other Ambulatory Visit: Payer: Self-pay

## 2020-04-02 ENCOUNTER — Ambulatory Visit
Admission: RE | Admit: 2020-04-02 | Discharge: 2020-04-02 | Disposition: A | Discharge status: Home / Self Care | Payer: Self-pay | Source: Ambulatory Visit | Attending: Obstetrics and Gynecology | Admitting: Obstetrics and Gynecology

## 2020-04-02 ENCOUNTER — Encounter (HOSPITAL_COMMUNITY): Payer: Self-pay | Admitting: Gastroenterology

## 2020-04-02 DIAGNOSIS — C538 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 uteri: Secondary | ICD-10-CM

## 2020-04-02 NOTE — Telephone Encounter (Signed)
Spoke to patient and pt's daughter, Jill Rodriguez. 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 She does not want to loose her job, but would like to return with restrictions due to fatigue and occasional nausea related to diagnosis.  Letter created and left downstairs for pt/daughter to pick up.    *letter in letter's tab*

## 2020-04-08 ENCOUNTER — Telehealth: Payer: Self-pay

## 2020-04-08 NOTE — Telephone Encounter (Signed)
Received a voicemail from Ju[employer] at [employer] request additional information in regards to patient's disability claim.  Message left on confidential voicemail for [employer] to return my call.

## 2020-04-10 ENCOUNTER — Ambulatory Visit: Payer: BC Managed Care – PPO | Admitting: Gastroenterology

## 2020-05-01 ENCOUNTER — Inpatient Hospital Stay: Payer: BC Managed Care – PPO | Attending: Obstetrics and Gynecology | Admitting: Obstetrics and Gynecology

## 2020-05-01 VITALS — BP 145/75 | HR 94 | Temp 97.8°F | Resp 20 | Wt 185.0 lb

## 2020-05-01 DIAGNOSIS — C538 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 uteri: Secondary | ICD-10-CM | POA: Diagnosis not present

## 2020-05-01 DIAGNOSIS — M79606 Pain in leg, unspecified: Secondary | ICD-10-CM | POA: Diagnosis not present

## 2020-05-01 DIAGNOSIS — Z79899 Other long term (current) drug therapy: Secondary | ICD-10-CM | POA: Diagnosis not present

## 2020-05-01 DIAGNOSIS — C539 Malignant neoplasm of cervix uteri, unspecified: Secondary | ICD-10-CM

## 2020-05-01 DIAGNOSIS — Z923 Personal history of irradiation: Secondary | ICD-10-CM | POA: Insufficient documentation

## 2020-05-01 DIAGNOSIS — M542 Cervicalgia: Secondary | ICD-10-CM | POA: Diagnosis not present

## 2020-05-01 DIAGNOSIS — R5383 Other fatigue: Secondary | ICD-10-CM | POA: Diagnosis not present

## 2020-05-01 DIAGNOSIS — F32A Depression, unspecified: Secondary | ICD-10-CM | POA: Insufficient documentation

## 2020-05-01 DIAGNOSIS — R531 Weakness: Secondary | ICD-10-CM | POA: Diagnosis not present

## 2020-05-01 DIAGNOSIS — R140 Abdominal distension (gaseous): Secondary | ICD-10-CM | POA: Diagnosis not present

## 2020-05-01 DIAGNOSIS — R202 Paresthesia of skin: Secondary | ICD-10-CM | POA: Insufficient documentation

## 2020-05-01 DIAGNOSIS — Z8616 Personal history of COVID-19: Secondary | ICD-10-CM | POA: Diagnosis not present

## 2020-05-01 DIAGNOSIS — Z9221 Personal history of antineoplastic chemotherapy: Secondary | ICD-10-CM | POA: Diagnosis not present

## 2020-05-01 MED ORDER — TRAMADOL HCL 50 MG PO TABS: 50.0000 mg | ORAL_TABLET | Freq: Four times a day (QID) | ORAL | 0 refills | Status: DC | PRN

## 2020-05-01 NOTE — Progress Notes (Signed)
dGynecologic Oncology Consult Visit   Referring Provider: Dr. 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 married P3 female who is seen in consultation from Dr. Thurnell Garbe for cervical cancer.  Accompanied by her daughter and interpreter present.   Complains of left neck pain and feels there are enlarged nodes in that area. She has fatigue, depression and weakness. Some abdominal distention and pain as well as leg pain and tingling.   Gynecologic Oncology History Bleeding for years.  She had COVID in July 2020 and was supposed to have follow up for long term vaginal bleeding but not seen until 4/21.  Patient seen by Dr Marcelline Mates 08/16/19 for PMB. " [id number] Spanish speaking post-menopausal female who presents as a referral from [facility] for concerns regarding postmenopausal bleeding. She has been menopausal since age 75. She has never been on HRT. Bleeding has been ongoing for the past several months per records from the past visit, however patient reports that it has been off and on for the past year. She reports passage of clots at times, and otherwise sometimes is just spotting.  Bleeding can last from days to weeks and can range from light to moderate flow. Associated crampy pelvic/abdominal pain.  Workup to date: CBC and TSH.  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 No endometrium seen. CERVIX:friable, lesion present: cervix with irregularly shaped growth that is possibly eroding the cervix, firm.  A. ENDOMETRIUM, BIOPSY:  Poorly differentiated squamous cell carcinoma.  COMMENT:  There are fragments of poorly differentiated squamous cell carcinoma. Endometrial tissue is not identified.   PDL1=0  08/23/19 Pelvic US The Endometriummeasures 8 mm. Echogenic endometrial microcalcifications seen through -out endometrium. An hypoechoic area seen in the fundal aspect of the endometrium measuring 0.8 x 1.1 x 2.3 cm Right Ovary measures 1.6 x 1.1 x 1.2 cm. It is normal in appearance. Left Ovary measures 2.2 x 1.5 x 1.7 cm. It is normal in appearance. Survey of the adnexa demonstrates no adnexal masses. There is no free fluid in the cul de sac. Impression: 1. Endometrial microcalcifications as described above with a hypoechoic area most likely retain blood in the fundal aspect of the endometrium. 2. Survey of the adnexa demonstrates no adnexal masses.  Last pap smear: 2019.  Patient denies history of abnormal pap smears.  Mammogram: 2015.  Ordered by PCP in December 2020.   She has prominent right neck pulse and CT angio neck was ordered for evaluation.  09/13/19 1. Positive for generalized arterial tortuosity, but negative for vascular malformation, aneurysm, soft tissue mass, or metastatic disease in the neck. 2. Minimal atherosclerosis in the neck, no arterial stenosis. Dominant left vertebral artery (normal variant).  09/06/2019 PET scan showed hypermetabolic mass involving the cervix and the lower uterine segment consistent with known cervical cancer. Multiple small hypermetabolic retroperitoneal and pelvic lymph node bilaterally consistent with nodal metastasis. She also has small hypermetabolic lymph nodes in the left superior mediastinum and left supraclavicular regions are also suspicious for nodal metastasis.  Hepatic steatosis no cholelithiasis noted.  At least Stage IIIc Cervix cancer, with small hypermetabolic lymph nodes in the left superior mediastinum and left supraclavicular region which could put her to stage IV disease.  Treated with  chemoradiation with external radiation and weekly cisplatin and brachytherapy. The hypermetabolic activity improved after concurrent  chemoradiation.   7/21 PET/CT IMPRESSION: 1. Marked decrease in hypermetabolism associated with the cervix and lower uterine segment consistent with interval response to therapy. 2. The multiple small hypermetabolic retroperitoneal and pelvic sidewall lymph nodes seen on the previous study have decreased in size and hypermetabolism in the interval. FDG uptake in most of these nodes today is at blood pool levels. Findings consistent with response to therapy. 3. Left supraclavicular and mediastinal hypermetabolic nodes have resolved. 4. Increased FDG accumulation in the distal esophagus. No mass lesion evident by CT. This may reflect esophagitis, but direct visualization may be warranted. 5. Hepatic steatosis. 6. Cholelithiasis. 7.  Aortic Atherosclerois (ICD10-170.0)  02/06/2020 repeat PET scan showed recurrent hypermetabolic left mediastinum lymph node.  No additional sites of recurrent hypermetabolic metastasis.  Residual mild hypermetabolism within the uterine cervix, slightly decreased.  2 small lung nodules with activity below PET resolution. Nonspecific persistent mild hypermetabolic 7 in the lower thoracic esophageal without discrete mass.  Diffuse hepatic steatosis, cholelithiasis atherosclerosis. Dr. Tasia Catchings recommend patient to establish care with gastroenterology for EGD for direct visualization of the mild hypermetabolic lower thoracic esophagus to rule out esophageal etiology.  04/02/20 EGD biopsy attempted by GI in [place], but not technically possible.    Saw Dr Christel Mormon 03/27/20 and PET scan 04/03/20 IMPRESSION: 1. Subcentimeter hypermetabolic cervical, mediastinal, and retrocrural lymph nodes, concerning for lymphatic metastases. 2. Multiple tiny indeterminate pulmonary nodules below the resolution of PET. Recommend continued attention on follow-up. 3. No abnormal FDG uptake within the cervix at site of known primary malignancy, as can be seen with post-treatment  change.  Problem List: Patient Active Problem List   Diagnosis Date Noted  . Nausea with vomiting 10/31/2019  . Heartburn 10/17/2019  . 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 ([place]) 10/10/2019  . Encounter for antineoplastic chemotherapy 10/10/2019  . Non-intractable vomiting 10/10/2019  . Thrush 10/10/2019  . Malignant neoplasm of endocervix ([place]) 10/03/2019  . Goals of care, counseling/discussion 09/20/2019  . Cervical cancer ([place]) 08/30/2019    Past Medical History: Past Medical History:  Diagnosis Date  . Cancer ([place])   . COVID-19 10/2018  . Nausea with vomiting 10/31/2019  . No pertinent past medical history     Past Surgical History: Past Surgical History:  Procedure Laterality Date  . ESOPHAGOGASTRODUODENOSCOPY (EGD) WITH PROPOFOL N/A 03/28/2020   Procedure: ESOPHAGOGASTRODUODENOSCOPY (EGD) WITH PROPOFOL;  Surgeon: Milus Banister, MD;  Location: [facility];  Service: Endoscopy;  Laterality: N/A;  . EUS N/A 03/28/2020   Procedure: UPPER ENDOSCOPIC ULTRASOUND (EUS) RADIAL;  Surgeon: Milus Banister, MD;  Location: [facility];  Service: Endoscopy;  Laterality: N/A;  . no surgical history      OB History:  OB History  Gravida Para Term Preterm AB Living  '3 3 3     3  ' SAB IAB Ectopic Multiple Live Births          3    # Outcome Date GA Lbr Len/2nd Weight Sex Delivery Anes PTL Lv  3 Term 23    F Vag-Spont   LIV  2 Term 1985    F Vag-Spont   LIV  1 Term 46    M Vag-Spont   LIV    Family History: Family History  Problem Relation Age of Onset  . Blindness Mother   . Glaucoma Mother   . Diabetes Father   . Diabetes Brother  Social History: Social History   Socioeconomic History  . Marital status: Married    Spouse name: Not on file  . Number of children: Not on file  . Years of education: Not on file  . Highest education level: Not on file  Occupational History  . Not on file  Tobacco Use  . Smoking status: Never Smoker  .  Smokeless tobacco: Never Used  Vaping Use  . Vaping Use: Never used  Substance and Sexual Activity  . Alcohol use: Never  . Drug use: Never  . Sexual activity: Yes    Birth control/protection: None  Other Topics Concern  . Not on file  Social History Narrative  . Not on file   Social Determinants of Health   Financial Resource Strain: Not on file  Food Insecurity: Not on file  Transportation Needs: Not on file  Physical Activity: Not on file  Stress: Not on file  Social Connections: Not on file  Intimate Partner Violence: Not on file    Allergies: No Known Allergies  Current Medications: Current Outpatient Medications  Medication Sig Dispense Refill  . ciprofloxacin (CIPRO) 500 MG tablet Take 1 tablet (500 mg total) by mouth 2 (two) times daily. (Patient not taking: Reported on 11/07/2019) 10 tablet 0  . dexamethasone (DECADRON) 4 MG tablet Take 2 tablets (8 mg total) by mouth See admin instructions. Take 57m daily for 2 days after each chemotherapy-cisplatin treatments. (Patient not taking: Reported on 11/07/2019) 24 tablet 0  . nystatin (MYCOSTATIN) 100000 UNIT/ML suspension Use as directed 5 mLs (500,000 Units total) in the mouth or throat 4 (four) times daily. (Patient not taking: Reported on 12/11/2019) 120 mL 0  . OLANZapine (ZYPREXA) 10 MG tablet Take 1 tablet (10 mg total) by mouth at bedtime. (Patient not taking: Reported on 02/12/2020) 30 tablet 0  . omeprazole (PRILOSEC) 20 MG capsule Take 1 capsule (20 mg total) by mouth daily. (Patient not taking: Reported on 02/12/2020) 30 capsule 1  . ondansetron (ZOFRAN ODT) 4 MG disintegrating tablet Take 1 tablet (4 mg total) by mouth every 8 (eight) hours as needed. (Patient not taking: Reported on 02/12/2020) 20 tablet 0  . prochlorperazine (COMPAZINE) 10 MG tablet Take 1 tablet (10 mg total) by mouth every 6 (six) hours as needed (Nausea or vomiting). (Patient not taking: Reported on 02/12/2020) 30 tablet 1  . promethazine  (PHENERGAN) 25 MG suppository Place 1 suppository (25 mg total) rectally every 6 (six) hours as needed for nausea, vomiting or refractory nausea / vomiting. (Patient not taking: Reported on 02/12/2020) 12 each 1  . senna (SENOKOT) 8.6 MG TABS tablet Take 2 tablets (17.2 mg total) by mouth daily. (Patient not taking: Reported on 02/12/2020) 120 tablet 0  . traMADol (ULTRAM) 50 MG tablet Take 1 tablet (50 mg total) by mouth every 6 (six) hours as needed. (Patient not taking: Reported on 02/12/2020) 12 tablet 0   No current facility-administered medications for this visit.    Review of Systems General: negative for, fevers, chills, fatigue, changes in sleep, changes in weight or appetite Skin: negative for changes in color, texture, moles or lesions Eyes: negative for, changes in vision, pain, diplopia Pulmonary: negative for, dyspnea, orthopnea, productive cough Cardiac: negative for, palpitations, syncope, pain, discomfort, pressure Gastrointestinal: negative for, dysphagia, nausea, vomiting, jaundice, pain, constipation, diarrhea, hematemesis, hematochezia Musculoskeletal: negative for, pain, stiffness, swelling, range of motion limitation Hematology: negative for, easy bruising Neurologic/Psych: negative for, headaches, seizures, paralysis, weakness, tremor, change in gait, change in  sensation, mood swings, depression, anxiety, change in memory  Objective:  Physical Examination:  BP (!) 145/75   Pulse 94   Temp 97.8 F (36.6 C)   Resp 20   Wt 185 lb (83.9 kg)   SpO2 98%   BMI 34.96 kg/m    ECOG Performance Status: 1 - Symptomatic but completely ambulatory  General appearance: alert, cooperative and appears stated age HEENT:PERRLA and thyroid without masses Neck: visible bounding pulse in right neck, as previously.  No bruit appreciated.  Prominent fullness in left neck concerning for adenopathy.    Lymph node survey: non-palpable, axillary, inguinal,  supraclavicular Cardiovascular: regular rate and rhythm, no murmurs or gallops Respiratory: normal air entry, lungs clear to auscultation and no rales, rhonchi or wheezing Breast exam: not examined. Abdomen: no hernias and well healed incision Back: inspection of back is normal Extremities: extremities normal, atraumatic, no cyanosis or edema Skin exam - normal coloration and turgor, no rashes, no suspicious skin lesions noted. Neurological exam reveals alert, oriented, normal speech, no focal findings or movement disorder noted.  Pelvic: exam chaperoned by nurse;  Vulva: normal appearing vulva with no masses, tenderness or lesions; Vagina: normal vagina; Adnexa: normal adnexa in size, nontender and no masses; Uterus: uterus is normal size, shape, consistency and nontender; Cervix: no lesions; Rectal: confirms  Lab Review Labs on site today: Lab Results  Component Value Date   WBC 4.9 01/29/2020   HGB 12.5 01/29/2020   HCT 33.5 (L) 01/29/2020   MCV 98.8 01/29/2020   PLT 298 01/29/2020       Assessment:  Jill Rodriguez is a 60 y.o.  female diagnosed with poorly differentiated squamous cell cervical cancer 4/21 after at least 8 month delay in work up of postmenopausal bleeding due to her getting COVID and other issues. The cancer was about 3 cm in diameter and replaced the whole cervix.  At least Stage IIIc cervix cancer, with small hypermetabolic lymph nodes in the left superior mediastinum and left supraclavicular region on PET which could make her stage IV.  Treated with chemoradiation with external radiation and weekly cisplatin and then brachytherapy at [facility]. The hypermetabolic activity improved after concurrent chemoradiation.   PET/CT in 02/06/2020 showed recurrent hypermetabolic left mediastinum lymph node. EGD for attempted for biopsy 04/02/20 was not technically possible.    Repeat PET/CT 04/03/20 subcentimeter hypermetabolic cervical, mediastinal, and retrocrural lymph nodes,  concerning for lymphatic metastases. Concerning left neck adenopathy on exam today.   PDL1 = 0  Now palpable fullness and pain in left neck.  Tramadol she was prescribed helps, but ran out.   Medical co-morbidities complicating care: none.  Plan:   Problem List Items Addressed This Visit      Genitourinary   Cervical cancer ([place]) - Primary     We discussed options for management including biopsy of left SCN and assuming this shows recurrence treatment with carboplatin/taxol, bevacizumab. In view of negative PDL1 would not add Pembrolizumab.  Topotecan/Taxol/Bevacizumab would also be an option. Or she could get topotecan in the future if cabo/taxol used now.   I spoke to Dr Tasia Catchings in medical oncology and she is in agreement.   Will renew tramadol prescription for pain.   The patient's diagnosis, an outline of the further diagnostic and laboratory studies which will be required, the recommendation, and alternatives were discussed with the patient and daughter with interpreter present.  All questions were answered to the patient's satisfaction.  Mellody Drown, MD  CC:  Crissie Figures,  PA-C [address] [place],  [place] [address] [phone]

## 2020-05-03 ENCOUNTER — Telehealth: Payer: Self-pay

## 2020-05-03 NOTE — Telephone Encounter (Signed)
Request for biopsy of left cervical lymph node was faxed to specialized scheduling on 1/5. Pending scheduling, but will need to see MD 1 week after biopsy.

## 2020-05-06 ENCOUNTER — Ambulatory Visit: Payer: BC Managed Care – PPO | Admitting: Radiation Oncology

## 2020-05-06 NOTE — Telephone Encounter (Signed)
Jill Rodriguez is scheduled for [place] [facility] Bx on Thurs 05/09/20 at 1:30p Arrive 12:30p.   Please schedule patient for MD follow up 1 week after biospy. I will call pt with appts.

## 2020-05-06 NOTE — Telephone Encounter (Signed)
Done... Pt has been sched to RTC on 05/16/20 @ 11:00am

## 2020-05-06 NOTE — Telephone Encounter (Signed)
Patient's daughter, [name], notified  of appts. She has already received a call from IR nurse with instructions.

## 2020-05-06 NOTE — Progress Notes (Incomplete)
Called and spoke with pt. Daughter [name] re: pre-procedural instructions for biopsy 05/09/2020. Pt. To be NPO after MN, report to medical mall for admission, hold all blood thinners (on none at present), and have a ride for transport home. Daughter states her father will drop them off and then pick them up. Daughter [name] to act as interpretor for pt. (release signed). Pt. Daughter [name] verbalized understanding of all instructions given. States "my mother is at work right now." Daughter to review instructions with mother as mother speaks only [place].

## 2020-05-07 ENCOUNTER — Other Ambulatory Visit: Payer: Self-pay | Admitting: Student

## 2020-05-08 ENCOUNTER — Other Ambulatory Visit: Payer: Self-pay | Admitting: Radiology

## 2020-05-09 ENCOUNTER — Other Ambulatory Visit: Payer: Self-pay

## 2020-05-09 ENCOUNTER — Other Ambulatory Visit: Payer: Self-pay | Admitting: Nurse Practitioner

## 2020-05-09 ENCOUNTER — Ambulatory Visit
Admission: RE | Admit: 2020-05-09 | Discharge: 2020-05-09 | Disposition: A | Discharge status: Home / Self Care | Payer: BC Managed Care – PPO | Source: Ambulatory Visit | Attending: Nurse Practitioner | Admitting: Nurse Practitioner

## 2020-05-09 DIAGNOSIS — C7989 Secondary malignant neoplasm of other specified sites: Secondary | ICD-10-CM | POA: Insufficient documentation

## 2020-05-09 DIAGNOSIS — Z9221 Personal history of antineoplastic chemotherapy: Secondary | ICD-10-CM | POA: Insufficient documentation

## 2020-05-09 DIAGNOSIS — C539 Malignant neoplasm of cervix uteri, unspecified: Secondary | ICD-10-CM

## 2020-05-09 DIAGNOSIS — R599 Enlarged lymph nodes, unspecified: Secondary | ICD-10-CM | POA: Diagnosis present

## 2020-05-09 DIAGNOSIS — Z79899 Other long term (current) drug therapy: Secondary | ICD-10-CM | POA: Diagnosis not present

## 2020-05-09 DIAGNOSIS — R590 Localized enlarged lymph nodes: Secondary | ICD-10-CM

## 2020-05-09 NOTE — Progress Notes (Signed)
Per Dr. Anselm Pancoast, to observe pt. For 30 min. Secondary to mild swelling to Left clavicular/neck area post biopsy. Pt. Denies any c/o pain at site (per daughter interpretor). No active drainage or hematoma. Mild 1-2+ swelling at site.

## 2020-05-09 NOTE — H&P (Signed)
Chief Complaint: Patient was seen in consultation today for left supraclavicular lymph node biopsy   Referring Physician(s): Beckey Rutter, NP  Supervising Physician: Markus Daft  Patient Status: [facility] - Out-pt  History of Present Illness: Jill Rodriguez is a 60 y.o. female with a medical history significant for cervical cancer, s/p chemotherapy and radiation. Recent PET imaging showed hypermetabolic cervical, mediastinal and retrocrural lymph nodes, concerning for lymphatic metastases. She underwent an upper endoscopy 03/28/20 with lymph node visibility on exam but the physician was unable to obtain a sample due to overlying blood vessels.   Interventional Radiology has been asked to evaluate this patient for an image-guided left supraclavicular lymph node biopsy. Imaging has been reviewed and procedure approved by Dr. Kathlene Cote.   Past Medical History:  Diagnosis Date  . Cancer ([place])    cervical CA, that spread to lymph nodes; last chemo was 12/2019; radiation 01/2020  . COVID-19 10/2018  . Nausea with vomiting 10/31/2019  . No pertinent past medical history     Past Surgical History:  Procedure Laterality Date  . ESOPHAGOGASTRODUODENOSCOPY (EGD) WITH PROPOFOL N/A 03/28/2020   Procedure: ESOPHAGOGASTRODUODENOSCOPY (EGD) WITH PROPOFOL;  Surgeon: Milus Banister, MD;  Location: [facility];  Service: Endoscopy;  Laterality: N/A;  . EUS N/A 03/28/2020   Procedure: UPPER ENDOSCOPIC ULTRASOUND (EUS) RADIAL;  Surgeon: Milus Banister, MD;  Location: [facility];  Service: Endoscopy;  Laterality: N/A;  . no surgical history      Allergies: Patient has no known allergies.  Medications: Prior to Admission medications   Medication Sig Start Date End Date Taking? Authorizing Provider  ciprofloxacin (CIPRO) 500 MG tablet Take 1 tablet (500 mg total) by mouth 2 (two) times daily. Patient not taking: No sig reported 10/31/19   Earlie Server, MD  dexamethasone (DECADRON) 4 MG tablet Take 2  tablets (8 mg total) by mouth See admin instructions. Take 8mg  daily for 2 days after each chemotherapy-cisplatin treatments. Patient not taking: No sig reported 10/10/19   Earlie Server, MD  nystatin (MYCOSTATIN) 100000 UNIT/ML suspension Use as directed 5 mLs (500,000 Units total) in the mouth or throat 4 (four) times daily. Patient not taking: No sig reported 10/25/19   Earlie Server, MD  OLANZapine (ZYPREXA) 10 MG tablet Take 1 tablet (10 mg total) by mouth at bedtime. Patient not taking: No sig reported 12/11/19   Earlie Server, MD  omeprazole (PRILOSEC) 20 MG capsule Take 1 capsule (20 mg total) by mouth daily. Patient not taking: No sig reported 12/11/19   Earlie Server, MD  ondansetron (ZOFRAN ODT) 4 MG disintegrating tablet Take 1 tablet (4 mg total) by mouth every 8 (eight) hours as needed. Patient not taking: No sig reported 01/30/20   Alfred Levins [place], MD  prochlorperazine (COMPAZINE) 10 MG tablet Take 1 tablet (10 mg total) by mouth every 6 (six) hours as needed (Nausea or vomiting). Patient not taking: No sig reported 09/20/19   Earlie Server, MD  promethazine (PHENERGAN) 25 MG suppository Place 1 suppository (25 mg total) rectally every 6 (six) hours as needed for nausea, vomiting or refractory nausea / vomiting. Patient not taking: No sig reported 11/07/19   Earlie Server, MD  senna (SENOKOT) 8.6 MG TABS tablet Take 2 tablets (17.2 mg total) by mouth daily. Patient not taking: No sig reported 10/24/19   Earlie Server, MD  traMADol (ULTRAM) 50 MG tablet Take 1 tablet (50 mg total) by mouth every 6 (six) hours as needed. 05/01/20   Verlon Au, NP  Family History  Problem Relation Age of Onset  . Blindness Mother   . Glaucoma Mother   . Diabetes Father   . Diabetes Brother     Social History   Socioeconomic History  . Marital status: Married    Spouse name: Rosezetta Schlatter   . Number of children: 3  . Years of education: Not on file  . Highest education level: Not on file  Occupational History  . Not on file   Tobacco Use  . Smoking status: Never Smoker  . Smokeless tobacco: Never Used  Vaping Use  . Vaping Use: Never used  Substance and Sexual Activity  . Alcohol use: Never  . Drug use: Never  . Sexual activity: Yes    Birth control/protection: None  Other Topics Concern  . Not on file  Social History Narrative   Lives at home with spouse ; daughter [name] comes & stays when needed   Social Determinants of Health   Financial Resource Strain: Not on file  Food Insecurity: Not on file  Transportation Needs: Not on file  Physical Activity: Not on file  Stress: Not on file  Social Connections: Not on file    Review of Systems: A 12 point ROS discussed and pertinent positives are indicated in the HPI above.  All other systems are negative.  Review of Systems  Constitutional: Negative for appetite change and fatigue.  Respiratory: Negative for cough and shortness of breath.   Cardiovascular: Negative for chest pain and leg swelling.  Gastrointestinal: Negative for abdominal pain, diarrhea, nausea and vomiting.  Musculoskeletal: Negative for back pain.  Neurological: Negative for headaches.  Hematological: Negative for adenopathy.       Patient stated she had a tender swollen area in the left neck but it is gone now.     Vital Signs: BP (!) 142/73   Pulse 70   Temp 98.1 F (36.7 C) (Oral)   Resp 20   Ht 5\' 1"  (1.549 m)   Wt 180 lb (81.6 kg)   SpO2 97%   BMI 34.01 kg/m   Physical Exam Constitutional:      General: She is not in acute distress. Cardiovascular:     Rate and Rhythm: Normal rate and regular rhythm.     Pulses: Normal pulses.     Heart sounds: Normal heart sounds.  Pulmonary:     Effort: Pulmonary effort is normal.     Breath sounds: Normal breath sounds.  Abdominal:     General: Bowel sounds are normal.     Palpations: Abdomen is soft.  Musculoskeletal:     Cervical back: Normal range of motion.  Skin:    General: Skin is warm and dry.   Neurological:     Mental Status: She is alert and oriented to person, place, and time.     Imaging: No results found.  Labs:  CBC: Recent Labs    10/31/19 1413 11/02/19 0803 11/07/19 1054 01/29/20 1738  WBC 1.7* 2.4* 1.7* 4.9  HGB 11.2* 10.3* 11.1* 12.5  HCT 32.5* 30.0* 32.1* 33.5*  PLT 125* 111* 111* 298    COAGS: No results for input(s): INR, APTT in the last 8760 hours.  BMP: Recent Labs    10/31/19 1413 11/02/19 0803 11/07/19 1054 01/29/20 1738  NA 138 139 138 138  K 3.4* 3.4* 3.9 3.6  CL 103 106 103 103  CO2 27 24 28 25   GLUCOSE 134* 139* 155* 101*  BUN 13 19 24* 10  CALCIUM  8.2* 8.3* 8.5* 9.4  CREATININE 0.67 0.58 0.80 0.57  GFRNONAA >60 >60 >60 >60  GFRAA >60 >60 >60 >60    LIVER FUNCTION TESTS: Recent Labs    10/31/19 1413 11/02/19 0803 11/07/19 1054 01/29/20 1738  BILITOT 0.4 0.4 0.5 0.6  AST 20 21 33 38  ALT 30 29 71* 27  ALKPHOS 81 76 76 73  PROT 6.8 6.6 6.7 7.9  ALBUMIN 3.5 3.6 3.6 3.9    TUMOR MARKERS: No results for input(s): AFPTM, CEA, CA199, CHROMGRNA in the last 8760 hours.  Assessment and Plan:  Adenopathy; history of cervical cancer: Jill Rodriguez, 60 year old female, presents today to the [facility] Interventional Radiology department for an image-guided left supraclavicular lymph node biopsy. Her daughter was present at the bedside and provided Spanish interpretation, per the patient's request. This procedure will be done with local anesthesia only.   Risks and benefits of this procedure was discussed with the patient and/or patient's family including, but not limited to bleeding, infection, damage to adjacent structures or low yield requiring additional tests.  All of the questions were answered and there is agreement to proceed.  Consent signed and in chart.   Thank you for this interesting consult.  I greatly enjoyed meeting Jill Rodriguez and look forward to participating in their  care.  A copy of this report was sent to the requesting provider on this date.  Electronically Signed: Soyla Dryer, AGACNP-BC [phone] 05/09/2020, 1:12 PM   I spent a total of  30 Minutes   in face to face in clinical consultation, greater than 50% of which was counseling/coordinating care for left supraclavicular lymph node biopsy.

## 2020-05-09 NOTE — Procedures (Signed)
Interventional Radiology Procedure:   Indications: Suspicious cervical lymphadenopathy, history of cervical cancer  Procedure: US guided FNA and core biopsy of left supraclavicular lymph nodes  Findings: Small left supraclavicular nodes.  FNA of #1 node (lateral) and FNA and core biopsy of #2 node (medial).  Complications: None     EBL: less than 10 ml  Plan: Discharge to home.  Jameya Pontiff R. Anselm Pancoast, MD  Pager: [phone]

## 2020-05-09 NOTE — Progress Notes (Signed)
NP spoke with Pt. And her daughter: consent signed with interpretor Ronnald Collum. Dr. Anselm Pancoast also came by and spoke with pt. And daughter : both verbalize understanding of conversation (with interpretor-daughter).

## 2020-05-09 NOTE — Discharge Instructions (Signed)

## 2020-05-16 ENCOUNTER — Inpatient Hospital Stay: Payer: BC Managed Care – PPO | Admitting: Oncology

## 2020-05-16 LAB — CYTOLOGY - NON PAP

## 2020-05-16 LAB — SURGICAL PATHOLOGY

## 2020-05-20 ENCOUNTER — Encounter: Payer: Self-pay | Admitting: Oncology

## 2020-05-20 ENCOUNTER — Inpatient Hospital Stay (HOSPITAL_BASED_OUTPATIENT_CLINIC_OR_DEPARTMENT_OTHER): Payer: BC Managed Care – PPO | Admitting: Oncology

## 2020-05-20 VITALS — BP 139/65 | HR 75 | Temp 97.1°F | Resp 18 | Wt 181.4 lb

## 2020-05-20 DIAGNOSIS — Z7189 Other specified counseling: Secondary | ICD-10-CM

## 2020-05-20 DIAGNOSIS — C530 Malignant neoplasm of endocervix: Secondary | ICD-10-CM | POA: Diagnosis not present

## 2020-05-20 DIAGNOSIS — R590 Localized enlarged lymph nodes: Secondary | ICD-10-CM | POA: Insufficient documentation

## 2020-05-20 DIAGNOSIS — C538 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 uteri: Secondary | ICD-10-CM | POA: Diagnosis not present

## 2020-05-20 MED ORDER — DEXAMETHASONE 4 MG PO TABS: 8.0000 mg | ORAL_TABLET | Freq: Every day | ORAL | 1 refills | Status: DC

## 2020-05-20 MED ORDER — PROCHLORPERAZINE MALEATE 10 MG PO TABS: 10.0000 mg | ORAL_TABLET | Freq: Four times a day (QID) | ORAL | 1 refills | Status: DC | PRN

## 2020-05-20 MED ORDER — ONDANSETRON HCL 8 MG PO TABS: 8.0000 mg | ORAL_TABLET | Freq: Two times a day (BID) | ORAL | 1 refills | Status: DC | PRN

## 2020-05-20 NOTE — Progress Notes (Signed)
DISCONTINUE OFF PATHWAY REGIMEN - Other   [id number]:Cisplatin 40 mg/m2 IV D1 q7 Days + RT:   A cycle is every 7 days:     Cisplatin   **Always confirm dose/schedule in your pharmacy ordering system**  REASON: Disease Progression PRIOR TREATMENT: Cisplatin 40 mg/m2 IV D1 q7 Days + RT TREATMENT RESPONSE: Progressive Disease (PD)  START OFF PATHWAY REGIMEN - Other   [id number]:Bevacizumab 15 mg/kg IV D1 + Carboplatin AUC=5 IV D1 + Paclitaxel 175 mg/m2 IV D1 q21 Days:   A cycle is every 21 days:     Bevacizumab-xxxx      Paclitaxel      Carboplatin   **Always confirm dose/schedule in your pharmacy ordering system**  Patient Characteristics: Intent of Therapy: Non-Curative / Palliative Intent, Discussed with Patient 

## 2020-05-20 NOTE — Progress Notes (Signed)
Hematology/Oncology follow up note [facility] Telephone:([phone] Fax:([phone]) [phone]   Patient Care Team: Crissie Figures, PA-C as PCP - General (Physician Assistant) Clent Jacks, RN as Oncology Nurse Navigator  REFERRING PROVIDER: Crissie Figures, PA-C  CHIEF COMPLAINTS/REASON FOR VISIT:  Follow up for cervical cancer, treatment related symptoms.  HISTORY OF PRESENTING ILLNESS:   Jill Rodriguez is a  60 y.o.  female with PMH listed below was seen in consultation at the request of  Crissie Figures, PA-C  for evaluation of cervical cancer.   Patient is G3, P3 Spanish-speaking postmenopausal female who initially presented with vaginal bleeding.  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  No intervention seen.  08/23/2019, pelvis ultrasound showed endometrium measures 8 mm.  Echogenic endometrial microcalcification seen throughout endometrium.  Hypoechoic area in the fundal aspect of the endometrium measures 0.8 x 1.1 x 2.3 cm Survey of the adnexa showed no adnexal masses.  Patient has history of prominent right neck pelvis and CT angio neck has been ordered. Patient was seen by Dr. Fransisca Connors, clinically the tumor is 3 cm and replaced whole cervix. 09/06/2019 PET scan showed hypermetabolic mass involving the cervix and the lower uterine segment consistent with known cervical cancer. Multiple small hypermetabolic retroperitoneal and pelvic lymph node bilaterally consistent with nodal metastasis. She also has small hypermetabolic lymph nodes in the left superior mediastinum and left supraclavicular regions are also suspicious for nodal metastasis.  Hepatic steatosis no cholelithiasis noted.  At least Stage IIIc Cervix cancer, with small hypermetabolic lymph  nodes in the left superior mediastinum and left supraclavicular region which could put her to stage IV disease. hypermetabolic activity initially improved after concurrent chemoradiation.  # Cancer Treatment:  July 2021 Finished concurrent chemoradiation with weekly Cisplatin.   # 02/06/2020 repeat PET scan showed recurrent hypermetabolic left mediastinum lymph node.  No additional sites of recurrent hypermetabolic metastasis.  Residual mild hypermetabolism within the uterine cervix, slightly decreased.  2 small lung nodules with activity below PET resolution. Nonspecific persistent mild hypermetabolic 7 in the lower thoracic esophageal without discrete mass.  Diffuse hepatic steatosis, cholelithiasis atherosclerosis # She underwent EUS for further evaluation of esophageal hypermetabolic activity as well attempt to get biopsy, not feasible   INTERVAL HISTORY Jill Rodriguez is a 60 y.o. female who has above history reviewed by me today presents for evaluation prior to chemotherapy for treatments of at least stage IIIc cervical cancer.  Problems and complaints are listed below: Patient is accompanied by her daughter. Spanish interpreter was present for the entire encounter During the interval, patient has had a PET scan done at [facility], 04/03/2020, PET scan showed subcentimeter hypermetabolic cervical, mediastinal, retrocrural lymph nodes, concerning for lymphatic metastasis.  Multiple tiny indeterminate pulmonary nodule below the resolution of PET.  No abnormal FDG uptake within the cervix at the site of known primary malignancy. Patient has also developed a more supraclavicular fullness.  Patient was seen by Dr. Fransisca Connors on 05/01/2020.  Decision was made to proceed with biopsy of the left supraclavicular lymph node.  Patient and daughter present to discuss biopsy results and future management plan.  Patient has no new complaints.  Left supraclavicular lymph node status post biopsy, some soreness  and bruising.  Review of Systems  Constitutional: Positive for fatigue. Negative for appetite change, chills and fever.  HENT:   Negative for hearing loss and voice change.   Eyes: Negative for eye problems.  Respiratory: Negative for chest tightness and cough.   Cardiovascular: Negative for chest pain.  Gastrointestinal: Negative for abdominal distention, abdominal pain, blood in stool, nausea and vomiting.  Endocrine: Negative for hot flashes.  Genitourinary: Negative for difficulty urinating and frequency.   Musculoskeletal: Negative for arthralgias.  Skin: Negative for itching and rash.  Neurological: Negative for extremity weakness.  Hematological: Negative for adenopathy.  Psychiatric/Behavioral: Negative for confusion.    MEDICAL HISTORY:  Past Medical History:  Diagnosis Date  . Cancer ([place])    cervical CA, that spread to lymph nodes; last chemo was 12/08/2019; radiation 01/2020  . COVID-19 10/2018  . Nausea with vomiting 10/31/2019  . No pertinent past medical history     SURGICAL HISTORY: Past Surgical History:  Procedure Laterality Date  . ESOPHAGOGASTRODUODENOSCOPY (EGD) WITH PROPOFOL N/A 03/28/2020   Procedure: ESOPHAGOGASTRODUODENOSCOPY (EGD) WITH PROPOFOL;  Surgeon: Milus Banister, MD;  Location: [facility];  Service: Endoscopy;  Laterality: N/A;  . EUS N/A 03/28/2020   Procedure: UPPER ENDOSCOPIC ULTRASOUND (EUS) RADIAL;  Surgeon: Milus Banister, MD;  Location: [facility];  Service: Endoscopy;  Laterality: N/A;  . no surgical history      SOCIAL HISTORY: Social History   Socioeconomic History  . Marital status: Married    Spouse name: Rosezetta Schlatter   . Number of children: 3  . Years of education: Not on file  . Highest education level: Not on file  Occupational History  . Not on file  Tobacco Use  . Smoking status: Never Smoker  . Smokeless tobacco: Never Used  Vaping Use  . Vaping Use: Never used  Substance and Sexual Activity  . Alcohol use:  Never  . Drug use: Never  . Sexual activity: Yes    Birth control/protection: None  Other Topics Concern  . Not on file  Social History Narrative   Lives at home with spouse ; daughter [name] comes & stays when needed   Social Determinants of Health   Financial Resource Strain: Not on file  Food Insecurity: Not on file  Transportation Needs: Not on file  Physical Activity: Not on file  Stress: Not on file  Social Connections: Not on file  Intimate Partner Violence: Not on file    FAMILY HISTORY: Family History  Problem Relation Age of Onset  . Blindness Mother   . Glaucoma Mother   . Diabetes Father   . Diabetes Brother     ALLERGIES:  has No Known Allergies.  MEDICATIONS:  Current Outpatient Medications  Medication Sig Dispense Refill  . traMADol (ULTRAM) 50 MG tablet Take 1 tablet (50 mg total) by mouth every 6 (six) hours as needed. 90 tablet 0  . ciprofloxacin (CIPRO) 500 MG tablet Take 1 tablet (500 mg total) by mouth 2 (two) times daily. (Patient not taking: No sig reported) 10 tablet 0  . dexamethasone (DECADRON) 4 MG tablet Take 2 tablets (8 mg total) by mouth See admin instructions. Take 8mg  daily for 2 days after each chemotherapy-cisplatin treatments. (Patient not taking: No sig reported) 24 tablet 0  . nystatin (MYCOSTATIN) 100000 UNIT/ML suspension Use as directed 5 mLs (500,000 Units total) in the mouth or throat 4 (four) times daily. (Patient not taking: No sig reported) 120 mL 0  . OLANZapine (ZYPREXA) 10 MG tablet Take 1 tablet (10 mg total) by mouth at bedtime. (Patient not taking: No sig reported) 30  tablet 0  . omeprazole (PRILOSEC) 20 MG capsule Take 1 capsule (20 mg total) by mouth daily. (Patient not taking: No sig reported) 30 capsule 1  . ondansetron (ZOFRAN ODT) 4 MG disintegrating tablet Take 1 tablet (4 mg total) by mouth every 8 (eight) hours as needed. (Patient not taking: No sig reported) 20 tablet 0  . promethazine (PHENERGAN) 25 MG suppository  Place 1 suppository (25 mg total) rectally every 6 (six) hours as needed for nausea, vomiting or refractory nausea / vomiting. (Patient not taking: No sig reported) 12 each 1  . senna (SENOKOT) 8.6 MG TABS tablet Take 2 tablets (17.2 mg total) by mouth daily. (Patient not taking: No sig reported) 120 tablet 0   No current facility-administered medications for this visit.     PHYSICAL EXAMINATION: ECOG PERFORMANCE STATUS: 1 - Symptomatic but completely ambulatory Vitals:   05/20/20 0939  BP: 139/65  Pulse: 75  Resp: 18  Temp: (!) 97.1 F (36.2 C)   Filed Weights   05/20/20 0939  Weight: 181 lb 6.4 oz (82.3 kg)    Physical Exam Constitutional:      General: She is not in acute distress. HENT:     Head: Normocephalic and atraumatic.  Eyes:     General: No scleral icterus. Cardiovascular:     Rate and Rhythm: Normal rate and regular rhythm.     Heart sounds: Normal heart sounds.  Pulmonary:     Effort: Pulmonary effort is normal. No respiratory distress.     Breath sounds: No wheezing.  Abdominal:     General: Bowel sounds are normal. There is no distension.     Palpations: Abdomen is soft.  Musculoskeletal:        General: No deformity. Normal range of motion.     Cervical back: Normal range of motion and neck supple.  Lymphadenopathy:     Cervical: Cervical adenopathy present.  Skin:    General: Skin is warm and dry.     Findings: No erythema or rash.  Neurological:     Mental Status: She is alert and oriented to person, place, and time. Mental status is at baseline.     Cranial Nerves: No cranial nerve deficit.     Coordination: Coordination normal.  Psychiatric:        Mood and Affect: Mood normal.     LABORATORY DATA:  I have reviewed the data as listed Lab Results  Component Value Date   WBC 4.9 01/29/2020   HGB 12.5 01/29/2020   HCT 33.5 (L) 01/29/2020   MCV 98.8 01/29/2020   PLT 298 01/29/2020   Recent Labs    11/02/19 0803 11/07/19 1054  01/29/20 1738  NA 139 138 138  K 3.4* 3.9 3.6  CL 106 103 103  CO2 24 28 25   GLUCOSE 139* 155* 101*  BUN 19 24* 10  CREATININE 0.58 0.80 0.57  CALCIUM 8.3* 8.5* 9.4  GFRNONAA >60 >60 >60  GFRAA >60 >60 >60  PROT 6.6 6.7 7.9  ALBUMIN 3.6 3.6 3.9  AST 21 33 38  ALT 29 71* 27  ALKPHOS 76 76 73  BILITOT 0.4 0.5 0.6   Iron/TIBC/Ferritin/ %Sat No results found for: IRON, TIBC, FERRITIN, IRONPCTSAT    RADIOGRAPHIC STUDIES: I have personally reviewed the radiological images as listed and agreed with the findings in the report. [place] CORE BIOPSY (LYMPH NODES)  Result Date: 05/09/2020 INDICATION: 60 year old with history of cervical cancer with suspicious cervical and mediastinal lymph nodes on recent  PET-CT. EXAM: 1. ULTRASOUND-GUIDED FINE-NEEDLE ASPIRATION LEFT CERVICAL LYMPH NODE (LYMPH NODE #1) 2. ULTRASOUND-GUIDED ASPIRATION AND CORE BIOPSY OF LEFT CERVICAL LYMPH NODE (LYMPH NODE #2) MEDICATIONS: None. ANESTHESIA/SEDATION: None FLUOROSCOPY TIME:  None COMPLICATIONS: None immediate. PROCEDURE: Informed written consent was obtained from the patient and daughter after a thorough discussion of the procedural risks, benefits and alternatives. All questions were addressed. A timeout was performed prior to the initiation of the procedure. Left side of the neck was evaluated with ultrasound. Multiple small lymph nodes were identified. Left side of the neck was prepped with chlorhexidine. Skin was anesthetized using 1% lidocaine. Initially, a slightly lateral and deep left supraclavicular lymph node was sampled using fine-needle aspiration with ultrasound guidance and 25 gauge needles. This was labeled as lymph node #1. Two fine-needle aspirations were obtained from this lymph node. A more medial and slightly more superficial left supraclavicular lymph node was also biopsied using ultrasound-guided fine needle aspiration and labeled as lymph node #2. Both lymph nodes were evaluated by the cytology tech and  we decided to perform core biopsies of lymph node #2. Two core biopsies were obtained within the left supraclavicular lymph node with an 18 gauge core biopsy needle. Specimens placed in formalin. Patient was developing a small hematoma at the biopsy site and it was difficult to perform additional core biopsies. Therefore, 2 additional fine-needle aspirations were obtained in lymph node #2 using 25 gauge needles. Bandage placed over the puncture site. FINDINGS: Multiple small and slightly irregular hypoechoic lymph nodes in the left supraclavicular area. Two slightly irregular hypoechoic lymph nodes in the supraclavicular area were biopsied. The slightly lateral and deeper nodule was labeled as lymph node #1 and the more medial and superficial lymph node was labeled as #2. Patient developed a small hematoma during the procedure. The hematoma with stable and not expanding by the end of the procedure. IMPRESSION: 1. Ultrasound-guided fine-needle aspiration of two left supraclavicular lymph nodes. 2. Ultrasound-guided core biopsy of a left supraclavicular lymph node. Electronically Signed   By: Markus Daft M.D.   On: 05/09/2020 15:23   [place] FINE NEEDLE ASP 1ST LESION  Result Date: 05/09/2020 INDICATION: 60 year old with history of cervical cancer with suspicious cervical and mediastinal lymph nodes on recent PET-CT. EXAM: 1. ULTRASOUND-GUIDED FINE-NEEDLE ASPIRATION LEFT CERVICAL LYMPH NODE (LYMPH NODE #1) 2. ULTRASOUND-GUIDED ASPIRATION AND CORE BIOPSY OF LEFT CERVICAL LYMPH NODE (LYMPH NODE #2) MEDICATIONS: None. ANESTHESIA/SEDATION: None FLUOROSCOPY TIME:  None COMPLICATIONS: None immediate. PROCEDURE: Informed written consent was obtained from the patient and daughter after a thorough discussion of the procedural risks, benefits and alternatives. All questions were addressed. A timeout was performed prior to the initiation of the procedure. Left side of the neck was evaluated with ultrasound. Multiple small lymph  nodes were identified. Left side of the neck was prepped with chlorhexidine. Skin was anesthetized using 1% lidocaine. Initially, a slightly lateral and deep left supraclavicular lymph node was sampled using fine-needle aspiration with ultrasound guidance and 25 gauge needles. This was labeled as lymph node #1. Two fine-needle aspirations were obtained from this lymph node. A more medial and slightly more superficial left supraclavicular lymph node was also biopsied using ultrasound-guided fine needle aspiration and labeled as lymph node #2. Both lymph nodes were evaluated by the cytology tech and we decided to perform core biopsies of lymph node #2. Two core biopsies were obtained within the left supraclavicular lymph node with an 18 gauge core biopsy needle. Specimens placed in formalin. Patient was developing a  small hematoma at the biopsy site and it was difficult to perform additional core biopsies. Therefore, 2 additional fine-needle aspirations were obtained in lymph node #2 using 25 gauge needles. Bandage placed over the puncture site. FINDINGS: Multiple small and slightly irregular hypoechoic lymph nodes in the left supraclavicular area. Two slightly irregular hypoechoic lymph nodes in the supraclavicular area were biopsied. The slightly lateral and deeper nodule was labeled as lymph node #1 and the more medial and superficial lymph node was labeled as #2. Patient developed a small hematoma during the procedure. The hematoma with stable and not expanding by the end of the procedure. IMPRESSION: 1. Ultrasound-guided fine-needle aspiration of two left supraclavicular lymph nodes. 2. Ultrasound-guided core biopsy of a left supraclavicular lymph node. Electronically Signed   By: Markus Daft M.D.   On: 05/09/2020 15:23   [place] FINE NEEDLE ASP EA ADDL LESION  Result Date: 05/09/2020 INDICATION: 60 year old with history of cervical cancer with suspicious cervical and mediastinal lymph nodes on recent PET-CT. EXAM:  1. ULTRASOUND-GUIDED FINE-NEEDLE ASPIRATION LEFT CERVICAL LYMPH NODE (LYMPH NODE #1) 2. ULTRASOUND-GUIDED ASPIRATION AND CORE BIOPSY OF LEFT CERVICAL LYMPH NODE (LYMPH NODE #2) MEDICATIONS: None. ANESTHESIA/SEDATION: None FLUOROSCOPY TIME:  None COMPLICATIONS: None immediate. PROCEDURE: Informed written consent was obtained from the patient and daughter after a thorough discussion of the procedural risks, benefits and alternatives. All questions were addressed. A timeout was performed prior to the initiation of the procedure. Left side of the neck was evaluated with ultrasound. Multiple small lymph nodes were identified. Left side of the neck was prepped with chlorhexidine. Skin was anesthetized using 1% lidocaine. Initially, a slightly lateral and deep left supraclavicular lymph node was sampled using fine-needle aspiration with ultrasound guidance and 25 gauge needles. This was labeled as lymph node #1. Two fine-needle aspirations were obtained from this lymph node. A more medial and slightly more superficial left supraclavicular lymph node was also biopsied using ultrasound-guided fine needle aspiration and labeled as lymph node #2. Both lymph nodes were evaluated by the cytology tech and we decided to perform core biopsies of lymph node #2. Two core biopsies were obtained within the left supraclavicular lymph node with an 18 gauge core biopsy needle. Specimens placed in formalin. Patient was developing a small hematoma at the biopsy site and it was difficult to perform additional core biopsies. Therefore, 2 additional fine-needle aspirations were obtained in lymph node #2 using 25 gauge needles. Bandage placed over the puncture site. FINDINGS: Multiple small and slightly irregular hypoechoic lymph nodes in the left supraclavicular area. Two slightly irregular hypoechoic lymph nodes in the supraclavicular area were biopsied. The slightly lateral and deeper nodule was labeled as lymph node #1 and the more medial  and superficial lymph node was labeled as #2. Patient developed a small hematoma during the procedure. The hematoma with stable and not expanding by the end of the procedure. IMPRESSION: 1. Ultrasound-guided fine-needle aspiration of two left supraclavicular lymph nodes. 2. Ultrasound-guided core biopsy of a left supraclavicular lymph node. Electronically Signed   By: Markus Daft M.D.   On: 05/09/2020 15:23      ASSESSMENT & PLAN:  1. 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 ([place])   2. Goals of care, counseling/discussion   Cancer Staging Cervical cancer [facility]) Staging form: Cervix Uteri, AJCC Version 9 - Clinical stage from 09/11/2019: FIGO Stage IIIC2r, calculated as Stage IIIC2 (cT1b2, cN2, cM0) - Signed by Earlie Server, MD on 09/11/2019 - Pathologic stage from 05/20/2020: FIGO Stage IVB (pTX,  pNX, pM1) - Signed by Earlie Server, MD on 05/20/2020   #Metastatic cervix cancer, Images were independently reviewed by me and discussed with patient and daughter. Left supraclavicular lymph node biopsy pathology was reviewed and discussed. This is consistent with stage IV, metastatic cervix cancer with cervical, thoracic, retrocrural lymph node metastasis. I have discussed with Dr. Fransisca Connors and we agreed with the recommendation of systemic chemotherapy with carboplatin, Taxol, bevacizumab.  The diagnosis and care plan were discussed with patient in detail.  Patient was made aware that occurred patient is not curable.  The goal of treatment which is to palliate disease, disease related symptoms, improve quality of life and hopefully prolong life was highlighted in our discussion.  Chemotherapy education was provided.  We had discussed the composition of chemotherapy regimen, length of chemo cycle, duration of treatment and the time to assess response to treatment.    I explained to the patient the risks and benefits of chemotherapy carboplatin, Taxol, bevacizumab including all but not limited to allergic  reaction, hair loss, mouth sore, nausea, vomiting, diarrhea, low blood counts, bleeding, hypertension, heart attack, stroke, worsening of kidney function, neuropathy and risk of life threatening infection and even death, secondary malignancy etc. Patient voices understanding and willing to proceed chemotherapy.   # discussed with her about option of Medi- port placement.  Possible side effects of Mediport were discussed.  Patient would like to think about her options and update me if she prefers to proceed with Mediport placement.  Antiemetics-Zofran and Compazine; sent to pharmacy #Left lower extremity swelling, intermittent.  Previous ultrasound was negative for DVT.  We spent sufficient time to discuss many aspect of care, questions were answered to patient's satisfaction.Spanish interpreter was present for the entire encounter   All questions were answered. The patient knows to call the clinic with any problems questions or concerns.  Return of visit: 1 week  Earlie Server, MD, PhD Hematology Oncology [facility] Pager- [phone] 05/20/2020

## 2020-05-20 NOTE — Progress Notes (Signed)
Patient here for follow up. Pt reports having pain to left shoulder, started after she had biopsy done.

## 2020-05-21 ENCOUNTER — Other Ambulatory Visit: Payer: Self-pay | Admitting: Oncology

## 2020-05-22 ENCOUNTER — Other Ambulatory Visit: Payer: Self-pay

## 2020-05-22 DIAGNOSIS — C538 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 uteri: Secondary | ICD-10-CM

## 2020-05-23 ENCOUNTER — Inpatient Hospital Stay: Payer: BC Managed Care – PPO

## 2020-05-23 ENCOUNTER — Other Ambulatory Visit: Payer: Self-pay

## 2020-05-23 ENCOUNTER — Other Ambulatory Visit: Payer: BC Managed Care – PPO

## 2020-05-23 DIAGNOSIS — C538 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 uteri: Secondary | ICD-10-CM | POA: Diagnosis not present

## 2020-05-23 LAB — BASIC METABOLIC PANEL
Anion gap: 4 — ABNORMAL LOW (ref 5–15)
BUN: 15 mg/dL (ref 6–20)
CO2: 30 mmol/L (ref 22–32)
Calcium: 9.3 mg/dL (ref 8.9–10.3)
Chloride: 102 mmol/L (ref 98–111)
Creatinine, Ser: 0.63 mg/dL (ref 0.44–1.00)
GFR, Estimated: >60 mL/min (ref >60)
Glucose, Bld: 103 mg/dL — ABNORMAL HIGH (ref 70–99)
Potassium: 3.9 mmol/L (ref 3.5–5.1)
Sodium: 136 mmol/L (ref 135–145)

## 2020-05-23 NOTE — Progress Notes (Signed)
Tumor Board Documentation  Jill Rodriguez was presented by Dr Tasia Catchings at our Tumor Board on 05/23/2020, which included representatives from medical oncology,radiation oncology,internal medicine,navigation,pathology,radiology,surgical,pharmacy,genetics,palliative care,pulmonology.  Jill Rodriguez currently presents as a current patient,for MDC,for new positive pathology with history of the following treatments: neoadjuvant chemoradiation,active survellience.  Additionally, we reviewed previous medical and familial history, history of present illness, and recent lab results along with all available histopathologic and imaging studies. The tumor board considered available treatment options and made the following recommendations: Immunotherapy,Chemotherapy    The following procedures/referrals were also placed: No orders of the defined types were placed in this encounter.   Clinical Trial Status: not discussed   Staging used: AJCC Stage Group  AJCC Staging:       Group: Stage IV B Cervical Cancer with Metastatic disease to supraclavicular Lymph node   National site-specific guidelines NCCN were discussed with respect to the case.  Tumor board is a meeting of clinicians from various specialty areas who evaluate and discuss patients for whom a multidisciplinary approach is being considered. Final determinations in the plan of care are those of the provider(s). The responsibility for follow up of recommendations given during tumor board is that of the provider.   Today's extended care, comprehensive team conference, Jill Rodriguez was not present for the discussion and was not examined.   Multidisciplinary Tumor Board is a multidisciplinary case peer review process.  Decisions discussed in the Multidisciplinary Tumor Board reflect the opinions of the specialists present at the conference without having examined the patient.  Ultimately, treatment and diagnostic decisions rest with the primary provider(s) and  the patient.

## 2020-05-28 ENCOUNTER — Telehealth: Payer: Self-pay | Admitting: *Deleted

## 2020-05-28 NOTE — Telephone Encounter (Signed)
Jill Rodriguez will contact patient employer to fax FMLA over

## 2020-05-28 NOTE — Telephone Encounter (Signed)
Pt will need to talk to employer to get FMLA paperwork so we can fill out for continuous leave.

## 2020-05-28 NOTE — Telephone Encounter (Signed)
[name] called reporting that patient needs paperwork sent to her employer that Dr Tasia Catchings told her she cannot work when she starts chemotherapy.

## 2020-05-29 ENCOUNTER — Other Ambulatory Visit: Payer: Self-pay

## 2020-05-29 ENCOUNTER — Encounter: Payer: Self-pay | Admitting: Radiation Oncology

## 2020-05-29 ENCOUNTER — Ambulatory Visit
Admission: RE | Admit: 2020-05-29 | Discharge: 2020-05-29 | Disposition: A | Discharge status: Home / Self Care | Payer: BC Managed Care – PPO | Source: Ambulatory Visit | Attending: Radiation Oncology | Admitting: Radiation Oncology

## 2020-05-29 VITALS — Temp 98.3°F | Wt 182.4 lb

## 2020-05-29 DIAGNOSIS — C539 Malignant neoplasm of cervix uteri, unspecified: Secondary | ICD-10-CM

## 2020-05-29 NOTE — Progress Notes (Signed)
Radiation Oncology Follow up Note  Name: Jill Rodriguez   Date:   05/29/2020 MRN:  [id number] DOB: 1961/01/17    This 60 y.o. 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  She been doing well although repeat PET CT scan in October showed recurrent hypermetabolic disease in the left mediastinum.  More recently in December repeat PET CT scan showed subcentimeter hypermetabolic cervical mediastinal retrocrural lymph nodes concerning for lymphatic metastasis.  No hypermetabolic [place] was seen at the site of the cervix her known primary malignancy.  She developed more supraclavicular fullness and underwent biopsy of the left supraclavicular lymph node which was positive for malignancy with immunohistochemical markers consistent with known history of squamous cell carcinoma the cervix.  In consultation with Dr. Fransisca Connors she is proceeding with chemotherapy with carboplatinum Taxol and bevacizumab.  She is tolerating treatments well.  She is seen today is having some pain in the area of biopsy in the left supraclavicular lymph node apparently has a hematoma in that area.  COMPLICATIONS OF TREATMENT: none  FOLLOW UP COMPLIANCE: keeps appointments   PHYSICAL EXAM:  Temp 98.3 F (36.8 C) (Tympanic)   Wt 182 lb 6 oz (82.7 kg)   BMI 34.46 kg/m  Patient has fullness in left supraclavicular fossa.  Well-developed well-nourished patient in NAD. HEENT reveals PERLA, EOMI, discs not visualized.  Oral cavity is clear. No oral mucosal lesions are identified. Neck is clear without evidence of cervical or supraclavicular adenopathy. Lungs are clear to A&P. Cardiac examination is essentially  unremarkable with regular rate and rhythm without murmur rub or thrill. Abdomen is benign with no organomegaly or masses noted. Motor sensory and DTR levels are equal and symmetric in the upper and lower extremities. Cranial nerves II through XII are grossly intact. Proprioception is intact. No peripheral adenopathy or edema is identified. No motor or sensory levels are noted. Crude visual fields are within normal range.  RADIOLOGY RESULTS: Serial PET CT scans reviewed compatible with above-stated findings  PLAN: At this time she has progressive disease being treated with immunotherapy and chemotherapy by medical oncology.  I have suggested a cold compress for the area of hematoma in her left supraclavicular fossa.  At this time I will turn follow-up care over to medical oncology.  I would be happy to reevaluate the patient should palliative radiation therapy be indicated.  Daughter and patient both comprehend my recommendations well.  I would like to take this opportunity to thank you for allowing me to participate in the care of your patient.Jill Filbert, MD

## 2020-05-30 ENCOUNTER — Inpatient Hospital Stay (HOSPITAL_BASED_OUTPATIENT_CLINIC_OR_DEPARTMENT_OTHER): Payer: BC Managed Care – PPO | Admitting: Oncology

## 2020-05-30 ENCOUNTER — Inpatient Hospital Stay: Payer: BC Managed Care – PPO | Attending: Oncology

## 2020-05-30 ENCOUNTER — Encounter: Payer: Self-pay | Admitting: Oncology

## 2020-05-30 ENCOUNTER — Inpatient Hospital Stay: Payer: BC Managed Care – PPO

## 2020-05-30 VITALS — BP 124/66 | HR 65 | Temp 97.5°F | Resp 20

## 2020-05-30 VITALS — BP 146/66 | HR 86 | Temp 97.9°F | Resp 18 | Wt 180.4 lb

## 2020-05-30 DIAGNOSIS — Z5111 Encounter for antineoplastic chemotherapy: Secondary | ICD-10-CM | POA: Insufficient documentation

## 2020-05-30 DIAGNOSIS — D701 Agranulocytosis secondary to cancer chemotherapy: Secondary | ICD-10-CM | POA: Insufficient documentation

## 2020-05-30 DIAGNOSIS — C530 Malignant neoplasm of endocervix: Secondary | ICD-10-CM

## 2020-05-30 DIAGNOSIS — R197 Diarrhea, unspecified: Secondary | ICD-10-CM | POA: Insufficient documentation

## 2020-05-30 DIAGNOSIS — D649 Anemia, unspecified: Secondary | ICD-10-CM | POA: Insufficient documentation

## 2020-05-30 DIAGNOSIS — C538 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 uteri: Secondary | ICD-10-CM | POA: Insufficient documentation

## 2020-05-30 DIAGNOSIS — R112 Nausea with vomiting, unspecified: Secondary | ICD-10-CM | POA: Diagnosis not present

## 2020-05-30 DIAGNOSIS — Z5112 Encounter for antineoplastic immunotherapy: Secondary | ICD-10-CM | POA: Diagnosis not present

## 2020-05-30 DIAGNOSIS — T451X5A Adverse effect of antineoplastic and immunosuppressive drugs, initial encounter: Secondary | ICD-10-CM | POA: Diagnosis not present

## 2020-05-30 DIAGNOSIS — C539 Malignant neoplasm of cervix uteri, unspecified: Secondary | ICD-10-CM | POA: Diagnosis not present

## 2020-05-30 LAB — CBC WITH DIFFERENTIAL/PLATELET
Abs Immature Granulocytes: 0.01 10*3/uL (ref 0.00–0.07)
Basophils Absolute: 0 10*3/uL (ref 0.0–0.1)
Basophils Relative: 1 %
Eosinophils Absolute: 0.1 10*3/uL (ref 0.0–0.5)
Eosinophils Relative: 2 %
HCT: 34 % — ABNORMAL LOW (ref 36.0–46.0)
Hemoglobin: 11.6 g/dL — ABNORMAL LOW (ref 12.0–15.0)
Immature Granulocytes: 0 %
Lymphocytes Relative: 19 %
Lymphs Abs: 0.9 10*3/uL (ref 0.7–4.0)
MCH: 31.5 pg (ref 26.0–34.0)
MCHC: 34.1 g/dL (ref 30.0–36.0)
MCV: 92.4 fL (ref 80.0–100.0)
Monocytes Absolute: 0.5 10*3/uL (ref 0.1–1.0)
Monocytes Relative: 11 %
Neutro Abs: 3.4 10*3/uL (ref 1.7–7.7)
Neutrophils Relative %: 67 %
Platelets: 241 10*3/uL (ref 150–400)
RBC: 3.68 MIL/uL — ABNORMAL LOW (ref 3.87–5.11)
RDW: 13 % (ref 11.5–15.5)
WBC: 5 10*3/uL (ref 4.0–10.5)
nRBC: 0 % (ref 0.0–0.2)

## 2020-05-30 LAB — COMPREHENSIVE METABOLIC PANEL
ALT: 27 U/L (ref 0–44)
AST: 32 U/L (ref 15–41)
Albumin: 4 g/dL (ref 3.5–5.0)
Alkaline Phosphatase: 78 U/L (ref 38–126)
Anion gap: 8 (ref 5–15)
BUN: 15 mg/dL (ref 6–20)
CO2: 26 mmol/L (ref 22–32)
Calcium: 9 mg/dL (ref 8.9–10.3)
Chloride: 106 mmol/L (ref 98–111)
Creatinine, Ser: 0.81 mg/dL (ref 0.44–1.00)
GFR, Estimated: >60 mL/min (ref >60)
Glucose, Bld: 113 mg/dL — ABNORMAL HIGH (ref 70–99)
Potassium: 4 mmol/L (ref 3.5–5.1)
Sodium: 140 mmol/L (ref 135–145)
Total Bilirubin: 0.7 mg/dL (ref 0.3–1.2)
Total Protein: 7.5 g/dL (ref 6.5–8.1)

## 2020-05-30 LAB — PROTEIN, URINE, RANDOM: Total Protein, Urine: <6 mg/dL

## 2020-05-30 MED ORDER — SODIUM CHLORIDE 0.9 % IV SOLN
Freq: Once | INTRAVENOUS | Status: AC
  Filled 2020-05-30: qty 250

## 2020-05-30 MED ORDER — SODIUM CHLORIDE 0.9 % IV SOLN
150.0000 mg | Freq: Once | INTRAVENOUS | Status: AC
  Administered 2020-05-30: 150 mg via INTRAVENOUS
  Filled 2020-05-30: qty 5

## 2020-05-30 MED ORDER — SODIUM CHLORIDE 0.9 % IV SOLN
610.0000 mg | Freq: Once | INTRAVENOUS | Status: AC
  Administered 2020-05-30: 610 mg via INTRAVENOUS
  Filled 2020-05-30: qty 61

## 2020-05-30 MED ORDER — SODIUM CHLORIDE 0.9 % IV SOLN
10.0000 mg | Freq: Once | INTRAVENOUS | Status: AC
  Administered 2020-05-30: 10 mg via INTRAVENOUS
  Filled 2020-05-30: qty 10

## 2020-05-30 MED ORDER — SODIUM CHLORIDE 0.9 % IV SOLN
15.0000 mg/kg | Freq: Once | INTRAVENOUS | Status: AC
  Administered 2020-05-30: 1200 mg via INTRAVENOUS
  Filled 2020-05-30: qty 48

## 2020-05-30 MED ORDER — FAMOTIDINE IN NACL 20-0.9 MG/50ML-% IV SOLN
20.0000 mg | Freq: Once | INTRAVENOUS | Status: AC
  Administered 2020-05-30: 20 mg via INTRAVENOUS
  Filled 2020-05-30: qty 50

## 2020-05-30 MED ORDER — DIPHENHYDRAMINE HCL 50 MG/ML IJ SOLN
50.0000 mg | Freq: Once | INTRAMUSCULAR | Status: AC
  Administered 2020-05-30: 50 mg via INTRAVENOUS
  Filled 2020-05-30: qty 1

## 2020-05-30 MED ORDER — PALONOSETRON HCL INJECTION 0.25 MG/5ML
0.2500 mg | Freq: Once | INTRAVENOUS | Status: AC
  Administered 2020-05-30: 0.25 mg via INTRAVENOUS
  Filled 2020-05-30: qty 5

## 2020-05-30 MED ORDER — SODIUM CHLORIDE 0.9 % IV SOLN
135.0000 mg/m2 | Freq: Once | INTRAVENOUS | Status: AC
  Administered 2020-05-30: 252 mg via INTRAVENOUS
  Filled 2020-05-30: qty 42

## 2020-05-30 NOTE — Progress Notes (Signed)
Hematology/Oncology follow up note [facility] [phone] [phone]   Patient Care Team: Crissie Figures, PA-C as PCP - General (Physician Assistant) Clent Jacks, RN as Oncology Nurse Navigator  REFERRING PROVIDER: Crissie Figures, PA-C  CHIEF COMPLAINTS/REASON FOR VISIT:  Follow up for cervical cancer, treatment related symptoms.  HISTORY OF PRESENTING ILLNESS:   Jill Rodriguez is a  60 y.o.  female with PMH listed below was seen in consultation at the request of  Crissie Figures, PA-C  for evaluation of cervical cancer.   Patient is G3, P3 Spanish-speaking postmenopausal female who initially presented with vaginal bleeding.  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  No intervention seen.  08/23/2019, pelvis ultrasound showed endometrium measures 8 mm.  Echogenic endometrial microcalcification seen throughout endometrium.  Hypoechoic area in the fundal aspect of the endometrium measures 0.8 x 1.1 x 2.3 cm Survey of the adnexa showed no adnexal masses.  Patient has history of prominent right neck pelvis and CT angio neck has been ordered. Patient was seen by Dr. Fransisca Connors, clinically the tumor is 3 cm and replaced whole cervix. 09/06/2019 PET scan showed hypermetabolic mass involving the cervix and the lower uterine segment consistent with known cervical cancer. Multiple small hypermetabolic retroperitoneal and pelvic lymph node bilaterally consistent with nodal metastasis. She also has small hypermetabolic lymph nodes in the left superior mediastinum and left supraclavicular regions are also suspicious for nodal metastasis.  Hepatic steatosis no cholelithiasis noted.  At least Stage IIIc Cervix cancer, with small hypermetabolic lymph  nodes in the left superior mediastinum and left supraclavicular region which could put her to stage IV disease. hypermetabolic activity initially improved after concurrent chemoradiation.  # Cancer Treatment:  July 2021 Finished concurrent chemoradiation with weekly Cisplatin.   # 02/06/2020 repeat PET scan showed recurrent hypermetabolic left mediastinum lymph node.  No additional sites of recurrent hypermetabolic metastasis.  Residual mild hypermetabolism within the uterine cervix, slightly decreased.  2 small lung nodules with activity below PET resolution. Nonspecific persistent mild hypermetabolic 7 in the lower thoracic esophageal without discrete mass.  Diffuse hepatic steatosis, cholelithiasis atherosclerosis # She underwent EUS for further evaluation of esophageal hypermetabolic activity as well attempt to get biopsy, not feasible  #PET scan done at [facility], 04/03/2020, PET scan showed subcentimeter hypermetabolic cervical, mediastinal, retrocrural lymph nodes, concerning for lymphatic metastasis.  Multiple tiny indeterminate pulmonary nodule below the resolution of PET.  No abnormal FDG uptake within the cervix at the site of known primary malignancy. Patient has also developed a more supraclavicular fullness.  Patient was seen by Dr. Fransisca Connors on 05/01/2020.  Decision was made to proceed with biopsy of the left supraclavicular lymph node.   INTERVAL HISTORY Jill Rodriguez is a 60 y.o. female who has above history reviewed by me today presents for evaluation prior to chemotherapy for treatments of stage IV cervical cancer.  Problems and complaints are listed below: Patient is accompanied by her daughter. Spanish interpreter was present for the entire encounter Patient has picked up antiemetics.  No new complaints.  Review of Systems  Constitutional: Positive for fatigue. Negative for appetite change, chills and fever.  HENT:   Negative for hearing loss and voice change.   Eyes:  Negative for eye problems.  Respiratory: Negative for chest tightness and cough.   Cardiovascular: Negative  for chest pain.  Gastrointestinal: Negative for abdominal distention, abdominal pain, blood in stool, nausea and vomiting.  Endocrine: Negative for hot flashes.  Genitourinary: Negative for difficulty urinating and frequency.   Musculoskeletal: Negative for arthralgias.  Skin: Negative for itching and rash.  Neurological: Negative for extremity weakness.  Hematological: Negative for adenopathy.  Psychiatric/Behavioral: Negative for confusion.    MEDICAL HISTORY:  Past Medical History:  Diagnosis Date  . Cancer ([place])    cervical CA, that spread to lymph nodes; last chemo was 12/08/2019; radiation 01/2020  . COVID-19 10/2018  . Nausea with vomiting 10/31/2019  . No pertinent past medical history     SURGICAL HISTORY: Past Surgical History:  Procedure Laterality Date  . ESOPHAGOGASTRODUODENOSCOPY (EGD) WITH PROPOFOL N/A 03/28/2020   Procedure: ESOPHAGOGASTRODUODENOSCOPY (EGD) WITH PROPOFOL;  Surgeon: Milus Banister, MD;  Location: [facility] ENDOSCOPY;  Service: Endoscopy;  Laterality: N/A;  . EUS N/A 03/28/2020   Procedure: UPPER ENDOSCOPIC ULTRASOUND (EUS) RADIAL;  Surgeon: Milus Banister, MD;  Location: [facility] ENDOSCOPY;  Service: Endoscopy;  Laterality: N/A;  . no surgical history      SOCIAL HISTORY: Social History   Socioeconomic History  . Marital status: Married    Spouse name: Rosezetta Schlatter   . Number of children: 3  . Years of education: Not on file  . Highest education level: Not on file  Occupational History  . Not on file  Tobacco Use  . Smoking status: Never Smoker  . Smokeless tobacco: Never Used  Vaping Use  . Vaping Use: Never used  Substance and Sexual Activity  . Alcohol use: Never  . Drug use: Never  . Sexual activity: Yes    Birth control/protection: None  Other Topics Concern  . Not on file  Social History Narrative   Lives at home with spouse ;  daughter [name] comes & stays when needed   Social Determinants of Health   Financial Resource Strain: Not on file  Food Insecurity: Not on file  Transportation Needs: Not on file  Physical Activity: Not on file  Stress: Not on file  Social Connections: Not on file  Intimate Partner Violence: Not on file    FAMILY HISTORY: Family History  Problem Relation Age of Onset  . Blindness Mother   . Glaucoma Mother   . Diabetes Father   . Diabetes Brother     ALLERGIES:  has No Known Allergies.  MEDICATIONS:  Current Outpatient Medications  Medication Sig Dispense Refill  . traMADol (ULTRAM) 50 MG tablet Take 1 tablet (50 mg total) by mouth every 6 (six) hours as needed. 90 tablet 0  . ciprofloxacin (CIPRO) 500 MG tablet Take 1 tablet (500 mg total) by mouth 2 (two) times daily. (Patient not taking: No sig reported) 10 tablet 0  . dexamethasone (DECADRON) 4 MG tablet Take 2 tablets (8 mg total) by mouth See admin instructions. Take 8mg  daily for 2 days after each chemotherapy-cisplatin treatments. (Patient not taking: No sig reported) 24 tablet 0  . dexamethasone (DECADRON) 4 MG tablet Take 2 tablets (8 mg total) by mouth daily. Start the day after carboplatin chemotherapy for 3 days. (Patient not taking: Reported on 05/30/2020) 30 tablet 1  . nystatin (MYCOSTATIN) 100000 UNIT/ML suspension Use as directed 5 mLs (500,000 Units total) in the mouth or throat 4 (four) times daily. (Patient not taking: No sig reported) 120 mL 0  . OLANZapine (ZYPREXA) 10 MG tablet Take 1 tablet (10 mg total) by mouth at bedtime. (Patient not  taking: No sig reported) 30 tablet 0  . omeprazole (PRILOSEC) 20 MG capsule Take 1 capsule (20 mg total) by mouth daily. (Patient not taking: No sig reported) 30 capsule 1  . ondansetron (ZOFRAN ODT) 4 MG disintegrating tablet Take 1 tablet (4 mg total) by mouth every 8 (eight) hours as needed. (Patient not taking: No sig reported) 20 tablet 0  . ondansetron (ZOFRAN) 8 MG  tablet Take 1 tablet (8 mg total) by mouth 2 (two) times daily as needed for refractory nausea / vomiting. Start on day 3 after carboplatin chemo. (Patient not taking: Reported on 05/30/2020) 30 tablet 1  . prochlorperazine (COMPAZINE) 10 MG tablet Take 1 tablet (10 mg total) by mouth every 6 (six) hours as needed (Nausea or vomiting). (Patient not taking: Reported on 05/30/2020) 30 tablet 1  . promethazine (PHENERGAN) 25 MG suppository Place 1 suppository (25 mg total) rectally every 6 (six) hours as needed for nausea, vomiting or refractory nausea / vomiting. (Patient not taking: No sig reported) 12 each 1  . senna (SENOKOT) 8.6 MG TABS tablet Take 2 tablets (17.2 mg total) by mouth daily. (Patient not taking: No sig reported) 120 tablet 0   No current facility-administered medications for this visit.   Facility-Administered Medications Ordered in Other Visits  Medication Dose Route Frequency Provider Last Rate Last Admin  . CARBOplatin (PARAPLATIN) 610 mg in sodium chloride 0.9 % 250 mL chemo infusion  610 mg Intravenous Once Earlie Server, MD 622 mL/hr at 05/30/20 1649 610 mg at 05/30/20 1649     PHYSICAL EXAMINATION: ECOG PERFORMANCE STATUS: 1 - Symptomatic but completely ambulatory Vitals:   05/30/20 0940  BP: (!) 146/66  Pulse: 86  Resp: 18  Temp: 97.9 F (36.6 C)   Filed Weights   05/30/20 0940  Weight: 180 lb 6.4 oz (81.8 kg)    Physical Exam Constitutional:      General: She is not in acute distress. HENT:     Head: Normocephalic and atraumatic.  Eyes:     General: No scleral icterus. Cardiovascular:     Rate and Rhythm: Normal rate and regular rhythm.     Heart sounds: Normal heart sounds.  Pulmonary:     Effort: Pulmonary effort is normal. No respiratory distress.     Breath sounds: No wheezing.  Abdominal:     General: Bowel sounds are normal. There is no distension.     Palpations: Abdomen is soft.  Musculoskeletal:        General: No deformity. Normal range of  motion.     Cervical back: Normal range of motion and neck supple.  Lymphadenopathy:     Cervical: Cervical adenopathy present.  Skin:    General: Skin is warm and dry.     Findings: No erythema or rash.  Neurological:     Mental Status: She is alert and oriented to person, place, and time. Mental status is at baseline.     Cranial Nerves: No cranial nerve deficit.     Coordination: Coordination normal.  Psychiatric:        Mood and Affect: Mood normal.     LABORATORY DATA:  I have reviewed the data as listed Lab Results  Component Value Date   WBC 5.0 05/30/2020   HGB 11.6 (L) 05/30/2020   HCT 34.0 (L) 05/30/2020   MCV 92.4 05/30/2020   PLT 241 05/30/2020   Recent Labs    11/02/19 0803 11/07/19 1054 01/29/20 1738 05/23/20 1504 05/30/20 0845  NA 139  138 138 136 140  K 3.4* 3.9 3.6 3.9 4.0  CL 106 103 103 102 106  CO2 24 28 25 30 26   GLUCOSE 139* 155* 101* 103* 113*  BUN 19 24* 10 15 15   CREATININE 0.58 0.80 0.57 0.63 0.81  CALCIUM 8.3* 8.5* 9.4 9.3 9.0  GFRNONAA >60 >60 >60 >60 >60  GFRAA >60 >60 >60  --   --   PROT 6.6 6.7 7.9  --  7.5  ALBUMIN 3.6 3.6 3.9  --  4.0  AST 21 33 38  --  32  ALT 29 71* 27  --  27  ALKPHOS 76 76 73  --  78  BILITOT 0.4 0.5 0.6  --  0.7   Iron/TIBC/Ferritin/ %Sat No results found for: IRON, TIBC, FERRITIN, IRONPCTSAT    RADIOGRAPHIC STUDIES: I have personally reviewed the radiological images as listed and agreed with the findings in the report. [place] CORE BIOPSY (LYMPH NODES)  Result Date: 05/09/2020 INDICATION: 60 year old with history of cervical cancer with suspicious cervical and mediastinal lymph nodes on recent PET-CT. EXAM: 1. ULTRASOUND-GUIDED FINE-NEEDLE ASPIRATION LEFT CERVICAL LYMPH NODE (LYMPH NODE #1) 2. ULTRASOUND-GUIDED ASPIRATION AND CORE BIOPSY OF LEFT CERVICAL LYMPH NODE (LYMPH NODE #2) MEDICATIONS: None. ANESTHESIA/SEDATION: None FLUOROSCOPY TIME:  None COMPLICATIONS: None immediate. PROCEDURE: Informed written  consent was obtained from the patient and daughter after a thorough discussion of the procedural risks, benefits and alternatives. All questions were addressed. A timeout was performed prior to the initiation of the procedure. Left side of the neck was evaluated with ultrasound. Multiple small lymph nodes were identified. Left side of the neck was prepped with chlorhexidine. Skin was anesthetized using 1% lidocaine. Initially, a slightly lateral and deep left supraclavicular lymph node was sampled using fine-needle aspiration with ultrasound guidance and 25 gauge needles. This was labeled as lymph node #1. Two fine-needle aspirations were obtained from this lymph node. A more medial and slightly more superficial left supraclavicular lymph node was also biopsied using ultrasound-guided fine needle aspiration and labeled as lymph node #2. Both lymph nodes were evaluated by the cytology tech and we decided to perform core biopsies of lymph node #2. Two core biopsies were obtained within the left supraclavicular lymph node with an 18 gauge core biopsy needle. Specimens placed in formalin. Patient was developing a small hematoma at the biopsy site and it was difficult to perform additional core biopsies. Therefore, 2 additional fine-needle aspirations were obtained in lymph node #2 using 25 gauge needles. Bandage placed over the puncture site. FINDINGS: Multiple small and slightly irregular hypoechoic lymph nodes in the left supraclavicular area. Two slightly irregular hypoechoic lymph nodes in the supraclavicular area were biopsied. The slightly lateral and deeper nodule was labeled as lymph node #1 and the more medial and superficial lymph node was labeled as #2. Patient developed a small hematoma during the procedure. The hematoma with stable and not expanding by the end of the procedure. IMPRESSION: 1. Ultrasound-guided fine-needle aspiration of two left supraclavicular lymph nodes. 2. Ultrasound-guided core biopsy  of a left supraclavicular lymph node. Electronically Signed   By: Markus Daft M.D.   On: 05/09/2020 15:23   [place] FINE NEEDLE ASP 1ST LESION  Result Date: 05/09/2020 INDICATION: 60 year old with history of cervical cancer with suspicious cervical and mediastinal lymph nodes on recent PET-CT. EXAM: 1. ULTRASOUND-GUIDED FINE-NEEDLE ASPIRATION LEFT CERVICAL LYMPH NODE (LYMPH NODE #1) 2. ULTRASOUND-GUIDED ASPIRATION AND CORE BIOPSY OF LEFT CERVICAL LYMPH NODE (LYMPH NODE #2) MEDICATIONS: None. ANESTHESIA/SEDATION:  None FLUOROSCOPY TIME:  None COMPLICATIONS: None immediate. PROCEDURE: Informed written consent was obtained from the patient and daughter after a thorough discussion of the procedural risks, benefits and alternatives. All questions were addressed. A timeout was performed prior to the initiation of the procedure. Left side of the neck was evaluated with ultrasound. Multiple small lymph nodes were identified. Left side of the neck was prepped with chlorhexidine. Skin was anesthetized using 1% lidocaine. Initially, a slightly lateral and deep left supraclavicular lymph node was sampled using fine-needle aspiration with ultrasound guidance and 25 gauge needles. This was labeled as lymph node #1. Two fine-needle aspirations were obtained from this lymph node. A more medial and slightly more superficial left supraclavicular lymph node was also biopsied using ultrasound-guided fine needle aspiration and labeled as lymph node #2. Both lymph nodes were evaluated by the cytology tech and we decided to perform core biopsies of lymph node #2. Two core biopsies were obtained within the left supraclavicular lymph node with an 18 gauge core biopsy needle. Specimens placed in formalin. Patient was developing a small hematoma at the biopsy site and it was difficult to perform additional core biopsies. Therefore, 2 additional fine-needle aspirations were obtained in lymph node #2 using 25 gauge needles. Bandage placed over the  puncture site. FINDINGS: Multiple small and slightly irregular hypoechoic lymph nodes in the left supraclavicular area. Two slightly irregular hypoechoic lymph nodes in the supraclavicular area were biopsied. The slightly lateral and deeper nodule was labeled as lymph node #1 and the more medial and superficial lymph node was labeled as #2. Patient developed a small hematoma during the procedure. The hematoma with stable and not expanding by the end of the procedure. IMPRESSION: 1. Ultrasound-guided fine-needle aspiration of two left supraclavicular lymph nodes. 2. Ultrasound-guided core biopsy of a left supraclavicular lymph node. Electronically Signed   By: Markus Daft M.D.   On: 05/09/2020 15:23   [place] FINE NEEDLE ASP EA ADDL LESION  Result Date: 05/09/2020 INDICATION: 60 year old with history of cervical cancer with suspicious cervical and mediastinal lymph nodes on recent PET-CT. EXAM: 1. ULTRASOUND-GUIDED FINE-NEEDLE ASPIRATION LEFT CERVICAL LYMPH NODE (LYMPH NODE #1) 2. ULTRASOUND-GUIDED ASPIRATION AND CORE BIOPSY OF LEFT CERVICAL LYMPH NODE (LYMPH NODE #2) MEDICATIONS: None. ANESTHESIA/SEDATION: None FLUOROSCOPY TIME:  None COMPLICATIONS: None immediate. PROCEDURE: Informed written consent was obtained from the patient and daughter after a thorough discussion of the procedural risks, benefits and alternatives. All questions were addressed. A timeout was performed prior to the initiation of the procedure. Left side of the neck was evaluated with ultrasound. Multiple small lymph nodes were identified. Left side of the neck was prepped with chlorhexidine. Skin was anesthetized using 1% lidocaine. Initially, a slightly lateral and deep left supraclavicular lymph node was sampled using fine-needle aspiration with ultrasound guidance and 25 gauge needles. This was labeled as lymph node #1. Two fine-needle aspirations were obtained from this lymph node. A more medial and slightly more superficial left  supraclavicular lymph node was also biopsied using ultrasound-guided fine needle aspiration and labeled as lymph node #2. Both lymph nodes were evaluated by the cytology tech and we decided to perform core biopsies of lymph node #2. Two core biopsies were obtained within the left supraclavicular lymph node with an 18 gauge core biopsy needle. Specimens placed in formalin. Patient was developing a small hematoma at the biopsy site and it was difficult to perform additional core biopsies. Therefore, 2 additional fine-needle aspirations were obtained in lymph node #2 using 25 gauge  needles. Bandage placed over the puncture site. FINDINGS: Multiple small and slightly irregular hypoechoic lymph nodes in the left supraclavicular area. Two slightly irregular hypoechoic lymph nodes in the supraclavicular area were biopsied. The slightly lateral and deeper nodule was labeled as lymph node #1 and the more medial and superficial lymph node was labeled as #2. Patient developed a small hematoma during the procedure. The hematoma with stable and not expanding by the end of the procedure. IMPRESSION: 1. Ultrasound-guided fine-needle aspiration of two left supraclavicular lymph nodes. 2. Ultrasound-guided core biopsy of a left supraclavicular lymph node. Electronically Signed   By: Markus Daft M.D.   On: 05/09/2020 15:23      ASSESSMENT & PLAN:  1. Encounter for antineoplastic chemotherapy   2. Malignant neoplasm of cervix, unspecified site ([place])   3. Normocytic anemia   Cancer Staging Cervical cancer [facility]) Staging form: Cervix Uteri, AJCC Version 9 - Clinical stage from 09/11/2019: FIGO Stage IIIC2r, calculated as Stage IIIC2 (cT1b2, cN2, cM0) - Signed by Earlie Server, MD on 09/11/2019 - Pathologic stage from 05/20/2020: FIGO Stage IVB (pTX, pNX, pM1) - Signed by Earlie Server, MD on 05/20/2020  #Metastatic cervix cancer, Labs are reviewed and discussed with patient. Counts acceptable to proceed with cycle 1 carboplatin, Taxol,  bevacizumab. I reviewed the detailed instructions of antiemetics with patient in daughter.  #Normocytic anemia, hemoglobin 11.6.  Stable.  Monitor. We spent sufficient time to discuss many aspect of care, questions were answered to patient's satisfaction.Spanish interpreter was present for the entire encounter   All questions were answered. The patient knows to call the clinic with any problems questions or concerns.  Return of visit: 1 week lab MD IV fluid, evaluation tolerability.  Lab MD 3 weeks with carboplatin/Taxol/bevacizumab.  Earlie Server, MD, PhD Hematology Oncology [facility] Pager- [phone] 05/30/2020

## 2020-05-30 NOTE — Progress Notes (Signed)
Report received for C.Shropshire RN @ 1600. Remainder of paclitaxel and the carboplatin were well tolerated. Discharged home in stable condition.

## 2020-05-30 NOTE — Progress Notes (Signed)
Patient here for follow up and new treatment. No new concerns voiced.

## 2020-06-03 ENCOUNTER — Other Ambulatory Visit: Payer: Self-pay | Admitting: *Deleted

## 2020-06-03 DIAGNOSIS — C530 Malignant neoplasm of endocervix: Secondary | ICD-10-CM

## 2020-06-03 MED ORDER — PROCHLORPERAZINE MALEATE 10 MG PO TABS: 10.0000 mg | ORAL_TABLET | Freq: Four times a day (QID) | ORAL | 1 refills | Status: DC | PRN

## 2020-06-06 ENCOUNTER — Inpatient Hospital Stay: Payer: BC Managed Care – PPO

## 2020-06-06 ENCOUNTER — Encounter: Payer: Self-pay | Admitting: Oncology

## 2020-06-06 ENCOUNTER — Inpatient Hospital Stay (HOSPITAL_BASED_OUTPATIENT_CLINIC_OR_DEPARTMENT_OTHER): Payer: BC Managed Care – PPO | Admitting: Oncology

## 2020-06-06 VITALS — BP 156/70 | HR 98 | Temp 98.9°F | Resp 18 | Wt 176.5 lb

## 2020-06-06 DIAGNOSIS — Z5112 Encounter for antineoplastic immunotherapy: Secondary | ICD-10-CM | POA: Diagnosis not present

## 2020-06-06 DIAGNOSIS — D649 Anemia, unspecified: Secondary | ICD-10-CM

## 2020-06-06 DIAGNOSIS — R079 Chest pain, unspecified: Secondary | ICD-10-CM

## 2020-06-06 DIAGNOSIS — C538 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 uteri: Secondary | ICD-10-CM | POA: Diagnosis not present

## 2020-06-06 DIAGNOSIS — R112 Nausea with vomiting, unspecified: Secondary | ICD-10-CM | POA: Diagnosis not present

## 2020-06-06 DIAGNOSIS — R52 Pain, unspecified: Secondary | ICD-10-CM

## 2020-06-06 DIAGNOSIS — C530 Malignant neoplasm of endocervix: Secondary | ICD-10-CM

## 2020-06-06 LAB — CBC WITH DIFFERENTIAL/PLATELET
Abs Immature Granulocytes: 0.01 10*3/uL (ref 0.00–0.07)
Basophils Absolute: 0 10*3/uL (ref 0.0–0.1)
Basophils Relative: 0 %
Eosinophils Absolute: 0.1 10*3/uL (ref 0.0–0.5)
Eosinophils Relative: 4 %
HCT: 36.5 % (ref 36.0–46.0)
Hemoglobin: 12.4 g/dL (ref 12.0–15.0)
Immature Granulocytes: 0 %
Lymphocytes Relative: 25 %
Lymphs Abs: 0.7 10*3/uL (ref 0.7–4.0)
MCH: 30.9 pg (ref 26.0–34.0)
MCHC: 34 g/dL (ref 30.0–36.0)
MCV: 91 fL (ref 80.0–100.0)
Monocytes Absolute: 0.1 10*3/uL (ref 0.1–1.0)
Monocytes Relative: 2 %
Neutro Abs: 1.9 10*3/uL (ref 1.7–7.7)
Neutrophils Relative %: 69 %
Platelets: 164 10*3/uL (ref 150–400)
RBC: 4.01 MIL/uL (ref 3.87–5.11)
RDW: 12.6 % (ref 11.5–15.5)
Smear Review: NORMAL
WBC: 2.7 10*3/uL — ABNORMAL LOW (ref 4.0–10.5)
nRBC: 0 % (ref 0.0–0.2)

## 2020-06-06 LAB — COMPREHENSIVE METABOLIC PANEL
ALT: 44 U/L (ref 0–44)
AST: 34 U/L (ref 15–41)
Albumin: 3.6 g/dL (ref 3.5–5.0)
Alkaline Phosphatase: 75 U/L (ref 38–126)
Anion gap: 12 (ref 5–15)
BUN: 19 mg/dL (ref 6–20)
CO2: 25 mmol/L (ref 22–32)
Calcium: 8.7 mg/dL — ABNORMAL LOW (ref 8.9–10.3)
Chloride: 99 mmol/L (ref 98–111)
Creatinine, Ser: 0.8 mg/dL (ref 0.44–1.00)
GFR, Estimated: >60 mL/min (ref >60)
Glucose, Bld: 177 mg/dL — ABNORMAL HIGH (ref 70–99)
Potassium: 3.7 mmol/L (ref 3.5–5.1)
Sodium: 136 mmol/L (ref 135–145)
Total Bilirubin: 0.6 mg/dL (ref 0.3–1.2)
Total Protein: 7.2 g/dL (ref 6.5–8.1)

## 2020-06-06 MED ORDER — PROMETHAZINE HCL 25 MG RE SUPP: 25.0000 mg | Freq: Four times a day (QID) | RECTAL | 1 refills | Status: DC | PRN

## 2020-06-06 MED ORDER — PROMETHAZINE HCL 25 MG/ML IJ SOLN
25.0000 mg | Freq: Four times a day (QID) | INTRAMUSCULAR | Status: DC | PRN
  Administered 2020-06-06: 25 mg via INTRAVENOUS
  Filled 2020-06-06: qty 1

## 2020-06-06 MED ORDER — OLANZAPINE 10 MG PO TABS: 10.0000 mg | ORAL_TABLET | Freq: Every day | ORAL | 0 refills | Status: DC

## 2020-06-06 MED ORDER — SODIUM CHLORIDE 0.9 % IV SOLN
Freq: Once | INTRAVENOUS | Status: AC
  Filled 2020-06-06: qty 250

## 2020-06-06 NOTE — Progress Notes (Signed)
Patient here for follow up. Pt reports that she has had decreased appetite after finishing steroids, Vomitted x1. Pt has been having pain to left shoulder that radiates to low back, Tramadol helps but she does not like taking it to much. Patient having numbness to legs and tingling to fingers. New rash to right arm

## 2020-06-06 NOTE — Progress Notes (Signed)
Pt tolerated infusion well. Pt stable at discharge. 

## 2020-06-06 NOTE — Progress Notes (Signed)
Hematology/Oncology follow up note [facility] [phone] [phone]   Patient Care Team: Jill Figures, PA-C as PCP - General (Physician Assistant) Jill Jacks, RN as Oncology Nurse Navigator  REFERRING PROVIDER: Crissie Figures, PA-C  CHIEF COMPLAINTS/REASON FOR VISIT:  Follow up for cervical cancer, treatment related symptoms.  HISTORY OF PRESENTING ILLNESS:   Jill Rodriguez is a  60 y.o.  female with PMH listed below was seen in consultation at the request of  Jill Figures, PA-C  for evaluation of cervical cancer.   Patient is G3, P3 Spanish-speaking postmenopausal female who initially presented with vaginal bleeding.  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  No intervention seen.  08/23/2019, pelvis ultrasound showed endometrium measures 8 mm.  Echogenic endometrial microcalcification seen throughout endometrium.  Hypoechoic area in the fundal aspect of the endometrium measures 0.8 x 1.1 x 2.3 cm Survey of the adnexa showed no adnexal masses.  Patient has history of prominent right neck pelvis and CT angio neck has been ordered. Patient was seen by Dr. Fransisca Rodriguez, clinically the tumor is 3 cm and replaced whole cervix. 09/06/2019 PET scan showed hypermetabolic mass involving the cervix and the lower uterine segment consistent with known cervical cancer. Multiple small hypermetabolic retroperitoneal and pelvic lymph node bilaterally consistent with nodal metastasis. She also has small hypermetabolic lymph nodes in the left superior mediastinum and left supraclavicular regions are also suspicious for nodal metastasis.  Hepatic steatosis no cholelithiasis noted.  At least Stage IIIc Cervix cancer, with small hypermetabolic lymph  nodes in the left superior mediastinum and left supraclavicular region which could put her to stage IV disease. hypermetabolic activity initially improved after concurrent chemoradiation.  # Cancer Treatment:  July 2021 Finished concurrent chemoradiation with weekly Cisplatin.   # 02/06/2020 repeat PET scan showed recurrent hypermetabolic left mediastinum lymph node.  No additional sites of recurrent hypermetabolic metastasis.  Residual mild hypermetabolism within the uterine cervix, slightly decreased.  2 small lung nodules with activity below PET resolution. Nonspecific persistent mild hypermetabolic 7 in the lower thoracic esophageal without discrete mass.  Diffuse hepatic steatosis, cholelithiasis atherosclerosis # She underwent EUS for further evaluation of esophageal hypermetabolic activity as well attempt to get biopsy, not feasible  #PET scan done at [facility], 04/03/2020, PET scan showed subcentimeter hypermetabolic cervical, mediastinal, retrocrural lymph nodes, concerning for lymphatic metastasis.  Multiple tiny indeterminate pulmonary nodule below the resolution of PET.  No abnormal FDG uptake within the cervix at the site of known primary malignancy. Patient has also developed a more supraclavicular fullness.  Patient was seen by Dr. Fransisca Rodriguez on 05/01/2020.  Decision was made to proceed with biopsy of the left supraclavicular lymph node.   INTERVAL HISTORY Jill Rodriguez is a 60 y.o. female who has above history reviewed by me today presents for stage IV cervical cancer.  Problems and complaints are listed below: Patient is accompanied by her daughter. Patient has multiple complaints today. #Intermittent numbness and tingling of fingertips and legs.[name] Kitchen #Nausea, she vomited once.  Patient took dexamethasone for 3 days after chemotherapy and she felt well on those days.  Then the nausea started and she tried Zofran which did not help her symptoms also caused headache.  She tried Compazine  which did not work. #Intermittent chest pain radiated to left shoulder, she described it as  a sharp pounding pain.  Not associated with shortness of breath, nausea, vomiting.  She also reports feeling abdominal pain as if something squeezing her from inside. She has lost weight due to decreased appetite due to nausea.  Currently she does not have any chest pain or abdominal pain. Body aches, she takes tramadol as needed but try not to take too much.  Reports pain as 8 out of 10.  Review of Systems  Constitutional: Positive for appetite change, fatigue and unexpected weight change. Negative for chills and fever.  HENT:   Negative for hearing loss and voice change.   Eyes: Negative for eye problems.  Respiratory: Negative for chest tightness and cough.   Cardiovascular: Negative for chest pain.  Gastrointestinal: Negative for abdominal distention, abdominal pain, blood in stool, nausea and vomiting.  Endocrine: Negative for hot flashes.  Genitourinary: Negative for difficulty urinating and frequency.   Musculoskeletal: Negative for arthralgias.  Skin: Negative for itching and rash.  Neurological: Positive for numbness. Negative for extremity weakness.  Hematological: Negative for adenopathy.  Psychiatric/Behavioral: Negative for confusion.    MEDICAL HISTORY:  Past Medical History:  Diagnosis Date  . Cancer ([place])    cervical CA, that spread to lymph nodes; last chemo was 12/08/2019; radiation 01/2020  . COVID-19 10/2018  . Nausea with vomiting 10/31/2019  . No pertinent past medical history     SURGICAL HISTORY: Past Surgical History:  Procedure Laterality Date  . ESOPHAGOGASTRODUODENOSCOPY (EGD) WITH PROPOFOL N/A 03/28/2020   Procedure: ESOPHAGOGASTRODUODENOSCOPY (EGD) WITH PROPOFOL;  Surgeon: Jill Banister, MD;  Location: [facility] ENDOSCOPY;  Service: Endoscopy;  Laterality: N/A;  . EUS N/A 03/28/2020   Procedure: UPPER ENDOSCOPIC ULTRASOUND (EUS) RADIAL;  Surgeon: Jill Banister, MD;   Location: [facility] ENDOSCOPY;  Service: Endoscopy;  Laterality: N/A;  . no surgical history      SOCIAL HISTORY: Social History   Socioeconomic History  . Marital status: Married    Spouse name: Rosezetta Schlatter   . Number of children: 3  . Years of education: Not on file  . Highest education level: Not on file  Occupational History  . Not on file  Tobacco Use  . Smoking status: Never Smoker  . Smokeless tobacco: Never Used  Vaping Use  . Vaping Use: Never used  Substance and Sexual Activity  . Alcohol use: Never  . Drug use: Never  . Sexual activity: Yes    Birth control/protection: None  Other Topics Concern  . Not on file  Social History Narrative   Lives at home with spouse ; daughter [name] comes & stays when needed   Social Determinants of Health   Financial Resource Strain: Not on file  Food Insecurity: Not on file  Transportation Needs: Not on file  Physical Activity: Not on file  Stress: Not on file  Social Connections: Not on file  Intimate Partner Violence: Not on file    FAMILY HISTORY: Family History  Problem Relation Age of Onset  . Blindness Mother   . Glaucoma Mother   . Diabetes Father   . Diabetes Brother     ALLERGIES:  has No Known Allergies.  MEDICATIONS:  Current Outpatient Medications  Medication Sig Dispense Refill  . dexamethasone (DECADRON) 4 MG tablet Take 2 tablets (8 mg total) by mouth See admin instructions. Take 8mg  daily for 2 days after each chemotherapy-cisplatin treatments. 24 tablet 0  . ondansetron (ZOFRAN ODT) 4 MG disintegrating tablet Take 1 tablet (4 mg total) by mouth every 8 (  eight) hours as needed. 20 tablet 0  . prochlorperazine (COMPAZINE) 10 MG tablet Take 1 tablet (10 mg total) by mouth every 6 (six) hours as needed (Nausea or vomiting). 30 tablet 1  . traMADol (ULTRAM) 50 MG tablet Take 1 tablet (50 mg total) by mouth every 6 (six) hours as needed. 90 tablet 0  . ciprofloxacin (CIPRO) 500 MG tablet Take 1 tablet (500 mg  total) by mouth 2 (two) times daily. (Patient not taking: No sig reported) 10 tablet 0  . dexamethasone (DECADRON) 4 MG tablet Take 2 tablets (8 mg total) by mouth daily. Start the day after carboplatin chemotherapy for 3 days. (Patient not taking: No sig reported) 30 tablet 1  . nystatin (MYCOSTATIN) 100000 UNIT/ML suspension Use as directed 5 mLs (500,000 Units total) in the mouth or throat 4 (four) times daily. (Patient not taking: No sig reported) 120 mL 0  . OLANZapine (ZYPREXA) 10 MG tablet Take 1 tablet (10 mg total) by mouth at bedtime. 30 tablet 0  . omeprazole (PRILOSEC) 20 MG capsule Take 1 capsule (20 mg total) by mouth daily. (Patient not taking: No sig reported) 30 capsule 1  . ondansetron (ZOFRAN) 8 MG tablet Take 1 tablet (8 mg total) by mouth 2 (two) times daily as needed for refractory nausea / vomiting. Start on day 3 after carboplatin chemo. (Patient not taking: No sig reported) 30 tablet 1  . promethazine (PHENERGAN) 25 MG suppository Place 1 suppository (25 mg total) rectally every 6 (six) hours as needed for nausea, vomiting or refractory nausea / vomiting. 12 each 1  . senna (SENOKOT) 8.6 MG TABS tablet Take 2 tablets (17.2 mg total) by mouth daily. (Patient not taking: No sig reported) 120 tablet 0   No current facility-administered medications for this visit.   Facility-Administered Medications Ordered in Other Visits  Medication Dose Route Frequency Provider Last Rate Last Admin  . promethazine (PHENERGAN) injection 25 mg  25 mg Intravenous Q6H PRN Earlie Server, MD   25 mg at 06/06/20 1005     PHYSICAL EXAMINATION: ECOG PERFORMANCE STATUS: 1 - Symptomatic but completely ambulatory Vitals:   06/06/20 0919  BP: (!) 156/70  Pulse: 98  Resp: 18  Temp: 98.9 F (37.2 C)   Filed Weights   06/06/20 0919  Weight: 176 lb 8 oz (80.1 kg)    Physical Exam Constitutional:      General: She is not in acute distress. HENT:     Head: Normocephalic and atraumatic.  Eyes:      General: No scleral icterus. Cardiovascular:     Rate and Rhythm: Normal rate and regular rhythm.     Heart sounds: Normal heart sounds.  Pulmonary:     Effort: Pulmonary effort is normal. No respiratory distress.     Breath sounds: No wheezing.  Abdominal:     General: Bowel sounds are normal. There is no distension.     Palpations: Abdomen is soft.  Musculoskeletal:        General: No deformity. Normal range of motion.     Cervical back: Normal range of motion and neck supple.  Lymphadenopathy:     Cervical: Cervical adenopathy present.  Skin:    General: Skin is warm and dry.     Findings: No erythema or rash.  Neurological:     Mental Status: She is alert and oriented to person, place, and time. Mental status is at baseline.     Cranial Nerves: No cranial nerve deficit.  Coordination: Coordination normal.  Psychiatric:        Mood and Affect: Mood normal.     LABORATORY DATA:  I have reviewed the data as listed Lab Results  Component Value Date   WBC 2.7 (L) 06/06/2020   HGB 12.4 06/06/2020   HCT 36.5 06/06/2020   MCV 91.0 06/06/2020   PLT 164 06/06/2020   Recent Labs    11/02/19 0803 11/07/19 1054 01/29/20 1738 05/23/20 1504 05/30/20 0845 06/06/20 0850  NA 139 138 138 136 140 136  K 3.4* 3.9 3.6 3.9 4.0 3.7  CL 106 103 103 102 106 99  CO2 24 28 25 30 26 25   GLUCOSE 139* 155* 101* 103* 113* 177*  BUN 19 24* 10 15 15 19   CREATININE 0.58 0.80 0.57 0.63 0.81 0.80  CALCIUM 8.3* 8.5* 9.4 9.3 9.0 8.7*  GFRNONAA >60 >60 >60 >60 >60 >60  GFRAA >60 >60 >60  --   --   --   PROT 6.6 6.7 7.9  --  7.5 7.2  ALBUMIN 3.6 3.6 3.9  --  4.0 3.6  AST 21 33 38  --  32 34  ALT 29 71* 27  --  27 44  ALKPHOS 76 76 73  --  78 75  BILITOT 0.4 0.5 0.6  --  0.7 0.6   Iron/TIBC/Ferritin/ %Sat No results found for: IRON, TIBC, FERRITIN, IRONPCTSAT    RADIOGRAPHIC STUDIES: I have personally reviewed the radiological images as listed and agreed with the findings in the  report. [place] CORE BIOPSY (LYMPH NODES)  Result Date: 05/09/2020 INDICATION: 60 year old with history of cervical cancer with suspicious cervical and mediastinal lymph nodes on recent PET-CT. EXAM: 1. ULTRASOUND-GUIDED FINE-NEEDLE ASPIRATION LEFT CERVICAL LYMPH NODE (LYMPH NODE #1) 2. ULTRASOUND-GUIDED ASPIRATION AND CORE BIOPSY OF LEFT CERVICAL LYMPH NODE (LYMPH NODE #2) MEDICATIONS: None. ANESTHESIA/SEDATION: None FLUOROSCOPY TIME:  None COMPLICATIONS: None immediate. PROCEDURE: Informed written consent was obtained from the patient and daughter after a thorough discussion of the procedural risks, benefits and alternatives. All questions were addressed. A timeout was performed prior to the initiation of the procedure. Left side of the neck was evaluated with ultrasound. Multiple small lymph nodes were identified. Left side of the neck was prepped with chlorhexidine. Skin was anesthetized using 1% lidocaine. Initially, a slightly lateral and deep left supraclavicular lymph node was sampled using fine-needle aspiration with ultrasound guidance and 25 gauge needles. This was labeled as lymph node #1. Two fine-needle aspirations were obtained from this lymph node. A more medial and slightly more superficial left supraclavicular lymph node was also biopsied using ultrasound-guided fine needle aspiration and labeled as lymph node #2. Both lymph nodes were evaluated by the cytology tech and we decided to perform core biopsies of lymph node #2. Two core biopsies were obtained within the left supraclavicular lymph node with an 18 gauge core biopsy needle. Specimens placed in formalin. Patient was developing a small hematoma at the biopsy site and it was difficult to perform additional core biopsies. Therefore, 2 additional fine-needle aspirations were obtained in lymph node #2 using 25 gauge needles. Bandage placed over the puncture site. FINDINGS: Multiple small and slightly irregular hypoechoic lymph nodes in the left  supraclavicular area. Two slightly irregular hypoechoic lymph nodes in the supraclavicular area were biopsied. The slightly lateral and deeper nodule was labeled as lymph node #1 and the more medial and superficial lymph node was labeled as #2. Patient developed a small hematoma during the procedure. The hematoma with  stable and not expanding by the end of the procedure. IMPRESSION: 1. Ultrasound-guided fine-needle aspiration of two left supraclavicular lymph nodes. 2. Ultrasound-guided core biopsy of a left supraclavicular lymph node. Electronically Signed   By: Markus Daft M.D.   On: 05/09/2020 15:23   [place] FINE NEEDLE ASP 1ST LESION  Result Date: 05/09/2020 INDICATION: 60 year old with history of cervical cancer with suspicious cervical and mediastinal lymph nodes on recent PET-CT. EXAM: 1. ULTRASOUND-GUIDED FINE-NEEDLE ASPIRATION LEFT CERVICAL LYMPH NODE (LYMPH NODE #1) 2. ULTRASOUND-GUIDED ASPIRATION AND CORE BIOPSY OF LEFT CERVICAL LYMPH NODE (LYMPH NODE #2) MEDICATIONS: None. ANESTHESIA/SEDATION: None FLUOROSCOPY TIME:  None COMPLICATIONS: None immediate. PROCEDURE: Informed written consent was obtained from the patient and daughter after a thorough discussion of the procedural risks, benefits and alternatives. All questions were addressed. A timeout was performed prior to the initiation of the procedure. Left side of the neck was evaluated with ultrasound. Multiple small lymph nodes were identified. Left side of the neck was prepped with chlorhexidine. Skin was anesthetized using 1% lidocaine. Initially, a slightly lateral and deep left supraclavicular lymph node was sampled using fine-needle aspiration with ultrasound guidance and 25 gauge needles. This was labeled as lymph node #1. Two fine-needle aspirations were obtained from this lymph node. A more medial and slightly more superficial left supraclavicular lymph node was also biopsied using ultrasound-guided fine needle aspiration and labeled as lymph  node #2. Both lymph nodes were evaluated by the cytology tech and we decided to perform core biopsies of lymph node #2. Two core biopsies were obtained within the left supraclavicular lymph node with an 18 gauge core biopsy needle. Specimens placed in formalin. Patient was developing a small hematoma at the biopsy site and it was difficult to perform additional core biopsies. Therefore, 2 additional fine-needle aspirations were obtained in lymph node #2 using 25 gauge needles. Bandage placed over the puncture site. FINDINGS: Multiple small and slightly irregular hypoechoic lymph nodes in the left supraclavicular area. Two slightly irregular hypoechoic lymph nodes in the supraclavicular area were biopsied. The slightly lateral and deeper nodule was labeled as lymph node #1 and the more medial and superficial lymph node was labeled as #2. Patient developed a small hematoma during the procedure. The hematoma with stable and not expanding by the end of the procedure. IMPRESSION: 1. Ultrasound-guided fine-needle aspiration of two left supraclavicular lymph nodes. 2. Ultrasound-guided core biopsy of a left supraclavicular lymph node. Electronically Signed   By: Markus Daft M.D.   On: 05/09/2020 15:23   [place] FINE NEEDLE ASP EA ADDL LESION  Result Date: 05/09/2020 INDICATION: 60 year old with history of cervical cancer with suspicious cervical and mediastinal lymph nodes on recent PET-CT. EXAM: 1. ULTRASOUND-GUIDED FINE-NEEDLE ASPIRATION LEFT CERVICAL LYMPH NODE (LYMPH NODE #1) 2. ULTRASOUND-GUIDED ASPIRATION AND CORE BIOPSY OF LEFT CERVICAL LYMPH NODE (LYMPH NODE #2) MEDICATIONS: None. ANESTHESIA/SEDATION: None FLUOROSCOPY TIME:  None COMPLICATIONS: None immediate. PROCEDURE: Informed written consent was obtained from the patient and daughter after a thorough discussion of the procedural risks, benefits and alternatives. All questions were addressed. A timeout was performed prior to the initiation of the procedure. Left  side of the neck was evaluated with ultrasound. Multiple small lymph nodes were identified. Left side of the neck was prepped with chlorhexidine. Skin was anesthetized using 1% lidocaine. Initially, a slightly lateral and deep left supraclavicular lymph node was sampled using fine-needle aspiration with ultrasound guidance and 25 gauge needles. This was labeled as lymph node #1. Two fine-needle aspirations were obtained from  this lymph node. A more medial and slightly more superficial left supraclavicular lymph node was also biopsied using ultrasound-guided fine needle aspiration and labeled as lymph node #2. Both lymph nodes were evaluated by the cytology tech and we decided to perform core biopsies of lymph node #2. Two core biopsies were obtained within the left supraclavicular lymph node with an 18 gauge core biopsy needle. Specimens placed in formalin. Patient was developing a small hematoma at the biopsy site and it was difficult to perform additional core biopsies. Therefore, 2 additional fine-needle aspirations were obtained in lymph node #2 using 25 gauge needles. Bandage placed over the puncture site. FINDINGS: Multiple small and slightly irregular hypoechoic lymph nodes in the left supraclavicular area. Two slightly irregular hypoechoic lymph nodes in the supraclavicular area were biopsied. The slightly lateral and deeper nodule was labeled as lymph node #1 and the more medial and superficial lymph node was labeled as #2. Patient developed a small hematoma during the procedure. The hematoma with stable and not expanding by the end of the procedure. IMPRESSION: 1. Ultrasound-guided fine-needle aspiration of two left supraclavicular lymph nodes. 2. Ultrasound-guided core biopsy of a left supraclavicular lymph node. Electronically Signed   By: Markus Daft M.D.   On: 05/09/2020 15:23      ASSESSMENT & PLAN:  1. 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 ([place])   2. Normocytic anemia   3. Chest  pain, unspecified type   4. Non-intractable vomiting with nausea, unspecified vomiting type   5. Body aches   Cancer Staging Cervical cancer [facility]) Staging form: Cervix Uteri, AJCC Version 9 - Clinical stage from 09/11/2019: FIGO Stage IIIC2r, calculated as Stage IIIC2 (cT1b2, cN2, cM0) - Signed by Earlie Server, MD on 09/11/2019 - Pathologic stage from 05/20/2020: FIGO Stage IVB (pTX, pNX, pM1) - Signed by Earlie Server, MD on 05/20/2020  #Metastatic cervix cancer, currently on first-line chemotherapy treatment with carboplatin, Taxol, bevacizumab. Labs are reviewed and discussed with patient. She tolerated treatment with moderate difficulties.  #Chemotherapy-induced nausea/vomiting, refractory to oral Zofran and Compazine. Patient will receive IV Phenergan 25 mg x 1.  IV hydration normal saline 1 L x 1. Recommend patient to restart on olanzapine 10 mg at bedtime and Phenergan 25 mg suppository every 6 hours as needed.  #Normocytic anemia, hemoglobin normal today.  Monitor #Intermittent chest pain radiated left shoulder and back.  Currently she has no pain. She has risk factors including obesity, on treatment with bevacizumab.  I will send patient to establish care with cardiology for further evaluation and rule out cardiology etiology of her intermittent chest pain.  #Body aches, recommend patient to utilize Tylenol and tramadol. #Follow-up in 1 week for evaluation of symptoms.  We spent sufficient time to discuss many aspect of care, questions were answered to patient's satisfaction.Spanish interpreter was present for the entire encounter   All questions were answered. The patient knows to call the clinic with any problems questions or concerns.  Return of visit: 1 week lab MD IV fluid, evaluation tolerability.  Lab MD 3 weeks with carboplatin/Taxol/bevacizumab.  Earlie Server, MD, PhD Hematology Oncology [facility] Pager- [phone] 06/06/2020

## 2020-06-14 ENCOUNTER — Inpatient Hospital Stay (HOSPITAL_BASED_OUTPATIENT_CLINIC_OR_DEPARTMENT_OTHER): Payer: BC Managed Care – PPO | Admitting: Oncology

## 2020-06-14 ENCOUNTER — Encounter: Payer: Self-pay | Admitting: Oncology

## 2020-06-14 ENCOUNTER — Inpatient Hospital Stay: Payer: BC Managed Care – PPO

## 2020-06-14 ENCOUNTER — Other Ambulatory Visit: Payer: Self-pay

## 2020-06-14 VITALS — BP 128/68 | HR 100 | Temp 97.0°F | Resp 18 | Wt 179.2 lb

## 2020-06-14 DIAGNOSIS — R112 Nausea with vomiting, unspecified: Secondary | ICD-10-CM

## 2020-06-14 DIAGNOSIS — C539 Malignant neoplasm of cervix uteri, unspecified: Secondary | ICD-10-CM

## 2020-06-14 DIAGNOSIS — D701 Agranulocytosis secondary to cancer chemotherapy: Secondary | ICD-10-CM | POA: Diagnosis not present

## 2020-06-14 DIAGNOSIS — Z5112 Encounter for antineoplastic immunotherapy: Secondary | ICD-10-CM | POA: Diagnosis not present

## 2020-06-14 DIAGNOSIS — C530 Malignant neoplasm of endocervix: Secondary | ICD-10-CM

## 2020-06-14 DIAGNOSIS — T451X5A Adverse effect of antineoplastic and immunosuppressive drugs, initial encounter: Secondary | ICD-10-CM | POA: Insufficient documentation

## 2020-06-14 DIAGNOSIS — D649 Anemia, unspecified: Secondary | ICD-10-CM

## 2020-06-14 LAB — COMPREHENSIVE METABOLIC PANEL
ALT: 28 U/L (ref 0–44)
AST: 22 U/L (ref 15–41)
Albumin: 3.7 g/dL (ref 3.5–5.0)
Alkaline Phosphatase: 91 U/L (ref 38–126)
Anion gap: 11 (ref 5–15)
BUN: 15 mg/dL (ref 6–20)
CO2: 23 mmol/L (ref 22–32)
Calcium: 8.5 mg/dL — ABNORMAL LOW (ref 8.9–10.3)
Chloride: 104 mmol/L (ref 98–111)
Creatinine, Ser: 0.65 mg/dL (ref 0.44–1.00)
GFR, Estimated: >60 mL/min (ref >60)
Glucose, Bld: 140 mg/dL — ABNORMAL HIGH (ref 70–99)
Potassium: 3.8 mmol/L (ref 3.5–5.1)
Sodium: 138 mmol/L (ref 135–145)
Total Bilirubin: 0.4 mg/dL (ref 0.3–1.2)
Total Protein: 7.2 g/dL (ref 6.5–8.1)

## 2020-06-14 LAB — CBC WITH DIFFERENTIAL/PLATELET
Abs Immature Granulocytes: 0.01 10*3/uL (ref 0.00–0.07)
Basophils Absolute: 0 10*3/uL (ref 0.0–0.1)
Basophils Relative: 0 %
Eosinophils Absolute: 0.1 10*3/uL (ref 0.0–0.5)
Eosinophils Relative: 2 %
HCT: 34.2 % — ABNORMAL LOW (ref 36.0–46.0)
Hemoglobin: 11.3 g/dL — ABNORMAL LOW (ref 12.0–15.0)
Immature Granulocytes: 0 %
Lymphocytes Relative: 33 %
Lymphs Abs: 0.8 10*3/uL (ref 0.7–4.0)
MCH: 31 pg (ref 26.0–34.0)
MCHC: 33 g/dL (ref 30.0–36.0)
MCV: 94 fL (ref 80.0–100.0)
Monocytes Absolute: 0.5 10*3/uL (ref 0.1–1.0)
Monocytes Relative: 19 %
Neutro Abs: 1.1 10*3/uL — ABNORMAL LOW (ref 1.7–7.7)
Neutrophils Relative %: 46 %
Platelets: 214 10*3/uL (ref 150–400)
RBC: 3.64 MIL/uL — ABNORMAL LOW (ref 3.87–5.11)
RDW: 12.7 % (ref 11.5–15.5)
WBC: 2.4 10*3/uL — ABNORMAL LOW (ref 4.0–10.5)
nRBC: 0 % (ref 0.0–0.2)

## 2020-06-14 MED ORDER — SODIUM CHLORIDE 0.9 % IV SOLN
Freq: Once | INTRAVENOUS | Status: AC
  Filled 2020-06-14: qty 250

## 2020-06-14 MED ORDER — PROMETHAZINE HCL 25 MG RE SUPP: 25.0000 mg | Freq: Four times a day (QID) | RECTAL | 1 refills | Status: DC | PRN

## 2020-06-14 NOTE — Progress Notes (Signed)
Patient here for follow up. She reports having some diarrhea for about 2 weeks. She vomitted last night and is feeling some weakness. Pt reports that she had a nosebleed and some scalp pain with hair loss.

## 2020-06-14 NOTE — Progress Notes (Signed)
Hematology/Oncology follow up note [facility] Telephone:(336) [phone] Fax:(336) [phone]   Patient Care Team: Crissie Figures, PA-C as PCP - General (Physician Assistant) Clent Jacks, RN as Oncology Nurse Navigator  REFERRING PROVIDER: Crissie Figures, PA-C  CHIEF COMPLAINTS/REASON FOR VISIT:  Follow up for cervical cancer, treatment related symptoms.  HISTORY OF PRESENTING ILLNESS:   Jill Rodriguez is a  60 y.o.  female with PMH listed below was seen in consultation at the request of  Crissie Figures, PA-C  for evaluation of cervical cancer.   Patient is G3, P3 Spanish-speaking postmenopausal female who initially presented with vaginal bleeding.  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  No intervention seen.  08/23/2019, pelvis ultrasound showed endometrium measures 8 mm.  Echogenic endometrial microcalcification seen throughout endometrium.  Hypoechoic area in the fundal aspect of the endometrium measures 0.8 x 1.1 x 2.3 cm Survey of the adnexa showed no adnexal masses.  Patient has history of prominent right neck pelvis and CT angio neck has been ordered. Patient was seen by Dr. Fransisca Connors, clinically the tumor is 3 cm and replaced whole cervix. 09/06/2019 PET scan showed hypermetabolic mass involving the cervix and the lower uterine segment consistent with known cervical cancer. Multiple small hypermetabolic retroperitoneal and pelvic lymph node bilaterally consistent with nodal metastasis. She also has small hypermetabolic lymph nodes in the left superior mediastinum and left supraclavicular regions are also suspicious for nodal metastasis.  Hepatic steatosis no cholelithiasis noted.  At least Stage IIIc Cervix cancer, with small hypermetabolic lymph  nodes in the left superior mediastinum and left supraclavicular region which could put her to stage IV disease. hypermetabolic activity initially improved after concurrent chemoradiation.  # Cancer Treatment:  July 2021 Finished concurrent chemoradiation with weekly Cisplatin.   # 02/06/2020 repeat PET scan showed recurrent hypermetabolic left mediastinum lymph node.  No additional sites of recurrent hypermetabolic metastasis.  Residual mild hypermetabolism within the uterine cervix, slightly decreased.  2 small lung nodules with activity below PET resolution. Nonspecific persistent mild hypermetabolic 7 in the lower thoracic esophageal without discrete mass.  Diffuse hepatic steatosis, cholelithiasis atherosclerosis # She underwent EUS for further evaluation of esophageal hypermetabolic activity as well attempt to get biopsy, not feasible  #[facility] scan done [facility], 04/03/2020, PET scan showed subcentimeter hypermetabolic cervical, mediastinal, retrocrural lymph nodes, concerning for lymphatic metastasis.  Multiple tiny indeterminate pulmonary nodule below the resolution of PET.  No abnormal FDG uptake within the cervix at the site of known primary malignancy. Patient has also developed a more supraclavicular fullness.  Patient was seen by Dr. Fransisca Connors on 05/01/2020.  Decision was made to proceed with biopsy of the left supraclavicular lymph node.   INTERVAL HISTORY Jill Rodriguez is a 60 y.o. female who has above history reviewed by me today presents for stage IV cervical cancer.  Problems and complaints are listed below: Patient is accompanied by her daughter. Patient has multiple complaints today. #Intermittent numbness and tingling of fingertips and legs.[name] Kitchen #Nausea vomiting, she takes dexamethasone for 3 days after chemotherapy which helped her symptoms.  She has tried olanzapine nightly as well as phenergan suppository and feels the latter helps more.  Request refused. Intermittent  diarrhea. .  Review of Systems  Constitutional: Positive for appetite change, fatigue and unexpected weight change. Negative for chills and fever.  HENT:   Negative for hearing loss and voice change.   Eyes: Negative for eye problems.  Respiratory: Negative for chest tightness and cough.   Cardiovascular: Negative for chest pain.  Gastrointestinal: Negative for abdominal distention, abdominal pain, blood in stool, nausea and vomiting.  Endocrine: Negative for hot flashes.  Genitourinary: Negative for difficulty urinating and frequency.   Musculoskeletal: Negative for arthralgias.  Skin: Negative for itching and rash.  Neurological: Positive for numbness. Negative for extremity weakness.  Hematological: Negative for adenopathy.  Psychiatric/Behavioral: Negative for confusion.    MEDICAL HISTORY:  Past Medical History:  Diagnosis Date  . Cancer ([place])    cervical CA, that spread to lymph nodes; last chemo was 12/08/2019; radiation 01/2020  . COVID-19 10/2018  . Nausea with vomiting 10/31/2019  . No pertinent past medical history     SURGICAL HISTORY: Past Surgical History:  Procedure Laterality Date  . ESOPHAGOGASTRODUODENOSCOPY (EGD) WITH PROPOFOL N/A 03/28/2020   Procedure: ESOPHAGOGASTRODUODENOSCOPY (EGD) WITH PROPOFOL;  Surgeon: Milus Banister, MD;  Location: [facility] ENDOSCOPY;  Service: Endoscopy;  Laterality: N/A;  . EUS N/A 03/28/2020   Procedure: UPPER ENDOSCOPIC ULTRASOUND (EUS) RADIAL;  Surgeon: Milus Banister, MD;  Location: [facility] ENDOSCOPY;  Service: Endoscopy;  Laterality: N/A;  . no surgical history      SOCIAL HISTORY: Social History   Socioeconomic History  . Marital status: Married    Spouse name: Rosezetta Schlatter   . Number of children: 3  . Years of education: Not on file  . Highest education level: Not on file  Occupational History  . Not on file  Tobacco Use  . Smoking status: Never Smoker  . Smokeless tobacco: Never Used  Vaping Use  . Vaping Use: Never used   Substance and Sexual Activity  . Alcohol use: Never  . Drug use: Never  . Sexual activity: Yes    Birth control/protection: None  Other Topics Concern  . Not on file  Social History Narrative   Lives at home with spouse ; daughter [name] comes & stays when needed   Social Determinants of Health   Financial Resource Strain: Not on file  Food Insecurity: Not on file  Transportation Needs: Not on file  Physical Activity: Not on file  Stress: Not on file  Social Connections: Not on file  Intimate Partner Violence: Not on file    FAMILY HISTORY: Family History  Problem Relation Age of Onset  . Blindness Mother   . Glaucoma Mother   . Diabetes Father   . Diabetes Brother     ALLERGIES:  has No Known Allergies.  MEDICATIONS:  Current Outpatient Medications  Medication Sig Dispense Refill  . dexamethasone (DECADRON) 4 MG tablet Take 2 tablets (8 mg total) by mouth See admin instructions. Take 8mg  daily for 2 days after each chemotherapy-cisplatin treatments. 24 tablet 0  . ondansetron (ZOFRAN ODT) 4 MG disintegrating tablet Take 1 tablet (4 mg total) by mouth every 8 (eight) hours as needed. 20 tablet 0  . prochlorperazine (COMPAZINE) 10 MG tablet Take 1 tablet (10 mg total) by mouth every 6 (six) hours as needed (Nausea or vomiting). 30 tablet 1  . dexamethasone (DECADRON) 4 MG tablet Take 2 tablets (8 mg total) by mouth daily. Start the day after carboplatin chemotherapy for 3 days. (Patient not taking: No sig reported) 30 tablet 1  . nystatin (MYCOSTATIN) 100000 UNIT/ML suspension Use as directed 5 mLs (500,000 Units total) in the mouth or throat 4 (four) times daily. (Patient  not taking: No sig reported) 120 mL 0  . OLANZapine (ZYPREXA) 10 MG tablet Take 1 tablet (10 mg total) by mouth at bedtime. (Patient not taking: Reported on 06/14/2020) 30 tablet 0  . omeprazole (PRILOSEC) 20 MG capsule Take 1 capsule (20 mg total) by mouth daily. (Patient not taking: No sig reported) 30  capsule 1  . ondansetron (ZOFRAN) 8 MG tablet Take 1 tablet (8 mg total) by mouth 2 (two) times daily as needed for refractory nausea / vomiting. Start on day 3 after carboplatin chemo. (Patient not taking: No sig reported) 30 tablet 1  . promethazine (PHENERGAN) 25 MG suppository Place 1 suppository (25 mg total) rectally every 6 (six) hours as needed for nausea, vomiting or refractory nausea / vomiting. 30 each 1  . senna (SENOKOT) 8.6 MG TABS tablet Take 2 tablets (17.2 mg total) by mouth daily. (Patient not taking: No sig reported) 120 tablet 0  . traMADol (ULTRAM) 50 MG tablet Take 1 tablet (50 mg total) by mouth every 6 (six) hours as needed. (Patient not taking: Reported on 06/14/2020) 90 tablet 0   No current facility-administered medications for this visit.     PHYSICAL EXAMINATION: ECOG PERFORMANCE STATUS: 1 - Symptomatic but completely ambulatory Vitals:   06/14/20 1319  BP: 128/68  Pulse: 100  Resp: 18  Temp: (!) 97 F (36.1 C)   Filed Weights   06/14/20 1319  Weight: 179 lb 3.2 oz (81.3 kg)    Physical Exam Constitutional:      General: She is not in acute distress. HENT:     Head: Normocephalic and atraumatic.  Eyes:     General: No scleral icterus. Cardiovascular:     Rate and Rhythm: Normal rate and regular rhythm.     Heart sounds: Normal heart sounds.  Pulmonary:     Effort: Pulmonary effort is normal. No respiratory distress.     Breath sounds: No wheezing.  Abdominal:     General: Bowel sounds are normal. There is no distension.     Palpations: Abdomen is soft.  Musculoskeletal:        General: No deformity. Normal range of motion.     Cervical back: Normal range of motion and neck supple.  Lymphadenopathy:     Cervical: Cervical adenopathy present.  Skin:    General: Skin is warm and dry.     Findings: No erythema or rash.  Neurological:     Mental Status: She is alert and oriented to person, place, and time. Mental status is at baseline.      Cranial Nerves: No cranial nerve deficit.     Coordination: Coordination normal.  Psychiatric:        Mood and Affect: Mood normal.     LABORATORY DATA:  I have reviewed the data as listed Lab Results  Component Value Date   WBC 2.4 (L) 06/14/2020   HGB 11.3 (L) 06/14/2020   HCT 34.2 (L) 06/14/2020   MCV 94.0 06/14/2020   PLT 214 06/14/2020   Recent Labs    11/02/19 0803 11/07/19 1054 01/29/20 1738 05/23/20 1504 05/30/20 0845 06/06/20 0850 06/14/20 1254  NA 139 138 138   < > 140 136 138  K 3.4* 3.9 3.6   < > 4.0 3.7 3.8  CL 106 103 103   < > 106 99 104  CO2 24 28 25    < > 26 25 23   GLUCOSE 139* 155* 101*   < > 113* 177* 140*  BUN 19  24* 10   < > 15 19 15   CREATININE 0.58 0.80 0.57   < > 0.81 0.80 0.65  CALCIUM 8.3* 8.5* 9.4   < > 9.0 8.7* 8.5*  GFRNONAA >60 >60 >60   < > >60 >60 >60  GFRAA >60 >60 >60  --   --   --   --   PROT 6.6 6.7 7.9  --  7.5 7.2 7.2  ALBUMIN 3.6 3.6 3.9  --  4.0 3.6 3.7  AST 21 33 38  --  32 34 22  ALT 29 71* 27  --  27 44 28  ALKPHOS 76 76 73  --  78 75 91  BILITOT 0.4 0.5 0.6  --  0.7 0.6 0.4   < > = values in this interval not displayed.   Iron/TIBC/Ferritin/ %Sat No results found for: IRON, TIBC, FERRITIN, IRONPCTSAT    RADIOGRAPHIC STUDIES: I have personally reviewed the radiological images as listed and agreed with the findings in the report. No results found.    ASSESSMENT & PLAN:  1. Chemotherapy induced nausea and vomiting   2. Normocytic anemia   3. Hypocalcemia   4. Chemotherapy induced neutropenia (HCC)   5. Malignant neoplasm of cervix, unspecified site [facility])   Cancer Staging Cervical cancer [facility]) Staging form: Cervix Uteri, AJCC Version 9 - Clinical stage from 09/11/2019: FIGO Stage IIIC2r, calculated as Stage IIIC2 (cT1b2, cN2, cM0) - Signed by Earlie Server, MD on 09/11/2019 - Pathologic stage from 05/20/2020: FIGO Stage IVB (pTX, pNX, pM1) - Signed by Earlie Server, MD on 05/20/2020  #Metastatic cervix cancer, currently on  first-line chemotherapy treatment with carboplatin, Taxol, bevacizumab. Labs are reviewed and discussed with patient Tolerates chemotherapy.  #Chemotherapy-induced nausea/vomiting, refractory to oral Zofran and Compazine. Recommend patient to continue olanzapine 10 mg at bedtime and Phenergan 25 mg suppository every 6 hours as needed.  Patient will receive IV normal saline x1 today for hydration. #Neutropenia, chemotherapy-induced.  Monitor. #Normocytic anemia, hemoglobin 11.3.  Stable. #Diarrhea, discussed about Imodium instructions. #Hypercalcemia, recommend patient to start calcium 1200 mg daily. #Body aches, recommend patient to utilize Tylenol and tramadol. #Follow-up in 1 week for evaluation of symptoms.  We spent sufficient time to discuss many aspect of care, questions were answered to patient's satisfaction.Spanish interpreter was present for the entire encounter   All questions were answered. The patient knows to call the clinic with any problems questions or concerns.  Return of visit:   Lab MD 1 week with carboplatin/Taxol/bevacizumab.  Earlie Server, MD, PhD Hematology Oncology [facility] Pager- [phone] 06/14/2020

## 2020-06-14 NOTE — Progress Notes (Signed)
Receiving IV hydration today. Accepted drink and crackers. No complaints at discharge.

## 2020-06-17 ENCOUNTER — Other Ambulatory Visit: Payer: Self-pay | Admitting: Oncology

## 2020-06-17 ENCOUNTER — Telehealth: Payer: Self-pay | Admitting: *Deleted

## 2020-06-17 MED ORDER — LOPERAMIDE HCL 2 MG PO CAPS: 2.0000 mg | ORAL_CAPSULE | ORAL | 0 refills | Status: DC

## 2020-06-17 NOTE — Telephone Encounter (Signed)
Imodium Rx were sent to pharmacy. Advise her to stop senna too.

## 2020-06-17 NOTE — Telephone Encounter (Signed)
[name] called stating that medicine for diarrhea was to have been called in for patient Friday, but nothing has been sent. She has not tried Imodium AD at this time, but she will purchase some to use until she hears back from Dr Tasia Catchings. Please advise

## 2020-06-17 NOTE — Telephone Encounter (Signed)
Call returned to [facility] and advised of doctor response. She states that patient is not taking any stool softeners

## 2020-06-20 ENCOUNTER — Encounter: Payer: Self-pay | Admitting: Oncology

## 2020-06-20 ENCOUNTER — Inpatient Hospital Stay (HOSPITAL_BASED_OUTPATIENT_CLINIC_OR_DEPARTMENT_OTHER): Payer: BC Managed Care – PPO | Admitting: Oncology

## 2020-06-20 ENCOUNTER — Inpatient Hospital Stay: Payer: BC Managed Care – PPO

## 2020-06-20 VITALS — BP 150/74 | HR 73 | Resp 16

## 2020-06-20 VITALS — BP 137/67 | HR 96 | Temp 98.3°F | Resp 18 | Wt 183.3 lb

## 2020-06-20 DIAGNOSIS — R112 Nausea with vomiting, unspecified: Secondary | ICD-10-CM

## 2020-06-20 DIAGNOSIS — C538 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 uteri: Secondary | ICD-10-CM

## 2020-06-20 DIAGNOSIS — C539 Malignant neoplasm of cervix uteri, unspecified: Secondary | ICD-10-CM

## 2020-06-20 DIAGNOSIS — Z5111 Encounter for antineoplastic chemotherapy: Secondary | ICD-10-CM

## 2020-06-20 DIAGNOSIS — D649 Anemia, unspecified: Secondary | ICD-10-CM | POA: Diagnosis not present

## 2020-06-20 DIAGNOSIS — T451X5A Adverse effect of antineoplastic and immunosuppressive drugs, initial encounter: Secondary | ICD-10-CM

## 2020-06-20 DIAGNOSIS — C530 Malignant neoplasm of endocervix: Secondary | ICD-10-CM

## 2020-06-20 DIAGNOSIS — Z5112 Encounter for antineoplastic immunotherapy: Secondary | ICD-10-CM | POA: Diagnosis not present

## 2020-06-20 DIAGNOSIS — R52 Pain, unspecified: Secondary | ICD-10-CM

## 2020-06-20 LAB — COMPREHENSIVE METABOLIC PANEL
ALT: 26 U/L (ref 0–44)
AST: 32 U/L (ref 15–41)
Albumin: 3.8 g/dL (ref 3.5–5.0)
Alkaline Phosphatase: 105 U/L (ref 38–126)
Anion gap: 10 (ref 5–15)
BUN: 14 mg/dL (ref 6–20)
CO2: 21 mmol/L — ABNORMAL LOW (ref 22–32)
Calcium: 8.6 mg/dL — ABNORMAL LOW (ref 8.9–10.3)
Chloride: 108 mmol/L (ref 98–111)
Creatinine, Ser: 0.65 mg/dL (ref 0.44–1.00)
GFR, Estimated: >60 mL/min (ref >60)
Glucose, Bld: 179 mg/dL — ABNORMAL HIGH (ref 70–99)
Potassium: 3.8 mmol/L (ref 3.5–5.1)
Sodium: 139 mmol/L (ref 135–145)
Total Bilirubin: 0.4 mg/dL (ref 0.3–1.2)
Total Protein: 7.3 g/dL (ref 6.5–8.1)

## 2020-06-20 LAB — CBC WITH DIFFERENTIAL/PLATELET
Abs Immature Granulocytes: 0.03 10*3/uL (ref 0.00–0.07)
Basophils Absolute: 0 10*3/uL (ref 0.0–0.1)
Basophils Relative: 1 %
Eosinophils Absolute: 0.1 10*3/uL (ref 0.0–0.5)
Eosinophils Relative: 2 %
HCT: 34.4 % — ABNORMAL LOW (ref 36.0–46.0)
Hemoglobin: 11.2 g/dL — ABNORMAL LOW (ref 12.0–15.0)
Immature Granulocytes: 1 %
Lymphocytes Relative: 24 %
Lymphs Abs: 1.2 10*3/uL (ref 0.7–4.0)
MCH: 30.9 pg (ref 26.0–34.0)
MCHC: 32.6 g/dL (ref 30.0–36.0)
MCV: 94.8 fL (ref 80.0–100.0)
Monocytes Absolute: 0.4 10*3/uL (ref 0.1–1.0)
Monocytes Relative: 8 %
Neutro Abs: 3.4 10*3/uL (ref 1.7–7.7)
Neutrophils Relative %: 64 %
Platelets: 234 10*3/uL (ref 150–400)
RBC: 3.63 MIL/uL — ABNORMAL LOW (ref 3.87–5.11)
RDW: 13.1 % (ref 11.5–15.5)
WBC: 5.2 10*3/uL (ref 4.0–10.5)
nRBC: 0 % (ref 0.0–0.2)

## 2020-06-20 MED ORDER — DIPHENHYDRAMINE HCL 50 MG/ML IJ SOLN
50.0000 mg | Freq: Once | INTRAMUSCULAR | Status: AC
  Administered 2020-06-20: 50 mg via INTRAVENOUS
  Filled 2020-06-20: qty 1

## 2020-06-20 MED ORDER — SODIUM CHLORIDE 0.9 % IV SOLN
150.0000 mg | Freq: Once | INTRAVENOUS | Status: AC
  Administered 2020-06-20: 150 mg via INTRAVENOUS
  Filled 2020-06-20: qty 150

## 2020-06-20 MED ORDER — SODIUM CHLORIDE 0.9 % IV SOLN
Freq: Once | INTRAVENOUS | Status: DC | PRN
  Filled 2020-06-20: qty 250

## 2020-06-20 MED ORDER — SODIUM CHLORIDE 0.9 % IV SOLN
Freq: Once | INTRAVENOUS | Status: AC
  Filled 2020-06-20: qty 250

## 2020-06-20 MED ORDER — PALONOSETRON HCL INJECTION 0.25 MG/5ML
0.2500 mg | Freq: Once | INTRAVENOUS | Status: AC
  Administered 2020-06-20: 0.25 mg via INTRAVENOUS
  Filled 2020-06-20: qty 5

## 2020-06-20 MED ORDER — DIPHENHYDRAMINE HCL 50 MG/ML IJ SOLN
50.0000 mg | Freq: Once | INTRAMUSCULAR | Status: AC | PRN
  Administered 2020-06-20: 25 mg via INTRAVENOUS

## 2020-06-20 MED ORDER — SODIUM CHLORIDE 0.9 % IV SOLN
135.0000 mg/m2 | Freq: Once | INTRAVENOUS | Status: AC
  Administered 2020-06-20: 252 mg via INTRAVENOUS
  Filled 2020-06-20: qty 42

## 2020-06-20 MED ORDER — FAMOTIDINE IN NACL 20-0.9 MG/50ML-% IV SOLN
20.0000 mg | Freq: Once | INTRAVENOUS | Status: AC | PRN
  Administered 2020-06-20: 20 mg via INTRAVENOUS

## 2020-06-20 MED ORDER — TRAMADOL HCL 50 MG PO TABS: 50.0000 mg | ORAL_TABLET | Freq: Two times a day (BID) | ORAL | 0 refills | Status: DC | PRN

## 2020-06-20 MED ORDER — SODIUM CHLORIDE 0.9% FLUSH
10.0000 mL | INTRAVENOUS | Status: DC | PRN
  Filled 2020-06-20: qty 10

## 2020-06-20 MED ORDER — HEPARIN SOD (PORK) LOCK FLUSH 100 UNIT/ML IV SOLN
500.0000 [IU] | Freq: Once | INTRAVENOUS | Status: DC | PRN
  Filled 2020-06-20: qty 5

## 2020-06-20 MED ORDER — PROMETHAZINE HCL 25 MG RE SUPP: 25.0000 mg | Freq: Four times a day (QID) | RECTAL | 3 refills | Status: DC | PRN

## 2020-06-20 MED ORDER — SODIUM CHLORIDE 0.9 % IV SOLN
15.0000 mg/kg | Freq: Once | INTRAVENOUS | Status: AC
  Administered 2020-06-20: 1200 mg via INTRAVENOUS
  Filled 2020-06-20: qty 48

## 2020-06-20 MED ORDER — SODIUM CHLORIDE 0.9 % IV SOLN
610.0000 mg | Freq: Once | INTRAVENOUS | Status: AC
Start: 2020-06-20 — End: 2020-06-20
  Administered 2020-06-20: 610 mg via INTRAVENOUS
  Filled 2020-06-20: qty 61

## 2020-06-20 MED ORDER — METHYLPREDNISOLONE SODIUM SUCC 125 MG IJ SOLR
125.0000 mg | Freq: Once | INTRAMUSCULAR | Status: AC | PRN
  Administered 2020-06-20: 125 mg via INTRAVENOUS

## 2020-06-20 MED ORDER — MONTELUKAST SODIUM 10 MG PO TABS
10.0000 mg | ORAL_TABLET | Freq: Once | ORAL | Status: AC
  Administered 2020-06-20: 10 mg via ORAL
  Filled 2020-06-20: qty 1

## 2020-06-20 MED ORDER — SODIUM CHLORIDE 0.9 % IV SOLN
10.0000 mg | Freq: Once | INTRAVENOUS | Status: AC
  Administered 2020-06-20: 10 mg via INTRAVENOUS
  Filled 2020-06-20: qty 10

## 2020-06-20 MED ORDER — FAMOTIDINE IN NACL 20-0.9 MG/50ML-% IV SOLN
20.0000 mg | Freq: Once | INTRAVENOUS | Status: AC
  Administered 2020-06-20: 20 mg via INTRAVENOUS
  Filled 2020-06-20: qty 50

## 2020-06-20 NOTE — Progress Notes (Signed)
Shortly after beginning paclitaxel infusion, patient returned from the bathroom and reported to nurse that her left shoulder and chest were painful and she was feeling short of breath. Infusion stopped, provider notified. BP 185/97, HR 82, O2 sat 98% on room air. NS to gravity, Benadryl, solu-medrol given per protocol. Beckey Rutter, NP to chairside at 1208. Patient reporting some improvement of symptoms. Pepcid, administered per orders - see MAR. 1212 VS stable, symptoms continuing to improve. Singular administered per orders. VS 148/74, HR 74, O2 sat 99%. Restarted at 50% per orders once paclitaxel once patient at baseline. Tolerated remaining infusion well. Discharged home in stable condition.

## 2020-06-20 NOTE — Progress Notes (Signed)
Hematology/Oncology follow up note [facility] [phone] [phone]   Patient Care Team: Crissie Figures, PA-C as PCP - General (Physician Assistant) Clent Jacks, RN as Oncology Nurse Navigator  REFERRING PROVIDER: Crissie Figures, PA-C  CHIEF COMPLAINTS/REASON FOR VISIT:  Follow up for cervical cancer, treatment related symptoms.  HISTORY OF PRESENTING ILLNESS:   Jill Rodriguez is a  60 y.o.  female with PMH listed below was seen in consultation at the request of  Crissie Figures, PA-C  for evaluation of cervical cancer.   Patient is G3, P3 Spanish-speaking postmenopausal female who initially presented with vaginal bleeding.  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  No intervention seen.  08/23/2019, pelvis ultrasound showed endometrium measures 8 mm.  Echogenic endometrial microcalcification seen throughout endometrium.  Hypoechoic area in the fundal aspect of the endometrium measures 0.8 x 1.1 x 2.3 cm Survey of the adnexa showed no adnexal masses.  Patient has history of prominent right neck pelvis and CT angio neck has been ordered. Patient was seen by Dr. Fransisca Connors, clinically the tumor is 3 cm and replaced whole cervix. 09/06/2019 PET scan showed hypermetabolic mass involving the cervix and the lower uterine segment consistent with known cervical cancer. Multiple small hypermetabolic retroperitoneal and pelvic lymph node bilaterally consistent with nodal metastasis. She also has small hypermetabolic lymph nodes in the left superior mediastinum and left supraclavicular regions are also suspicious for nodal metastasis.  Hepatic steatosis no cholelithiasis noted.  At least Stage IIIc Cervix cancer, with small hypermetabolic lymph  nodes in the left superior mediastinum and left supraclavicular region which could put her to stage IV disease. hypermetabolic activity initially improved after concurrent chemoradiation.  # Cancer Treatment:  July 2021 Finished concurrent chemoradiation with weekly Cisplatin.   # 02/06/2020 repeat PET scan showed recurrent hypermetabolic left mediastinum lymph node.  No additional sites of recurrent hypermetabolic metastasis.  Residual mild hypermetabolism within the uterine cervix, slightly decreased.  2 small lung nodules with activity below PET resolution. Nonspecific persistent mild hypermetabolic 7 in the lower thoracic esophageal without discrete mass.  Diffuse hepatic steatosis, cholelithiasis atherosclerosis # She underwent EUS for further evaluation of esophageal hypermetabolic activity as well attempt to get biopsy, not feasible  #PET scan done at [facility], 04/03/2020, PET scan showed subcentimeter hypermetabolic cervical, mediastinal, retrocrural lymph nodes, concerning for lymphatic metastasis.  Multiple tiny indeterminate pulmonary nodule below the resolution of PET.  No abnormal FDG uptake within the cervix at the site of known primary malignancy. Patient has also developed a more supraclavicular fullness.  Patient was seen by Dr. Fransisca Connors on 05/01/2020.  Decision was made to proceed with biopsy of the left supraclavicular lymph node.   INTERVAL HISTORY Jill Rodriguez is a 60 y.o. female who has above history reviewed by me today presents for stage IV cervical cancer.  Problems and complaints are listed below: Patient is accompanied by her daughter. Patient has multiple complaints today.    #Hair loss and scalp sensitivity. #Nausea vomiting, symptoms are relieved with.  phenergan suppository .  Review of Systems  Constitutional: Positive for fatigue. Negative for appetite change, chills, fever and unexpected weight change.  HENT:   Negative for hearing loss and voice change.    Eyes: Negative for eye problems.  Respiratory: Negative for chest tightness and cough.  Cardiovascular: Negative for chest pain.  Gastrointestinal: Negative for abdominal distention, abdominal pain, blood in stool, nausea and vomiting.  Endocrine: Negative for hot flashes.  Genitourinary: Negative for difficulty urinating and frequency.   Musculoskeletal: Negative for arthralgias.  Skin: Negative for itching and rash.  Neurological: Positive for numbness. Negative for extremity weakness.  Hematological: Negative for adenopathy.  Psychiatric/Behavioral: Negative for confusion.    MEDICAL HISTORY:  Past Medical History:  Diagnosis Date  . Cancer ([place])    cervical CA, that spread to lymph nodes; last chemo was 12/08/2019; radiation 01/2020  . COVID-19 10/2018  . Nausea with vomiting 10/31/2019  . No pertinent past medical history     SURGICAL HISTORY: Past Surgical History:  Procedure Laterality Date  . ESOPHAGOGASTRODUODENOSCOPY (EGD) WITH PROPOFOL N/A 03/28/2020   Procedure: ESOPHAGOGASTRODUODENOSCOPY (EGD) WITH PROPOFOL;  Surgeon: Milus Banister, MD;  Location: [facility];  Service: Endoscopy;  Laterality: N/A;  . EUS N/A 03/28/2020   Procedure: UPPER ENDOSCOPIC ULTRASOUND (EUS) RADIAL;  Surgeon: Milus Banister, MD;  Location: [facility];  Service: Endoscopy;  Laterality: N/A;  . no surgical history      SOCIAL HISTORY: Social History   Socioeconomic History  . Marital status: Married    Spouse name: Rosezetta Schlatter   . Number of children: 3  . Years of education: Not on file  . Highest education level: Not on file  Occupational History  . Not on file  Tobacco Use  . Smoking status: Never Smoker  . Smokeless tobacco: Never Used  Vaping Use  . Vaping Use: Never used  Substance and Sexual Activity  . Alcohol use: Never  . Drug use: Never  . Sexual activity: Yes    Birth control/protection: None  Other Topics Concern  . Not on file  Social History Narrative    Lives at home with spouse ; daughter [name] comes & stays when needed   Social Determinants of Health   Financial Resource Strain: Not on file  Food Insecurity: Not on file  Transportation Needs: Not on file  Physical Activity: Not on file  Stress: Not on file  Social Connections: Not on file  Intimate Partner Violence: Not on file    FAMILY HISTORY: Family History  Problem Relation Age of Onset  . Blindness Mother   . Glaucoma Mother   . Diabetes Father   . Diabetes Brother     ALLERGIES:  has No Known Allergies.  MEDICATIONS:  Current Outpatient Medications  Medication Sig Dispense Refill  . dexamethasone (DECADRON) 4 MG tablet Take 2 tablets (8 mg total) by mouth See admin instructions. Take 8mg  daily for 2 days after each chemotherapy-cisplatin treatments. 24 tablet 0  . ondansetron (ZOFRAN) 8 MG tablet Take 1 tablet (8 mg total) by mouth 2 (two) times daily as needed for refractory nausea / vomiting. Start on day 3 after carboplatin chemo. 30 tablet 1  . prochlorperazine (COMPAZINE) 10 MG tablet Take 1 tablet (10 mg total) by mouth every 6 (six) hours as needed (Nausea or vomiting). 30 tablet 1  . traMADol (ULTRAM) 50 MG tablet Take 1 tablet (50 mg total) by mouth every 6 (six) hours as needed. 90 tablet 0  . dexamethasone (DECADRON) 4 MG tablet Take 2 tablets (8 mg total) by mouth daily. Start the day after carboplatin chemotherapy for 3 days. (Patient not taking: No sig reported) 30 tablet 1  . loperamide (IMODIUM) 2 MG capsule Take 1 capsule (2 mg total) by mouth See admin instructions.  Take 2 capsules with onset of diarrhea. Then take 1 capsule after every loose bowel movements. Maximum 8 capsules per 24 hours. (Patient not taking: Reported on 06/20/2020) 60 capsule 0  . nystatin (MYCOSTATIN) 100000 UNIT/ML suspension Use as directed 5 mLs (500,000 Units total) in the mouth or throat 4 (four) times daily. (Patient not taking: No sig reported) 120 mL 0  . OLANZapine (ZYPREXA)  10 MG tablet Take 1 tablet (10 mg total) by mouth at bedtime. (Patient not taking: No sig reported) 30 tablet 0  . omeprazole (PRILOSEC) 20 MG capsule Take 1 capsule (20 mg total) by mouth daily. (Patient not taking: Reported on 06/20/2020) 30 capsule 1  . ondansetron (ZOFRAN ODT) 4 MG disintegrating tablet Take 1 tablet (4 mg total) by mouth every 8 (eight) hours as needed. (Patient not taking: Reported on 06/20/2020) 20 tablet 0  . promethazine (PHENERGAN) 25 MG suppository Place 1 suppository (25 mg total) rectally every 6 (six) hours as needed for nausea, vomiting or refractory nausea / vomiting. 120 each 3  . senna (SENOKOT) 8.6 MG TABS tablet Take 2 tablets (17.2 mg total) by mouth daily. (Patient not taking: No sig reported) 120 tablet 0   No current facility-administered medications for this visit.   Facility-Administered Medications Ordered in Other Visits  Medication Dose Route Frequency Provider Last Rate Last Admin  . 0.9 %  sodium chloride infusion   Intravenous Once PRN Earlie Server, MD 999 mL/hr at 06/20/20 1204 New Bag at 06/20/20 1204  . CARBOplatin (PARAPLATIN) 610 mg in sodium chloride 0.9 % 250 mL chemo infusion  610 mg Intravenous Once Earlie Server, MD      . heparin lock flush 100 unit/mL  500 Units Intracatheter Once PRN Earlie Server, MD      . PACLitaxel (TAXOL) 252 mg in sodium chloride 0.9 % 250 mL chemo infusion (> 80mg /m2)  135 mg/m2 (Treatment Plan Recorded) Intravenous Once Earlie Server, MD 49 mL/hr at 06/20/20 1300 Restarted at 06/20/20 1300  . sodium chloride flush (NS) 0.9 % injection 10 mL  10 mL Intracatheter PRN Earlie Server, MD         PHYSICAL EXAMINATION: ECOG PERFORMANCE STATUS: 1 - Symptomatic but completely ambulatory Vitals:   06/20/20 0854  BP: 137/67  Pulse: 96  Resp: 18  Temp: 98.3 F (36.8 C)   Filed Weights   06/20/20 0854  Weight: 183 lb 4.8 oz (83.1 kg)    Physical Exam Constitutional:      General: She is not in acute distress. HENT:     Head:  Normocephalic and atraumatic.  Eyes:     General: No scleral icterus. Cardiovascular:     Rate and Rhythm: Normal rate and regular rhythm.     Heart sounds: Normal heart sounds.  Pulmonary:     Effort: Pulmonary effort is normal. No respiratory distress.     Breath sounds: No wheezing.  Abdominal:     General: Bowel sounds are normal. There is no distension.     Palpations: Abdomen is soft.  Musculoskeletal:        General: No deformity. Normal range of motion.     Cervical back: Normal range of motion and neck supple.  Lymphadenopathy:     Cervical: Cervical adenopathy present.  Skin:    General: Skin is warm and dry.     Findings: No erythema or rash.  Neurological:     Mental Status: She is alert and oriented to person, place, and time. Mental status  is at baseline.     Cranial Nerves: No cranial nerve deficit.     Coordination: Coordination normal.  Psychiatric:        Mood and Affect: Mood normal.     LABORATORY DATA:  I have reviewed the data as listed Lab Results  Component Value Date   WBC 5.2 06/20/2020   HGB 11.2 (L) 06/20/2020   HCT 34.4 (L) 06/20/2020   MCV 94.8 06/20/2020   PLT 234 06/20/2020   Recent Labs    11/02/19 0803 11/07/19 1054 01/29/20 1738 05/23/20 1504 06/06/20 0850 06/14/20 1254 06/20/20 0834  NA 139 138 138   < > 136 138 139  K 3.4* 3.9 3.6   < > 3.7 3.8 3.8  CL 106 103 103   < > 99 104 108  CO2 24 28 25    < > 25 23 21*  GLUCOSE 139* 155* 101*   < > 177* 140* 179*  BUN 19 24* 10   < > 19 15 14   CREATININE 0.58 0.80 0.57   < > 0.80 0.65 0.65  CALCIUM 8.3* 8.5* 9.4   < > 8.7* 8.5* 8.6*  GFRNONAA >60 >60 >60   < > >60 >60 >60  GFRAA >60 >60 >60  --   --   --   --   PROT 6.6 6.7 7.9   < > 7.2 7.2 7.3  ALBUMIN 3.6 3.6 3.9   < > 3.6 3.7 3.8  AST 21 33 38   < > 34 22 32  ALT 29 71* 27   < > 44 28 26  ALKPHOS 76 76 73   < > 75 91 105  BILITOT 0.4 0.5 0.6   < > 0.6 0.4 0.4   < > = values in this interval not displayed.    Iron/TIBC/Ferritin/ %Sat No results found for: IRON, TIBC, FERRITIN, IRONPCTSAT    RADIOGRAPHIC STUDIES: I have personally reviewed the radiological images as listed and agreed with the findings in the report. No results found.    ASSESSMENT & PLAN:  1. Chemotherapy induced nausea and vomiting   2. 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 ([place])   3. Normocytic anemia   4. Hypocalcemia   5. Body aches   6. Encounter for antineoplastic chemotherapy   Cancer Staging Malignant neoplasm of cervix [facility]) Staging form: Cervix Uteri, AJCC Version 9 - Clinical stage from 09/11/2019: FIGO Stage IIIC2r, calculated as Stage IIIC2 (cT1b2, cN2, cM0) - Signed by Earlie Server, MD on 09/11/2019 - Pathologic stage from 05/20/2020: FIGO Stage IVB (pTX, pNX, pM1) - Signed by Earlie Server, MD on 05/20/2020  #Metastatic cervix cancer, currently on first-line chemotherapy treatment with carboplatin, Taxol, bevacizumab. Labs are reviewed and discussed with patient. Proceed with today's chemotherapy treatment with carboplatin/Taxol/bevacizumab.  #Chemotherapy-induced nausea/vomiting, refractory to oral Zofran and Compazine. continue olanzapine 10 mg at bedtime and Phenergan 25 mg suppository every 6 hours as needed.    #Normocytic anemia, hemoglobin 11.2.  Stable. #Diarrhea, discussed about Imodium instructions.  Improved. #Hypocalcemia, patient has just started calcium 1200 mg daily. #Body aches, recommend patient to utilize Tylenol and tramadol. Patient was to have tramadol refilled.  She reports that she does not use it often but want to have it if she needs it.  We spent sufficient time to discuss many aspect of care, questions were answered to patient's satisfaction.Spanish interpreter was present for the entire encounter   All questions were answered. The patient knows to call the clinic with  any problems questions or concerns.  Return of visit:   Lab MD 3 week with  carboplatin/Taxol/bevacizumab.  Earlie Server, MD, PhD Hematology Oncology [facility] at [facility] Pager- [phone] 06/20/2020

## 2020-06-20 NOTE — Progress Notes (Signed)
Patient here for follow up. Pt requesting refill on tramadol and phenergan suppository. Pt reports that she has had hair loss and tenderness to scalp and wants to know if she should be using a special shampoo.

## 2020-06-21 ENCOUNTER — Telehealth: Payer: Self-pay

## 2020-06-21 NOTE — Telephone Encounter (Signed)
Prior authorization Tramadol submitted and approved.  Approval good from 06/21/20 to 06/21/21.

## 2020-07-04 ENCOUNTER — Telehealth: Payer: Self-pay | Admitting: *Deleted

## 2020-07-04 NOTE — Telephone Encounter (Signed)
Patient's daughter Jill Rodriguez called stating that they will be traveling and their flight is scheduled to depart on 3/17. She is wanting to know if pt can get treatment on 3/15 or 3/16, if not she would like for appt on 3/17 to be cancelled. I told her that most likely treatment cannot be done on those dates, but I will verify with Dr. Tasia Catchings and let her know.  They will be returning around April 5th. She will check to see if she can find another flight and requested a call back on Monday to follow up decision about treatment appt.

## 2020-07-04 NOTE — Telephone Encounter (Signed)
Daughter [name] called requesting a return call from Dr Collie Siad nurse to discuss patient's upcoming appt

## 2020-07-05 ENCOUNTER — Other Ambulatory Visit: Payer: Self-pay

## 2020-07-07 ENCOUNTER — Other Ambulatory Visit: Payer: Self-pay | Admitting: Oncology

## 2020-07-07 MED ORDER — SULFAMETHOXAZOLE-TRIMETHOPRIM 800-160 MG PO TABS: 1.0000 | ORAL_TABLET | Freq: Two times a day (BID) | ORAL | 0 refills | Status: DC

## 2020-07-08 MED ORDER — OLANZAPINE 10 MG PO TABS: 10.0000 mg | ORAL_TABLET | Freq: Every day | ORAL | 0 refills | Status: DC

## 2020-07-08 NOTE — Telephone Encounter (Signed)
Spoke to patient's daughter, [name], and notified her that Dr. Tasia Catchings does not recommend delaying treatment, but it is up to patient whether she want to delay it. Daisy voiced understanding and stated that they will be leaving on 3/17 and would like next appt to be on 4/6.   Please cancel r/s appts on 3/17 to 4/6. I will leave a copy of AVS downstairs for Daisy to pick up.

## 2020-07-08 NOTE — Telephone Encounter (Signed)
Done

## 2020-07-11 ENCOUNTER — Inpatient Hospital Stay: Payer: BC Managed Care – PPO

## 2020-07-11 ENCOUNTER — Inpatient Hospital Stay: Payer: BC Managed Care – PPO | Admitting: Oncology

## 2020-07-29 ENCOUNTER — Telehealth: Payer: Self-pay | Admitting: Oncology

## 2020-07-29 NOTE — Telephone Encounter (Signed)
Please advise 

## 2020-07-29 NOTE — Telephone Encounter (Signed)
Plan to repeat scan after 3-4 cycles of treatment she has only had 2 cycles I believe.

## 2020-07-29 NOTE — Telephone Encounter (Signed)
Spoke to [place] about MD recommendation of when to re-scan.  She states that her mom really wants to have PET before deciding to proceed with further tx and did  not care if it would be denied by insurance, she will pay out of pocket for the PET.  [name] is going to talk with her mom about MD advice to wait 3-4 cycles before next scan and if she decides to cancel her lab/MD/tx appts this Wed (4/6) she will call tomorrow  I advised them to keep that appt so they can discuss with MD further.

## 2020-07-30 NOTE — Telephone Encounter (Signed)
I spoke to patient's daughter yesterday.

## 2020-07-31 ENCOUNTER — Inpatient Hospital Stay: Payer: BC Managed Care – PPO

## 2020-07-31 ENCOUNTER — Inpatient Hospital Stay: Payer: BC Managed Care – PPO | Attending: Obstetrics and Gynecology

## 2020-07-31 ENCOUNTER — Encounter: Payer: Self-pay | Admitting: Oncology

## 2020-07-31 ENCOUNTER — Inpatient Hospital Stay (HOSPITAL_BASED_OUTPATIENT_CLINIC_OR_DEPARTMENT_OTHER): Payer: BC Managed Care – PPO | Admitting: Oncology

## 2020-07-31 VITALS — BP 152/76 | HR 85 | Temp 98.8°F | Resp 16 | Wt 188.3 lb

## 2020-07-31 DIAGNOSIS — D649 Anemia, unspecified: Secondary | ICD-10-CM | POA: Insufficient documentation

## 2020-07-31 DIAGNOSIS — R300 Dysuria: Secondary | ICD-10-CM | POA: Insufficient documentation

## 2020-07-31 DIAGNOSIS — Z7189 Other specified counseling: Secondary | ICD-10-CM | POA: Diagnosis not present

## 2020-07-31 DIAGNOSIS — C538 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 uteri: Secondary | ICD-10-CM | POA: Insufficient documentation

## 2020-07-31 DIAGNOSIS — C530 Malignant neoplasm of endocervix: Secondary | ICD-10-CM

## 2020-07-31 LAB — COMPREHENSIVE METABOLIC PANEL
ALT: 21 U/L (ref 0–44)
AST: 32 U/L (ref 15–41)
Albumin: 3.5 g/dL (ref 3.5–5.0)
Alkaline Phosphatase: 95 U/L (ref 38–126)
Anion gap: 9 (ref 5–15)
BUN: 17 mg/dL (ref 6–20)
CO2: 21 mmol/L — ABNORMAL LOW (ref 22–32)
Calcium: 9 mg/dL (ref 8.9–10.3)
Chloride: 108 mmol/L (ref 98–111)
Creatinine, Ser: 0.72 mg/dL (ref 0.44–1.00)
GFR, Estimated: >60 mL/min (ref >60)
Glucose, Bld: 183 mg/dL — ABNORMAL HIGH (ref 70–99)
Potassium: 3.5 mmol/L (ref 3.5–5.1)
Sodium: 138 mmol/L (ref 135–145)
Total Bilirubin: 0.5 mg/dL (ref 0.3–1.2)
Total Protein: 6.8 g/dL (ref 6.5–8.1)

## 2020-07-31 LAB — CBC WITH DIFFERENTIAL/PLATELET
Abs Immature Granulocytes: 0.02 10*3/uL (ref 0.00–0.07)
Basophils Absolute: 0 10*3/uL (ref 0.0–0.1)
Basophils Relative: 1 %
Eosinophils Absolute: 0.1 10*3/uL (ref 0.0–0.5)
Eosinophils Relative: 3 %
HCT: 34.7 % — ABNORMAL LOW (ref 36.0–46.0)
Hemoglobin: 11.4 g/dL — ABNORMAL LOW (ref 12.0–15.0)
Immature Granulocytes: 0 %
Lymphocytes Relative: 20 %
Lymphs Abs: 0.9 10*3/uL (ref 0.7–4.0)
MCH: 31.9 pg (ref 26.0–34.0)
MCHC: 32.9 g/dL (ref 30.0–36.0)
MCV: 97.2 fL (ref 80.0–100.0)
Monocytes Absolute: 0.4 10*3/uL (ref 0.1–1.0)
Monocytes Relative: 9 %
Neutro Abs: 3.1 10*3/uL (ref 1.7–7.7)
Neutrophils Relative %: 67 %
Platelets: 214 10*3/uL (ref 150–400)
RBC: 3.57 MIL/uL — ABNORMAL LOW (ref 3.87–5.11)
RDW: 15.4 % (ref 11.5–15.5)
WBC: 4.6 10*3/uL (ref 4.0–10.5)
nRBC: 0 % (ref 0.0–0.2)

## 2020-07-31 LAB — PROTEIN, URINE, RANDOM: Total Protein, Urine: <6 mg/dL

## 2020-07-31 NOTE — Progress Notes (Signed)
Patient having dysuria that was not improved with Bactrim DS that was prescribed on 07/07/20.

## 2020-07-31 NOTE — Progress Notes (Signed)
Hematology/Oncology follow up note [facility] [phone] [phone]   Patient Care Team: Crissie Figures, PA-C as PCP - General (Physician Assistant) Clent Jacks, RN as Oncology Nurse Navigator  REFERRING PROVIDER: Crissie Figures, PA-C  CHIEF COMPLAINTS/REASON FOR VISIT:  Follow up for cervical cancer, treatment related symptoms.  HISTORY OF PRESENTING ILLNESS:   Jill Rodriguez is a  60 y.o.  female with PMH listed below was seen in consultation at the request of  Crissie Figures, PA-C  for evaluation of cervical cancer.   Patient is G3, P3 Spanish-speaking postmenopausal female who initially presented with vaginal bleeding.  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  No intervention seen.  08/23/2019, pelvis ultrasound showed endometrium measures 8 mm.  Echogenic endometrial microcalcification seen throughout endometrium.  Hypoechoic area in the fundal aspect of the endometrium measures 0.8 x 1.1 x 2.3 cm Survey of the adnexa showed no adnexal masses.  Patient has history of prominent right neck pelvis and CT angio neck has been ordered. Patient was seen by Dr. Fransisca Connors, clinically the tumor is 3 cm and replaced whole cervix. 09/06/2019 PET scan showed hypermetabolic mass involving the cervix and the lower uterine segment consistent with known cervical cancer. Multiple small hypermetabolic retroperitoneal and pelvic lymph node bilaterally consistent with nodal metastasis. She also has small hypermetabolic lymph nodes in the left superior mediastinum and left supraclavicular regions are also suspicious for nodal metastasis.  Hepatic steatosis no cholelithiasis noted.  At least Stage IIIc Cervix cancer, with small hypermetabolic lymph  nodes in the left superior mediastinum and left supraclavicular region which could put her to stage IV disease. hypermetabolic activity initially improved after concurrent chemoradiation.  # Cancer Treatment:  July 2021 Finished concurrent chemoradiation with weekly Cisplatin.   # 02/06/2020 repeat PET scan showed recurrent hypermetabolic left mediastinum lymph node.  No additional sites of recurrent hypermetabolic metastasis.  Residual mild hypermetabolism within the uterine cervix, slightly decreased.  2 small lung nodules with activity below PET resolution. Nonspecific persistent mild hypermetabolic 7 in the lower thoracic esophageal without discrete mass.  Diffuse hepatic steatosis, cholelithiasis atherosclerosis # She underwent EUS for further evaluation of esophageal hypermetabolic activity as well attempt to get biopsy, not feasible  #PET scan done at [facility], 04/03/2020, PET scan showed subcentimeter hypermetabolic cervical, mediastinal, retrocrural lymph nodes, concerning for lymphatic metastasis.  Multiple tiny indeterminate pulmonary nodule below the resolution of PET.  No abnormal FDG uptake within the cervix at the site of known primary malignancy. Patient has also developed a more supraclavicular fullness.  Patient was seen by Dr. Fransisca Connors on 05/01/2020.  Decision was made to proceed with biopsy of the left supraclavicular lymph node.   INTERVAL HISTORY Jill Rodriguez is a 60 y.o. female who has above history reviewed by me today presents for stage IV cervical cancer. Patient tells me that she does not like the feeling when she is on chemotherapy.  Do not specify details she does not want to proceed with any additional chemotherapy at this point. She had dysuria that occurred cancer center.  On-call physician prescribed patient a course of Bactrim.  Patient does not feel any improvement of her dysuria symptoms.  No fever chills, flank pain nausea vomiting. Patient returned from her trip  to [place]  Review of Systems  Constitutional: Positive for  fatigue. Negative for appetite change, chills, fever and unexpected weight change.  HENT:   Negative for hearing loss and voice change.   Eyes: Negative for eye problems.  Respiratory: Negative for chest tightness and cough.   Cardiovascular: Negative for chest pain.  Gastrointestinal: Negative for abdominal distention, abdominal pain, blood in stool, nausea and vomiting.  Endocrine: Negative for hot flashes.  Genitourinary: Positive for dysuria. Negative for difficulty urinating and frequency.   Musculoskeletal: Negative for arthralgias.  Skin: Negative for itching and rash.  Neurological: Positive for numbness. Negative for extremity weakness.  Hematological: Negative for adenopathy.  Psychiatric/Behavioral: Negative for confusion.    MEDICAL HISTORY:  Past Medical History:  Diagnosis Date  . Cancer ([place])    cervical CA, that spread to lymph nodes; last chemo was 12/08/2019; radiation 01/2020  . COVID-19 10/2018  . Nausea with vomiting 10/31/2019  . No pertinent past medical history     SURGICAL HISTORY: Past Surgical History:  Procedure Laterality Date  . ESOPHAGOGASTRODUODENOSCOPY (EGD) WITH PROPOFOL N/A 03/28/2020   Procedure: ESOPHAGOGASTRODUODENOSCOPY (EGD) WITH PROPOFOL;  Surgeon: Milus Banister, MD;  Location: [facility];  Service: Endoscopy;  Laterality: N/A;  . EUS N/A 03/28/2020   Procedure: UPPER ENDOSCOPIC ULTRASOUND (EUS) RADIAL;  Surgeon: Milus Banister, MD;  Location: [facility];  Service: Endoscopy;  Laterality: N/A;  . no surgical history      SOCIAL HISTORY: Social History   Socioeconomic History  . Marital status: Married    Spouse name: Rosezetta Schlatter   . Number of children: 3  . Years of education: Not on file  . Highest education level: Not on file  Occupational History  . Not on file  Tobacco Use  . Smoking status: Never Smoker  . Smokeless tobacco: Never Used  Vaping Use  . Vaping  Use: Never used  Substance and Sexual Activity  . Alcohol use: Never  . Drug use: Never  . Sexual activity: Yes    Birth control/protection: None  Other Topics Concern  . Not on file  Social History Narrative   Lives at home with spouse ; daughter [name] comes & stays when needed   Social Determinants of Health   Financial Resource Strain: Not on file  Food Insecurity: Not on file  Transportation Needs: Not on file  Physical Activity: Not on file  Stress: Not on file  Social Connections: Not on file  Intimate Partner Violence: Not on file    FAMILY HISTORY: Family History  Problem Relation Age of Onset  . Blindness Mother   . Glaucoma Mother   . Diabetes Father   . Diabetes Brother     ALLERGIES:  has No Known Allergies.  MEDICATIONS:  Current Outpatient Medications  Medication Sig Dispense Refill  . dexamethasone (DECADRON) 4 MG tablet Take 2 tablets (8 mg total) by mouth See admin instructions. Take 8mg  daily for 2 days after each chemotherapy-cisplatin treatments. 24 tablet 0  . dexamethasone (DECADRON) 4 MG tablet Take 2 tablets (8 mg total) by mouth daily. Start the day after carboplatin chemotherapy for 3 days. (Patient not taking: No sig reported) 30 tablet 1  . loperamide (IMODIUM) 2 MG capsule Take 1 capsule (2 mg total) by mouth See admin instructions. Take 2 capsules with onset of diarrhea. Then take 1 capsule after every loose bowel movements. Maximum 8 capsules per 24 hours. (Patient not taking: Reported on 06/20/2020) 60 capsule 0  . nystatin (MYCOSTATIN) 100000 UNIT/ML suspension Use as directed 5 mLs (500,000 Units  total) in the mouth or throat 4 (four) times daily. (Patient not taking: No sig reported) 120 mL 0  . OLANZapine (ZYPREXA) 10 MG tablet Take 1 tablet (10 mg total) by mouth at bedtime. 30 tablet 0  . omeprazole (PRILOSEC) 20 MG capsule Take 1 capsule (20 mg total) by mouth daily. (Patient not taking: Reported on 06/20/2020) 30 capsule 1  . ondansetron  (ZOFRAN ODT) 4 MG disintegrating tablet Take 1 tablet (4 mg total) by mouth every 8 (eight) hours as needed. (Patient not taking: Reported on 06/20/2020) 20 tablet 0  . ondansetron (ZOFRAN) 8 MG tablet Take 1 tablet (8 mg total) by mouth 2 (two) times daily as needed for refractory nausea / vomiting. Start on day 3 after carboplatin chemo. 30 tablet 1  . prochlorperazine (COMPAZINE) 10 MG tablet Take 1 tablet (10 mg total) by mouth every 6 (six) hours as needed (Nausea or vomiting). 30 tablet 1  . promethazine (PHENERGAN) 25 MG suppository Place 1 suppository (25 mg total) rectally every 6 (six) hours as needed for nausea, vomiting or refractory nausea / vomiting. 120 each 3  . senna (SENOKOT) 8.6 MG TABS tablet Take 2 tablets (17.2 mg total) by mouth daily. (Patient not taking: No sig reported) 120 tablet 0  . sulfamethoxazole-trimethoprim (BACTRIM DS) 800-160 MG tablet Take 1 tablet by mouth 2 (two) times daily. 10 tablet 0  . traMADol (ULTRAM) 50 MG tablet Take 1 tablet (50 mg total) by mouth every 12 (twelve) hours as needed. 60 tablet 0   No current facility-administered medications for this visit.     PHYSICAL EXAMINATION: ECOG PERFORMANCE STATUS: 1 - Symptomatic but completely ambulatory Vitals:   07/31/20 0844  BP: (!) 152/76  Pulse: 85  Resp: 16  Temp: 98.8 F (37.1 C)   Filed Weights   07/31/20 0844  Weight: 188 lb 4.8 oz (85.4 kg)    Physical Exam Constitutional:      General: She is not in acute distress. HENT:     Head: Normocephalic and atraumatic.  Eyes:     General: No scleral icterus. Cardiovascular:     Rate and Rhythm: Normal rate and regular rhythm.     Heart sounds: Normal heart sounds.  Pulmonary:     Effort: Pulmonary effort is normal. No respiratory distress.     Breath sounds: No wheezing.  Abdominal:     General: Bowel sounds are normal. There is no distension.     Palpations: Abdomen is soft.  Musculoskeletal:        General: No deformity. Normal  range of motion.     Cervical back: Normal range of motion and neck supple.  Lymphadenopathy:     Cervical: Cervical adenopathy present.  Skin:    General: Skin is warm and dry.     Findings: No erythema or rash.  Neurological:     Mental Status: She is alert and oriented to person, place, and time. Mental status is at baseline.     Cranial Nerves: No cranial nerve deficit.     Coordination: Coordination normal.  Psychiatric:        Mood and Affect: Mood normal.     LABORATORY DATA:  I have reviewed the data as listed Lab Results  Component Value Date   WBC 4.6 07/31/2020   HGB 11.4 (L) 07/31/2020   HCT 34.7 (L) 07/31/2020   MCV 97.2 07/31/2020   PLT 214 07/31/2020   Recent Labs    11/02/19 0803 11/07/19 1054 01/29/20 1738  05/23/20 1504 06/14/20 1254 06/20/20 0834 07/31/20 0816  NA 139 138 138   < > 138 139 138  K 3.4* 3.9 3.6   < > 3.8 3.8 3.5  CL 106 103 103   < > 104 108 108  CO2 24 28 25    < > 23 21* 21*  GLUCOSE 139* 155* 101*   < > 140* 179* 183*  BUN 19 24* 10   < > 15 14 17   CREATININE 0.58 0.80 0.57   < > 0.65 0.65 0.72  CALCIUM 8.3* 8.5* 9.4   < > 8.5* 8.6* 9.0  GFRNONAA >60 >60 >60   < > >60 >60 >60  GFRAA >60 >60 >60  --   --   --   --   PROT 6.6 6.7 7.9   < > 7.2 7.3 6.8  ALBUMIN 3.6 3.6 3.9   < > 3.7 3.8 3.5  AST 21 33 38   < > 22 32 32  ALT 29 71* 27   < > 28 26 21   ALKPHOS 76 76 73   < > 91 105 95  BILITOT 0.4 0.5 0.6   < > 0.4 0.4 0.5   < > = values in this interval not displayed.   Iron/TIBC/Ferritin/ %Sat No results found for: IRON, TIBC, FERRITIN, IRONPCTSAT    RADIOGRAPHIC STUDIES: I have personally reviewed the radiological images as listed and agreed with the findings in the report. No results found.    ASSESSMENT & PLAN:  1. 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 ([place])   2. Normocytic anemia   3. Goals of care, counseling/discussion   4. Dysuria   Cancer Staging Malignant neoplasm of cervix [facility]) Staging form:  Cervix Uteri, AJCC Version 9 - Clinical stage from 09/11/2019: FIGO Stage IIIC2r, calculated as Stage IIIC2 (cT1b2, cN2, cM0) - Signed by Earlie Server, MD on 09/11/2019 - Pathologic stage from 05/20/2020: FIGO Stage IVB (pTX, pNX, pM1) - Signed by Earlie Server, MD on 05/20/2020  #Metastatic cervix cancer, currently on first-line chemotherapy treatment with carboplatin, Taxol, bevacizumab. Labs are reviewed and discussed with patient. I had a lengthy discussion with patient and her daughter.  Patient adamantly declines further treatment at this point.  She wants to see how her disease response to the chemotherapy treatment.  Regardless the result of the restaging results, she would not want to proceed with any additional treatments at this point.  She understands that her disease or not curable and without chemotherapy treatments, disease progression is anticipated. Patient prefers to have PET scan for reevaluation.  Orders were placed. I asked the patient if she would like to establish care with palliative care service and she declined at this point. She will follow up with me a few days after her PET scan for further discussion.1  #Dysuria, check urine and urine culture. We spent sufficient time to discuss many aspect of care, questions were answered to patient's satisfaction.Spanish interpreter was present for the entire encounter   All questions were answered. The patient knows to call the clinic with any problems questions or concerns.  Return of visit: A few days after PET scan.`  Earlie Server, MD, PhD Hematology Oncology [facility] Pager- [phone] 07/31/2020

## 2020-08-06 ENCOUNTER — Other Ambulatory Visit: Payer: Self-pay | Admitting: Oncology

## 2020-08-08 ENCOUNTER — Other Ambulatory Visit: Payer: Self-pay

## 2020-08-08 ENCOUNTER — Encounter
Admission: RE | Admit: 2020-08-08 | Discharge: 2020-08-08 | Disposition: A | Discharge status: Home / Self Care | Payer: BC Managed Care – PPO | Source: Ambulatory Visit | Attending: Oncology | Admitting: Oncology

## 2020-08-08 DIAGNOSIS — C538 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 uteri: Secondary | ICD-10-CM | POA: Insufficient documentation

## 2020-08-08 LAB — GLUCOSE, CAPILLARY: Glucose-Capillary: 99 mg/dL (ref 70–99)

## 2020-08-08 MED ORDER — FLUDEOXYGLUCOSE F - 18 (FDG) INJECTION
10.1100 | Freq: Once | INTRAVENOUS | Status: AC | PRN
  Administered 2020-08-08: 10.11 via INTRAVENOUS

## 2020-08-12 ENCOUNTER — Other Ambulatory Visit: Payer: Self-pay

## 2020-08-12 ENCOUNTER — Telehealth: Payer: Self-pay

## 2020-08-12 ENCOUNTER — Inpatient Hospital Stay (HOSPITAL_BASED_OUTPATIENT_CLINIC_OR_DEPARTMENT_OTHER): Payer: BC Managed Care – PPO | Admitting: Oncology

## 2020-08-12 ENCOUNTER — Encounter: Payer: Self-pay | Admitting: Oncology

## 2020-08-12 VITALS — BP 129/76 | HR 79 | Temp 98.8°F | Resp 20 | Wt 170.8 lb

## 2020-08-12 DIAGNOSIS — C538 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 uteri: Secondary | ICD-10-CM | POA: Diagnosis not present

## 2020-08-12 DIAGNOSIS — Z7189 Other specified counseling: Secondary | ICD-10-CM

## 2020-08-12 NOTE — Telephone Encounter (Signed)
[employer] order form/request on specimen ID #[id number] faxed to pathology.

## 2020-08-12 NOTE — Progress Notes (Signed)
Hematology/Oncology follow up note [facility] [phone] [phone]   Patient Care Team: Crissie Figures, PA-C as PCP - General (Physician Assistant) Clent Jacks, RN as Oncology Nurse Navigator  REFERRING PROVIDER: Crissie Figures, PA-C  CHIEF COMPLAINTS/REASON FOR VISIT:  Follow up for cervical cancer, treatment related symptoms.  HISTORY OF PRESENTING ILLNESS:   Jill Rodriguez is a  60 y.o.  female with PMH listed below was seen in consultation at the request of  Crissie Figures, PA-C  for evaluation of cervical cancer.   Patient is G3, P3 Spanish-speaking postmenopausal female who initially presented with vaginal bleeding.  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  No intervention seen.  08/23/2019, pelvis ultrasound showed endometrium measures 8 mm.  Echogenic endometrial microcalcification seen throughout endometrium.  Hypoechoic area in the fundal aspect of the endometrium measures 0.8 x 1.1 x 2.3 cm Survey of the adnexa showed no adnexal masses.  Patient has history of prominent right neck pelvis and CT angio neck has been ordered. Patient was seen by Dr. Fransisca Connors, clinically the tumor is 3 cm and replaced whole cervix. 09/06/2019 PET scan showed hypermetabolic mass involving the cervix and the lower uterine segment consistent with known cervical cancer. Multiple small hypermetabolic retroperitoneal and pelvic lymph node bilaterally consistent with nodal metastasis. She also has small hypermetabolic lymph nodes in the left superior mediastinum and left supraclavicular regions are also suspicious for nodal metastasis.  Hepatic steatosis no cholelithiasis noted.  At least Stage IIIc Cervix cancer, with small hypermetabolic lymph  nodes in the left superior mediastinum and left supraclavicular region which could put her to stage IV disease. hypermetabolic activity initially improved after concurrent chemoradiation.  # Cancer Treatment:  July 2021 Finished concurrent chemoradiation with weekly Cisplatin.   # 02/06/2020 repeat PET scan showed recurrent hypermetabolic left mediastinum lymph node.  No additional sites of recurrent hypermetabolic metastasis.  Residual mild hypermetabolism within the uterine cervix, slightly decreased.  2 small lung nodules with activity below PET resolution. Nonspecific persistent mild hypermetabolic 7 in the lower thoracic esophageal without discrete mass.  Diffuse hepatic steatosis, cholelithiasis atherosclerosis # She underwent EUS for further evaluation of esophageal hypermetabolic activity as well attempt to get biopsy, not feasible  #PET scan done at [facility], 04/03/2020, PET scan showed subcentimeter hypermetabolic cervical, mediastinal, retrocrural lymph nodes, concerning for lymphatic metastasis.  Multiple tiny indeterminate pulmonary nodule below the resolution of PET.  No abnormal FDG uptake within the cervix at the site of known primary malignancy. Patient has also developed a more supraclavicular fullness.  Patient was seen by Dr. Fransisca Connors on 05/01/2020.  Decision was made to proceed with biopsy of the left supraclavicular lymph node.   INTERVAL HISTORY Jill Rodriguez is a 60 y.o. female who has above history reviewed by me today presents for stage IV cervical cancer. Patient had a PET scan done and presents to discuss results and future management plan.  She was accompanied by her daughter.    Review of Systems  Constitutional: Positive for fatigue. Negative for appetite change, chills, fever and unexpected weight change.  HENT:   Negative for hearing loss and voice change.   Eyes: Negative for eye problems.  Respiratory: Negative for chest tightness and cough.   Cardiovascular:  Negative for chest pain.  Gastrointestinal: Negative for abdominal distention, abdominal  pain, blood in stool, nausea and vomiting.  Endocrine: Negative for hot flashes.  Genitourinary: Negative for difficulty urinating, dysuria and frequency.   Musculoskeletal: Negative for arthralgias.  Skin: Negative for itching and rash.  Neurological: Positive for numbness. Negative for extremity weakness.  Hematological: Negative for adenopathy.  Psychiatric/Behavioral: Negative for confusion.    MEDICAL HISTORY:  Past Medical History:  Diagnosis Date  . Cancer ([place])    cervical CA, that spread to lymph nodes; last chemo was 12/08/2019; radiation 01/2020  . COVID-19 10/2018  . Nausea with vomiting 10/31/2019  . No pertinent past medical history     SURGICAL HISTORY: Past Surgical History:  Procedure Laterality Date  . ESOPHAGOGASTRODUODENOSCOPY (EGD) WITH PROPOFOL N/A 03/28/2020   Procedure: ESOPHAGOGASTRODUODENOSCOPY (EGD) WITH PROPOFOL;  Surgeon: Milus Banister, MD;  Location: [facility] ENDOSCOPY;  Service: Endoscopy;  Laterality: N/A;  . EUS N/A 03/28/2020   Procedure: UPPER ENDOSCOPIC ULTRASOUND (EUS) RADIAL;  Surgeon: Milus Banister, MD;  Location: [facility] ENDOSCOPY;  Service: Endoscopy;  Laterality: N/A;  . no surgical history      SOCIAL HISTORY: Social History   Socioeconomic History  . Marital status: Married    Spouse name: Rosezetta Schlatter   . Number of children: 3  . Years of education: Not on file  . Highest education level: Not on file  Occupational History  . Not on file  Tobacco Use  . Smoking status: Never Smoker  . Smokeless tobacco: Never Used  Vaping Use  . Vaping Use: Never used  Substance and Sexual Activity  . Alcohol use: Never  . Drug use: Never  . Sexual activity: Yes    Birth control/protection: None  Other Topics Concern  . Not on file  Social History Narrative   Lives at home with spouse ; daughter [name] comes & stays when needed   Social Determinants of Health    Financial Resource Strain: Not on file  Food Insecurity: Not on file  Transportation Needs: Not on file  Physical Activity: Not on file  Stress: Not on file  Social Connections: Not on file  Intimate Partner Violence: Not on file    FAMILY HISTORY: Family History  Problem Relation Age of Onset  . Blindness Mother   . Glaucoma Mother   . Diabetes Father   . Diabetes Brother     ALLERGIES:  has No Known Allergies.  MEDICATIONS:  Current Outpatient Medications  Medication Sig Dispense Refill  . dexamethasone (DECADRON) 4 MG tablet Take 2 tablets (8 mg total) by mouth See admin instructions. Take 8mg  daily for 2 days after each chemotherapy-cisplatin treatments. 24 tablet 0  . OLANZapine (ZYPREXA) 10 MG tablet Take 1 tablet (10 mg total) by mouth at bedtime. 30 tablet 0  . ondansetron (ZOFRAN) 8 MG tablet Take 1 tablet (8 mg total) by mouth 2 (two) times daily as needed for refractory nausea / vomiting. Start on day 3 after carboplatin chemo. 30 tablet 1  . prochlorperazine (COMPAZINE) 10 MG tablet Take 1 tablet (10 mg total) by mouth every 6 (six) hours as needed (Nausea or vomiting). 30 tablet 1  . promethazine (PHENERGAN) 25 MG suppository Place 1 suppository (25 mg total) rectally every 6 (six) hours as needed for nausea, vomiting or refractory nausea / vomiting. 120 each 3  . traMADol (ULTRAM) 50 MG tablet Take 1 tablet (50 mg total) by mouth every 12 (twelve) hours as needed. 60 tablet 0  . dexamethasone (DECADRON) 4 MG tablet Take 2 tablets (8 mg  total) by mouth daily. Start the day after carboplatin chemotherapy for 3 days. (Patient not taking: Reported on 08/12/2020) 30 tablet 1  . loperamide (IMODIUM) 2 MG capsule Take 1 capsule (2 mg total) by mouth See admin instructions. Take 2 capsules with onset of diarrhea. Then take 1 capsule after every loose bowel movements. Maximum 8 capsules per 24 hours. (Patient not taking: No sig reported) 60 capsule 0  . nystatin (MYCOSTATIN)  100000 UNIT/ML suspension Use as directed 5 mLs (500,000 Units total) in the mouth or throat 4 (four) times daily. (Patient not taking: No sig reported) 120 mL 0  . omeprazole (PRILOSEC) 20 MG capsule Take 1 capsule (20 mg total) by mouth daily. (Patient not taking: No sig reported) 30 capsule 1  . ondansetron (ZOFRAN ODT) 4 MG disintegrating tablet Take 1 tablet (4 mg total) by mouth every 8 (eight) hours as needed. (Patient not taking: No sig reported) 20 tablet 0  . senna (SENOKOT) 8.6 MG TABS tablet Take 2 tablets (17.2 mg total) by mouth daily. (Patient not taking: No sig reported) 120 tablet 0  . sulfamethoxazole-trimethoprim (BACTRIM DS) 800-160 MG tablet Take 1 tablet by mouth 2 (two) times daily. (Patient not taking: No sig reported) 10 tablet 0   No current facility-administered medications for this visit.     PHYSICAL EXAMINATION: ECOG PERFORMANCE STATUS: 1 - Symptomatic but completely ambulatory Vitals:   08/12/20 1047  BP: 129/76  Pulse: 79  Resp: 20  Temp: 98.8 F (37.1 C)  SpO2: 96%   Filed Weights   08/12/20 1047  Weight: 170 lb 12.8 oz (77.5 kg)    Physical Exam Constitutional:      General: She is not in acute distress. HENT:     Head: Normocephalic and atraumatic.  Eyes:     General: No scleral icterus. Cardiovascular:     Rate and Rhythm: Normal rate and regular rhythm.     Heart sounds: Normal heart sounds.  Pulmonary:     Effort: Pulmonary effort is normal. No respiratory distress.     Breath sounds: No wheezing.  Abdominal:     General: Bowel sounds are normal. There is no distension.     Palpations: Abdomen is soft.  Musculoskeletal:        General: No deformity. Normal range of motion.     Cervical back: Normal range of motion and neck supple.  Lymphadenopathy:     Cervical: Cervical adenopathy present.  Skin:    General: Skin is warm and dry.     Findings: No erythema or rash.  Neurological:     Mental Status: She is alert and oriented to  person, place, and time. Mental status is at baseline.     Cranial Nerves: No cranial nerve deficit.     Coordination: Coordination normal.  Psychiatric:        Mood and Affect: Mood normal.     LABORATORY DATA:  I have reviewed the data as listed Lab Results  Component Value Date   WBC 4.6 07/31/2020   HGB 11.4 (L) 07/31/2020   HCT 34.7 (L) 07/31/2020   MCV 97.2 07/31/2020   PLT 214 07/31/2020   Recent Labs    11/02/19 0803 11/07/19 1054 01/29/20 1738 05/23/20 1504 06/14/20 1254 06/20/20 0834 07/31/20 0816  NA 139 138 138   < > 138 139 138  K 3.4* 3.9 3.6   < > 3.8 3.8 3.5  CL 106 103 103   < > 104 108 108  CO2 24 28 25    < > 23 21* 21*  GLUCOSE 139* 155* 101*   < > 140* 179* 183*  BUN 19 24* 10   < > 15 14 17   CREATININE 0.58 0.80 0.57   < > 0.65 0.65 0.72  CALCIUM 8.3* 8.5* 9.4   < > 8.5* 8.6* 9.0  GFRNONAA >60 >60 >60   < > >60 >60 >60  GFRAA >60 >60 >60  --   --   --   --   PROT 6.6 6.7 7.9   < > 7.2 7.3 6.8  ALBUMIN 3.6 3.6 3.9   < > 3.7 3.8 3.5  AST 21 33 38   < > 22 32 32  ALT 29 71* 27   < > 28 26 21   ALKPHOS 76 76 73   < > 91 105 95  BILITOT 0.4 0.5 0.6   < > 0.4 0.4 0.5   < > = values in this interval not displayed.   Iron/TIBC/Ferritin/ %Sat No results found for: IRON, TIBC, FERRITIN, IRONPCTSAT    RADIOGRAPHIC STUDIES: I have personally reviewed the radiological images as listed and agreed with the findings in the report. NM PET Image Restag (PS) Skull Base To Thigh  Result Date: 08/09/2020 CLINICAL DATA:  Subsequent treatment strategy for metastatic cervical cancer. EXAM: NUCLEAR MEDICINE PET SKULL BASE TO THIGH TECHNIQUE: 10.11 mCi F-18 FDG was injected intravenously. Full-ring PET imaging was performed from the skull base to thigh after the radiotracer. CT data was obtained and used for attenuation correction and anatomic localization. Fasting blood glucose: 99 mg/dl COMPARISON:  PET-CT 02/06/2020 FINDINGS: Mediastinal blood pool activity: SUV max  2.04 Liver activity: SUV max NA NECK: No hypermetabolic lymph nodes in the neck. Incidental CT findings: none CHEST: No hypermetabolic mediastinal or hilar nodes. No suspicious pulmonary nodules on the CT scan. Incidental CT findings: Stable scattered vascular calcifications. No worrisome pulmonary nodules. ABDOMEN/PELVIS: No abnormal hypermetabolic activity within the liver, pancreas, adrenal glands, or spleen. No hypermetabolic lymph nodes in the abdomen or pelvis. Incidental CT findings: Stable cholelithiasis. Stable scattered vascular calcifications. SKELETON: No significant bony findings. Incidental CT findings: none IMPRESSION: No findings suspicious for recurrent uterine/cervical cancer or metastatic disease. Electronically Signed   By: Marijo Sanes M.D.   On: 08/09/2020 13:24      ASSESSMENT & PLAN:  1. 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 ([place])   2. Goals of care, counseling/discussion   Cancer Staging Malignant neoplasm of cervix [facility]) Staging form: Cervix Uteri, AJCC Version 9 - Clinical stage from 09/11/2019: FIGO Stage IIIC2r, calculated as Stage IIIC2 (cT1b2, cN2, cM0) - Signed by Earlie Server, MD on 09/11/2019 - Pathologic stage from 05/20/2020: FIGO Stage IVB (pTX, pNX, pM1) - Signed by Earlie Server, MD on 05/20/2020  #Metastatic cervix cancer, currently on first-line chemotherapy treatment with carboplatin, Taxol, bevacizumab. Online interpreter service for Spanish was utilized for this encounter. 08/08/2020, PET scan showed resolution of previous hypermetabolic activity.  No findings suspicious for metastatic cancer. Discussed with patient and daughter that she has NED, cancer may recur in the future. I recommend additional 2 cycles of carboplatin and Taxol/bevacizumab to further consolidate and she may be switched to maintenance I will check NGS panel to see if there will be any role for incorporation of pembrolizumab as maintenance. Patient is reluctant with additional  treatments.  She would like to consider and update me.  Other option will be observation at this point, repeat image in 3  months for surveillance.  Resume treatments if signs of recurrence.  Patient would like to consider and discuss further with family members.  Patient/daughter will update me regarding her decision and follow-up plan is pending on her decision. We spent sufficient time to discuss many aspect of care, questions were answered to patient's satisfaction.Spanish interpreter was present for the entire encounter   All questions were answered. The patient knows to call the clinic with any problems questions or concerns.  Return of visit: TBD  Earlie Server, MD, PhD Hematology Oncology [facility] Pager- [phone] 08/12/2020

## 2020-08-14 ENCOUNTER — Telehealth: Payer: Self-pay | Admitting: *Deleted

## 2020-08-14 ENCOUNTER — Other Ambulatory Visit: Payer: Self-pay

## 2020-08-14 DIAGNOSIS — C538 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 uteri: Secondary | ICD-10-CM

## 2020-08-14 NOTE — Telephone Encounter (Signed)
Jill Rodriguez called stating that patient has made her decision and does not want any more treatment right now and prefers to wait the 3 months and have PET scan and see where things are at that point. She does not have follow up appointment with Dr Tasia Catchings. She also requested a return call

## 2020-08-14 NOTE — Telephone Encounter (Signed)
Per Dr. Tasia Catchings, pt to have CT ch/a/p in 3 mo and then if anything shows up on CT, she will order PET.   Please schedule patient for CT ch/a/p in approx 3 months and see MD a few days after CT. Spoke to [place] and she is aware of plan, please call her when appts have been scheduled. Pt will need INTERPRETER.

## 2020-08-15 ENCOUNTER — Telehealth: Payer: Self-pay | Admitting: *Deleted

## 2020-08-15 NOTE — Telephone Encounter (Signed)
Daisy notified of appts. Will mail appt reminders as well.

## 2020-08-15 NOTE — Telephone Encounter (Signed)
absolutely

## 2020-08-15 NOTE — Telephone Encounter (Signed)
Patient asking if she can go back to eating seafood and using metal utensils now that she is not taking treatment any longer. Please advise

## 2020-08-15 NOTE — Telephone Encounter (Signed)
Daisy notified.

## 2020-08-23 NOTE — Telephone Encounter (Signed)
Results received, copy given to MD as well as copy sent to be scanned in chart.

## 2020-08-24 ENCOUNTER — Encounter: Payer: Self-pay | Admitting: Oncology

## 2020-08-26 ENCOUNTER — Encounter: Payer: Self-pay | Admitting: Oncology

## 2020-08-28 ENCOUNTER — Inpatient Hospital Stay: Payer: BC Managed Care – PPO | Attending: Obstetrics and Gynecology | Admitting: Obstetrics and Gynecology

## 2020-08-28 ENCOUNTER — Other Ambulatory Visit: Payer: Self-pay

## 2020-08-28 ENCOUNTER — Encounter: Payer: Self-pay | Admitting: Obstetrics and Gynecology

## 2020-08-28 VITALS — BP 130/74 | HR 79 | Temp 97.8°F | Resp 20 | Wt 189.3 lb

## 2020-08-28 DIAGNOSIS — Z8616 Personal history of COVID-19: Secondary | ICD-10-CM | POA: Insufficient documentation

## 2020-08-28 DIAGNOSIS — Z780 Asymptomatic menopausal state: Secondary | ICD-10-CM | POA: Insufficient documentation

## 2020-08-28 DIAGNOSIS — Z79899 Other long term (current) drug therapy: Secondary | ICD-10-CM | POA: Insufficient documentation

## 2020-08-28 DIAGNOSIS — Z9221 Personal history of antineoplastic chemotherapy: Secondary | ICD-10-CM | POA: Insufficient documentation

## 2020-08-28 DIAGNOSIS — Z8541 Personal history of malignant neoplasm of cervix uteri: Secondary | ICD-10-CM | POA: Diagnosis not present

## 2020-08-28 DIAGNOSIS — Z923 Personal history of irradiation: Secondary | ICD-10-CM | POA: Diagnosis not present

## 2020-08-28 DIAGNOSIS — C539 Malignant neoplasm of cervix uteri, unspecified: Secondary | ICD-10-CM

## 2020-08-28 NOTE — Progress Notes (Signed)
Patient daughter states patient is having a hard time sleeping and patient states she has pain on lower back/hip area for the past two weeks.

## 2020-08-28 NOTE — Progress Notes (Signed)
dGynecologic Oncology Consult Visit   Referring Provider: Dr. 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 married P3 female who is seen in consultation from Dr. Thurnell Garbe for cervical cancer.  Accompanied by her daughter and interpreter present.   Since I last saw her 4 months ago. She was found to have metastatic cervix cancer in [facility] SCN 05/09/20, treated with chemotherapy with carboplatin, Taxol, bevacizumab. She only got two cycles of chemotherapy and went to [place].  Came back and 08/08/2020 PET scan showed resolution of previous hypermetabolic activity.  No findings suspicious for metastatic cancer.  Dr Tasia Catchings discussed with patient and daughter that she was NED, cancer may recur in the future.  Recommended additional 2 cycles of carboplatin and Taxol/bevacizumab to further consolidate and she may be switched to maintenance bev.  Patient declined further therapy at present.   Checked NGS panel to see if there will be any role for incorporation of pembrolizumab as maintenance, but tissue was insufficient.  She is feeling well.  No complaints. Appetite good.  No pain.   Gynecologic Oncology History Bleeding for years.  She had COVID in July 2020 and was supposed to have follow up for long term vaginal bleeding but not seen until 4/21.  Patient seen by Dr Marcelline Mates 08/16/19 for PMB. " [id number] Spanish speaking post-menopausal female who presents as a referral from [facility] for concerns regarding postmenopausal bleeding. She has been menopausal since age 79. She has never been on HRT. Bleeding has been ongoing for the past several months per records from the past visit, however patient reports that it has been off and on for the past year. She reports passage of clots at times, and otherwise sometimes is just spotting.  Bleeding can last from days to weeks and can range from light to moderate flow.  Associated crampy pelvic/abdominal pain.  Workup to date: CBC and TSH.  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 No endometrium seen. CERVIX:friable, lesion present: cervix with irregularly shaped growth that is possibly eroding the cervix, firm.  A. ENDOMETRIUM, BIOPSY:  Poorly differentiated squamous cell carcinoma.  COMMENT:  There are fragments of poorly differentiated squamous cell carcinoma. Endometrial tissue is not identified.  PDL1=0  08/23/19 Pelvic US The Endometriummeasures 8 mm. Echogenic endometrial microcalcifications seen through -out endometrium. An hypoechoic area seen in the fundal aspect of the endometrium measuring 0.8 x 1.1 x 2.3 cm Right Ovary measures 1.6 x 1.1 x 1.2 cm. It is normal in appearance. Left Ovary measures 2.2 x 1.5 x 1.7 cm. It is normal in appearance. Survey of the adnexa demonstrates no adnexal masses. There is no free fluid in the cul de sac. Impression: 1. Endometrial microcalcifications as described above with a hypoechoic area most likely retain blood in the fundal aspect of the endometrium. 2. Survey of the adnexa demonstrates no adnexal masses.  Last pap smear: 2019.  Patient denies history of abnormal pap smears.  Mammogram: 2015.  Ordered by PCP in December 2020.   She has prominent right neck pulse and CT angio neck was ordered for evaluation.  09/13/19 1. Positive for generalized arterial tortuosity, but negative for vascular malformation, aneurysm, soft tissue mass, or metastatic disease in the neck.  2. Minimal atherosclerosis in the neck, no arterial stenosis. Dominant left vertebral artery (normal variant).  09/06/2019 PET scan showed hypermetabolic mass involving the cervix and the lower uterine segment consistent with known cervical  cancer. Multiple small hypermetabolic retroperitoneal and pelvic lymph node bilaterally consistent with nodal metastasis. She also has small hypermetabolic lymph nodes in the left superior mediastinum and left supraclavicular regions are also suspicious for nodal metastasis.  Hepatic steatosis no cholelithiasis noted.  At least Stage IIIc Cervix cancer, with small hypermetabolic lymph nodes in the left superior mediastinum and left supraclavicular region which could put her to stage IV disease.  Treated with chemoradiation with external radiation and weekly cisplatin and brachytherapy. The hypermetabolic activity improved after concurrent chemoradiation.   7/21 PET/CT IMPRESSION: 1. Marked decrease in hypermetabolism associated with the cervix and lower uterine segment consistent with interval response to therapy. 2. The multiple small hypermetabolic retroperitoneal and pelvic sidewall lymph nodes seen on the previous study have decreased in size and hypermetabolism in the interval. FDG uptake in most of these nodes today is at blood pool levels. Findings consistent with response to therapy. 3. Left supraclavicular and mediastinal hypermetabolic nodes have resolved. 4. Increased FDG accumulation in the distal esophagus. No mass lesion evident by CT. This may reflect esophagitis, but direct visualization may be warranted. 5. Hepatic steatosis. 6. Cholelithiasis. 7.  Aortic Atherosclerois (ICD10-170.0)  02/06/2020 repeat PET scan showed recurrent hypermetabolic left mediastinum lymph node.  No additional sites of recurrent hypermetabolic metastasis.  Residual mild hypermetabolism within the uterine cervix, slightly decreased.  2 small lung nodules with activity below PET resolution. Nonspecific persistent mild hypermetabolic 7 in the lower thoracic esophageal without discrete mass.  Diffuse hepatic steatosis, cholelithiasis atherosclerosis. Dr. Tasia Catchings recommend patient to establish care with  gastroenterology for EGD for direct visualization of the mild hypermetabolic lower thoracic esophagus to rule out esophageal etiology.  04/02/20 EGD biopsy attempted by GI in [place], but not technically possible.    Saw Dr Christel Mormon 03/27/20 and PET scan 04/03/20 IMPRESSION: 1. Subcentimeter hypermetabolic cervical, mediastinal, and retrocrural lymph nodes, concerning for lymphatic metastases. 2. Multiple tiny indeterminate pulmonary nodules below the resolution of PET. Recommend continued attention on follow-up. 3. No abnormal FDG uptake within the cervix at site of known primary malignancy, as can be seen with post-treatment change.  Problem List: Patient Active Problem List   Diagnosis Date Noted  . Dysuria 07/31/2020  . Chemotherapy induced neutropenia ([place]) 06/14/2020  . Hypocalcemia 06/14/2020  . Normocytic anemia 06/14/2020  . Thoracic lymphadenopathy 05/20/2020  . Chemotherapy induced nausea and vomiting 10/31/2019  . Heartburn 10/17/2019  . Encounter for antineoplastic chemotherapy 10/10/2019  . Non-intractable vomiting 10/10/2019  . Thrush 10/10/2019  . Goals of care, counseling/discussion 09/20/2019  . Malignant neoplasm of cervix ([facility]) 08/30/2019    Past Medical History: Past Medical History:  Diagnosis Date  . Cancer ([place])    cervical CA, that spread to lymph nodes; last chemo was 12/08/2019; radiation 01/2020  . COVID-19 10/2018  . Nausea with vomiting 10/31/2019  . No pertinent past medical history     Past Surgical History: Past Surgical History:  Procedure Laterality Date  . ESOPHAGOGASTRODUODENOSCOPY (EGD) WITH PROPOFOL N/A 03/28/2020   Procedure: ESOPHAGOGASTRODUODENOSCOPY (EGD) WITH PROPOFOL;  Surgeon: Milus Banister, MD;  Location: [facility];  Service: Endoscopy;  Laterality: N/A;  . EUS N/A 03/28/2020   Procedure: UPPER ENDOSCOPIC ULTRASOUND (EUS) RADIAL;  Surgeon: Milus Banister, MD;  Location: [facility];  Service: Endoscopy;  Laterality: N/A;   .  no surgical history      OB History:  OB History  Gravida Para Term Preterm AB Living  _0 SAB IAB Ectopic Multiple Live Births          3    # Outcome Date GA Lbr Len/2nd Weight Sex Delivery Anes PTL Lv  3 Term 84    F Vag-Spont   LIV  2 Term 1985    F Vag-Spont   LIV  1 Term 51    M Vag-Spont   LIV    Family History: Family History  Problem Relation Age of Onset  . Blindness Mother   . Glaucoma Mother   . Diabetes Father   . Diabetes Brother     Social History: Social History   Socioeconomic History  . Marital status: Married    Spouse name: Rosezetta Schlatter   . Number of children: 3  . Years of education: Not on file  . Highest education level: Not on file  Occupational History  . Not on file  Tobacco Use  . Smoking status: Never Smoker  . Smokeless tobacco: Never Used  Vaping Use  . Vaping Use: Never used  Substance and Sexual Activity  . Alcohol use: Never  . Drug use: Never  . Sexual activity: Yes    Birth control/protection: None  Other Topics Concern  . Not on file  Social History Narrative   Lives at home with spouse ; daughter [name] comes & stays when needed   Social Determinants of Health   Financial Resource Strain: Not on file  Food Insecurity: Not on file  Transportation Needs: Not on file  Physical Activity: Not on file  Stress: Not on file  Social Connections: Not on file  Intimate Partner Violence: Not on file    Allergies: No Known Allergies  Current Medications: Current Outpatient Medications  Medication Sig Dispense Refill  . dexamethasone (DECADRON) 4 MG tablet Take 2 tablets (8 mg total) by mouth See admin instructions. Take 37m daily for 2 days after each chemotherapy-cisplatin treatments. 24 tablet 0  . dexamethasone (DECADRON) 4 MG tablet Take 2 tablets (8 mg total) by mouth daily. Start the day after carboplatin chemotherapy for 3 days. (Patient not taking: Reported on 08/12/2020) 30 tablet 1  . loperamide  (IMODIUM) 2 MG capsule Take 1 capsule (2 mg total) by mouth See admin instructions. Take 2 capsules with onset of diarrhea. Then take 1 capsule after every loose bowel movements. Maximum 8 capsules per 24 hours. (Patient not taking: No sig reported) 60 capsule 0  . nystatin (MYCOSTATIN) 100000 UNIT/ML suspension Use as directed 5 mLs (500,000 Units total) in the mouth or throat 4 (four) times daily. (Patient not taking: No sig reported) 120 mL 0  . OLANZapine (ZYPREXA) 10 MG tablet Take 1 tablet (10 mg total) by mouth at bedtime. 30 tablet 0  . omeprazole (PRILOSEC) 20 MG capsule Take 1 capsule (20 mg total) by mouth daily. (Patient not taking: No sig reported) 30 capsule 1  . ondansetron (ZOFRAN ODT) 4 MG disintegrating tablet Take 1 tablet (4 mg total) by mouth every 8 (eight) hours as needed. (Patient not taking: No sig reported) 20 tablet 0  . ondansetron (ZOFRAN) 8 MG tablet Take 1 tablet (8 mg total) by mouth 2 (two) times daily as needed for refractory nausea / vomiting. Start on day 3 after carboplatin chemo. 30 tablet 1  . prochlorperazine (COMPAZINE) 10 MG tablet Take 1  tablet (10 mg total) by mouth every 6 (six) hours as needed (Nausea or vomiting). 30 tablet 1  . promethazine (PHENERGAN) 25 MG suppository Place 1 suppository (25 mg total) rectally every 6 (six) hours as needed for nausea, vomiting or refractory nausea / vomiting. 120 each 3  . senna (SENOKOT) 8.6 MG TABS tablet Take 2 tablets (17.2 mg total) by mouth daily. (Patient not taking: No sig reported) 120 tablet 0  . sulfamethoxazole-trimethoprim (BACTRIM DS) 800-160 MG tablet Take 1 tablet by mouth 2 (two) times daily. (Patient not taking: No sig reported) 10 tablet 0  . traMADol (ULTRAM) 50 MG tablet Take 1 tablet (50 mg total) by mouth every 12 (twelve) hours as needed. 60 tablet 0   No current facility-administered medications for this visit.    Review of Systems General: negative for, fevers, chills, fatigue, changes in  sleep, changes in weight or appetite Skin: negative for changes in color, texture, moles or lesions Eyes: negative for, changes in vision, pain, diplopia Pulmonary: negative for, dyspnea, orthopnea, productive cough Cardiac: negative for, palpitations, syncope, pain, discomfort, pressure Gastrointestinal: negative for, dysphagia, nausea, vomiting, jaundice, pain, constipation, diarrhea, hematemesis, hematochezia Musculoskeletal: negative for, pain, stiffness, swelling, range of motion limitation Hematology: negative for, easy bruising Neurologic/Psych: negative for, headaches, seizures, paralysis, weakness, tremor, change in gait, change in sensation, mood swings, depression, anxiety, change in memory  Objective:  Physical Examination:  BP 130/74   Pulse 79   Temp 97.8 F (36.6 C)   Resp 20   Wt 189 lb 4.8 oz (85.9 kg)   SpO2 100%   BMI 35.77 kg/m    ECOG Performance Status: 1 - Symptomatic but completely ambulatory  General appearance: alert, cooperative and appears stated age HEENT:PERRLA and thyroid without masses Neck: visible bounding pulse in right neck, as previously.  No bruit appreciated. Lymph node survey: non-palpable, axillary, inguinal, supraclavicular Cardiovascular: regular rate and rhythm, no murmurs or gallops Respiratory: normal air entry, lungs clear to auscultation and no rales, rhonchi or wheezing Breast exam: not examined. Abdomen: no hernias and well healed incision Back: inspection of back is normal Extremities: extremities normal, atraumatic, no cyanosis or edema Skin exam - normal coloration and turgor, no rashes, no suspicious skin lesions noted. Neurological exam reveals alert, oriented, normal speech, no focal findings or movement disorder noted.  Pelvic: exam chaperoned by nurse;  Vulva: normal appearing vulva with no masses, tenderness or lesions; Vagina: normal vagina; Adnexa: normal adnexa in size, nontender and no masses; Uterus: uterus is  normal size, shape, consistency and nontender; Cervix: no lesions; Rectal: confirms  Lab Review Labs on site today: Lab Results  Component Value Date   WBC 4.6 07/31/2020   HGB 11.4 (L) 07/31/2020   HCT 34.7 (L) 07/31/2020   MCV 97.2 07/31/2020   PLT 214 07/31/2020       Assessment:  Jill Rodriguez is a 60 y.o.  female diagnosed with poorly differentiated squamous cell cervical cancer 4/21 after at least 8 month delay in work up of postmenopausal bleeding due to her getting COVID and other issues. The cancer was about 3 cm in diameter and replaced the whole cervix.  At least Stage IIIc cervix cancer, with small hypermetabolic lymph nodes in the left superior mediastinum and left supraclavicular region on PET which could make her stage IV.  Treated with chemoradiation with external radiation and weekly cisplatin and then brachytherapy at [facility]. The hypermetabolic activity improved after concurrent chemoradiation.    PET/CT in 02/06/2020 showed  recurrent hypermetabolic left mediastinum lymph node. EGD for attempted for biopsy 04/02/20 was not technically possible.    Repeat PET/CT 04/03/20 subcentimeter hypermetabolic cervical, mediastinal, and retrocrural lymph nodes, concerning for lymphatic metastases. Concerning left neck adenopathy on exam.  Biopsy of L SCN 1/22 showed metastatic disease.  She got two cycles of carboplatin/taxol and bevacizumab.  Decided to stop treatment and PET scan 4/22 showed no persistent disease.  Do not feel any enlarged left SCN adenopathy today.   NED  PDL1 = 0  Medical co-morbidities complicating care: none.  Plan:   Problem List Items Addressed This Visit      Genitourinary   Malignant neoplasm of cervix [facility]) - Primary     Patient will continue surveillance with Dr Tasia Catchings and another PET scan is scheduled for July.  We can see her back for follow up in 6 months, since she has had a complete response in the pelvis and has normal exam today.    The  patient's diagnosis, an outline of the further diagnostic and laboratory studies which will be required, the recommendation, and alternatives were discussed with the patient and daughter with interpreter present.  All questions were answered to the patient's satisfaction.  Mellody Drown, MD  CC:  Crissie Figures, PA-C [address] [place],  [place] [address] [phone]

## 2020-11-12 ENCOUNTER — Telehealth: Payer: Self-pay

## 2020-11-12 NOTE — Telephone Encounter (Signed)
Received message from Levert Feinstein, RT:  this patient is scheduled for a CT scan tomorrow morning but she is currently out of the country and stated she would call to reschedule when she returned.   Will cancel appt appt on 7/22, which was to follow up for CT resutls and get r/s once CT is r/s.

## 2020-11-13 ENCOUNTER — Ambulatory Visit: Admission: RE | Admit: 2020-11-13 | Payer: BC Managed Care – PPO | Source: Ambulatory Visit

## 2020-11-15 ENCOUNTER — Inpatient Hospital Stay: Payer: BC Managed Care – PPO | Admitting: Oncology

## 2020-12-09 ENCOUNTER — Other Ambulatory Visit: Payer: Self-pay

## 2020-12-09 ENCOUNTER — Encounter: Payer: Self-pay | Admitting: *Deleted

## 2020-12-09 ENCOUNTER — Encounter: Payer: Self-pay | Admitting: Oncology

## 2020-12-09 ENCOUNTER — Emergency Department
Admission: EM | Admit: 2020-12-09 | Discharge: 2020-12-10 | Disposition: A | Discharge status: Home / Self Care | Payer: BC Managed Care – PPO | Attending: Emergency Medicine | Admitting: Emergency Medicine

## 2020-12-09 DIAGNOSIS — Z8616 Personal history of COVID-19: Secondary | ICD-10-CM | POA: Insufficient documentation

## 2020-12-09 DIAGNOSIS — Z8541 Personal history of malignant neoplasm of cervix uteri: Secondary | ICD-10-CM | POA: Insufficient documentation

## 2020-12-09 DIAGNOSIS — K529 Noninfective gastroenteritis and colitis, unspecified: Secondary | ICD-10-CM

## 2020-12-09 DIAGNOSIS — U071 COVID-19: Secondary | ICD-10-CM | POA: Insufficient documentation

## 2020-12-09 LAB — URINALYSIS, COMPLETE (UACMP) WITH MICROSCOPIC
Bacteria, UA: NONE SEEN
Bilirubin Urine: NEGATIVE
Glucose, UA: NEGATIVE mg/dL
Hgb urine dipstick: NEGATIVE
Ketones, ur: NEGATIVE mg/dL
Nitrite: NEGATIVE
Protein, ur: NEGATIVE mg/dL
Specific Gravity, Urine: 1.006 (ref 1.005–1.030)
pH: 6 (ref 5.0–8.0)

## 2020-12-09 LAB — COMPREHENSIVE METABOLIC PANEL
ALT: 31 U/L (ref 0–44)
AST: 32 U/L (ref 15–41)
Albumin: 4.4 g/dL (ref 3.5–5.0)
Alkaline Phosphatase: 91 U/L (ref 38–126)
Anion gap: 7 (ref 5–15)
BUN: 22 mg/dL — ABNORMAL HIGH (ref 6–20)
CO2: 28 mmol/L (ref 22–32)
Calcium: 9.2 mg/dL (ref 8.9–10.3)
Chloride: 104 mmol/L (ref 98–111)
Creatinine, Ser: 0.73 mg/dL (ref 0.44–1.00)
GFR, Estimated: >60 mL/min (ref >60)
Glucose, Bld: 86 mg/dL (ref 70–99)
Potassium: 4.2 mmol/L (ref 3.5–5.1)
Sodium: 139 mmol/L (ref 135–145)
Total Bilirubin: 0.7 mg/dL (ref 0.3–1.2)
Total Protein: 8 g/dL (ref 6.5–8.1)

## 2020-12-09 LAB — LIPASE, BLOOD: Lipase: 27 U/L (ref 11–51)

## 2020-12-09 LAB — CBC
HCT: 41.7 % (ref 36.0–46.0)
Hemoglobin: 13.8 g/dL (ref 12.0–15.0)
MCH: 31.8 pg (ref 26.0–34.0)
MCHC: 33.1 g/dL (ref 30.0–36.0)
MCV: 96.1 fL (ref 80.0–100.0)
Platelets: 245 10*3/uL (ref 150–400)
RBC: 4.34 MIL/uL (ref 3.87–5.11)
RDW: 13.4 % (ref 11.5–15.5)
WBC: 5.7 10*3/uL (ref 4.0–10.5)
nRBC: 0 % (ref 0.0–0.2)

## 2020-12-09 NOTE — ED Triage Notes (Addendum)
Pt has been to [place] recently.  Pt has n/v/d.  Pt has cancer of cervix and is treated at [facility].  Pt reports feeling weak.  Pt alert speech clear.  Interpreter on a stick used in [place]

## 2020-12-09 NOTE — ED Provider Notes (Signed)
[facility] Emergency Department Provider Note  ____________________________________________   Event Date/Time   First MD Initiated Contact with Patient 12/09/20 2357     (approximate)  I have reviewed the triage vital signs and the nursing notes.   HISTORY  Chief Complaint Nausea   HPI Jill Rodriguez is a 60 y.o. female with a past medical history of cervical cancer currently receiving chemotherapy with last infusion approximately 3 and half weeks ago who presents for assessment of headache, chills, nausea, decreased appetite, some crampy abdominal pain and nonbloody diarrhea over the last 8 days when patient returned from [place].  Patient states he was visiting family.  She has not had significant vomiting and denies any chest pain, cough, shortness of breath, back pain, urinary symptoms, rash or extremity pain.  No earache, sore throat, vision changes or other clear acute associated sick symptoms.  She has not been taking any medications for symptoms.  Denies any other acute concerns at this time.  Denies any blood in her stool.         Past Medical History:  Diagnosis Date   Cancer ([place])    cervical CA, that spread to lymph nodes; last chemo was 12/08/2019; radiation 01/2020   COVID-19 10/2018   Nausea with vomiting 10/31/2019   No pertinent past medical history     Patient Active Problem List   Diagnosis Date Noted   Dysuria 07/31/2020   Chemotherapy induced neutropenia ([place]) 06/14/2020   Hypocalcemia 06/14/2020   Normocytic anemia 06/14/2020   Thoracic lymphadenopathy 05/20/2020   Chemotherapy induced nausea and vomiting 10/31/2019   Heartburn 10/17/2019   Encounter for antineoplastic chemotherapy 10/10/2019   Non-intractable vomiting 10/10/2019   Thrush 10/10/2019   Goals of care, counseling/discussion 09/20/2019   Malignant neoplasm of cervix ([place]) 08/30/2019    Past Surgical History:  Procedure Laterality Date    ESOPHAGOGASTRODUODENOSCOPY (EGD) WITH PROPOFOL N/A 03/28/2020   Procedure: ESOPHAGOGASTRODUODENOSCOPY (EGD) WITH PROPOFOL;  Surgeon: Milus Banister, MD;  Location: [facility];  Service: Endoscopy;  Laterality: N/A;   EUS N/A 03/28/2020   Procedure: UPPER ENDOSCOPIC ULTRASOUND (EUS) RADIAL;  Surgeon: Milus Banister, MD;  Location: [facility];  Service: Endoscopy;  Laterality: N/A;   no surgical history      Prior to Admission medications   Medication Sig Start Date End Date Taking? Authorizing Provider  azithromycin (ZITHROMAX) 500 MG tablet Take 1 tablet (500 mg total) by mouth daily for 3 days. Take 1 tablet daily for 3 days. 12/10/20 12/13/20 Yes Lucrezia Starch, MD  dicyclomine (BENTYL) 10 MG capsule Take 1 capsule (10 mg total) by mouth 4 (four) times daily -  before meals and at bedtime for 3 days. 12/10/20 12/13/20 Yes Lucrezia Starch, MD  ondansetron (ZOFRAN) 4 MG tablet Take 1 tablet (4 mg total) by mouth every 8 (eight) hours as needed for up to 10 doses for nausea or vomiting. 12/10/20  Yes Lucrezia Starch, MD  dexamethasone (DECADRON) 4 MG tablet Take 2 tablets (8 mg total) by mouth See admin instructions. Take '8mg'$  daily for 2 days after each chemotherapy-cisplatin treatments. 10/10/19   Earlie Server, MD  dexamethasone (DECADRON) 4 MG tablet Take 2 tablets (8 mg total) by mouth daily. Start the day after carboplatin chemotherapy for 3 days. Patient not taking: No sig reported 05/20/20   Earlie Server, MD  loperamide (IMODIUM) 2 MG capsule Take 1 capsule (2 mg total) by mouth See admin instructions. Take 2 capsules with onset  of diarrhea. Then take 1 capsule after every loose bowel movements. Maximum 8 capsules per 24 hours. Patient not taking: No sig reported 06/17/20   Earlie Server, MD  nystatin (MYCOSTATIN) 100000 UNIT/ML suspension Use as directed 5 mLs (500,000 Units total) in the mouth or throat 4 (four) times daily. Patient not taking: No sig reported 10/25/19   Earlie Server, MD  OLANZapine  (ZYPREXA) 10 MG tablet Take 1 tablet (10 mg total) by mouth at bedtime. 07/08/20   Earlie Server, MD  omeprazole (PRILOSEC) 20 MG capsule Take 1 capsule (20 mg total) by mouth daily. Patient not taking: No sig reported 12/11/19   Earlie Server, MD  prochlorperazine (COMPAZINE) 10 MG tablet Take 1 tablet (10 mg total) by mouth every 6 (six) hours as needed (Nausea or vomiting). Patient not taking: Reported on 08/28/2020 06/03/20   Earlie Server, MD  promethazine (PHENERGAN) 25 MG suppository Place 1 suppository (25 mg total) rectally every 6 (six) hours as needed for nausea, vomiting or refractory nausea / vomiting. Patient not taking: Reported on 08/28/2020 06/20/20   Earlie Server, MD  senna (SENOKOT) 8.6 MG TABS tablet Take 2 tablets (17.2 mg total) by mouth daily. Patient not taking: No sig reported 10/24/19   Earlie Server, MD  sulfamethoxazole-trimethoprim (BACTRIM DS) 800-160 MG tablet Take 1 tablet by mouth 2 (two) times daily. Patient not taking: No sig reported 07/07/20   Lloyd Huger, MD  traMADol (ULTRAM) 50 MG tablet Take 1 tablet (50 mg total) by mouth every 12 (twelve) hours as needed. 06/20/20   Earlie Server, MD    Allergies Patient has no known allergies.  Family History  Problem Relation Age of Onset   Blindness Mother    Glaucoma Mother    Diabetes Father    Diabetes Brother     Social History Social History   Tobacco Use   Smoking status: Never   Smokeless tobacco: Never  Vaping Use   Vaping Use: Never used  Substance Use Topics   Alcohol use: Never   Drug use: Never    Review of Systems  Review of Systems  Constitutional:  Positive for chills and malaise/fatigue. Negative for fever.  HENT:  Negative for sore throat.   Eyes:  Negative for pain.  Respiratory:  Negative for cough and stridor.   Cardiovascular:  Negative for chest pain.  Gastrointestinal:  Positive for diarrhea and nausea.  Genitourinary:  Negative for dysuria.  Musculoskeletal:  Positive for myalgias.  Skin:  Negative  for rash.  Neurological:  Positive for weakness and headaches. Negative for seizures and loss of consciousness.  Psychiatric/Behavioral:  Negative for suicidal ideas.   All other systems reviewed and are negative.    ____________________________________________   PHYSICAL EXAM:  VITAL SIGNS: ED Triage Vitals  Enc Vitals Group     BP 12/09/20 1622 (!) 143/72     Pulse Rate 12/09/20 1622 78     Resp 12/09/20 1622 18     Temp 12/09/20 1622 98.5 F (36.9 C)     Temp Source 12/09/20 1622 Oral     SpO2 12/09/20 1622 98 %     Weight 12/09/20 1631 189 lb (85.7 kg)     Height 12/09/20 1631 '5\' 1"'$  (1.549 m)     Head Circumference --      Peak Flow --      Pain Score 12/09/20 1631 8     Pain Loc --      Pain Edu? --  Excl. in [place]? --    Vitals:   12/09/20 1622 12/09/20 2215  BP: (!) 143/72 (!) 147/70  Pulse: 78 71  Resp: 18 18  Temp: 98.5 F (36.9 C)   SpO2: 98% 96%   Physical Exam Vitals and nursing note reviewed.  Constitutional:      General: She is not in acute distress.    Appearance: She is well-developed.  HENT:     Head: Normocephalic and atraumatic.     Right Ear: External ear normal.     Left Ear: External ear normal.     Nose: Nose normal.     Mouth/Throat:     Mouth: Mucous membranes are moist.  Eyes:     Conjunctiva/sclera: Conjunctivae normal.  Cardiovascular:     Rate and Rhythm: Normal rate and regular rhythm.     Heart sounds: No murmur heard. Pulmonary:     Effort: Pulmonary effort is normal. No respiratory distress.     Breath sounds: Normal breath sounds.  Abdominal:     Palpations: Abdomen is soft.     Tenderness: There is no abdominal tenderness.  Musculoskeletal:     Cervical back: Neck supple.  Skin:    General: Skin is warm and dry.     Capillary Refill: Capillary refill takes less than 2 seconds.  Neurological:     General: No focal deficit present.     Mental Status: She is alert and oriented to person, place, and time.      Cranial Nerves: No cranial nerve deficit.  Psychiatric:        Mood and Affect: Mood normal.     ____________________________________________   LABS (all labs ordered are listed, but only abnormal results are displayed)  Labs Reviewed  COMPREHENSIVE METABOLIC PANEL - Abnormal; Notable for the following components:      Result Value   BUN 22 (*)    All other components within normal limits  URINALYSIS, COMPLETE (UACMP) WITH MICROSCOPIC - Abnormal; Notable for the following components:   Color, Urine STRAW (*)    APPearance CLEAR (*)    Leukocytes,Ua TRACE (*)    All other components within normal limits  RESP PANEL BY RT-PCR (FLU A&B, COVID) [id number]  GASTROINTESTINAL PANEL BY PCR, STOOL (REPLACES STOOL CULTURE)  C DIFFICILE QUICK SCREEN W PCR REFLEX    LIPASE, BLOOD  CBC   ____________________________________________  EKG  ____________________________________________  RADIOLOGY  ED MD interpretation:   Official radiology report(s): No results found.  ____________________________________________   PROCEDURES  Procedure(s) performed (including Critical Care):  Procedures   ____________________________________________   INITIAL IMPRESSION / ASSESSMENT AND PLAN / ED COURSE     Patient presents with above-stated exam for slightly more than 1 week of headache associate with nausea, current abdominal pain, diarrhea, decreased appetite and chills.  The symptoms all began after she returned from [place].  On arrival she is hypertensive with otherwise stable vital signs on room air.  Her abdomen is soft nontender throughout and she has no CVA tenderness.  Her lungs are clear bilaterally.  She has less than 2-second cap and mucous membranes are moist is a nonfocal exam and does not appear meningitic.  CMP shows no significant electrolyte or metabolic derangements.  No evidence of hepatitis or cholestasis.  Lipase not consistent with pancreatitis.  CBC without leukocytosis  or acute anemia.  UA not suggestive of urinary tract infection  Suspect likely acute viral syndrome with her flu or COVID or acute travel or gastrointestinal illness  possibly related to bacterial gastroenteritis.  Patient does not appear septic I have very low suspicion for diverticulitis, cholecystitis or significant metabolic derangement.  She has been unable to tolerate some p.o. but feels she has had decreased appetite.  No history exam features to suggest recent trauma and I have a low suspicion for toxic ingestion.  Will obtain influenza and COVID PCR.  Attempted to obtain GI pathogen's panel although patient is unable to provide stool sample after approximately 9 hours in the emergency room.  I will treat symptomatically with Zofran and Bentyl and Tylenol in emergency room.  Advised patient that I will prescribe prescription for Zofran and Bentyl and she may take Tylenol for headache but she must ensure she is staying adequately hydrated over next couple days with electrolyte Containing beverages even if she has had little appetite for solid food.  We will empirically treat for possible trial associated gastroenteritis with a short course of azithromycin.  Advised patient to rest follow-up with her PCP in the next 1 to 3 days.  She is amenable with plan.  She will return immediately to emergency room if she is unable to keep fluids down, develops fever, worsening pain or any other acute sick symptoms.  Discharged stable condition.  Strict return precautions advised and discussed.         ____________________________________________   FINAL CLINICAL IMPRESSION(S) / ED DIAGNOSES  Final diagnoses:  Enteritis    Medications  dicyclomine (BENTYL) capsule 10 mg (10 mg Oral Given 12/10/20 0016)  acetaminophen (TYLENOL) tablet 1,000 mg (1,000 mg Oral Given 12/10/20 0017)  ondansetron (ZOFRAN-ODT) disintegrating tablet 4 mg (4 mg Oral Given 12/10/20 0017)     ED Discharge Orders           Ordered    ondansetron (ZOFRAN) 4 MG tablet  Every 8 hours PRN        12/10/20 0018    dicyclomine (BENTYL) 10 MG capsule  3 times daily before meals & bedtime        12/10/20 0018    azithromycin (ZITHROMAX) 500 MG tablet  Daily        12/10/20 0018             Note:  This document was prepared using Dragon voice recognition software and may include unintentional dictation errors.    Lucrezia Starch, MD 12/10/20 [phone]

## 2020-12-10 LAB — RESP PANEL BY RT-PCR (FLU A&B, COVID) ARPGX2
Influenza A by PCR: NEGATIVE
Influenza B by PCR: NEGATIVE
SARS Coronavirus 2 by RT PCR: POSITIVE — AB

## 2020-12-10 MED ORDER — DICYCLOMINE HCL 10 MG PO CAPS: 10.0000 mg | ORAL_CAPSULE | Freq: Three times a day (TID) | ORAL | 0 refills | Status: DC

## 2020-12-10 MED ORDER — DICYCLOMINE HCL 10 MG PO CAPS
10.0000 mg | ORAL_CAPSULE | Freq: Once | ORAL | Status: AC
  Administered 2020-12-10: 10 mg via ORAL
  Filled 2020-12-10: qty 1

## 2020-12-10 MED ORDER — ONDANSETRON 4 MG PO TBDP
4.0000 mg | ORAL_TABLET | Freq: Once | ORAL | Status: AC
  Administered 2020-12-10: 4 mg via ORAL
  Filled 2020-12-10: qty 1

## 2020-12-10 MED ORDER — AZITHROMYCIN 500 MG PO TABS: 500.0000 mg | ORAL_TABLET | Freq: Every day | ORAL | 0 refills | Status: AC

## 2020-12-10 MED ORDER — ONDANSETRON HCL 4 MG PO TABS: 4.0000 mg | ORAL_TABLET | Freq: Three times a day (TID) | ORAL | 0 refills | Status: DC | PRN

## 2020-12-10 MED ORDER — ACETAMINOPHEN 500 MG PO TABS
1000.0000 mg | ORAL_TABLET | Freq: Once | ORAL | Status: AC
  Administered 2020-12-10: 1000 mg via ORAL
  Filled 2020-12-10: qty 2

## 2021-01-15 ENCOUNTER — Ambulatory Visit
Admission: RE | Admit: 2021-01-15 | Discharge: 2021-01-15 | Disposition: A | Discharge status: Home / Self Care | Payer: Self-pay | Source: Ambulatory Visit | Attending: Oncology | Admitting: Oncology

## 2021-01-15 ENCOUNTER — Other Ambulatory Visit: Payer: Self-pay

## 2021-01-15 DIAGNOSIS — C538 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 uteri: Secondary | ICD-10-CM | POA: Insufficient documentation

## 2021-01-15 LAB — POCT I-STAT CREATININE: Creatinine, Ser: 0.6 mg/dL (ref 0.44–1.00)

## 2021-01-15 MED ORDER — IOHEXOL 350 MG/ML SOLN
100.0000 mL | Freq: Once | INTRAVENOUS | Status: AC | PRN
  Administered 2021-01-15: 100 mL via INTRAVENOUS

## 2021-01-17 ENCOUNTER — Inpatient Hospital Stay: Payer: Self-pay | Attending: Oncology | Admitting: Oncology

## 2021-01-17 ENCOUNTER — Encounter: Payer: Self-pay | Admitting: Oncology

## 2021-01-17 VITALS — BP 122/71 | HR 75 | Temp 98.9°F | Wt 186.7 lb

## 2021-01-17 DIAGNOSIS — R918 Other nonspecific abnormal finding of lung field: Secondary | ICD-10-CM | POA: Insufficient documentation

## 2021-01-17 DIAGNOSIS — C538 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 uteri: Secondary | ICD-10-CM | POA: Insufficient documentation

## 2021-01-17 DIAGNOSIS — C778 Secondary and unspecified malignant neoplasm of lymph nodes of multiple regions: Secondary | ICD-10-CM | POA: Insufficient documentation

## 2021-01-17 DIAGNOSIS — Z79899 Other long term (current) drug therapy: Secondary | ICD-10-CM | POA: Insufficient documentation

## 2021-01-17 DIAGNOSIS — Z7189 Other specified counseling: Secondary | ICD-10-CM

## 2021-01-17 NOTE — Progress Notes (Signed)
Hematology/Oncology follow up note [facility] [phone] [phone]   Patient Care Team: Pcp, No as PCP - General Jill Jacks, RN as Oncology Nurse Navigator  REFERRING PROVIDER: No ref. provider found  CHIEF COMPLAINTS/REASON FOR VISIT:  Follow up for cervical cancer, treatment related symptoms.  HISTORY OF PRESENTING ILLNESS:   Jill Rodriguez is a  60 y.o.  female with PMH listed below was seen in consultation at the request of  No ref. provider found  for evaluation of cervical cancer.   Patient is G3, P3 Spanish-speaking postmenopausal female who initially presented with vaginal bleeding.  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  No intervention seen.  08/23/2019, pelvis ultrasound showed endometrium measures 8 mm.  Echogenic endometrial microcalcification seen throughout endometrium.  Hypoechoic area in the fundal aspect of the endometrium measures 0.8 x 1.1 x 2.3 cm Survey of the adnexa showed no adnexal masses.  Patient has history of prominent right neck pelvis and CT angio neck has been ordered. Patient was seen by Dr. Fransisca Rodriguez, clinically the tumor is 3 cm and replaced whole cervix. 09/06/2019 PET scan showed hypermetabolic mass involving the cervix and the lower uterine segment consistent with known cervical cancer. Multiple small hypermetabolic retroperitoneal and pelvic lymph node bilaterally consistent with nodal metastasis. She also has small hypermetabolic lymph nodes in the left superior mediastinum and left supraclavicular regions are also suspicious for nodal metastasis.  Hepatic steatosis no cholelithiasis noted.  At least Stage IIIc Cervix cancer, with small hypermetabolic lymph nodes in the left superior  mediastinum and left supraclavicular region which could put her to stage IV disease. hypermetabolic activity initially improved after concurrent chemoradiation.  # Cancer Treatment:  July 2021 Finished concurrent chemoradiation with weekly Cisplatin.   # 02/06/2020 repeat PET scan showed recurrent hypermetabolic left mediastinum lymph node.  No additional sites of recurrent hypermetabolic metastasis.  Residual mild hypermetabolism within the uterine cervix, slightly decreased.  2 small lung nodules with activity below PET resolution. Nonspecific persistent mild hypermetabolic 7 in the lower thoracic esophageal without discrete mass.  Diffuse hepatic steatosis, cholelithiasis atherosclerosis # She underwent EUS for further evaluation of esophageal hypermetabolic activity as well attempt to get biopsy, not feasible  #PET scan done at [facility], 04/03/2020, PET scan showed subcentimeter hypermetabolic cervical, mediastinal, retrocrural lymph nodes, concerning for lymphatic metastasis.  Multiple tiny indeterminate pulmonary nodule below the resolution of PET.  No abnormal FDG uptake within the cervix at the site of known primary malignancy. Patient has also developed a more supraclavicular fullness.  Patient was seen by Dr. Fransisca Rodriguez on 05/01/2020.  Decision was made to proceed with biopsy of the left supraclavicular lymph node.  Left supraclavicular lymph node biopsy pathology was reviewed and discussed. This is consistent with stage IV, metastatic cervix cancer with cervical, thoracic, retrocrural lymph node metastasis.  05/30/2020 - 07/10/2020 2 cycles of carboplatin, Taxol and bevacizumab.  Patient adamantly declines further treatment 08/08/2020, PET scan showed resolution of previous hypermetabolic activity.  No findings suspicious for metastatic cancer. Discussed with patient and daughter that she has NED, cancer may recur in the future. I recommend additional 2 cycles of carboplatin and Taxol/bevacizumab to  further consolidate and she may be switched to maintenance and patient declined.   INTERVAL HISTORY Jill Rodriguez is a 60 y.o. female who  has above history reviewed by me today presents for stage IV cervical cancer. Patient had CTscan done and presents to discuss results and future management plan.  She was accompanied by her daughter. She denies SOB, cough, unintentional weight loss.    Review of Systems  Constitutional:  Positive for fatigue. Negative for appetite change, chills, fever and unexpected weight change.  HENT:   Negative for hearing loss and voice change.   Eyes:  Negative for eye problems.  Respiratory:  Negative for chest tightness and cough.   Cardiovascular:  Negative for chest pain.  Gastrointestinal:  Negative for abdominal distention, abdominal pain, blood in stool, nausea and vomiting.  Endocrine: Negative for hot flashes.  Genitourinary:  Negative for difficulty urinating, dysuria and frequency.   Musculoskeletal:  Negative for arthralgias.  Skin:  Negative for itching and rash.  Neurological:  Positive for numbness. Negative for extremity weakness.  Hematological:  Negative for adenopathy.  Psychiatric/Behavioral:  Negative for confusion.    MEDICAL HISTORY:  Past Medical History:  Diagnosis Date   Cancer ([place])    cervical CA, that spread to lymph nodes; last chemo was 12/08/2019; radiation 01/2020   COVID-19 10/2018   Nausea with vomiting 10/31/2019   No pertinent past medical history     SURGICAL HISTORY: Past Surgical History:  Procedure Laterality Date   ESOPHAGOGASTRODUODENOSCOPY (EGD) WITH PROPOFOL N/A 03/28/2020   Procedure: ESOPHAGOGASTRODUODENOSCOPY (EGD) WITH PROPOFOL;  Surgeon: Milus Banister, MD;  Location: [facility] ENDOSCOPY;  Service: Endoscopy;  Laterality: N/A;   EUS N/A 03/28/2020   Procedure: UPPER ENDOSCOPIC ULTRASOUND (EUS) RADIAL;  Surgeon: Milus Banister, MD;  Location: [facility] ENDOSCOPY;  Service: Endoscopy;  Laterality: N/A;   no  surgical history      SOCIAL HISTORY: Social History   Socioeconomic History   Marital status: Married    Spouse name: Jill Rodriguez    Number of children: 3   Years of education: Not on file   Highest education level: Not on file  Occupational History   Not on file  Tobacco Use   Smoking status: Never   Smokeless tobacco: Never  Vaping Use   Vaping Use: Never used  Substance and Sexual Activity   Alcohol use: Never   Drug use: Never   Sexual activity: Yes    Birth control/protection: None  Other Topics Concern   Not on file  Social History Narrative   Lives at home with spouse ; daughter [name] comes & stays when needed   Social Determinants of Health   Financial Resource Strain: Not on file  Food Insecurity: Not on file  Transportation Needs: Not on file  Physical Activity: Not on file  Stress: Not on file  Social Connections: Not on file  Intimate Partner Violence: Not on file    FAMILY HISTORY: Family History  Problem Relation Age of Onset   Blindness Mother    Glaucoma Mother    Diabetes Father    Diabetes Brother     ALLERGIES:  has No Known Allergies.  MEDICATIONS:  Current Outpatient Medications  Medication Sig Dispense Refill   dexamethasone (DECADRON) 4 MG tablet Take 2 tablets (8 mg total) by mouth See admin instructions. Take 8mg  daily for 2 days after each chemotherapy-cisplatin treatments. (Patient not taking: Reported on 01/17/2021) 24 tablet 0   dexamethasone (DECADRON) 4 MG tablet Take 2 tablets (8 mg total) by mouth daily. Start the day after carboplatin chemotherapy for 3 days. (Patient not taking: No sig reported) 30 tablet 1  dicyclomine (BENTYL) 10 MG capsule Take 1 capsule (10 mg total) by mouth 4 (four) times daily -  before meals and at bedtime for 3 days. (Patient not taking: Reported on 01/17/2021) 12 capsule 0   loperamide (IMODIUM) 2 MG capsule Take 1 capsule (2 mg total) by mouth See admin instructions. Take 2 capsules with onset of  diarrhea. Then take 1 capsule after every loose bowel movements. Maximum 8 capsules per 24 hours. (Patient not taking: No sig reported) 60 capsule 0   nystatin (MYCOSTATIN) 100000 UNIT/ML suspension Use as directed 5 mLs (500,000 Units total) in the mouth or throat 4 (four) times daily. (Patient not taking: No sig reported) 120 mL 0   OLANZapine (ZYPREXA) 10 MG tablet Take 1 tablet (10 mg total) by mouth at bedtime. (Patient not taking: Reported on 01/17/2021) 30 tablet 0   omeprazole (PRILOSEC) 20 MG capsule Take 1 capsule (20 mg total) by mouth daily. (Patient not taking: No sig reported) 30 capsule 1   ondansetron (ZOFRAN) 4 MG tablet Take 1 tablet (4 mg total) by mouth every 8 (eight) hours as needed for up to 10 doses for nausea or vomiting. (Patient not taking: Reported on 01/17/2021) 10 tablet 0   prochlorperazine (COMPAZINE) 10 MG tablet Take 1 tablet (10 mg total) by mouth every 6 (six) hours as needed (Nausea or vomiting). (Patient not taking: No sig reported) 30 tablet 1   promethazine (PHENERGAN) 25 MG suppository Place 1 suppository (25 mg total) rectally every 6 (six) hours as needed for nausea, vomiting or refractory nausea / vomiting. (Patient not taking: No sig reported) 120 each 3   senna (SENOKOT) 8.6 MG TABS tablet Take 2 tablets (17.2 mg total) by mouth daily. (Patient not taking: No sig reported) 120 tablet 0   sulfamethoxazole-trimethoprim (BACTRIM DS) 800-160 MG tablet Take 1 tablet by mouth 2 (two) times daily. (Patient not taking: No sig reported) 10 tablet 0   traMADol (ULTRAM) 50 MG tablet Take 1 tablet (50 mg total) by mouth every 12 (twelve) hours as needed. (Patient not taking: Reported on 01/17/2021) 60 tablet 0   No current facility-administered medications for this visit.     PHYSICAL EXAMINATION: ECOG PERFORMANCE STATUS: 1 - Symptomatic but completely ambulatory Vitals:   01/17/21 0955  BP: 122/71  Pulse: 75  Temp: 98.9 F (37.2 C)   Filed Weights   01/17/21  0955  Weight: 186 lb 11.2 oz (84.7 kg)    Physical Exam Constitutional:      General: She is not in acute distress. HENT:     Head: Normocephalic and atraumatic.  Eyes:     General: No scleral icterus. Cardiovascular:     Rate and Rhythm: Normal rate and regular rhythm.     Heart sounds: Normal heart sounds.  Pulmonary:     Effort: Pulmonary effort is normal. No respiratory distress.     Breath sounds: No wheezing.  Abdominal:     General: Bowel sounds are normal. There is no distension.     Palpations: Abdomen is soft.  Musculoskeletal:        General: No deformity. Normal range of motion.     Cervical back: Normal range of motion and neck supple.  Lymphadenopathy:     Cervical: Cervical adenopathy present.  Skin:    General: Skin is warm and dry.     Findings: No erythema or rash.  Neurological:     Mental Status: She is alert and oriented to person, place, and time.  Mental status is at baseline.     Cranial Nerves: No cranial nerve deficit.     Coordination: Coordination normal.  Psychiatric:        Mood and Affect: Mood normal.    LABORATORY DATA:  I have reviewed the data as listed Lab Results  Component Value Date   WBC 5.7 12/09/2020   HGB 13.8 12/09/2020   HCT 41.7 12/09/2020   MCV 96.1 12/09/2020   PLT 245 12/09/2020   Recent Labs    01/29/20 1738 05/23/20 1504 06/20/20 0834 07/31/20 0816 12/09/20 1635 01/15/21 1127  NA 138   < > 139 138 139  --   K 3.6   < > 3.8 3.5 4.2  --   CL 103   < > 108 108 104  --   CO2 25   < > 21* 21* 28  --   GLUCOSE 101*   < > 179* 183* 86  --   BUN 10   < > 14 17 22*  --   CREATININE 0.57   < > 0.65 0.72 0.73 0.60  CALCIUM 9.4   < > 8.6* 9.0 9.2  --   GFRNONAA >60   < > >60 >60 >60  --   GFRAA >60  --   --   --   --   --   PROT 7.9   < > 7.3 6.8 8.0  --   ALBUMIN 3.9   < > 3.8 3.5 4.4  --   AST 38   < > 32 32 32  --   ALT 27   < > 26 21 31   --   ALKPHOS 73   < > 105 95 91  --   BILITOT 0.6   < > 0.4 0.5 0.7   --    < > = values in this interval not displayed.    Iron/TIBC/Ferritin/ %Sat No results found for: IRON, TIBC, FERRITIN, IRONPCTSAT    RADIOGRAPHIC STUDIES: I have personally reviewed the radiological images as listed and agreed with the findings in the report. CT CHEST ABDOMEN PELVIS W CONTRAST  Result Date: 01/16/2021 CLINICAL DATA:  Metastatic squamous cell cervical cancer restaging, assess treatment response, radiation and chemotherapy EXAM: CT CHEST, ABDOMEN, AND PELVIS WITH CONTRAST TECHNIQUE: Multidetector CT imaging of the chest, abdomen and pelvis was performed following the standard protocol during bolus administration of intravenous contrast. CONTRAST:  117mL OMNIPAQUE IOHEXOL 350 MG/ML SOLN, additional oral enteric contrast COMPARISON:  PET-CT, 08/08/2020 FINDINGS: CT CHEST FINDINGS Cardiovascular: Minimal, scattered aortic atherosclerosis. Mild cardiomegaly. No pericardial effusion. Mediastinum/Nodes: No enlarged mediastinal, hilar, or axillary lymph nodes. Thyroid gland, trachea, and esophagus demonstrate no significant findings. Lungs/Pleura: There are multiple new small pulmonary nodules scattered throughout the lungs, for example a 4 mm nodule of the anterior left upper lobe (series 4, image 29), a 3 mm nodule of the superior segment left lower lobe (series 4, image 44), and a 3 mm nodule of the posterior right upper lobe (series 4, image 30). Bandlike scarring of the bilateral lung bases. No pleural effusion or pneumothorax. Musculoskeletal: No chest wall mass or suspicious bone lesions identified. CT ABDOMEN PELVIS FINDINGS Hepatobiliary: No solid liver abnormality is seen. Hepatic steatosis. Gallstones in the gallbladder. No gallbladder wall thickening, or biliary dilatation. Pancreas: Unremarkable. No pancreatic ductal dilatation or surrounding inflammatory changes. Spleen: Normal in size without significant abnormality. Adrenals/Urinary Tract: Adrenal glands are unremarkable.  Kidneys are normal, without renal calculi, solid lesion, or hydronephrosis. Bladder is  unremarkable. Stomach/Bowel: Stomach is within normal limits. Appendix appears normal. No evidence of bowel wall thickening, distention, or inflammatory changes. Vascular/Lymphatic: No significant vascular findings are present. No enlarged abdominal or pelvic lymph nodes. Reproductive: No mass or other abnormality. Other: No abdominal wall hernia or abnormality. Trace nonspecific free fluid in the low pelvis (series 3, image 102). Musculoskeletal: No acute or significant osseous findings. IMPRESSION: 1. There are multiple new small pulmonary nodules scattered throughout the lungs, measuring 4 mm and smaller and highly suspicious for pulmonary metastatic disease. 2. No evidence of lymphadenopathy or metastatic disease in the abdomen or pelvis. 3. Known cervical mass is not discretely appreciated. 4. Trace nonspecific free fluid in the low pelvis. 5. Hepatic steatosis. 6. Cholelithiasis. Aortic Atherosclerosis (ICD10-I70.0). Electronically Signed   By: Eddie Candle M.D.   On: 01/16/2021 11:47       ASSESSMENT & PLAN:  1. 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 ([place])   2. Goals of care, counseling/discussion   Cancer Staging Malignant neoplasm of cervix [facility]) Staging form: Cervix Uteri, AJCC Version 9 - Clinical stage from 09/11/2019: FIGO Stage IIIC2r, calculated as Stage IIIC2 (cT1b2, cN2, cM0) - Signed by Earlie Server, MD on 09/11/2019 - Pathologic stage from 05/20/2020: FIGO Stage IVB (pTX, pNX, pM1) - Signed by Earlie Server, MD on 05/20/2020  #Metastatic cervix cancer, currently on first-line chemotherapy treatment with carboplatin, Taxol, bevacizumab. interpreter service for Spanish was utilized for this encounter. 01/15/2021 CT chest abdomen pelvis w contrast multiple new small pulmonary nodules scattered throughout the lungs, measuring 4 mm and smaller and highly suspicious for pulmonary metastatic disease. No  evidence of lymphadenopathy or metastatic disease in the abdomen or pelvis. Known cervical mass is not discretely appreciated.  Likely recurrence disease. Lung nodules are too small to be biopsied or beyond PET activity. Recommend resume chemotherapy, carboplatin/Taxol/ Bevacizumab.  Patient and her daughter agrees.   Will arrange her to start in the next 1- 2 weeks.    Earlie Server, MD, PhD Hematology Oncology [facility] Pager- [phone] 01/17/2021

## 2021-01-17 NOTE — Progress Notes (Signed)
Pt here for follow up. No new concerns voiced.   

## 2021-01-19 ENCOUNTER — Encounter: Payer: Self-pay | Admitting: Oncology

## 2021-01-29 ENCOUNTER — Telehealth: Payer: Self-pay

## 2021-01-31 ENCOUNTER — Encounter: Payer: Self-pay | Admitting: Oncology

## 2021-01-31 ENCOUNTER — Inpatient Hospital Stay: Payer: Self-pay | Admitting: Oncology

## 2021-01-31 ENCOUNTER — Inpatient Hospital Stay: Payer: Self-pay | Attending: Oncology

## 2021-01-31 ENCOUNTER — Inpatient Hospital Stay: Payer: Self-pay

## 2021-01-31 ENCOUNTER — Telehealth: Payer: Self-pay | Admitting: Oncology

## 2021-01-31 NOTE — Telephone Encounter (Signed)
Called patient daughter to see if they would like to reschedule her 01/31/21 appt. No answer, left VM to contact the office for rescheduling.

## 2021-03-05 ENCOUNTER — Inpatient Hospital Stay: Payer: Self-pay | Attending: Obstetrics and Gynecology

## 2021-03-12 ENCOUNTER — Telehealth: Payer: Self-pay

## 2021-03-12 NOTE — Telephone Encounter (Signed)
Jill Rodriguez did not show for her 03/05/21 gyn oncology follow up. Message sent to scheduling to contact for rescheduling.

## 2021-03-14 ENCOUNTER — Telehealth: Payer: Self-pay

## 2021-03-14 NOTE — Telephone Encounter (Signed)
Scheduling has been unsuccessful at reaching to reschedule missed gyn oncology appointment. Letter sent to MyChart and to home address.

## 2021-04-23 ENCOUNTER — Encounter: Payer: Self-pay | Admitting: Oncology

## 2021-05-09 ENCOUNTER — Encounter: Payer: Self-pay | Admitting: Oncology

## 2021-05-09 NOTE — Telephone Encounter (Signed)
error 

## 2021-05-15 ENCOUNTER — Telehealth: Payer: Self-pay | Admitting: Oncology

## 2021-05-15 ENCOUNTER — Other Ambulatory Visit: Payer: Self-pay

## 2021-05-15 DIAGNOSIS — C538 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 uteri: Secondary | ICD-10-CM

## 2021-05-15 NOTE — Telephone Encounter (Addendum)
Per Dr. Tasia Catchings, pt will need repeat CT and then will see her a few days after (MD only) to review results and plan. Please schedule patient and notify daughter, [name], of appts   CT ch/a/p next available MD a few days after labs   Will need in person interpreter for appt.

## 2021-05-15 NOTE — Telephone Encounter (Signed)
How would you like to proceed with scheduling. Pt had CT scan back in September and missed Gyn onc and new tx

## 2021-05-15 NOTE — Telephone Encounter (Signed)
Daughter called in to make an appt for her mother. Call back at [phone]

## 2021-05-16 NOTE — Telephone Encounter (Signed)
Attempted to contact Daisy. No answer, unable to to leave VM. Sending AVS and mychart message. Will try to call again.

## 2021-05-19 ENCOUNTER — Telehealth: Payer: Self-pay | Admitting: Oncology

## 2021-05-19 NOTE — Telephone Encounter (Signed)
Daughter called to set up appt for mother. Call back at [phone]

## 2021-06-03 ENCOUNTER — Ambulatory Visit
Admission: RE | Admit: 2021-06-03 | Discharge: 2021-06-03 | Disposition: A | Discharge status: Home / Self Care | Payer: BLUE CROSS/BLUE SHIELD | Source: Ambulatory Visit | Attending: Oncology | Admitting: Oncology

## 2021-06-03 ENCOUNTER — Other Ambulatory Visit: Payer: Self-pay

## 2021-06-03 DIAGNOSIS — C538 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 uteri: Secondary | ICD-10-CM | POA: Diagnosis not present

## 2021-06-03 LAB — POCT I-STAT CREATININE: Creatinine, Ser: 0.7 mg/dL (ref 0.44–1.00)

## 2021-06-03 MED ORDER — IOHEXOL 300 MG/ML  SOLN
80.0000 mL | Freq: Once | INTRAMUSCULAR | Status: AC | PRN
  Administered 2021-06-03: 80 mL via INTRAVENOUS

## 2021-06-05 ENCOUNTER — Encounter: Payer: Self-pay | Admitting: Oncology

## 2021-06-05 ENCOUNTER — Other Ambulatory Visit: Payer: Self-pay

## 2021-06-05 ENCOUNTER — Inpatient Hospital Stay: Payer: BLUE CROSS/BLUE SHIELD | Attending: Oncology | Admitting: Oncology

## 2021-06-05 VITALS — BP 147/77 | HR 113 | Temp 97.1°F | Resp 18 | Wt 185.0 lb

## 2021-06-05 DIAGNOSIS — R918 Other nonspecific abnormal finding of lung field: Secondary | ICD-10-CM | POA: Diagnosis not present

## 2021-06-05 DIAGNOSIS — R599 Enlarged lymph nodes, unspecified: Secondary | ICD-10-CM | POA: Diagnosis not present

## 2021-06-05 DIAGNOSIS — C538 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 uteri: Secondary | ICD-10-CM | POA: Diagnosis not present

## 2021-06-05 DIAGNOSIS — Z7189 Other specified counseling: Secondary | ICD-10-CM | POA: Diagnosis not present

## 2021-06-05 NOTE — Progress Notes (Signed)
Hematology/Oncology Progress note [phone] [phone]      Patient Care Team: Pcp, No as PCP - General Clent Jacks, RN as Oncology Nurse Navigator Earlie Server, MD as Consulting Physician (Hematology and Oncology) Mellody Drown, MD as Consulting Physician (Gynecologic Oncology) Shirline Frees, MD as Consulting Physician (Radiation Oncology) Noreene Filbert, MD as Consulting Physician (Radiation Oncology)  REFERRING PROVIDER: No ref. provider found  CHIEF COMPLAINTS/REASON FOR VISIT:  Follow up for cervical cancer, treatment related symptoms.  HISTORY OF PRESENTING ILLNESS:   Jill Rodriguez is a  61 y.o.  female with PMH listed below was seen in consultation at the request of  No ref. provider found  for evaluation of cervical cancer.   Patient is G3, P3 Spanish-speaking postmenopausal female who initially presented with vaginal bleeding.  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  No intervention seen.  08/23/2019, pelvis ultrasound showed endometrium measures 8 mm.  Echogenic endometrial microcalcification seen throughout endometrium.  Hypoechoic area in the fundal aspect of the endometrium measures 0.8 x 1.1 x 2.3 cm Survey of the adnexa showed no adnexal masses.  Patient has history of prominent right neck pelvis and CT angio neck has been ordered. Patient was seen by Dr. Fransisca Connors, clinically the tumor is 3 cm and replaced whole cervix. 09/06/2019 PET scan showed hypermetabolic mass involving the cervix and the lower uterine segment consistent with known cervical cancer. Multiple small hypermetabolic retroperitoneal and pelvic lymph node bilaterally consistent with nodal metastasis. She also has small hypermetabolic lymph nodes in the left  superior mediastinum and left supraclavicular regions are also suspicious for nodal metastasis.  Hepatic steatosis no cholelithiasis noted.  At least Stage IIIc Cervix cancer, with small hypermetabolic lymph nodes in the left superior mediastinum and left supraclavicular region which could put her to stage IV disease. hypermetabolic activity initially improved after concurrent chemoradiation.  # Cancer Treatment:  July 2021 Finished concurrent chemoradiation with weekly Cisplatin.   # 02/06/2020 repeat PET scan showed recurrent hypermetabolic left mediastinum lymph node.  No additional sites of recurrent hypermetabolic metastasis.  Residual mild hypermetabolism within the uterine cervix, slightly decreased.  2 small lung nodules with activity below PET resolution. Nonspecific persistent mild hypermetabolic 7 in the lower thoracic esophageal without discrete mass.  Diffuse hepatic steatosis, cholelithiasis atherosclerosis # She underwent EUS for further evaluation of esophageal hypermetabolic activity as well attempt to get biopsy, not feasible  #PET scan done at [facility], 04/03/2020, PET scan showed subcentimeter hypermetabolic cervical, mediastinal, retrocrural lymph nodes, concerning for lymphatic metastasis.  Multiple tiny indeterminate pulmonary nodule below the resolution of PET.  No abnormal FDG uptake within the cervix at the site of known primary malignancy. Patient has also developed a more supraclavicular fullness.  Patient was seen by Dr. Fransisca Connors on 05/01/2020.  Decision was made to proceed with biopsy of the left supraclavicular lymph node.  05/09/2020 left supraclavicular lymph node biopsy pathology was reviewed and discussed. This is consistent with stage IV, metastatic cervix cancer with cervical, thoracic, retrocrural lymph node metastasis.  05/30/2020 - 07/10/2020 2 cycles of carboplatin, Taxol and bevacizumab.  Patient adamantly declines further treatment 08/08/2020, PET scan showed  resolution of previous hypermetabolic activity.  No findings suspicious for metastatic cancer. Discussed with patient and daughter that she has NED, cancer may recur in the future. I recommend  additional 2 cycles of carboplatin and Taxol/bevacizumab to further consolidate and she may be switched to maintenance and patient declined.   01/15/2021 CT chest abdomen pelvis w contrast multiple new small pulmonary nodules scattered throughout the lungs, measuring 4 mm and smaller and highly suspicious for pulmonary metastatic disease. No evidence of lymphadenopathy or metastatic disease in the abdomen or pelvis. Known cervical mass is not discretely appreciated. Patient was recommended to resume chemotherapy.  Patient initially agreed and did not show up chemotherapy appointments.  Multiple attempts to reach patient and her family member were non-successful   INTERVAL HISTORY Jill Rodriguez is a 61 y.o. female who has above history reviewed by me today presents for stage IV cervical cancer. Patient had CTscan done and presents to discuss results and future management plan.  She was accompanied by her daughter. She denies SOB, cough, unintentional weight loss.  Patient reports left shoulder pain.  She has not take any over-the-counter pain reliever.   Review of Systems  Constitutional:  Positive for fatigue. Negative for appetite change, chills, fever and unexpected weight change.  HENT:   Negative for hearing loss and voice change.   Eyes:  Negative for eye problems.  Respiratory:  Negative for chest tightness and cough.   Cardiovascular:  Negative for chest pain.  Gastrointestinal:  Negative for abdominal distention, abdominal pain, blood in stool, nausea and vomiting.  Endocrine: Negative for hot flashes.  Genitourinary:  Negative for difficulty urinating, dysuria and frequency.   Musculoskeletal:  Positive for arthralgias.  Skin:  Negative for itching and rash.  Neurological:  Positive for  numbness. Negative for extremity weakness.  Hematological:  Negative for adenopathy.  Psychiatric/Behavioral:  Negative for confusion.    MEDICAL HISTORY:  Past Medical History:  Diagnosis Date   Cancer ([place])    cervical CA, that spread to lymph nodes; last chemo was 12/08/2019; radiation 01/2020   COVID-19 10/2018   Nausea with vomiting 10/31/2019   No pertinent past medical history     SURGICAL HISTORY: Past Surgical History:  Procedure Laterality Date   ESOPHAGOGASTRODUODENOSCOPY (EGD) WITH PROPOFOL N/A 03/28/2020   Procedure: ESOPHAGOGASTRODUODENOSCOPY (EGD) WITH PROPOFOL;  Surgeon: Milus Banister, MD;  Location: [facility];  Service: Endoscopy;  Laterality: N/A;   EUS N/A 03/28/2020   Procedure: UPPER ENDOSCOPIC ULTRASOUND (EUS) RADIAL;  Surgeon: Milus Banister, MD;  Location: [facility];  Service: Endoscopy;  Laterality: N/A;   no surgical history      SOCIAL HISTORY: Social History   Socioeconomic History   Marital status: Married    Spouse name: Rosezetta Schlatter    Number of children: 3   Years of education: Not on file   Highest education level: Not on file  Occupational History   Not on file  Tobacco Use   Smoking status: Never   Smokeless tobacco: Never  Vaping Use   Vaping Use: Never used  Substance and Sexual Activity   Alcohol use: Never   Drug use: Never   Sexual activity: Yes    Birth control/protection: None  Other Topics Concern   Not on file  Social History Narrative   Lives at home with spouse ; daughter [name] comes & stays when needed   Social Determinants of Health   Financial Resource Strain: Not on file  Food Insecurity: Not on file  Transportation Needs: Not on file  Physical Activity: Not on file  Stress: Not on file  Social Connections: Not on file  Intimate Partner Violence: Not on  file    FAMILY HISTORY: Family History  Problem Relation Age of Onset   Blindness Mother    Glaucoma Mother    Diabetes Father    Diabetes Brother      ALLERGIES:  has No Known Allergies.  MEDICATIONS:  Current Outpatient Medications  Medication Sig Dispense Refill   dexamethasone (DECADRON) 4 MG tablet Take 2 tablets (8 mg total) by mouth See admin instructions. Take 8mg  daily for 2 days after each chemotherapy-cisplatin treatments. (Patient not taking: Reported on 01/17/2021) 24 tablet 0   dexamethasone (DECADRON) 4 MG tablet Take 2 tablets (8 mg total) by mouth daily. Start the day after carboplatin chemotherapy for 3 days. (Patient not taking: Reported on 08/12/2020) 30 tablet 1   dicyclomine (BENTYL) 10 MG capsule Take 1 capsule (10 mg total) by mouth 4 (four) times daily -  before meals and at bedtime for 3 days. (Patient not taking: Reported on 01/17/2021) 12 capsule 0   loperamide (IMODIUM) 2 MG capsule Take 1 capsule (2 mg total) by mouth See admin instructions. Take 2 capsules with onset of diarrhea. Then take 1 capsule after every loose bowel movements. Maximum 8 capsules per 24 hours. (Patient not taking: Reported on 06/20/2020) 60 capsule 0   nystatin (MYCOSTATIN) 100000 UNIT/ML suspension Use as directed 5 mLs (500,000 Units total) in the mouth or throat 4 (four) times daily. (Patient not taking: Reported on 12/11/2019) 120 mL 0   OLANZapine (ZYPREXA) 10 MG tablet Take 1 tablet (10 mg total) by mouth at bedtime. (Patient not taking: Reported on 01/17/2021) 30 tablet 0   omeprazole (PRILOSEC) 20 MG capsule Take 1 capsule (20 mg total) by mouth daily. (Patient not taking: Reported on 06/20/2020) 30 capsule 1   ondansetron (ZOFRAN) 4 MG tablet Take 1 tablet (4 mg total) by mouth every 8 (eight) hours as needed for up to 10 doses for nausea or vomiting. (Patient not taking: Reported on 01/17/2021) 10 tablet 0   prochlorperazine (COMPAZINE) 10 MG tablet Take 1 tablet (10 mg total) by mouth every 6 (six) hours as needed (Nausea or vomiting). (Patient not taking: Reported on 08/28/2020) 30 tablet 1   promethazine (PHENERGAN) 25 MG suppository Place  1 suppository (25 mg total) rectally every 6 (six) hours as needed for nausea, vomiting or refractory nausea / vomiting. (Patient not taking: Reported on 08/28/2020) 120 each 3   senna (SENOKOT) 8.6 MG TABS tablet Take 2 tablets (17.2 mg total) by mouth daily. (Patient not taking: Reported on 02/12/2020) 120 tablet 0   sulfamethoxazole-trimethoprim (BACTRIM DS) 800-160 MG tablet Take 1 tablet by mouth 2 (two) times daily. (Patient not taking: Reported on 07/31/2020) 10 tablet 0   traMADol (ULTRAM) 50 MG tablet Take 1 tablet (50 mg total) by mouth every 12 (twelve) hours as needed. (Patient not taking: Reported on 01/17/2021) 60 tablet 0   No current facility-administered medications for this visit.     PHYSICAL EXAMINATION: ECOG PERFORMANCE STATUS: 1 - Symptomatic but completely ambulatory Vitals:   06/05/21 1405  BP: (!) 147/77  Pulse: (!) 113  Resp: 18  Temp: (!) 97.1 F (36.2 C)  SpO2: 98%   Filed Weights   06/05/21 1405  Weight: 185 lb (83.9 kg)    Physical Exam Constitutional:      General: She is not in acute distress.    Appearance: She is obese.  HENT:     Head: Normocephalic and atraumatic.  Eyes:     General: No scleral icterus. Neck:  Comments: Left supraclavicular lymphadenopathy Cardiovascular:     Rate and Rhythm: Normal rate and regular rhythm.     Heart sounds: Normal heart sounds.  Pulmonary:     Effort: Pulmonary effort is normal. No respiratory distress.     Breath sounds: No wheezing.  Abdominal:     General: Bowel sounds are normal. There is no distension.     Palpations: Abdomen is soft.  Musculoskeletal:        General: No deformity. Normal range of motion.     Cervical back: Normal range of motion and neck supple.  Skin:    General: Skin is warm and dry.     Findings: No erythema or rash.  Neurological:     Mental Status: She is alert and oriented to person, place, and time. Mental status is at baseline.     Cranial Nerves: No cranial nerve  deficit.     Coordination: Coordination normal.  Psychiatric:        Mood and Affect: Mood normal.    LABORATORY DATA:  I have reviewed the data as listed Lab Results  Component Value Date   WBC 5.7 12/09/2020   HGB 13.8 12/09/2020   HCT 41.7 12/09/2020   MCV 96.1 12/09/2020   PLT 245 12/09/2020   Recent Labs    06/20/20 0834 07/31/20 0816 12/09/20 1635 01/15/21 1127 06/03/21 0935  NA 139 138 139  --   --   K 3.8 3.5 4.2  --   --   CL 108 108 104  --   --   CO2 21* 21* 28  --   --   GLUCOSE 179* 183* 86  --   --   BUN 14 17 22*  --   --   CREATININE 0.65 0.72 0.73 0.60 0.70  CALCIUM 8.6* 9.0 9.2  --   --   GFRNONAA >60 >60 >60  --   --   PROT 7.3 6.8 8.0  --   --   ALBUMIN 3.8 3.5 4.4  --   --   AST 32 32 32  --   --   ALT 26 21 31   --   --   ALKPHOS 105 95 91  --   --   BILITOT 0.4 0.5 0.7  --   --     Iron/TIBC/Ferritin/ %Sat No results found for: IRON, TIBC, FERRITIN, IRONPCTSAT    RADIOGRAPHIC STUDIES: I have personally reviewed the radiological images as listed and agreed with the findings in the report. CT CHEST ABDOMEN PELVIS W CONTRAST  Result Date: 06/03/2021 CLINICAL DATA:  Metastatic cervical cancer restaging, status post radiation therapy and chemotherapy, assess treatment response EXAM: CT CHEST, ABDOMEN, AND PELVIS WITH CONTRAST TECHNIQUE: Multidetector CT imaging of the chest, abdomen and pelvis was performed following the standard protocol during bolus administration of intravenous contrast. RADIATION DOSE REDUCTION: This exam was performed according to the departmental dose-optimization program which includes automated exposure control, adjustment of the mA and/or kV according to patient size and/or use of iterative reconstruction technique. CONTRAST:  51mL OMNIPAQUE IOHEXOL 300 MG/ML SOLN additional oral enteric contrast COMPARISON:  01/15/2021 FINDINGS: CT CHEST FINDINGS Cardiovascular: Scattered aortic atherosclerosis. Normal heart size. No  pericardial effusion. Mediastinum/Nodes: Newly prominent subcentimeter left supraclavicular nodes, measuring up to 0.9 x 0.8 cm (series 2, image 4), as well as slight interval enlargement of a pretracheal nodes, measuring up to 0.7 x 0.6 cm, previously no greater than 0.5 cm (series 2, image 13). Thyroid gland, trachea,  and esophagus demonstrate no significant findings. Lungs/Pleura: Interval increase in bandlike scarring and consolidation of the left lung base, with elevation of the left hemidiaphragm. Multiple new and enlarged bilateral pulmonary nodules, an index nodule of the anterior left upper lobe, measuring 0.8 x 0.7 cm, previously no greater than 0.4 cm (series 4, image 28). New small nodule of the posterior left apex measuring 0.6 cm (series 4, image 23). No pleural effusion or pneumothorax. Musculoskeletal: No chest wall mass or suspicious osseous lesions identified. CT ABDOMEN PELVIS FINDINGS Hepatobiliary: No solid liver abnormality is seen. Hepatic steatosis. Multiple gallstones in the gallbladder. No gallbladder wall thickening, or biliary dilatation. Pancreas: Unremarkable. No pancreatic ductal dilatation or surrounding inflammatory changes. Spleen: Normal in size without significant abnormality. Adrenals/Urinary Tract: Adrenal glands are unremarkable. Kidneys are normal, without renal calculi, solid lesion, or hydronephrosis. Thickening of the decompressed urinary bladder wall (series 6, image 110). Stomach/Bowel: Stomach is within normal limits. Appendix appears normal. No evidence of bowel wall thickening, distention, or inflammatory changes. Vascular/Lymphatic: No significant vascular findings are present. Newly enlarged, matted appearing retroperitoneal lymph nodes, largest periaortic node measuring 1.5 x 1.2 cm (series 2, image 61). Reproductive: Known cervical mass is not clearly appreciated by CT. Other: No abdominal wall hernia or abnormality. No ascites. Musculoskeletal: New superior  endplate deformity of L4 (series 6, image 111). IMPRESSION: 1. Multiple new and enlarged bilateral pulmonary nodules, consistent with worsened pulmonary metastatic disease. 2. Newly enlarged, matted appearing retroperitoneal lymph nodes, consistent with nodal metastatic disease. 3. Newly prominent left supraclavicular and pretracheal lymph nodes, generally nonspecific although suspicious for additional sites of nodal metastatic disease in overall context. 4. Known cervical mass is not clearly appreciated by CT. 5. Interval increase in bandlike scarring and consolidation of the left lung base, with elevation of the left hemidiaphragm. This may reflect sequelae of interval infection or aspiration, or alternately developing radiation fibrosis if there has been local radiation therapy in this vicinity. 6. New superior endplate deformity of L4, without specific features to suggest pathologic fracture. Correlate for acute pain and point tenderness. Contrast enhanced MRI may be used to assess for fracture acuity as well as underlying metastatic lesion if desired. 7. Hepatic steatosis. 8. Cholelithiasis. Aortic Atherosclerosis (ICD10-I70.0). Electronically Signed   By: Delanna Ahmadi M.D.   On: 06/03/2021 14:26       ASSESSMENT & PLAN:  1. Goals of care, counseling/discussion   2. 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 [facility])    Cancer Staging  Malignant neoplasm of cervix [facility]) Staging form: Cervix Uteri, AJCC Version 9 - Clinical stage from 09/11/2019: FIGO Stage IIIC2r, calculated as Stage IIIC2 (cT1b2, cN2, cM0) - Signed by Earlie Server, MD on 09/11/2019 - Pathologic stage from 05/20/2020: FIGO Stage IVB (pTX, pNX, pM1) - Signed by Earlie Server, MD on 05/20/2020  #Metastatic cervix cancer, previously treated with 2 cycles chemotherapy treatment with carboplatin, Taxol, bevacizumab. -NED-- progression in 12/2021, not compliant with chemotherapy interpreter service for Spanish was utilized for this  encounter. 06/03/2021, CT chest abdomen pelvis showed multiple new and enlarging bilateral pulmonary nodules, new enlarged matted appearing retroperitoneal lymph nodes, newly prominent left supraclavicular pretracheal lymph nodes, interval increase in bandlike scarring and consolidation of left lung base.  This may reflect sequelae of interval infection or aspiration.  New superior endplate deformity of L4.  Hepatic steatosis.  I had a lengthy discussion with patient and her daughter. We reviewed the CT results and discussed that patient's condition has progressed.  Her condition  is not curable.  Goals of care is with palliative intent, recommend patient to resume chemotherapy with carboplatin Taxol and bevacizumab.  Previously her disease was sensitive to the treatment. Duration of treatment is continue until disease progression.  Patient is reluctant and and would like to further discuss with family members. Recommend patient to have CBC CMP blood work done today.  Patient declined. Patient and daughter will update cancer center if she decides to proceed with additional chemotherapy. Recommend patient to establish care with palliative care service for    Earlie Server, MD, PhD  06/05/2021

## 2021-06-05 NOTE — Progress Notes (Signed)
Patient here for CT results. Reports that she has been having occasional pain to left shoulder, radiating to neck.

## 2021-06-09 ENCOUNTER — Telehealth: Payer: Self-pay

## 2021-06-09 NOTE — Telephone Encounter (Signed)
Received message from [place] asking for an appointment with gyn oncology since she has been seen here before. It appears they filled out an online request for an appointment. I have reached out to her daughter, [name], and inquired if she wanted to come here since she was previously seen here. She stated that they were seeking a second opinion at [facility]. It was difficult to get her to understand that she was followed by a [facility] physician before she cancelled all her previous follow up appointments. Ultimately she has requested a new physician. I notified [facility] that she previously saw Dr. Fransisca Connors. We will be happy to reschedule with gyn oncology in the future if she desires.

## 2021-06-10 ENCOUNTER — Emergency Department
Admission: EM | Admit: 2021-06-10 | Discharge: 2021-06-11 | Disposition: A | Discharge status: Home / Self Care | Payer: BLUE CROSS/BLUE SHIELD | Attending: Emergency Medicine | Admitting: Emergency Medicine

## 2021-06-10 ENCOUNTER — Other Ambulatory Visit: Payer: Self-pay

## 2021-06-10 ENCOUNTER — Emergency Department: Payer: BLUE CROSS/BLUE SHIELD

## 2021-06-10 DIAGNOSIS — S46812A Strain of other muscles, fascia and tendons at shoulder and upper arm level, left arm, initial encounter: Secondary | ICD-10-CM | POA: Diagnosis not present

## 2021-06-10 DIAGNOSIS — X58XXXA Exposure to other specified factors, initial encounter: Secondary | ICD-10-CM | POA: Diagnosis not present

## 2021-06-10 DIAGNOSIS — Z8541 Personal history of malignant neoplasm of cervix uteri: Secondary | ICD-10-CM | POA: Diagnosis not present

## 2021-06-10 DIAGNOSIS — R0602 Shortness of breath: Secondary | ICD-10-CM | POA: Insufficient documentation

## 2021-06-10 DIAGNOSIS — S4992XA Unspecified injury of left shoulder and upper arm, initial encounter: Secondary | ICD-10-CM | POA: Diagnosis present

## 2021-06-10 DIAGNOSIS — M25512 Pain in left shoulder: Secondary | ICD-10-CM

## 2021-06-10 LAB — CBC
HCT: 38.9 % (ref 36.0–46.0)
Hemoglobin: 12.4 g/dL (ref 12.0–15.0)
MCH: 29.7 pg (ref 26.0–34.0)
MCHC: 31.9 g/dL (ref 30.0–36.0)
MCV: 93.3 fL (ref 80.0–100.0)
Platelets: 263 10*3/uL (ref 150–400)
RBC: 4.17 MIL/uL (ref 3.87–5.11)
RDW: 13.1 % (ref 11.5–15.5)
WBC: 6.5 10*3/uL (ref 4.0–10.5)
nRBC: 0 % (ref 0.0–0.2)

## 2021-06-10 LAB — BASIC METABOLIC PANEL
Anion gap: 8 (ref 5–15)
BUN: 21 mg/dL — ABNORMAL HIGH (ref 6–20)
CO2: 24 mmol/L (ref 22–32)
Calcium: 9.1 mg/dL (ref 8.9–10.3)
Chloride: 103 mmol/L (ref 98–111)
Creatinine, Ser: 0.81 mg/dL (ref 0.44–1.00)
GFR, Estimated: >60 mL/min (ref >60)
Glucose, Bld: 107 mg/dL — ABNORMAL HIGH (ref 70–99)
Potassium: 3.9 mmol/L (ref 3.5–5.1)
Sodium: 135 mmol/L (ref 135–145)

## 2021-06-10 LAB — TROPONIN I (HIGH SENSITIVITY)
Troponin I (High Sensitivity): 6 ng/L (ref <18)
Troponin I (High Sensitivity): 8 ng/L (ref <18)

## 2021-06-10 MED ORDER — METHOCARBAMOL 500 MG PO TABS
500.0000 mg | ORAL_TABLET | Freq: Once | ORAL | Status: AC
  Administered 2021-06-11: 500 mg via ORAL
  Filled 2021-06-10: qty 1

## 2021-06-10 MED ORDER — NAPROXEN 500 MG PO TABS
500.0000 mg | ORAL_TABLET | Freq: Once | ORAL | Status: AC
Start: 2021-06-10 — End: 2021-06-11
  Administered 2021-06-11: 500 mg via ORAL
  Filled 2021-06-10: qty 1

## 2021-06-10 MED ORDER — ACETAMINOPHEN 500 MG PO TABS
1000.0000 mg | ORAL_TABLET | Freq: Once | ORAL | Status: AC
  Administered 2021-06-11: 1000 mg via ORAL
  Filled 2021-06-10: qty 2

## 2021-06-10 MED ORDER — LIDOCAINE 5 % EX PTCH
1.0000 | MEDICATED_PATCH | CUTANEOUS | Status: DC
  Administered 2021-06-11: 1 via TRANSDERMAL
  Filled 2021-06-10: qty 1

## 2021-06-10 NOTE — ED Provider Notes (Signed)
[facility] Provider Note    Event Date/Time   First MD Initiated Contact with Patient 06/10/21 2256     (approximate)   History   Shoulder Pain   HPI  Jill Rodriguez is a 61 y.o. female who presents to the ED for evaluation of Shoulder Pain   I reviewed outpatient oncology visit from 5 days ago.  G3 P3 postmenopausal patient being treated for cervical cancer.  Stage IV metastatic.  Palliative chemotherapy was recommended, but patient declined.  Patient presents to the ED, accompanied by her daughter and husband, for evaluation of 1 week of atraumatic left shoulder pain.  I offered interpreter services, but family declines and prefers daughter to translate.  Patient reports 1 week of constant atraumatic, mostly posterior, left shoulder pain.  She reports pain from her trapezius, radiating upwards towards her left-sided paraspinal neck.  Reports pain radiating laterally towards her deltoid musculature.  Reports no improvement with Tylenol at home.  She reports no associated symptoms such as frontal chest pain, dyspnea, dizziness, falls or syncope.   Physical Exam   Triage Vital Signs: ED Triage Vitals [06/10/21 1912]  Enc Vitals Group     BP 138/78     Pulse Rate 91     Resp 18     Temp 98.5 F (36.9 C)     Temp Source Oral     SpO2 97 %     Weight 185 lb 3 oz (84 kg)     Height 5\' 1"  (1.549 m)     Head Circumference      Peak Flow      Pain Score 9     Pain Loc      Pain Edu?      Excl. in [place]?     Most recent vital signs: Vitals:   06/11/21 0251 06/11/21 0300  BP: (!) 147/75 (!) 141/78  Pulse: 86 86  Resp: 16   Temp:    SpO2: 97% 97%    General: Awake, no distress.  CV:  Good peripheral perfusion. RRR Resp:  Normal effort.  CTA B Abd:  No distention.  MSK:  No deformity noted.  Tenderness to palpation and palpable muscular cord is noted to the left trapezius muscle, reproducing her symptoms.  Left arm is distally  neurovascularly intact. Neuro:  No focal deficits appreciated. Other:     ED Results / Procedures / Treatments   Labs (all labs ordered are listed, but only abnormal results are displayed) Labs Reviewed  BASIC METABOLIC PANEL - Abnormal; Notable for the following components:      Result Value   Glucose, Bld 107 (*)    BUN 21 (*)    All other components within normal limits  CBC  POC URINE PREG, ED  TROPONIN I (HIGH SENSITIVITY)  TROPONIN I (HIGH SENSITIVITY)    EKG Sinus rhythm, rate of 93 bpm.  Normal axis and intervals.  1 PVC.  Nonspecific ST changes to anterior leads.  No STEMI.  RADIOLOGY CXR reviewed by me without evidence of acute cardiopulmonary pathology.  Official radiology report(s): DG Chest 2 View  Result Date: 06/10/2021 CLINICAL DATA:  Chest pain. EXAM: CHEST - 2 VIEW COMPARISON:  Chest radiograph dated 01/30/2020. FINDINGS: Mild eventration of the left hemidiaphragm with left lung base atelectasis. Pneumonia is not excluded. The right lung is clear. No pleural effusion pneumothorax. The cardiac silhouette is within limits. Degenerative changes of the spine. No acute osseous pathology. IMPRESSION: Mild left  lung base atelectasis. Pneumonia is not excluded. Electronically Signed   By: Anner Crete M.D.   On: 06/10/2021 19:31   CT Angio Chest PE W and/or Wo Contrast  Result Date: 06/11/2021 CLINICAL DATA:  Shortness of breath, left shoulder pain. Metastatic cervical cancer. EXAM: CT ANGIOGRAPHY CHEST WITH CONTRAST TECHNIQUE: Multidetector CT imaging of the chest was performed using the standard protocol during bolus administration of intravenous contrast. Multiplanar CT image reconstructions and MIPs were obtained to evaluate the vascular anatomy. RADIATION DOSE REDUCTION: This exam was performed according to the departmental dose-optimization program which includes automated exposure control, adjustment of the mA and/or kV according to patient size and/or use of  iterative reconstruction technique. CONTRAST:  153mL OMNIPAQUE IOHEXOL 350 MG/ML SOLN COMPARISON:  06/03/2021 FINDINGS: Cardiovascular: No filling defects in the pulmonary arteries to suggest pulmonary emboli. Heart is normal size. Aorta is normal caliber. Mediastinum/Nodes: Small scattered mediastinal lymph nodes, stable since prior staging study. No axillary or hilar adenopathy. Lungs/Pleura: Elevation of the left hemidiaphragm with left basilar scarring. Several scattered bilateral pulmonary nodules are stable. At least 1 nodule in the right upper lobe is cavitary, stable. No effusions. Upper Abdomen: Gallstones within the gallbladder. Musculoskeletal: Chest wall soft tissues are unremarkable. No acute bony abnormality. Review of the MIP images confirms the above findings. IMPRESSION: No evidence of pulmonary embolus. Evidence of metastatic disease with numerous small bilateral pulmonary nodules, stable since prior study. Small scattered mediastinal lymph nodes, stable. Stable elevation of the left hemidiaphragm with left base scarring. Cholelithiasis. Electronically Signed   By: Rolm Baptise M.D.   On: 06/11/2021 02:54    PROCEDURES and INTERVENTIONS:  Procedures  Medications  lidocaine (LIDODERM) 5 % 1 patch (1 patch Transdermal Patch Applied 06/11/21 0037)  acetaminophen (TYLENOL) tablet 1,000 mg (1,000 mg Oral Given 06/11/21 0036)  naproxen (NAPROSYN) tablet 500 mg (500 mg Oral Given 06/11/21 0036)  methocarbamol (ROBAXIN) tablet 500 mg (500 mg Oral Given 06/11/21 0036)  iohexol (OMNIPAQUE) 350 MG/ML injection 100 mL (100 mLs Intravenous Contrast Given 06/11/21 0207)     IMPRESSION / MDM / ASSESSMENT AND PLAN / ED COURSE  I reviewed the triage vital signs and the nursing notes.  61 year old female presents to the ED with atraumatic left shoulder pain, likely MSK in etiology with muscular strain/spasm, and ultimately suitable for outpatient management.  She has no tachycardia or hypoxia.  She  looks clinically well to me.  She is tender over her trapezius muscle primarily, and some lesser tenderness to left-sided paraspinal cervical musculature and her left deltoid.  No overlying skin changes, signs of trauma, neurologic or vascular deficits.  CXR is clear and basic labs are normal.  Troponin is negative and EKG is nonischemic.  Due to her cancer history, CTA chest obtained and without evidence of acute PE to cause this pain.  Ultimately has resolved pain after nonnarcotic multimodal analgesia.  Discussed safe multimodal analgesia at home and return precautions for the ED.  Urged her to follow-up with her oncologist.  Clinical Course as of 06/11/21 0414  Tue Jun 10, 2021  2333 Discussed plan of care with patient and family.  They would prefer to address MSK pain and pursue diagnostics for the possibility of PE after this only if she does not improve. [DS]  Wed Jun 11, 2021  0133 Reassessed.  Patient reports feeling a little bit better, but is requesting CT scan [DS]  0320 Reassessed.  Feeling much better.  We discussed reassuring CT.  We discussed  management of MSK symptoms at home and appropriate return precautions for the ED.  We discussed following up with oncology. [DS]    Clinical Course User Index [DS] Vladimir Crofts, MD     FINAL CLINICAL IMPRESSION(S) / ED DIAGNOSES   Final diagnoses:  Acute pain of left shoulder  Trapezius muscle strain, left, initial encounter     Rx / DC Orders   ED Discharge Orders          Ordered    lidocaine (LIDODERM) 5 %  Every 12 hours        06/11/21 0319    methocarbamol (ROBAXIN) 500 MG tablet  Every 8 hours PRN        06/11/21 0319             Note:  This document was prepared using Dragon voice recognition software and may include unintentional dictation errors.   Vladimir Crofts, MD 06/11/21 [phone]

## 2021-06-10 NOTE — ED Triage Notes (Signed)
Pt presents to ER c/o left shoulder pain for last week.  Pt denies any injury to shoulder.  Pt being treated for cervical cancer with mets. Pt has seen pcp and was told to just take tylenol.  Pt denies any relief with tylenol.  Pt states pain radiates to chest, neck and back.  Pt denies cardiac hx.  Pt A&O x4 at this time in NAD.

## 2021-06-11 ENCOUNTER — Encounter: Payer: Self-pay | Admitting: Radiology

## 2021-06-11 ENCOUNTER — Emergency Department: Payer: BLUE CROSS/BLUE SHIELD

## 2021-06-11 MED ORDER — METHOCARBAMOL 500 MG PO TABS: 500.0000 mg | ORAL_TABLET | Freq: Three times a day (TID) | ORAL | 0 refills | Status: DC | PRN

## 2021-06-11 MED ORDER — LIDOCAINE 5 % EX PTCH: 1.0000 | MEDICATED_PATCH | Freq: Two times a day (BID) | CUTANEOUS | 0 refills | Status: DC

## 2021-06-11 MED ORDER — IOHEXOL 350 MG/ML SOLN
100.0000 mL | Freq: Once | INTRAVENOUS | Status: AC | PRN
  Administered 2021-06-11: 100 mL via INTRAVENOUS

## 2021-06-11 NOTE — ED Notes (Signed)
Pt discharge information reviewed. Pt understands need for follow up care and when to return if symptoms worsen. All questions answered. Pt is alert and oriented with even and regular respirations. Pt is seen ambulating out of department with string steady gait with family.  °

## 2021-06-11 NOTE — ED Notes (Signed)
Patient transported to CT 

## 2021-06-11 NOTE — Discharge Instructions (Signed)
Use Tylenol for pain and fevers.  Up to 1000 mg per dose, up to 4 times per day.  Do not take more than 4000 mg of Tylenol/acetaminophen within 24 hours..  Please use lidocaine patches at your site of pain.  Apply 1 patch at a time, leave on for 12 hours, then remove for 12 hours.  12 hours on, 12 hours off.  Do not apply more than 1 patch at a time.  Use Robaxin muscle relaxer as needed for any more severe pain.  This medication may make you sleepy.  Be careful driving or operating machinery with this.

## 2021-06-26 IMAGING — CT NM PET TUM IMG RESTAG (PS) SKULL BASE T - THIGH
1 of 9 series · 1 of 25 positions shown · non-contrast
Comparison: PET-CT 02/06/2020

CLINICAL DATA: Subsequent treatment strategy for metastatic
cervical cancer.

EXAM:
NUCLEAR MEDICINE PET SKULL BASE TO THIGH
TECHNIQUE: 10.11 mCi F-18 FDG was injected intravenously. Full-ring PET imaging
was performed from the skull base to thigh after the radiotracer. CT
data was obtained and used for attenuation correction and anatomic
localization.
Fasting blood glucose: 99 mg/dl

[Series 3: ct wb 5.0 b30f · axial · 5.0mm · 0.98mm/px · 1 of 290 slices shown]
[im 290/290  brain]
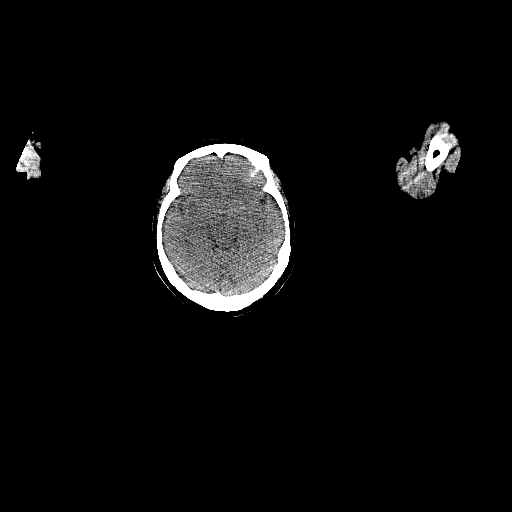

[1 of 25 positions shown; findings below may reference images not displayed]

FINDINGS: Mediastinal blood pool activity: SUV max

Liver activity: SUV max NA

NECK: No hypermetabolic lymph nodes in the neck.

Incidental CT findings: none

CHEST: No hypermetabolic mediastinal or hilar nodes. No suspicious
pulmonary nodules on the CT scan.

Incidental CT findings: Stable scattered vascular calcifications. No
worrisome pulmonary nodules.

ABDOMEN/PELVIS: No abnormal hypermetabolic activity within the
liver, pancreas, adrenal glands, or spleen. No hypermetabolic lymph
nodes in the abdomen or pelvis.

Incidental CT findings: Stable cholelithiasis. Stable scattered
vascular calcifications.

SKELETON: No significant bony findings.

Incidental CT findings: none
IMPRESSION: No findings suspicious for recurrent uterine/cervical cancer or
metastatic disease.

## 2021-07-04 ENCOUNTER — Telehealth: Payer: Self-pay | Admitting: Oncology

## 2021-07-04 ENCOUNTER — Telehealth: Payer: Self-pay | Admitting: *Deleted

## 2021-07-04 NOTE — Telephone Encounter (Signed)
Daughter called stating that patient has decided that she does want to take chemotherapy an dis asking for a call with an appointment for that.[phone] ?

## 2021-07-07 ENCOUNTER — Encounter: Payer: Self-pay | Admitting: Oncology

## 2021-07-07 ENCOUNTER — Telehealth: Payer: Self-pay | Admitting: *Deleted

## 2021-07-07 DIAGNOSIS — R053 Chronic cough: Secondary | ICD-10-CM

## 2021-07-07 NOTE — Telephone Encounter (Signed)
Error. Jill Rodriguez entered a telephone note. ?

## 2021-07-07 NOTE — Telephone Encounter (Signed)
I spoke with [name], she agrees to take patient to [facility] for CXR today and accepted appointment for tomorrow at [place] ?

## 2021-07-07 NOTE — Telephone Encounter (Signed)
[name] called asking if patient can be seen by someone as she is not feeling well. She reports that patient is having random spots of pain and that she is having coughing spells which she has had for a month and is not getting better. She was actually given medicine by doctor at [facility] when she went for second opinion but it is not helping either. She states that she usually cough when she eats until she gags on it then it will stop for a while. Please advise ?

## 2021-07-07 NOTE — Telephone Encounter (Signed)
Since Dr. Tasia Catchings is out of office, will wait for her to return and let us know what treatment needs to be scheduled, in case there have been any changes.  ? ?

## 2021-07-08 ENCOUNTER — Inpatient Hospital Stay: Payer: BLUE CROSS/BLUE SHIELD | Attending: Oncology | Admitting: Hospice and Palliative Medicine

## 2021-07-08 ENCOUNTER — Ambulatory Visit
Admission: RE | Admit: 2021-07-08 | Discharge: 2021-07-08 | Disposition: A | Discharge status: Home / Self Care | Payer: BLUE CROSS/BLUE SHIELD | Attending: Hospice and Palliative Medicine | Admitting: Hospice and Palliative Medicine

## 2021-07-08 ENCOUNTER — Telehealth: Payer: Self-pay | Admitting: *Deleted

## 2021-07-08 ENCOUNTER — Ambulatory Visit
Admission: RE | Admit: 2021-07-08 | Discharge: 2021-07-08 | Disposition: A | Discharge status: Home / Self Care | Payer: BLUE CROSS/BLUE SHIELD | Source: Ambulatory Visit | Attending: Hospice and Palliative Medicine | Admitting: Hospice and Palliative Medicine

## 2021-07-08 ENCOUNTER — Other Ambulatory Visit: Payer: Self-pay

## 2021-07-08 ENCOUNTER — Encounter: Payer: Self-pay | Admitting: Hospice and Palliative Medicine

## 2021-07-08 VITALS — BP 110/79 | HR 105 | Temp 98.4°F | Resp 18

## 2021-07-08 DIAGNOSIS — C538 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 uteri: Secondary | ICD-10-CM | POA: Diagnosis present

## 2021-07-08 DIAGNOSIS — R630 Anorexia: Secondary | ICD-10-CM | POA: Diagnosis not present

## 2021-07-08 DIAGNOSIS — B9620 Unspecified Escherichia coli [E. coli] as the cause of diseases classified elsewhere: Secondary | ICD-10-CM | POA: Diagnosis not present

## 2021-07-08 DIAGNOSIS — R112 Nausea with vomiting, unspecified: Secondary | ICD-10-CM | POA: Insufficient documentation

## 2021-07-08 DIAGNOSIS — R059 Cough, unspecified: Secondary | ICD-10-CM | POA: Insufficient documentation

## 2021-07-08 DIAGNOSIS — K5900 Constipation, unspecified: Secondary | ICD-10-CM | POA: Diagnosis not present

## 2021-07-08 DIAGNOSIS — R053 Chronic cough: Secondary | ICD-10-CM

## 2021-07-08 DIAGNOSIS — G893 Neoplasm related pain (acute) (chronic): Secondary | ICD-10-CM | POA: Diagnosis not present

## 2021-07-08 DIAGNOSIS — N3001 Acute cystitis with hematuria: Secondary | ICD-10-CM | POA: Diagnosis not present

## 2021-07-08 DIAGNOSIS — C539 Malignant neoplasm of cervix uteri, unspecified: Secondary | ICD-10-CM | POA: Diagnosis not present

## 2021-07-08 DIAGNOSIS — Z8616 Personal history of COVID-19: Secondary | ICD-10-CM | POA: Insufficient documentation

## 2021-07-08 DIAGNOSIS — C7800 Secondary malignant neoplasm of unspecified lung: Secondary | ICD-10-CM | POA: Diagnosis not present

## 2021-07-08 MED ORDER — HYDROCOD POLI-CHLORPHE POLI ER 10-8 MG/5ML PO SUER: 5.0000 mL | Freq: Two times a day (BID) | ORAL | 0 refills | Status: DC | PRN

## 2021-07-08 MED ORDER — TRAMADOL HCL 50 MG PO TABS: 50.0000 mg | ORAL_TABLET | Freq: Two times a day (BID) | ORAL | 0 refills | Status: DC | PRN

## 2021-07-08 MED ORDER — OMEPRAZOLE 20 MG PO CPDR: 20.0000 mg | DELAYED_RELEASE_CAPSULE | Freq: Every day | ORAL | 1 refills | Status: DC

## 2021-07-08 NOTE — Patient Instructions (Signed)
-   Start Senokot daily for constipation ?-Restart Omeprazole 20 mg daily ?-Tramadol as needed for pain  ?-Tussionex for cough ?

## 2021-07-08 NOTE — Progress Notes (Signed)
? ?Symptom Management Clinic ?[place] [facility] ?[phone] [phone] ? ?Patient Care Team: ?Pcp, No as PCP - General ?Clent Jacks, RN as Oncology Nurse Navigator ?Earlie Server, MD as Consulting Physician (Hematology and Oncology) ?Mellody Drown, MD as Consulting Physician (Gynecologic Oncology) ?Christel Mormon Marjory Sneddon, MD as Consulting Physician (Radiation Oncology) ?Noreene Filbert, MD as Consulting Physician (Radiation Oncology)  ? ?Name of the patient: Jill Rodriguez  ?[id number]  ?09/19/1960  ? ?Date of visit: 07/08/21 ? ?Reason for Consult: ?Jill Rodriguez is a 61 y.o. 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  ? ?Patient presents to [facility] today with several symptomatic complaints including chronic cough over the past month, which is causing some discomfort in her ribs/chest.  Patient reports that she coughs so much at times that it gives her nausea.  She also endorses generalized pain unrelieved with acetaminophen. ? ?Patient denies fever or chills.  She denies shortness of breath.  She denies rhinorrhea or sore throat.  Cough is mostly nonproductive with occasional phlegm.  Cough is not positional and not worse at night.  Patient denies smoking.  She denies wheezing.  She denies reflux but does endorse burning discomfort in the chest. ? ?Denies any neurologic complaints. Denies recent fevers or illnesses. Denies any easy bleeding or bruising. Reports poor appetite. Denies chest pain.  Denies urinary complaints. Patient offers no further specific complaints today. ? ?PAST MEDICAL HISTORY: ?Past Medical History:  ?Diagnosis Date  ? Cancer [facility])   ? cervical CA, that spread to lymph nodes; last chemo was 12/08/2019; radiation 01/2020  ? COVID-19 10/2018  ? Nausea with vomiting 10/31/2019  ? No pertinent past medical history    ? ? ?PAST SURGICAL HISTORY:  ?Past Surgical History:  ?Procedure Laterality Date  ? ESOPHAGOGASTRODUODENOSCOPY (EGD) WITH PROPOFOL N/A 03/28/2020  ? Procedure: ESOPHAGOGASTRODUODENOSCOPY (EGD) WITH PROPOFOL;  Surgeon: Milus Banister, MD;  Location: [facility] ENDOSCOPY;  Service: Endoscopy;  Laterality: N/A;  ? EUS N/A 03/28/2020  ? Procedure: UPPER ENDOSCOPIC ULTRASOUND (EUS) RADIAL;  Surgeon: Milus Banister, MD;  Location: [facility] ENDOSCOPY;  Service: Endoscopy;  Laterality: N/A;  ? no surgical history    ? ? ?HEMATOLOGY/ONCOLOGY HISTORY:  ?Oncology History  ?Malignant neoplasm of cervix ([place])  ?08/30/2019 Initial Diagnosis  ? Cervical cancer [facility]) ?  ?09/11/2019 Cancer Staging  ? Staging form: Cervix Uteri, AJCC Version 9 ?- Clinical stage from 09/11/2019: FIGO Stage IIIC2r, calculated as Stage IIIC2 (cT1b2, cN2, cM0) - Signed by Earlie Server, MD on 09/11/2019 ? ?  ?10/03/2019 - 11/02/2019 Chemotherapy  ?  ? ?  ? ?  ?05/20/2020 Cancer Staging  ? Staging form: Cervix Uteri, AJCC Version 9 ?- Pathologic stage from 05/20/2020: FIGO Stage IVB (pTX, pNX, pM1) - Signed by Earlie Server, MD on 05/20/2020 ? ?  ?05/30/2020 -  Chemotherapy  ? Patient is on Treatment Plan : Carboplatin + Paclitaxel + Bevacizumab q21d   ?   ? ? ?ALLERGIES:  has No Known Allergies. ? ?MEDICATIONS:  ?Current Outpatient Medications  ?Medication Sig Dispense Refill  ? guaiFENesin-codeine 100-10 MG/5ML syrup Take 5 mLs by mouth every 4 (four) hours as needed.    ? dexamethasone (DECADRON) 4 MG tablet Take 2 tablets (8 mg total) by mouth See admin instructions. Take '8mg'$  daily for 2 days after each chemotherapy-cisplatin treatments. (Patient not taking: Reported on 01/17/2021) 24 tablet 0  ?  dexamethasone (DECADRON) 4 MG tablet Take 2 tablets (8 mg total) by mouth daily. Start the day after carboplatin chemotherapy for 3 days. (Patient not taking: Reported on 08/12/2020) 30 tablet 1  ? dicyclomine (BENTYL) 10 MG capsule Take 1 capsule (10 mg total) by mouth 4 (four) times daily -   before meals and at bedtime for 3 days. (Patient not taking: Reported on 01/17/2021) 12 capsule 0  ? lidocaine (LIDODERM) 5 % Place 1 patch onto the skin every 12 (twelve) hours. Remove & Discard patch within 12 hours or as directed by MD (Patient not taking: Reported on 07/08/2021) 10 patch 0  ? loperamide (IMODIUM) 2 MG capsule Take 1 capsule (2 mg total) by mouth See admin instructions. Take 2 capsules with onset of diarrhea. Then take 1 capsule after every loose bowel movements. Maximum 8 capsules per 24 hours. (Patient not taking: Reported on 06/20/2020) 60 capsule 0  ? methocarbamol (ROBAXIN) 500 MG tablet Take 1 tablet (500 mg total) by mouth every 8 (eight) hours as needed for muscle spasms. (Patient not taking: Reported on 07/08/2021) 20 tablet 0  ? nystatin (MYCOSTATIN) 100000 UNIT/ML suspension Use as directed 5 mLs (500,000 Units total) in the mouth or throat 4 (four) times daily. (Patient not taking: Reported on 12/11/2019) 120 mL 0  ? OLANZapine (ZYPREXA) 10 MG tablet Take 1 tablet (10 mg total) by mouth at bedtime. (Patient not taking: Reported on 01/17/2021) 30 tablet 0  ? omeprazole (PRILOSEC) 20 MG capsule Take 1 capsule (20 mg total) by mouth daily. (Patient not taking: Reported on 06/20/2020) 30 capsule 1  ? ondansetron (ZOFRAN) 4 MG tablet Take 1 tablet (4 mg total) by mouth every 8 (eight) hours as needed for up to 10 doses for nausea or vomiting. (Patient not taking: Reported on 01/17/2021) 10 tablet 0  ? prochlorperazine (COMPAZINE) 10 MG tablet Take 1 tablet (10 mg total) by mouth every 6 (six) hours as needed (Nausea or vomiting). (Patient not taking: Reported on 08/28/2020) 30 tablet 1  ? promethazine (PHENERGAN) 25 MG suppository Place 1 suppository (25 mg total) rectally every 6 (six) hours as needed for nausea, vomiting or refractory nausea / vomiting. (Patient not taking: Reported on 08/28/2020) 120 each 3  ? senna (SENOKOT) 8.6 MG TABS tablet Take 2 tablets (17.2 mg total) by mouth daily.  (Patient not taking: Reported on 02/12/2020) 120 tablet 0  ? sulfamethoxazole-trimethoprim (BACTRIM DS) 800-160 MG tablet Take 1 tablet by mouth 2 (two) times daily. (Patient not taking: Reported on 07/31/2020) 10 tablet 0  ? traMADol (ULTRAM) 50 MG tablet Take 1 tablet (50 mg total) by mouth every 12 (twelve) hours as needed. (Patient not taking: Reported on 01/17/2021) 60 tablet 0  ? ?No current facility-administered medications for this visit.  ? ? ?VITAL SIGNS: ?BP 110/79 (BP Location: Left Arm, Patient Position: Sitting)   Pulse (!) 105   Temp 98.4 ?F (36.9 ?C)   Resp 18  ?There were no vitals filed for this visit.  ?Estimated body mass index is 34.99 kg/m? as calculated from the following: ?  Height as of 06/10/21: '5\' 1"'$  (1.549 m). ?  Weight as of 06/10/21: 185 lb 3 oz (84 kg). ? ?LABS: ?CBC: ?   ?Component Value Date/Time  ? WBC 6.5 06/10/2021 1922  ? HGB 12.4 06/10/2021 1922  ? HGB 13.8 08/23/2019 0915  ? HCT 38.9 06/10/2021 1922  ? HCT 42.0 08/23/2019 0915  ? PLT 263 06/10/2021 1922  ? PLT 254 08/23/2019 0915  ?  MCV 93.3 06/10/2021 1922  ? MCV 91 08/23/2019 0915  ? NEUTROABS 3.1 07/31/2020 0816  ? LYMPHSABS 0.9 07/31/2020 0816  ? MONOABS 0.4 07/31/2020 0816  ? EOSABS 0.1 07/31/2020 0816  ? BASOSABS 0.0 07/31/2020 0816  ? ?Comprehensive Metabolic Panel: ?   ?Component Value Date/Time  ? NA 135 06/10/2021 1922  ? NA 141 08/23/2019 0915  ? K 3.9 06/10/2021 1922  ? CL 103 06/10/2021 1922  ? CO2 24 06/10/2021 1922  ? BUN 21 (H) 06/10/2021 1922  ? BUN 14 08/23/2019 0915  ? CREATININE 0.81 06/10/2021 1922  ? GLUCOSE 107 (H) 06/10/2021 1922  ? CALCIUM 9.1 06/10/2021 1922  ? AST 32 12/09/2020 1635  ? ALT 31 12/09/2020 1635  ? ALKPHOS 91 12/09/2020 1635  ? BILITOT 0.7 12/09/2020 1635  ? BILITOT 0.4 08/23/2019 0915  ? PROT 8.0 12/09/2020 1635  ? PROT 7.6 08/23/2019 0915  ? ALBUMIN 4.4 12/09/2020 1635  ? ALBUMIN 4.5 08/23/2019 0915  ? ? ?RADIOGRAPHIC STUDIES: ?DG Chest 2 View ? ?Result Date: 07/08/2021 ?CLINICAL DATA:   Persistent cough EXAM: CHEST - 2 VIEW COMPARISON:  Previous studies including chest radiographs done on 06/10/2021 and CT done on 06/11/2021 FINDINGS: Cardiac size is within normal limits. Elevation of left hemidiaphragm m

## 2021-07-08 NOTE — Telephone Encounter (Signed)
Prior KASHENA NOVITSKI Key: [id number] ?Submitted to covermymeds for Tramadol. ?Pending insurance approval. ?

## 2021-07-08 NOTE — Telephone Encounter (Signed)
Josh- "This request has received a Unfavorable outcome.  Please see letter faxed to your office for details on this adverse benefit determination. Please note any additional information provided by Sherre Poot Monsey at the bottom of this request." ? ? ?No reason for denial given on the covermymeds site. ? ?Any suggestion? ?

## 2021-07-08 NOTE — Progress Notes (Signed)
Pt here for acute visit. Pt reports that she has had a cough that has been going on for about a month. Pt reports pain to ribs and back, she thinks its related to cough. She gets full fairly quickly and appetite is poor. Complains of Constipation- she has a BM approx every 3 days.  ?

## 2021-07-09 ENCOUNTER — Other Ambulatory Visit: Payer: Self-pay

## 2021-07-09 ENCOUNTER — Encounter: Payer: Self-pay | Admitting: *Deleted

## 2021-07-09 ENCOUNTER — Emergency Department: Payer: BLUE CROSS/BLUE SHIELD

## 2021-07-09 ENCOUNTER — Encounter: Payer: Self-pay | Admitting: Oncology

## 2021-07-09 ENCOUNTER — Emergency Department
Admission: EM | Admit: 2021-07-09 | Discharge: 2021-07-09 | Disposition: A | Discharge status: Home / Self Care | Payer: BLUE CROSS/BLUE SHIELD | Attending: Emergency Medicine | Admitting: Emergency Medicine

## 2021-07-09 DIAGNOSIS — Z8541 Personal history of malignant neoplasm of cervix uteri: Secondary | ICD-10-CM | POA: Insufficient documentation

## 2021-07-09 DIAGNOSIS — R55 Syncope and collapse: Secondary | ICD-10-CM | POA: Insufficient documentation

## 2021-07-09 DIAGNOSIS — T50905A Adverse effect of unspecified drugs, medicaments and biological substances, initial encounter: Secondary | ICD-10-CM | POA: Diagnosis not present

## 2021-07-09 DIAGNOSIS — Z20822 Contact with and (suspected) exposure to covid-19: Secondary | ICD-10-CM | POA: Insufficient documentation

## 2021-07-09 LAB — URINALYSIS, ROUTINE W REFLEX MICROSCOPIC
Bilirubin Urine: NEGATIVE
Glucose, UA: NEGATIVE mg/dL
Hgb urine dipstick: NEGATIVE
Ketones, ur: NEGATIVE mg/dL
Nitrite: POSITIVE — AB
Protein, ur: 100 mg/dL — AB
Specific Gravity, Urine: 1.016 (ref 1.005–1.030)
pH: 6 (ref 5.0–8.0)

## 2021-07-09 LAB — COMPREHENSIVE METABOLIC PANEL
ALT: 50 U/L — ABNORMAL HIGH (ref 0–44)
AST: 34 U/L (ref 15–41)
Albumin: 3.7 g/dL (ref 3.5–5.0)
Alkaline Phosphatase: 84 U/L (ref 38–126)
Anion gap: 6 (ref 5–15)
BUN: 14 mg/dL (ref 6–20)
CO2: 28 mmol/L (ref 22–32)
Calcium: 9.1 mg/dL (ref 8.9–10.3)
Chloride: 103 mmol/L (ref 98–111)
Creatinine, Ser: 0.85 mg/dL (ref 0.44–1.00)
GFR, Estimated: >60 mL/min (ref >60)
Glucose, Bld: 114 mg/dL — ABNORMAL HIGH (ref 70–99)
Potassium: 3.6 mmol/L (ref 3.5–5.1)
Sodium: 137 mmol/L (ref 135–145)
Total Bilirubin: 0.8 mg/dL (ref 0.3–1.2)
Total Protein: 7.4 g/dL (ref 6.5–8.1)

## 2021-07-09 LAB — RESP PANEL BY RT-PCR (FLU A&B, COVID) ARPGX2
Influenza A by PCR: NEGATIVE
Influenza B by PCR: NEGATIVE
SARS Coronavirus 2 by RT PCR: NEGATIVE

## 2021-07-09 LAB — CBC WITH DIFFERENTIAL/PLATELET
Abs Immature Granulocytes: 0.01 10*3/uL (ref 0.00–0.07)
Basophils Absolute: 0 10*3/uL (ref 0.0–0.1)
Basophils Relative: 1 %
Eosinophils Absolute: 0.1 10*3/uL (ref 0.0–0.5)
Eosinophils Relative: 2 %
HCT: 39 % (ref 36.0–46.0)
Hemoglobin: 12.3 g/dL (ref 12.0–15.0)
Immature Granulocytes: 0 %
Lymphocytes Relative: 29 %
Lymphs Abs: 1.4 10*3/uL (ref 0.7–4.0)
MCH: 29.7 pg (ref 26.0–34.0)
MCHC: 31.5 g/dL (ref 30.0–36.0)
MCV: 94.2 fL (ref 80.0–100.0)
Monocytes Absolute: 0.5 10*3/uL (ref 0.1–1.0)
Monocytes Relative: 10 %
Neutro Abs: 2.8 10*3/uL (ref 1.7–7.7)
Neutrophils Relative %: 58 %
Platelets: 266 10*3/uL (ref 150–400)
RBC: 4.14 MIL/uL (ref 3.87–5.11)
RDW: 13.2 % (ref 11.5–15.5)
WBC: 4.7 10*3/uL (ref 4.0–10.5)
nRBC: 0 % (ref 0.0–0.2)

## 2021-07-09 LAB — BRAIN NATRIURETIC PEPTIDE: B Natriuretic Peptide: 16.7 pg/mL (ref 0.0–100.0)

## 2021-07-09 LAB — TROPONIN I (HIGH SENSITIVITY): Troponin I (High Sensitivity): 6 ng/L (ref <18)

## 2021-07-09 MED ORDER — ONDANSETRON 8 MG PO TBDP: 8.0000 mg | ORAL_TABLET | Freq: Three times a day (TID) | ORAL | 0 refills | Status: DC | PRN

## 2021-07-09 MED ORDER — TRAMADOL HCL 50 MG PO TABS: 50.0000 mg | ORAL_TABLET | Freq: Two times a day (BID) | ORAL | 0 refills | Status: DC | PRN

## 2021-07-09 NOTE — Discharge Instructions (Addendum)
Please only use a half dose of your hydrocodone/chlorpheniramine until you are able to follow-up with your oncologist to prescribe this medication. ?

## 2021-07-09 NOTE — ED Triage Notes (Signed)
Family reports she had an episode of unresponsiveness they think she passed out. PT was sitting upright on couch when ems arrived.CA hx as well as a new script for a cough med from primary care yesterday. ?

## 2021-07-09 NOTE — Telephone Encounter (Signed)
Spent 1 hour and 15 mins discuss patient's appeal via telephone with [employer]. Since patient's insurance is 'federal bcbs', Pt's insurance requires patient to sign a consent form using the bcbs form to have the provider advocate for the patient's appeal. I have explained to [employer] that pt is currently in the emergency room and I am not able to obtain her written consent. I was informed by [employer] to send appeal form along with a statement- unable to obtain patient's signature. [employer] will consider the inability for patient to sign at this time.  The bcbs representatives were unable to accept a verbal appeal over the telehone.  Appeals form completed. I faxed the appeals form, included pt's office note, pt's aob HIPPA statement signed in our office yesterday and a letter requesting an appeal for supply limit. Patient has a quantity limit of 8 tablets of tramadol in a 30 days supply. ? ?Josh- please consider sending new script with quantity of 8 tablets of Tramadol. ?

## 2021-07-09 NOTE — Telephone Encounter (Signed)
Opened in error

## 2021-07-09 NOTE — Addendum Note (Signed)
Addended by: Altha Harm R on: 07/09/2021 12:29 PM ? ? Modules accepted: Orders ? ?

## 2021-07-09 NOTE — ED Provider Notes (Signed)
? ?[facility] ?Provider Note ? ? Event Date/Time  ? First MD Initiated Contact with Patient 07/09/21 [phone]   ?  (approximate) ?History  ?Loss of Consciousness ? ?HPI ?Jill Rodriguez is a 61 y.o. female with recent diagnosis of cancer of unknown origin who presents via EMS after reported episode of syncope just prior to arrival.  Family told EMS that patient was in her normal state of health sitting on the couch when she lost consciousness for less than 2 minutes and returned to baseline upon waking up.  Denies any shaking activity or loss of pulses/sensation of breathing.  Denies patient ever having similar symptoms in the past.  Further history/review of systems difficult to obtain as patient does not speak Spanish well ?Physical Exam  ?Triage Vital Signs: ?ED Triage Vitals  ?Enc Vitals Group  ?   BP 07/09/21 0723 133/60  ?   Pulse Rate 07/09/21 0723 70  ?   Resp 07/09/21 0723 15  ?   Temp 07/09/21 0723 98.3 ?F (36.8 ?C)  ?   Temp Source 07/09/21 0723 Oral  ?   SpO2 07/09/21 0720 94 %  ?   Weight 07/09/21 0722 185 lb (83.9 kg)  ?   Height 07/09/21 0722 '5\' 1"'$  (1.549 m)  ?   Head Circumference --   ?   Peak Flow --   ?   Pain Score --   ?   Pain Loc --   ?   Pain Edu? --   ?   Excl. in [place]? --   ? ?Most recent vital signs: ?Vitals:  ? 07/09/21 1030 07/09/21 1100  ?BP: 123/70 (!) 104/57  ?Pulse: 73 73  ?Resp: (!) 21 (!) 23  ?Temp:    ?SpO2: 96% 94%  ? ?General: Awake, oriented x4. ?CV:  Good peripheral perfusion.  ?Resp:  Normal effort.  ?Abd:  No distention.  ?Other:  Elderly overweight Hispanic female laying in bed in no distress ?ED Results / Procedures / Treatments  ?Labs ?(all labs ordered are listed, but only abnormal results are displayed) ?Labs Reviewed  ?COMPREHENSIVE METABOLIC PANEL - Abnormal; Notable for the following components:  ?    Result Value  ? Glucose, Bld 114 (*)   ? ALT 50 (*)   ? All other components within normal limits  ?URINALYSIS, ROUTINE W REFLEX MICROSCOPIC -  Abnormal; Notable for the following components:  ? Color, Urine YELLOW (*)   ? APPearance HAZY (*)   ? Protein, ur 100 (*)   ? Nitrite POSITIVE (*)   ? Leukocytes,Ua MODERATE (*)   ? Bacteria, UA MANY (*)   ? All other components within normal limits  ?RESP PANEL BY RT-PCR (FLU A&B, COVID) ARPGX2  ?CBC WITH DIFFERENTIAL/PLATELET  ?BRAIN NATRIURETIC PEPTIDE  ?TROPONIN I (HIGH SENSITIVITY)  ?TROPONIN I (HIGH SENSITIVITY)  ? ?EKG ?ED ECG REPORT ?I, Jill Rodriguez, the attending physician, personally viewed and interpreted this ECG. ?Date: 07/09/2021 ?EKG Time: 0725 ?Rate: 65 ?Rhythm: normal sinus rhythm ?QRS Axis: normal ?Intervals: normal ?ST/T Wave abnormalities: normal ?Narrative Interpretation: no evidence of acute ischemia ?RADIOLOGY ?ED MD interpretation: 2 view chest x-ray as interpreted by me shows linear densities in the left lower lung field suggesting of subsegmental atelectasis as well as small left pleural effusion with a few scattered subsegmental nodular densities in both lungs suggesting possible pulmonary metastatic disease that is better appreciated on CT.  No change from previous x-ray or any signs of acute abnormalities ?-Agree with  radiology assessment ?Official radiology report(s): ?DG Chest Port 1 View ? ?Result Date: 07/09/2021 ?CLINICAL DATA:  61 year old female with syncope, unresponsive. Metastatic cervical cancer. EXAM: PORTABLE CHEST 1 VIEW COMPARISON:  Chest CT 06/11/2021. FINDINGS: Portable AP upright view at 0735 hours. Pronounced elevation of the left hemidiaphragm as on the recent CT is largely new since 2021 and suspicious for left phrenic nerve palsy. Superimposed borderline to mild cardiomegaly. Other mediastinal contours are within normal limits. Visualized tracheal air column is within normal limits. Left lung base atelectasis continues. No pneumothorax, pulmonary edema, definite pleural effusion, or new pulmonary opacity. Numerous small pulmonary nodules with an upper lobe  predominance word better demonstrated by the CT last month. No acute osseous abnormality identified. Negative visible bowel gas. IMPRESSION: 1. Small suspected pulmonary metastases better demonstrated by CT last month. 2. Ongoing elevation of the left hemidiaphragm appears new since 2021 and is suspicious for left phrenic nerve palsy. Associated atelectasis. 3. No new cardiopulmonary abnormality. Electronically Signed   By: Genevie Ann M.D.   On: 07/09/2021 08:00   ?PROCEDURES: ?Critical Care performed: No ?.1-3 Lead EKG Interpretation ?Performed by: Jill Plummer, MD ?Authorized by: Jill Plummer, MD  ? ?  Interpretation: normal   ?  ECG rate:  74 ?  ECG rate assessment: normal   ?  Rhythm: sinus rhythm   ?  Ectopy: none   ?  Conduction: normal   ?MEDICATIONS ORDERED IN ED: ?Medications - No data to display ?IMPRESSION / MDM / ASSESSMENT AND PLAN / ED COURSE  ?I reviewed the triage vital signs and the nursing notes. ?             ?               ?Differential diagnosis includes, but is not limited to, medication overdose, arrhythmia, orthostatic syncope, neurogenic syncope ?The patient is on the cardiac monitor to evaluate for evidence of arrhythmia and/or significant heart rate changes. ?Patient is a 61 year old Hispanic female who presents via EMS after an episode of presumed syncope.  Upon chart review of patient's office visit yesterday 07/08/2021 with Dr. Regenia Skeeter in oncology, patient has documented history of malignant neoplasm of the cervix with metastasis.  Patient was placed on hydrocodone/chlorpheniramine yesterday as needed for coughing. ?ED Workup:  ?CBC, BMP, Troponin, BNP, ECG, CXR ?Differential diagnosis includes HF, ICH, seizure, stroke, HOCM, ACS, aortic dissection, malignant arrhythmia, or GI bleed. ?Findings: ?No evidence of acute laboratory abnormalities.  Troponin negative x1 ?EKG: No e/o STEMI. No evidence of Brugada?s sign, delta wave, epsilon wave, significantly prolonged QTc, or malignant  arrhythmia. ? ?Disposition: Discharge. Patient is at baseline at this time. Return precautions expressed and understood in person. Advised follow up with primary care provider or clinic physician in next 24 hours. ?  ?FINAL CLINICAL IMPRESSION(S) / ED DIAGNOSES  ? ?Final diagnoses:  ?Syncope and collapse  ?Medication side effect, initial encounter  ? ?Rx / DC Orders  ? ?ED Discharge Orders   ? ?      Ordered  ?  ondansetron (ZOFRAN-ODT) 8 MG disintegrating tablet  Every 8 hours PRN       ? 07/09/21 1119  ? ?  ?  ? ?  ? ?Note:  This document was prepared using Dragon voice recognition software and may include unintentional dictation errors. ?  ?Jill Plummer, MD ?07/09/21 1147 ? ?

## 2021-07-15 ENCOUNTER — Encounter: Payer: Self-pay | Admitting: Oncology

## 2021-07-17 ENCOUNTER — Telehealth: Payer: Self-pay | Admitting: Primary Care

## 2021-07-17 NOTE — Telephone Encounter (Signed)
Spoke briefly with patient's daughter [name], regarding the Palliative referral, daughter was driving at the time of my call and requested I call her back around 2 PM. ?

## 2021-07-17 NOTE — Telephone Encounter (Signed)
Returned call to patient's daughter [name] to offer to schedule the Palliative Consult, no answer and unable to leave message due to mailbox was full. ?

## 2021-07-22 ENCOUNTER — Telehealth: Payer: Self-pay | Admitting: Primary Care

## 2021-07-22 ENCOUNTER — Telehealth: Payer: Self-pay

## 2021-07-22 ENCOUNTER — Telehealth: Payer: Self-pay | Admitting: *Deleted

## 2021-07-22 ENCOUNTER — Other Ambulatory Visit: Payer: Self-pay

## 2021-07-22 DIAGNOSIS — C539 Malignant neoplasm of cervix uteri, unspecified: Secondary | ICD-10-CM

## 2021-07-22 NOTE — Telephone Encounter (Signed)
Attempted to contact patient's daughter [name], to offer to schedule a Palliative Consult, no answer and unable to leave a message due to mailbox was full. ?

## 2021-07-22 NOTE — Telephone Encounter (Signed)
Please schedule patient for labs/ [facility]) tomorrow. If symptoms worsen, ER strongly recommended.  ?

## 2021-07-22 NOTE — Telephone Encounter (Signed)
Waiting on Dr. Tasia Catchings to come out of the room. Jill Rodriguez is going to wait on Dr Collie Siad recommendation and forward to scheduling to get scheduled.

## 2021-07-22 NOTE — Telephone Encounter (Signed)
[name] called with concerns that the patient just urinated and the toilet bowl was full of dark red blood. She has never ad this before, states that when she was diagnosed with cancer, she only had spotting. She denies pain fever or anything else except for no appetite. She is asking if patient needs to go to ER. I told her that unless it happens again or she becomes in distress, she should not need ER visit, but to go if these occur. I told her I would see if we can get her seen tomorrow in clinic and she was grateful for that. Please advise ?

## 2021-07-23 ENCOUNTER — Other Ambulatory Visit: Payer: Self-pay

## 2021-07-23 ENCOUNTER — Inpatient Hospital Stay (HOSPITAL_BASED_OUTPATIENT_CLINIC_OR_DEPARTMENT_OTHER): Payer: BLUE CROSS/BLUE SHIELD | Admitting: Hospice and Palliative Medicine

## 2021-07-23 ENCOUNTER — Encounter: Payer: Self-pay | Admitting: Oncology

## 2021-07-23 ENCOUNTER — Inpatient Hospital Stay: Payer: BLUE CROSS/BLUE SHIELD

## 2021-07-23 VITALS — BP 131/59 | HR 92 | Temp 98.2°F | Resp 16 | Wt 178.0 lb

## 2021-07-23 DIAGNOSIS — C539 Malignant neoplasm of cervix uteri, unspecified: Secondary | ICD-10-CM

## 2021-07-23 DIAGNOSIS — R319 Hematuria, unspecified: Secondary | ICD-10-CM | POA: Diagnosis not present

## 2021-07-23 DIAGNOSIS — C538 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 uteri: Secondary | ICD-10-CM | POA: Diagnosis not present

## 2021-07-23 DIAGNOSIS — N390 Urinary tract infection, site not specified: Secondary | ICD-10-CM

## 2021-07-23 LAB — CBC WITH DIFFERENTIAL/PLATELET
Abs Immature Granulocytes: 0.01 10*3/uL (ref 0.00–0.07)
Basophils Absolute: 0 10*3/uL (ref 0.0–0.1)
Basophils Relative: 1 %
Eosinophils Absolute: 0.1 10*3/uL (ref 0.0–0.5)
Eosinophils Relative: 2 %
HCT: 38 % (ref 36.0–46.0)
Hemoglobin: 12.6 g/dL (ref 12.0–15.0)
Immature Granulocytes: 0 %
Lymphocytes Relative: 29 %
Lymphs Abs: 1.1 10*3/uL (ref 0.7–4.0)
MCH: 31 pg (ref 26.0–34.0)
MCHC: 33.2 g/dL (ref 30.0–36.0)
MCV: 93.4 fL (ref 80.0–100.0)
Monocytes Absolute: 0.4 10*3/uL (ref 0.1–1.0)
Monocytes Relative: 9 %
Neutro Abs: 2.3 10*3/uL (ref 1.7–7.7)
Neutrophils Relative %: 59 %
Platelets: 230 10*3/uL (ref 150–400)
RBC: 4.07 MIL/uL (ref 3.87–5.11)
RDW: 13 % (ref 11.5–15.5)
WBC: 3.9 10*3/uL — ABNORMAL LOW (ref 4.0–10.5)
nRBC: 0 % (ref 0.0–0.2)

## 2021-07-23 LAB — COMPREHENSIVE METABOLIC PANEL
ALT: 31 U/L (ref 0–44)
AST: 31 U/L (ref 15–41)
Albumin: 4 g/dL (ref 3.5–5.0)
Alkaline Phosphatase: 87 U/L (ref 38–126)
Anion gap: 6 (ref 5–15)
BUN: 12 mg/dL (ref 6–20)
CO2: 26 mmol/L (ref 22–32)
Calcium: 9 mg/dL (ref 8.9–10.3)
Chloride: 103 mmol/L (ref 98–111)
Creatinine, Ser: 0.76 mg/dL (ref 0.44–1.00)
GFR, Estimated: >60 mL/min (ref >60)
Glucose, Bld: 109 mg/dL — ABNORMAL HIGH (ref 70–99)
Potassium: 3.8 mmol/L (ref 3.5–5.1)
Sodium: 135 mmol/L (ref 135–145)
Total Bilirubin: 0.4 mg/dL (ref 0.3–1.2)
Total Protein: 7.5 g/dL (ref 6.5–8.1)

## 2021-07-23 LAB — URINALYSIS, COMPLETE (UACMP) WITH MICROSCOPIC
Bilirubin Urine: NEGATIVE
Glucose, UA: NEGATIVE mg/dL
Ketones, ur: NEGATIVE mg/dL
Nitrite: NEGATIVE
Protein, ur: NEGATIVE mg/dL
Specific Gravity, Urine: 1.004 — ABNORMAL LOW (ref 1.005–1.030)
pH: 6 (ref 5.0–8.0)

## 2021-07-23 MED ORDER — NITROFURANTOIN MONOHYD MACRO 100 MG PO CAPS: 100.0000 mg | ORAL_CAPSULE | Freq: Two times a day (BID) | ORAL | 0 refills | Status: DC

## 2021-07-23 NOTE — Progress Notes (Signed)
? ?[facility]pt[facility] ?[place] [facility] ?[phone] [phone] ? ?Patient Care Team: ?Pcp, No as PCP - General ?Clent Jacks, RN as Oncology Nurse Navigator ?Earlie Server, MD as Consulting Physician (Hematology and Oncology) ?Mellody Drown, MD as Consulting Physician (Gynecologic Oncology) ?Christel Mormon Marjory Sneddon, MD as Consulting Physician (Radiation Oncology) ?Noreene Filbert, MD as Consulting Physician (Radiation Oncology)  ? ?Name of the patient: Jill Rodriguez  ?[id number]  ?12/20/1960  ? ?Date of visit: 07/23/21 ? ?Reason for Consult: ?Alexyia Guarino is a 61 y.o. 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  ? ?Patient presents to [facility] today for evaluation of hematuria.  Patient reports 1 episode of dark red urine with a blood clot yesterday.  She says all subsequent urine has been yellow in coloration.  She denies any bleeding elsewhere.  She denies any urinary symptoms such as burning, urgency, or frequency. ? ?Patient denies fever or chills.  She denies shortness of breath.  Denies any neurologic complaints. Denies recent fevers or illnesses. Denies any easy bleeding or bruising. Reports poor appetite. Denies chest pain. Patient offers no further specific complaints today. ? ?PAST MEDICAL HISTORY: ?Past Medical History:  ?Diagnosis Date  ? Cancer [facility])   ? cervical CA, that spread to lymph nodes; last chemo was 12/08/2019; radiation 01/2020  ? COVID-19 10/2018  ? Nausea with vomiting 10/31/2019  ? Neoplasm related pain   ? No pertinent past medical history   ? ? ?PAST SURGICAL HISTORY:  ?Past Surgical History:  ?Procedure Laterality Date  ? ESOPHAGOGASTRODUODENOSCOPY (EGD) WITH PROPOFOL N/A 03/28/2020  ? Procedure: ESOPHAGOGASTRODUODENOSCOPY (EGD) WITH PROPOFOL;  Surgeon: Milus Banister, MD;  Location: [facility] ENDOSCOPY;   Service: Endoscopy;  Laterality: N/A;  ? EUS N/A 03/28/2020  ? Procedure: UPPER ENDOSCOPIC ULTRASOUND (EUS) RADIAL;  Surgeon: Milus Banister, MD;  Location: [facility] ENDOSCOPY;  Service: Endoscopy;  Laterality: N/A;  ? no surgical history    ? ? ?HEMATOLOGY/ONCOLOGY HISTORY:  ?Oncology History  ?Malignant neoplasm of cervix ([place])  ?08/30/2019 Initial Diagnosis  ? Cervical cancer [facility]) ?  ?09/11/2019 Cancer Staging  ? Staging form: Cervix Uteri, AJCC Version 9 ?- Clinical stage from 09/11/2019: FIGO Stage IIIC2r, calculated as Stage IIIC2 (cT1b2, cN2, cM0) - Signed by Earlie Server, MD on 09/11/2019 ?  ?10/03/2019 - 11/02/2019 Chemotherapy  ?  ? ?  ?  ?05/20/2020 Cancer Staging  ? Staging form: Cervix Uteri, AJCC Version 9 ?- Pathologic stage from 05/20/2020: FIGO Stage IVB (pTX, pNX, pM1) - Signed by Earlie Server, MD on 05/20/2020 ?  ?05/30/2020 -  Chemotherapy  ? Patient is on Treatment Plan : Carboplatin + Paclitaxel + Bevacizumab q21d   ?   ? ? ?ALLERGIES:  has No Known Allergies. ? ?MEDICATIONS:  ?Current Outpatient Medications  ?Medication Sig Dispense Refill  ? chlorpheniramine-HYDROcodone 10-8 MG/5ML Take 5 mLs by mouth every 12 (twelve) hours as needed for cough. 115 mL 0  ? dexamethasone (DECADRON) 4 MG tablet Take 2 tablets (8 mg total) by mouth See admin instructions. Take '8mg'$  daily for 2 days after each chemotherapy-cisplatin treatments. (Patient not taking: Reported on 01/17/2021) 24 tablet 0  ? dexamethasone (DECADRON) 4 MG tablet Take 2 tablets (8 mg total) by mouth daily. Start the day after carboplatin chemotherapy for 3 days. (Patient not taking: Reported on 08/12/2020) 30 tablet 1  ? dicyclomine (BENTYL) 10 MG capsule  Take 1 capsule (10 mg total) by mouth 4 (four) times daily -  before meals and at bedtime for 3 days. (Patient not taking: Reported on 01/17/2021) 12 capsule 0  ? guaiFENesin-codeine 100-10 MG/5ML syrup Take 5 mLs by mouth every 4 (four) hours as needed.    ? lidocaine (LIDODERM) 5 % Place 1 patch onto the skin  every 12 (twelve) hours. Remove & Discard patch within 12 hours or as directed by MD (Patient not taking: Reported on 07/08/2021) 10 patch 0  ? loperamide (IMODIUM) 2 MG capsule Take 1 capsule (2 mg total) by mouth See admin instructions. Take 2 capsules with onset of diarrhea. Then take 1 capsule after every loose bowel movements. Maximum 8 capsules per 24 hours. (Patient not taking: Reported on 06/20/2020) 60 capsule 0  ? methocarbamol (ROBAXIN) 500 MG tablet Take 1 tablet (500 mg total) by mouth every 8 (eight) hours as needed for muscle spasms. (Patient not taking: Reported on 07/08/2021) 20 tablet 0  ? nystatin (MYCOSTATIN) 100000 UNIT/ML suspension Use as directed 5 mLs (500,000 Units total) in the mouth or throat 4 (four) times daily. (Patient not taking: Reported on 12/11/2019) 120 mL 0  ? OLANZapine (ZYPREXA) 10 MG tablet Take 1 tablet (10 mg total) by mouth at bedtime. (Patient not taking: Reported on 01/17/2021) 30 tablet 0  ? omeprazole (PRILOSEC) 20 MG capsule Take 1 capsule (20 mg total) by mouth daily. 30 capsule 1  ? ondansetron (ZOFRAN) 4 MG tablet Take 1 tablet (4 mg total) by mouth every 8 (eight) hours as needed for up to 10 doses for nausea or vomiting. (Patient not taking: Reported on 01/17/2021) 10 tablet 0  ? ondansetron (ZOFRAN-ODT) 8 MG disintegrating tablet Take 1 tablet (8 mg total) by mouth every 8 (eight) hours as needed for nausea or vomiting. 20 tablet 0  ? prochlorperazine (COMPAZINE) 10 MG tablet Take 1 tablet (10 mg total) by mouth every 6 (six) hours as needed (Nausea or vomiting). (Patient not taking: Reported on 08/28/2020) 30 tablet 1  ? promethazine (PHENERGAN) 25 MG suppository Place 1 suppository (25 mg total) rectally every 6 (six) hours as needed for nausea, vomiting or refractory nausea / vomiting. (Patient not taking: Reported on 08/28/2020) 120 each 3  ? senna (SENOKOT) 8.6 MG TABS tablet Take 2 tablets (17.2 mg total) by mouth daily. (Patient not taking: Reported on 02/12/2020)  120 tablet 0  ? sulfamethoxazole-trimethoprim (BACTRIM DS) 800-160 MG tablet Take 1 tablet by mouth 2 (two) times daily. (Patient not taking: Reported on 07/31/2020) 10 tablet 0  ? traMADol (ULTRAM) 50 MG tablet Take 1 tablet (50 mg total) by mouth every 12 (twelve) hours as needed. 8 tablet 0  ? ?No current facility-administered medications for this visit.  ? ? ?VITAL SIGNS: ?BP (!) 131/59   Pulse 92   Temp 98.2 ?F (36.8 ?C) (Tympanic)   Resp 16   Wt 178 lb (80.7 kg)   SpO2 99%   BMI 33.63 kg/m?  ?Filed Weights  ? 07/23/21 1122  ?Weight: 178 lb (80.7 kg)  ?  ?Estimated body mass index is 33.63 kg/m? as calculated from the following: ?  Height as of 07/09/21: '5\' 1"'$  (1.549 m). ?  Weight as of this encounter: 178 lb (80.7 kg). ? ?LABS: ?CBC: ?   ?Component Value Date/Time  ? WBC 3.9 (L) 07/23/2021 1043  ? HGB 12.6 07/23/2021 1043  ? HGB 13.8 08/23/2019 0915  ? HCT 38.0 07/23/2021 1043  ? HCT 42.0 08/23/2019  0915  ? PLT 230 07/23/2021 1043  ? PLT 254 08/23/2019 0915  ? MCV 93.4 07/23/2021 1043  ? MCV 91 08/23/2019 0915  ? NEUTROABS 2.3 07/23/2021 1043  ? LYMPHSABS 1.1 07/23/2021 1043  ? MONOABS 0.4 07/23/2021 1043  ? EOSABS 0.1 07/23/2021 1043  ? BASOSABS 0.0 07/23/2021 1043  ? ?Comprehensive Metabolic Panel: ?   ?Component Value Date/Time  ? NA 135 07/23/2021 1043  ? NA 141 08/23/2019 0915  ? K 3.8 07/23/2021 1043  ? CL 103 07/23/2021 1043  ? CO2 26 07/23/2021 1043  ? BUN 12 07/23/2021 1043  ? BUN 14 08/23/2019 0915  ? CREATININE 0.76 07/23/2021 1043  ? GLUCOSE 109 (H) 07/23/2021 1043  ? CALCIUM 9.0 07/23/2021 1043  ? AST 31 07/23/2021 1043  ? ALT 31 07/23/2021 1043  ? ALKPHOS 87 07/23/2021 1043  ? BILITOT 0.4 07/23/2021 1043  ? BILITOT 0.4 08/23/2019 0915  ? PROT 7.5 07/23/2021 1043  ? PROT 7.6 08/23/2019 0915  ? ALBUMIN 4.0 07/23/2021 1043  ? ALBUMIN 4.5 08/23/2019 0915  ? ? ?RADIOGRAPHIC STUDIES: ?DG Chest 2 View ? ?Result Date: 07/08/2021 ?CLINICAL DATA:  Persistent cough EXAM: CHEST - 2 VIEW COMPARISON:   Previous studies including chest radiographs done on 06/10/2021 and CT done on 06/11/2021 FINDINGS: Cardiac size is within normal limits. Elevation of left hemidiaphragm may be due to eventration or paralysis. Linear den

## 2021-07-23 NOTE — Progress Notes (Signed)
Video interpreter used for visit. Pt reports that she observed blood in her urine yesterday with presence of a blood clot. She has not seen any further blood since that episode. Pt denies fever or dysuria. Reports back pain to lower left side, but attributes this to chronic cough. Also reports that she is not sleeping at night.  ?

## 2021-07-25 ENCOUNTER — Encounter: Payer: Self-pay | Admitting: Oncology

## 2021-07-25 ENCOUNTER — Inpatient Hospital Stay (HOSPITAL_BASED_OUTPATIENT_CLINIC_OR_DEPARTMENT_OTHER): Payer: BLUE CROSS/BLUE SHIELD | Admitting: Oncology

## 2021-07-25 VITALS — BP 136/77 | HR 98 | Temp 98.6°F | Resp 18 | Wt 176.6 lb

## 2021-07-25 DIAGNOSIS — R112 Nausea with vomiting, unspecified: Secondary | ICD-10-CM | POA: Diagnosis not present

## 2021-07-25 DIAGNOSIS — R053 Chronic cough: Secondary | ICD-10-CM | POA: Diagnosis not present

## 2021-07-25 DIAGNOSIS — C530 Malignant neoplasm of endocervix: Secondary | ICD-10-CM | POA: Diagnosis not present

## 2021-07-25 DIAGNOSIS — Z7189 Other specified counseling: Secondary | ICD-10-CM | POA: Diagnosis not present

## 2021-07-25 DIAGNOSIS — C538 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 uteri: Secondary | ICD-10-CM

## 2021-07-25 DIAGNOSIS — N3001 Acute cystitis with hematuria: Secondary | ICD-10-CM

## 2021-07-25 LAB — URINE CULTURE: Culture: 50000 — AB

## 2021-07-25 MED ORDER — PROCHLORPERAZINE MALEATE 10 MG PO TABS: 10.0000 mg | ORAL_TABLET | Freq: Four times a day (QID) | ORAL | 1 refills | Status: DC | PRN

## 2021-07-25 MED ORDER — PROMETHAZINE HCL 25 MG RE SUPP: 25.0000 mg | Freq: Three times a day (TID) | RECTAL | 1 refills | Status: DC | PRN

## 2021-07-25 MED ORDER — TRAZODONE HCL 50 MG PO TABS: 50.0000 mg | ORAL_TABLET | Freq: Every evening | ORAL | 1 refills | Status: DC | PRN

## 2021-07-25 NOTE — Progress Notes (Signed)
Patient here for follow up. Report pain from left shoulder down to left buttock. She has nausea and no appetite. Daughter would like to see if pt can be tested for H.Pylori.  ?

## 2021-07-25 NOTE — Progress Notes (Signed)
?Hematology/Oncology Progress note ?[phone] [phone] ?  ? ? ? ?Patient Care Team: ?Pcp, No as PCP - General ?Clent Jacks, RN as Oncology Nurse Navigator ?Earlie Server, MD as Consulting Physician (Hematology and Oncology) ?Mellody Drown, MD as Consulting Physician (Gynecologic Oncology) ?Christel Mormon Marjory Sneddon, MD as Consulting Physician (Radiation Oncology) ?Noreene Filbert, MD as Consulting Physician (Radiation Oncology) ? ?REFERRING PROVIDER: ?No ref. provider found  ?CHIEF COMPLAINTS/REASON FOR VISIT:  ?Follow up for cervical cancer ? ?HISTORY OF PRESENTING ILLNESS:  ? ?Jill Rodriguez is a  61 y.o.  female with PMH listed below was seen in consultation at the request of  No ref. provider found  for evaluation of cervical cancer.  ? ?Patient is G3, P3 Spanish-speaking postmenopausal female who initially presented with vaginal bleeding.  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  No intervention seen. ? ?08/23/2019, pelvis ultrasound showed endometrium measures 8 mm.  Echogenic endometrial microcalcification seen throughout endometrium.  Hypoechoic area in the fundal aspect of the endometrium measures 0.8 x 1.1 x 2.3 cm ?Survey of the adnexa showed no adnexal masses. ? ?Patient has history of prominent right neck pelvis and CT angio neck has been ordered. ?Patient was seen by Dr. Fransisca Connors, clinically the tumor is 3 cm and replaced whole cervix. ?09/06/2019 PET scan showed hypermetabolic mass involving the cervix and the lower uterine segment consistent with known cervical cancer. Multiple small hypermetabolic retroperitoneal and pelvic lymph node bilaterally consistent with nodal metastasis. ?She also has small hypermetabolic lymph nodes in the left superior mediastinum and left  supraclavicular regions are also suspicious for nodal metastasis.  Hepatic steatosis no cholelithiasis noted. ? ?At least Stage IIIc Cervix cancer, with small hypermetabolic lymph nodes in the left superior mediastinum and left supraclavicular region which could put her to stage IV disease. hypermetabolic activity initially improved after concurrent chemoradiation. ? ?# Cancer Treatment:  ?July 2021 Finished concurrent chemoradiation with weekly Cisplatin.  ? ?# 02/06/2020 repeat PET scan showed recurrent hypermetabolic left mediastinum lymph node.  No additional sites of recurrent hypermetabolic metastasis.  Residual mild hypermetabolism within the uterine cervix, slightly decreased.  2 small lung nodules with activity below PET resolution. Nonspecific persistent mild hypermetabolic 7 in the lower thoracic esophageal without discrete mass.  Diffuse hepatic steatosis, cholelithiasis atherosclerosis ?# She underwent EUS for further evaluation of esophageal hypermetabolic activity as well attempt to get biopsy, not feasible ? ?#PET scan done at [facility], 04/03/2020, ?PET scan showed subcentimeter hypermetabolic cervical, mediastinal, retrocrural lymph nodes, concerning for lymphatic metastasis.  Multiple tiny indeterminate pulmonary nodule below the resolution of PET.  No abnormal FDG uptake within the cervix at the site of known primary malignancy. ?Patient has also developed a more supraclavicular fullness.  Patient was seen by Dr. Fransisca Connors on 05/01/2020.  Decision was made to proceed with biopsy of the left supraclavicular lymph node. ? ?05/09/2020 left supraclavicular lymph node biopsy pathology was reviewed and discussed. ?This is consistent with stage IV, metastatic cervix cancer with cervical, thoracic, retrocrural lymph node metastasis. ? ?05/30/2020 - 07/10/2020 2 cycles of carboplatin, Taxol and bevacizumab.  ?Patient adamantly declines further treatment ?08/08/2020, PET scan showed resolution of previous hypermetabolic  activity.  No findings suspicious for metastatic cancer. ?Discussed with patient and daughter that she has NED, cancer may recur in the future. ?I recommend additional 2 cycles  of carboplatin and Taxol/bevacizumab to further consolidate and she may be switched to maintenance and patient declined.  ? ?01/15/2021 CT chest abdomen pelvis w contrast ?multiple new small pulmonary nodules scattered throughout the lungs, measuring 4 mm and smaller and highly suspicious for pulmonary metastatic disease. No evidence of lymphadenopathy or metastatic disease in the abdomen or pelvis. Known cervical mass is not discretely appreciated. ?Patient was recommended to resume chemotherapy.  Patient initially agreed and did not show up chemotherapy appointments.  Multiple attempts to reach patient and her family member were non-successful ? ? ?INTERVAL HISTORY ?Jill Rodriguez is a 61 y.o. female who has above history reviewed by me today presents for follow-up stage IV cervical cancer. ?Patient previously did not want to have chemotherapy. ?07/08/2021, patient was seen by Altha Harm for symptom management.  Patient has had chronic cough over the past month, causing some discomfort in her chest.  Patient was placed on hydrocodone/chlorpheniramine yesterday as needed for coughing ?Chest x-ray showed small suspected pulmonary metastasis.  Ongoing elevation of left hemidiaphragm appears new since 2021.  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  Associated atelectasis. ?07/09/2021, patient was sent to ER.via  EMS due to syncope.  In the emergency room, work-up was negative.  Patient was back to her baseline.  Daughter feels that this is due to the hydrocodone/chlorpheniramine ?07/23/2021, patient was seen by symptom management clinic due to blood in urine.  Urine is positive.  Patient was started on Augmentin.  Patient denies any flank pain, fever or chills. ? ?During the interval, patient also had a second opinion with [facility] and was seen by Dr. Theora Gianotti.  Recommendation was reviewed. ?Today patient present to discuss about resuming chemotherapy. ?She was accompanied by her daughter.  Daughter request that her mom being tested for H. pylori.  Daughter was recently tested positive for H. pylori and was treated.  Patient has some chronic nausea and Zofran ODT did not help. ? ?Review of Systems  ?Constitutional:  Positive for fatigue. Negative for appetite change, chills, fever and unexpected weight change.  ?HENT:   Negative for hearing loss and voice change.   ?Eyes:  Negative for eye problems.  ?Respiratory:  Negative for chest tightness and cough.   ?Cardiovascular:  Negative for chest pain.  ?Gastrointestinal:  Positive for nausea. Negative for abdominal distention, abdominal pain, blood in stool and vomiting.  ?Endocrine: Negative for hot flashes.  ?Genitourinary:  Negative for difficulty urinating, dysuria and frequency.   ?Musculoskeletal:  Positive for arthralgias.  ?Skin:  Negative for itching and rash.  ?Neurological:  Positive for numbness. Negative for extremity weakness.  ?Hematological:  Negative for adenopathy.  ?Psychiatric/Behavioral:  Negative for confusion.   ? ?MEDICAL HISTORY:  ?Past Medical History:  ?Diagnosis Date  ? Cancer [facility])   ? cervical CA, that spread to lymph nodes; last chemo was 12/08/2019; radiation 01/2020  ? COVID-19 10/2018  ? Nausea with vomiting 10/31/2019  ? Neoplasm related pain   ? No pertinent past medical history   ? ? ?SURGICAL HISTORY: ?Past Surgical History:  ?Procedure Laterality Date  ? ESOPHAGOGASTRODUODENOSCOPY (EGD) WITH PROPOFOL N/A 03/28/2020  ? Procedure: ESOPHAGOGASTRODUODENOSCOPY (EGD) WITH PROPOFOL;  Surgeon: Milus Banister, MD;  Location: [facility] END[facility];  Service: Endoscopy;  Laterality: N/A;  ? EUS N/A 03/28/2020  ? Procedure: UPPER ENDOSCOPIC ULTRASOUND (EUS) RADIAL;  Surgeon: Milus Banister, MD;  Location: [facility] END[facility];  Service: Endoscopy;  Laterality: N/A;  ? no  surgical history    ? ? ?  SOCIAL HISTORY: ?Social History  ? ?Socioeconomic History  ? Marital status: Married  ?  Spouse name: Rosezetta Schlatter   ? Number of children: 3  ? Years of education: Not on file  ? Highest education

## 2021-07-31 ENCOUNTER — Other Ambulatory Visit: Payer: Self-pay | Admitting: Hospice and Palliative Medicine

## 2021-07-31 ENCOUNTER — Telehealth: Payer: Self-pay | Admitting: Oncology

## 2021-07-31 DIAGNOSIS — R112 Nausea with vomiting, unspecified: Secondary | ICD-10-CM

## 2021-07-31 DIAGNOSIS — C530 Malignant neoplasm of endocervix: Secondary | ICD-10-CM

## 2021-07-31 NOTE — Telephone Encounter (Signed)
Per [place] Chin patient is out of network with [employer] and chemo cannot be authorized at this location. Multiple attempts made to reach patient (as well as spouse and daughter [name]) with language line. All contacts did not answer and had full mailboxes so that I was unable to leave a message.  ?

## 2021-08-01 ENCOUNTER — Encounter: Payer: Self-pay | Admitting: Licensed Clinical Social Worker

## 2021-08-01 NOTE — Progress Notes (Signed)
[place] ?Clinical Social Work ? ?Clinical Social Work was referred by  Palliative Care NP  for assessment of psychosocial needs.  Clinical Social Worker  spoke with patient's daughter Shana Chute [phone], and updated her on referral request and  to offer support and assess for needs.  CSW updated Ms. Sosa that I tried contacting patient and spouse but they did not answer their phones and the voicemail is full.  CSW gave Ms. Sosa contact information, informed her that I speak Spanish and request a return call form the patient or spouse.  Ms. Brynda Rim stated she would give the patient the message. ? ? ?Chelsey Kimberley, LCSW  ?Clinical Social Worker ?[place] ?      ? ?

## 2021-08-04 ENCOUNTER — Telehealth: Payer: Self-pay

## 2021-08-04 NOTE — Telephone Encounter (Signed)
Message sent to [facility] gyn oncology team that Ms. Jill Rodriguez no longer has insurance in network and Dr. Tasia Catchings is requesting for chemotherapy to be arranged at [facility] ?

## 2021-08-05 ENCOUNTER — Telehealth: Payer: Self-pay | Admitting: Primary Care

## 2021-08-05 NOTE — Telephone Encounter (Signed)
Attempted to contact patient's daughter [name] to schedule Palliative Consult, no answer - unable to leave message due to mailbox was full.  Will cancel referral and notify referring providers and Palliative Team.  ?

## 2021-08-06 ENCOUNTER — Other Ambulatory Visit: Payer: BLUE CROSS/BLUE SHIELD

## 2021-08-06 ENCOUNTER — Ambulatory Visit: Payer: BLUE CROSS/BLUE SHIELD

## 2021-08-06 ENCOUNTER — Ambulatory Visit: Payer: BLUE CROSS/BLUE SHIELD | Admitting: Oncology

## 2021-08-10 ENCOUNTER — Emergency Department
Admission: EM | Admit: 2021-08-10 | Discharge: 2021-08-10 | Disposition: A | Discharge status: Other Acute Inpatient Hospital | Payer: BLUE CROSS/BLUE SHIELD | Attending: Emergency Medicine | Admitting: Emergency Medicine

## 2021-08-10 ENCOUNTER — Emergency Department: Payer: BLUE CROSS/BLUE SHIELD

## 2021-08-10 ENCOUNTER — Other Ambulatory Visit: Payer: Self-pay

## 2021-08-10 DIAGNOSIS — J168 Pneumonia due to other specified infectious organisms: Secondary | ICD-10-CM | POA: Diagnosis not present

## 2021-08-10 DIAGNOSIS — J189 Pneumonia, unspecified organism: Secondary | ICD-10-CM

## 2021-08-10 DIAGNOSIS — C799 Secondary malignant neoplasm of unspecified site: Secondary | ICD-10-CM

## 2021-08-10 DIAGNOSIS — Z20822 Contact with and (suspected) exposure to covid-19: Secondary | ICD-10-CM | POA: Insufficient documentation

## 2021-08-10 DIAGNOSIS — R0602 Shortness of breath: Secondary | ICD-10-CM | POA: Diagnosis present

## 2021-08-10 DIAGNOSIS — J9601 Acute respiratory failure with hypoxia: Secondary | ICD-10-CM | POA: Insufficient documentation

## 2021-08-10 DIAGNOSIS — R1084 Generalized abdominal pain: Secondary | ICD-10-CM | POA: Diagnosis not present

## 2021-08-10 DIAGNOSIS — E876 Hypokalemia: Secondary | ICD-10-CM | POA: Diagnosis not present

## 2021-08-10 LAB — CBC WITH DIFFERENTIAL/PLATELET
Abs Immature Granulocytes: 0.03 10*3/uL (ref 0.00–0.07)
Basophils Absolute: 0 10*3/uL (ref 0.0–0.1)
Basophils Relative: 1 %
Eosinophils Absolute: 0.1 10*3/uL (ref 0.0–0.5)
Eosinophils Relative: 2 %
HCT: 36.3 % (ref 36.0–46.0)
Hemoglobin: 11.9 g/dL — ABNORMAL LOW (ref 12.0–15.0)
Immature Granulocytes: 1 %
Lymphocytes Relative: 29 %
Lymphs Abs: 1.5 10*3/uL (ref 0.7–4.0)
MCH: 30.4 pg (ref 26.0–34.0)
MCHC: 32.8 g/dL (ref 30.0–36.0)
MCV: 92.8 fL (ref 80.0–100.0)
Monocytes Absolute: 0.5 10*3/uL (ref 0.1–1.0)
Monocytes Relative: 10 %
Neutro Abs: 3.1 10*3/uL (ref 1.7–7.7)
Neutrophils Relative %: 57 %
Platelets: 236 10*3/uL (ref 150–400)
RBC: 3.91 MIL/uL (ref 3.87–5.11)
RDW: 12.9 % (ref 11.5–15.5)
WBC: 5.3 10*3/uL (ref 4.0–10.5)
nRBC: 0 % (ref 0.0–0.2)

## 2021-08-10 LAB — COMPREHENSIVE METABOLIC PANEL
ALT: 25 U/L (ref 0–44)
AST: 28 U/L (ref 15–41)
Albumin: 3.7 g/dL (ref 3.5–5.0)
Alkaline Phosphatase: 81 U/L (ref 38–126)
Anion gap: 7 (ref 5–15)
BUN: 13 mg/dL (ref 6–20)
CO2: 26 mmol/L (ref 22–32)
Calcium: 8.8 mg/dL — ABNORMAL LOW (ref 8.9–10.3)
Chloride: 106 mmol/L (ref 98–111)
Creatinine, Ser: 0.68 mg/dL (ref 0.44–1.00)
GFR, Estimated: >60 mL/min (ref >60)
Glucose, Bld: 112 mg/dL — ABNORMAL HIGH (ref 70–99)
Potassium: 3.3 mmol/L — ABNORMAL LOW (ref 3.5–5.1)
Sodium: 139 mmol/L (ref 135–145)
Total Bilirubin: 0.9 mg/dL (ref 0.3–1.2)
Total Protein: 7.5 g/dL (ref 6.5–8.1)

## 2021-08-10 LAB — TROPONIN I (HIGH SENSITIVITY): Troponin I (High Sensitivity): 6 ng/L (ref <18)

## 2021-08-10 LAB — RESP PANEL BY RT-PCR (FLU A&B, COVID) ARPGX2
Influenza A by PCR: NEGATIVE
Influenza B by PCR: NEGATIVE
SARS Coronavirus 2 by RT PCR: NEGATIVE

## 2021-08-10 LAB — BRAIN NATRIURETIC PEPTIDE: B Natriuretic Peptide: 19.5 pg/mL (ref 0.0–100.0)

## 2021-08-10 LAB — LIPASE, BLOOD: Lipase: 28 U/L (ref 11–51)

## 2021-08-10 MED ORDER — SODIUM CHLORIDE 0.9 % IV SOLN
500.0000 mg | Freq: Once | INTRAVENOUS | Status: AC
  Administered 2021-08-10: 500 mg via INTRAVENOUS
  Filled 2021-08-10: qty 5

## 2021-08-10 MED ORDER — ONDANSETRON HCL 4 MG/2ML IJ SOLN
4.0000 mg | Freq: Once | INTRAMUSCULAR | Status: AC
  Administered 2021-08-10: 4 mg via INTRAVENOUS
  Filled 2021-08-10: qty 2

## 2021-08-10 MED ORDER — IOHEXOL 350 MG/ML SOLN
100.0000 mL | Freq: Once | INTRAVENOUS | Status: AC | PRN
  Administered 2021-08-10: 100 mL via INTRAVENOUS

## 2021-08-10 MED ORDER — SODIUM CHLORIDE 0.9 % IV SOLN
1.0000 g | INTRAVENOUS | Status: AC
  Administered 2021-08-10: 1 g via INTRAVENOUS
  Filled 2021-08-10: qty 10

## 2021-08-10 MED ORDER — SODIUM CHLORIDE 0.9 % IV SOLN: INTRAVENOUS | Status: DC

## 2021-08-10 MED ORDER — SODIUM CHLORIDE 0.9 % IV BOLUS
500.0000 mL | Freq: Once | INTRAVENOUS | Status: AC
  Administered 2021-08-10: 500 mL via INTRAVENOUS

## 2021-08-10 MED ORDER — VANCOMYCIN HCL 2000 MG/400ML IV SOLN
2000.0000 mg | Freq: Once | INTRAVENOUS | Status: AC
  Administered 2021-08-10: 2000 mg via INTRAVENOUS
  Filled 2021-08-10: qty 400

## 2021-08-10 NOTE — ED Notes (Signed)
EMTALA Reviewed by this RN.  

## 2021-08-10 NOTE — ED Notes (Addendum)
RN to bedside to introduce self to pt. Pt resting. Family at bedside. Pt asked to use the restroom. Pt ambulated to toilet with no assistance then back to bed.  ?

## 2021-08-10 NOTE — ED Notes (Signed)
Patient began to cough so hard and long that she vomited. Verbal order received from provider for Zofran.  ?

## 2021-08-10 NOTE — ED Provider Notes (Signed)
? ?[facility] ?Provider Note ? ? ? Event Date/Time  ? First MD Initiated Contact with Patient 08/10/21 0236   ?  (approximate) ? ? ?History  ? ?Loss of Consciousness ? ? ?The patient and/or family speak(s) Spanish.  They understand they have the right to the use of a hospital interpreter, however at this time they prefer to speak directly with me in [place].  They know that they can ask for an interpreter at any time. ? ? ?HPI ? ?Jill Rodriguez is a 61 y.o. female with past medical history that includes, per her report, chronic back pain, frequent abdominal pain, nausea and vomiting, and a prior history of cervical cancer that spread to the left node status post radiation and chemotherapy that concluded about 2 years ago.  She presents tonight by EMS for constellation of symptoms.  She says that she was asleep but then woke up and then passed out again.  She was also feeling short of breath with chest pain and was 87% on room air for EMS, now on 2 L of oxygen and satting in the upper 90s. ? ?She is also reporting nausea and vomiting, which is a frequent occurrence for her, along with generalized abdominal pain.  She also says that she frequently has abdominal pain but this is worse than usual and is all over.  She also is reporting pain in her back but she reports frequently having back pain although this also feels worse than normal. ? ?No recent fever.  No nasal congestion or sore throat.  No difficulty or pain when she urinates. ?  ? ? ?Physical Exam  ? ?Triage Vital Signs: ?ED Triage Vitals  ?Enc Vitals Group  ?   BP 08/10/21 0230 115/79  ?   Pulse Rate 08/10/21 0230 69  ?   Resp 08/10/21 0230 18  ?   Temp 08/10/21 0230 98.1 ?F (36.7 ?C)  ?   Temp Source 08/10/21 0230 Oral  ?   SpO2 08/10/21 0230 98 %  ?   Weight 08/10/21 0232 84.5 kg (186 lb 4.6 oz)  ?   Height 08/10/21 0232 1.549 m ('5\' 1"'$ )  ?   Head Circumference --   ?   Peak Flow --   ?   Pain Score 08/10/21 0231 8  ?   Pain Loc  --   ?   Pain Edu? --   ?   Excl. in [place]? --   ? ? ?Most recent vital signs: ?Vitals:  ? 08/10/21 0630 08/10/21 0700  ?BP: (!) 130/95 (!) 110/57  ?Pulse: 88 78  ?Resp:  16  ?Temp:    ?SpO2: 98% 97%  ? ? ? ?General: Awake, no distress but appears somewhat uncomfortable. ?CV:  Good peripheral perfusion.  Normal heart sounds. ?Resp:  Normal effort.  Lungs are clear to auscultation. ?Abd:  No distention.  Generalized tenderness to palpation everywhere, some guarding but it appears voluntary.  No clear evidence of peritonitis. ? ? ?ED Results / Procedures / Treatments  ? ?Labs ?(all labs ordered are listed, but only abnormal results are displayed) ?Labs Reviewed  ?CBC WITH DIFFERENTIAL/PLATELET - Abnormal; Notable for the following components:  ?    Result Value  ? Hemoglobin 11.9 (*)   ? All other components within normal limits  ?COMPREHENSIVE METABOLIC PANEL - Abnormal; Notable for the following components:  ? Potassium 3.3 (*)   ? Glucose, Bld 112 (*)   ? Calcium 8.8 (*)   ?  All other components within normal limits  ?RESP PANEL BY RT-PCR (FLU A&B, COVID) ARPGX2  ?LIPASE, BLOOD  ?BRAIN NATRIURETIC PEPTIDE  ?TROPONIN I (HIGH SENSITIVITY)  ? ? ? ?EKG ? ?ED ECG REPORT ?IHinda Kehr, the attending physician, personally viewed and interpreted this ECG. ? ?Date: 08/10/2021 ?EKG Time: 2:26 AM ?Rate: 69 ?Rhythm: normal sinus rhythm ?QRS Axis: Left axis deviation ?Intervals: Short PR interval at 119 ms ?ST/T Wave abnormalities: normal ?Narrative Interpretation: no evidence of acute ischemia ? ? ? ?RADIOLOGY ?Extensive metastatic disease involving lungs and abdomen.  Loculated multi lobar pneumonia.  Most likely malignant effusion.  Severe (chronic) elevated left hemidiaphragm. ? ? ? ?PROCEDURES: ? ?Critical Care performed: Yes, see critical care procedure note(s) ? ?.1-3 Lead EKG Interpretation ?Performed by: Hinda Kehr, MD ?Authorized by: Hinda Kehr, MD  ? ?  Interpretation: normal   ?  ECG rate:  80 ?  ECG rate  assessment: normal   ?  Rhythm: sinus rhythm   ?  Ectopy: none   ?  Conduction: normal   ?.Critical Care ?Performed by: Hinda Kehr, MD ?Authorized by: Hinda Kehr, MD  ? ?Critical care provider statement:  ?  Critical care time (minutes):  40 ?  Critical care time was exclusive of:  Separately billable procedures and treating other patients ?  Critical care was necessary to treat or prevent imminent or life-threatening deterioration of the following conditions:  Respiratory failure ?  Critical care was time spent personally by me on the following activities:  Development of treatment plan with patient or surrogate, evaluation of patient's response to treatment, examination of patient, obtaining history from patient or surrogate, ordering and performing treatments and interventions, ordering and review of laboratory studies, ordering and review of radiographic studies, pulse oximetry, re-evaluation of patient's condition and review of old charts ? ? ?MEDICATIONS ORDERED IN ED: ?Medications  ?azithromycin (ZITHROMAX) 500 mg in sodium chloride 0.9 % 250 mL IVPB (500 mg Intravenous New Bag/Given 08/10/21 0712)  ?0.9 %  sodium chloride infusion ( Intravenous New Bag/Given 08/10/21 0631)  ?vancomycin (VANCOREADY) IVPB 2000 mg/400 mL (has no administration in time range)  ?sodium chloride 0.9 % bolus 500 mL (0 mLs Intravenous Stopped 08/10/21 0616)  ?iohexol (OMNIPAQUE) 350 MG/ML injection 100 mL (100 mLs Intravenous Contrast Given 08/10/21 0438)  ?cefTRIAXone (ROCEPHIN) 1 g in sodium chloride 0.9 % 100 mL IVPB (0 g Intravenous Stopped 08/10/21 0703)  ? ? ? ?IMPRESSION / MDM / ASSESSMENT AND PLAN / ED COURSE  ?I reviewed the triage vital signs and the nursing notes. ?             ?               ? ?Differential diagnosis includes, but is not limited to, acute on chronic pain (nonspecific), PE, pneumonia, viral illness, acute intra-abdominal infection, neoplasm, UTI. ? ?The patient's vital signs are notable for hypoxemia in  the field prior to arrival.  She is also reporting shortness of breath and chest pain as well as back pain and has a history of cancer.  Little utility for D-dimer. ? ?I obtained a chest x-ray which I personally reviewed.  It is notable for an extremely elevated left hemidiaphragm, difficult to interpret beyond that.  However the radiologist commented on possible infiltrate versus effusion, but also commented that the elevation of the hemidiaphragm seems stable. ? ?Additionally the patient is reporting nausea, vomiting, and abdominal tenderness.  She is a vague historian but it seems that  this is not an uncommon issue for her.  However she is reporting tenderness everywhere I palpate in her abdomen. ? ?Given the complex constellation of symptoms and history as well as the patient being a vague historian, I will further evaluate with a CTA of the chest to rule out pulmonary embolism and better visualize the lung parenchyma, as well as a CT scan of the abdomen and pelvis which will help to rule out acute intra-abdominal infection and give a better picture together of the thorax and abdomen and possible issues that could be leading to her symptoms. ? ?I initially ordered CMP, respiratory viral panel, BNP, lipase, and CBC with differential.  I reviewed the results and the CMP is essentially normal other than a mild hypokalemia.  CBC is within normal limits.  Lipase is normal.  BNP is normal, respiratory viral panel is negative.  After the patient reported the chest pain, I added on a high-sensitivity troponin which is currently pending. ? ?I also reviewed the patient's EKG which shows no evidence of ischemia and is generally reassuring.  I ordered a 500 mL normal saline fluid bolus to support her preload and for her syncopal episode. ? ?The patient is on the cardiac monitor to evaluate for evidence of arrhythmia and/or significant heart rate changes. ? ?Clinical Course as of 08/10/21 0758  ?Sun Aug 10, 2021  ?0503  Troponin I (High Sensitivity) ?High-sensitivity troponin is within normal limits [CF]  ?0612 I reviewed the patient's CTA chest and CT abdomen and pelvis and there are multiple abnormalities, very difficult to

## 2021-08-10 NOTE — Progress Notes (Signed)
PHARMACY -  BRIEF ANTIBIOTIC NOTE  ? ?Pharmacy has received consult(s) for Vancomycin from an ED provider.  The patient's profile has been reviewed for ht/wt/allergies/indication/available labs.   ? ?One time order(s) placed for Vancomycin '2000mg'$  IV  ? ?Further antibiotics/pharmacy consults should be ordered by admitting physician if indicated.       ?                ?Thank you, ?Gaven Eugene Rodriguez-Guzman PharmD, BCPS ?08/10/2021 6:40 AM ? ?

## 2021-08-10 NOTE — ED Triage Notes (Addendum)
Pt comes from home via [facility] with complaints of syncopal episode. Pt was in bed when this happens. Pt reports that she did not fall. She states she "just didn't feel good". Pt A&O x 4. EMS also states pt was satting 87% on RA, they put her on 3L and she then was satting 95%. Placed on 1L at this time o2 sat 98%. ?Hx of cervical cancer ?

## 2021-08-10 NOTE — ED Notes (Signed)
Pt ambulated to toilet and back to bed. 

## 2021-08-10 NOTE — ED Notes (Signed)
Pt ambulated to toilet to urinate and back to bed.  ?

## 2021-08-10 NOTE — ED Notes (Signed)
pt has a bed assignment at [facility] per Paula-coordinator, call report to [phone]. I will reach out to [facility] for transport ?

## 2021-08-10 NOTE — ED Notes (Signed)
Report has been called to Beckie Busing, Therapist, sports at [facility]. Patient's belongings have been collected and begged. She will be taking one bag with here that includes a jacket, shawl and pocketbook. Her husband has been notified that transport is on the way. ?

## 2021-08-10 NOTE — ED Notes (Signed)
Patient resting comfortably on stretcher in room. RR even and unlabored. Patient verbalizes no complaints. Patient requesting beverage which was provided to her and her husband. Husband remains at bedside. ?

## 2021-08-10 NOTE — ED Notes (Signed)
[facility] to transport pt to [facility] ?

## 2021-08-27 DIAGNOSIS — C7800 Secondary malignant neoplasm of unspecified lung: Secondary | ICD-10-CM | POA: Insufficient documentation

## 2021-09-01 ENCOUNTER — Other Ambulatory Visit: Payer: Self-pay | Admitting: *Deleted

## 2021-09-01 DIAGNOSIS — C530 Malignant neoplasm of endocervix: Secondary | ICD-10-CM

## 2021-09-05 ENCOUNTER — Encounter: Payer: Self-pay | Admitting: Oncology

## 2021-09-05 NOTE — Telephone Encounter (Signed)
Patient receiving care at [facility] ?

## 2021-09-15 NOTE — Telephone Encounter (Signed)
Error

## 2021-09-19 ENCOUNTER — Encounter: Payer: Self-pay | Admitting: Oncology

## 2021-09-19 NOTE — Telephone Encounter (Signed)
Opened in error

## 2021-09-23 ENCOUNTER — Other Ambulatory Visit: Payer: Self-pay | Admitting: *Deleted

## 2021-10-28 ENCOUNTER — Encounter: Payer: Self-pay | Admitting: Intensive Care

## 2021-10-28 ENCOUNTER — Inpatient Hospital Stay
Admission: EM | Admit: 2021-10-28 | Discharge: 2021-11-10 | DRG: 808 | Disposition: A | Discharge status: Home Health | Payer: BLUE CROSS/BLUE SHIELD | Attending: General Practice | Admitting: General Practice

## 2021-10-28 ENCOUNTER — Emergency Department: Payer: BLUE CROSS/BLUE SHIELD

## 2021-10-28 ENCOUNTER — Other Ambulatory Visit: Payer: Self-pay

## 2021-10-28 DIAGNOSIS — R9431 Abnormal electrocardiogram [ECG] [EKG]: Secondary | ICD-10-CM

## 2021-10-28 DIAGNOSIS — Z6827 Body mass index (BMI) 27.0-27.9, adult: Secondary | ICD-10-CM

## 2021-10-28 DIAGNOSIS — Z833 Family history of diabetes mellitus: Secondary | ICD-10-CM

## 2021-10-28 DIAGNOSIS — E440 Moderate protein-calorie malnutrition: Secondary | ICD-10-CM | POA: Diagnosis present

## 2021-10-28 DIAGNOSIS — Z79899 Other long term (current) drug therapy: Secondary | ICD-10-CM

## 2021-10-28 DIAGNOSIS — Z853 Personal history of malignant neoplasm of breast: Secondary | ICD-10-CM

## 2021-10-28 DIAGNOSIS — C771 Secondary and unspecified malignant neoplasm of intrathoracic lymph nodes: Secondary | ICD-10-CM | POA: Diagnosis present

## 2021-10-28 DIAGNOSIS — B377 Candidal sepsis: Secondary | ICD-10-CM | POA: Diagnosis not present

## 2021-10-28 DIAGNOSIS — R112 Nausea with vomiting, unspecified: Secondary | ICD-10-CM | POA: Diagnosis present

## 2021-10-28 DIAGNOSIS — Z9889 Other specified postprocedural states: Secondary | ICD-10-CM

## 2021-10-28 DIAGNOSIS — T451X5A Adverse effect of antineoplastic and immunosuppressive drugs, initial encounter: Secondary | ICD-10-CM

## 2021-10-28 DIAGNOSIS — N170 Acute kidney failure with tubular necrosis: Secondary | ICD-10-CM | POA: Diagnosis present

## 2021-10-28 DIAGNOSIS — N3001 Acute cystitis with hematuria: Secondary | ICD-10-CM | POA: Diagnosis present

## 2021-10-28 DIAGNOSIS — D61810 Antineoplastic chemotherapy induced pancytopenia: Secondary | ICD-10-CM | POA: Diagnosis present

## 2021-10-28 DIAGNOSIS — J3800 Paralysis of vocal cords and larynx, unspecified: Secondary | ICD-10-CM | POA: Diagnosis not present

## 2021-10-28 DIAGNOSIS — G935 Compression of brain: Secondary | ICD-10-CM | POA: Diagnosis present

## 2021-10-28 DIAGNOSIS — D6959 Other secondary thrombocytopenia: Secondary | ICD-10-CM | POA: Diagnosis present

## 2021-10-28 DIAGNOSIS — Z8616 Personal history of COVID-19: Secondary | ICD-10-CM

## 2021-10-28 DIAGNOSIS — K224 Dyskinesia of esophagus: Secondary | ICD-10-CM | POA: Diagnosis present

## 2021-10-28 DIAGNOSIS — Z821 Family history of blindness and visual loss: Secondary | ICD-10-CM

## 2021-10-28 DIAGNOSIS — C7801 Secondary malignant neoplasm of right lung: Secondary | ICD-10-CM | POA: Diagnosis present

## 2021-10-28 DIAGNOSIS — D701 Agranulocytosis secondary to cancer chemotherapy: Principal | ICD-10-CM

## 2021-10-28 DIAGNOSIS — I4581 Long QT syndrome: Secondary | ICD-10-CM | POA: Diagnosis not present

## 2021-10-28 DIAGNOSIS — R5081 Fever presenting with conditions classified elsewhere: Secondary | ICD-10-CM | POA: Diagnosis present

## 2021-10-28 DIAGNOSIS — F419 Anxiety disorder, unspecified: Secondary | ICD-10-CM | POA: Diagnosis present

## 2021-10-28 DIAGNOSIS — Y95 Nosocomial condition: Secondary | ICD-10-CM | POA: Diagnosis present

## 2021-10-28 DIAGNOSIS — R590 Localized enlarged lymph nodes: Secondary | ICD-10-CM | POA: Diagnosis present

## 2021-10-28 DIAGNOSIS — R8281 Pyuria: Secondary | ICD-10-CM | POA: Insufficient documentation

## 2021-10-28 DIAGNOSIS — I248 Other forms of acute ischemic heart disease: Secondary | ICD-10-CM | POA: Diagnosis present

## 2021-10-28 DIAGNOSIS — R930 Abnormal findings on diagnostic imaging of skull and head, not elsewhere classified: Secondary | ICD-10-CM | POA: Diagnosis not present

## 2021-10-28 DIAGNOSIS — C7802 Secondary malignant neoplasm of left lung: Secondary | ICD-10-CM | POA: Diagnosis present

## 2021-10-28 DIAGNOSIS — J189 Pneumonia, unspecified organism: Secondary | ICD-10-CM

## 2021-10-28 DIAGNOSIS — N179 Acute kidney failure, unspecified: Secondary | ICD-10-CM | POA: Diagnosis not present

## 2021-10-28 DIAGNOSIS — N390 Urinary tract infection, site not specified: Secondary | ICD-10-CM

## 2021-10-28 DIAGNOSIS — J3801 Paralysis of vocal cords and larynx, unilateral: Secondary | ICD-10-CM | POA: Diagnosis present

## 2021-10-28 DIAGNOSIS — W1830XA Fall on same level, unspecified, initial encounter: Secondary | ICD-10-CM | POA: Diagnosis present

## 2021-10-28 DIAGNOSIS — Z8541 Personal history of malignant neoplasm of cervix uteri: Secondary | ICD-10-CM | POA: Diagnosis not present

## 2021-10-28 DIAGNOSIS — Z515 Encounter for palliative care: Secondary | ICD-10-CM | POA: Diagnosis not present

## 2021-10-28 DIAGNOSIS — K219 Gastro-esophageal reflux disease without esophagitis: Secondary | ICD-10-CM | POA: Diagnosis present

## 2021-10-28 DIAGNOSIS — G893 Neoplasm related pain (acute) (chronic): Secondary | ICD-10-CM | POA: Diagnosis present

## 2021-10-28 DIAGNOSIS — J90 Pleural effusion, not elsewhere classified: Secondary | ICD-10-CM | POA: Diagnosis present

## 2021-10-28 DIAGNOSIS — B3789 Other sites of candidiasis: Secondary | ICD-10-CM | POA: Diagnosis present

## 2021-10-28 DIAGNOSIS — C530 Malignant neoplasm of endocervix: Secondary | ICD-10-CM

## 2021-10-28 DIAGNOSIS — Z7189 Other specified counseling: Secondary | ICD-10-CM | POA: Diagnosis not present

## 2021-10-28 DIAGNOSIS — R008 Other abnormalities of heart beat: Secondary | ICD-10-CM | POA: Diagnosis not present

## 2021-10-28 DIAGNOSIS — D709 Neutropenia, unspecified: Secondary | ICD-10-CM | POA: Diagnosis present

## 2021-10-28 DIAGNOSIS — Z1624 Resistance to multiple antibiotics: Secondary | ICD-10-CM | POA: Diagnosis present

## 2021-10-28 DIAGNOSIS — B9620 Unspecified Escherichia coli [E. coli] as the cause of diseases classified elsewhere: Secondary | ICD-10-CM | POA: Diagnosis present

## 2021-10-28 DIAGNOSIS — K2289 Other specified disease of esophagus: Secondary | ICD-10-CM | POA: Diagnosis present

## 2021-10-28 DIAGNOSIS — E860 Dehydration: Secondary | ICD-10-CM | POA: Diagnosis present

## 2021-10-28 DIAGNOSIS — E876 Hypokalemia: Secondary | ICD-10-CM | POA: Diagnosis present

## 2021-10-28 DIAGNOSIS — R54 Age-related physical debility: Secondary | ICD-10-CM | POA: Diagnosis present

## 2021-10-28 DIAGNOSIS — Z83511 Family history of glaucoma: Secondary | ICD-10-CM

## 2021-10-28 DIAGNOSIS — T80212A Local infection due to central venous catheter, initial encounter: Secondary | ICD-10-CM | POA: Diagnosis present

## 2021-10-28 DIAGNOSIS — R053 Chronic cough: Secondary | ICD-10-CM | POA: Diagnosis present

## 2021-10-28 DIAGNOSIS — Z20822 Contact with and (suspected) exposure to covid-19: Secondary | ICD-10-CM | POA: Diagnosis present

## 2021-10-28 LAB — URINALYSIS, ROUTINE W REFLEX MICROSCOPIC
Bilirubin Urine: NEGATIVE
Glucose, UA: NEGATIVE mg/dL
Hgb urine dipstick: NEGATIVE
Ketones, ur: NEGATIVE mg/dL
Nitrite: NEGATIVE
Protein, ur: 30 mg/dL — AB
Specific Gravity, Urine: 1.014 (ref 1.005–1.030)
pH: 6 (ref 5.0–8.0)

## 2021-10-28 LAB — CBC
HCT: 26.4 % — ABNORMAL LOW (ref 36.0–46.0)
Hemoglobin: 8.8 g/dL — ABNORMAL LOW (ref 12.0–15.0)
MCH: 30.8 pg (ref 26.0–34.0)
MCHC: 33.3 g/dL (ref 30.0–36.0)
MCV: 92.3 fL (ref 80.0–100.0)
Platelets: 59 10*3/uL — ABNORMAL LOW (ref 150–400)
RBC: 2.86 MIL/uL — ABNORMAL LOW (ref 3.87–5.11)
RDW: 14.8 % (ref 11.5–15.5)
WBC: 1 10*3/uL — CL (ref 4.0–10.5)
nRBC: 0 % (ref 0.0–0.2)

## 2021-10-28 LAB — CBC WITH DIFFERENTIAL/PLATELET
Abs Immature Granulocytes: 0.02 10*3/uL (ref 0.00–0.07)
Basophils Absolute: 0 10*3/uL (ref 0.0–0.1)
Basophils Relative: 0 %
Eosinophils Absolute: 0 10*3/uL (ref 0.0–0.5)
Eosinophils Relative: 1 %
HCT: 26.1 % — ABNORMAL LOW (ref 36.0–46.0)
Hemoglobin: 8.7 g/dL — ABNORMAL LOW (ref 12.0–15.0)
Immature Granulocytes: 2 %
Lymphocytes Relative: 70 %
Lymphs Abs: 0.7 10*3/uL (ref 0.7–4.0)
MCH: 31.1 pg (ref 26.0–34.0)
MCHC: 33.3 g/dL (ref 30.0–36.0)
MCV: 93.2 fL (ref 80.0–100.0)
Monocytes Absolute: 0.2 10*3/uL (ref 0.1–1.0)
Monocytes Relative: 22 %
Neutro Abs: 0.1 10*3/uL — CL (ref 1.7–7.7)
Neutrophils Relative %: 5 %
Platelets: 61 10*3/uL — ABNORMAL LOW (ref 150–400)
RBC: 2.8 MIL/uL — ABNORMAL LOW (ref 3.87–5.11)
RDW: 14.8 % (ref 11.5–15.5)
Smear Review: NORMAL
WBC: 1 10*3/uL — CL (ref 4.0–10.5)
nRBC: 0 % (ref 0.0–0.2)

## 2021-10-28 LAB — BASIC METABOLIC PANEL
Anion gap: 7 (ref 5–15)
BUN: 23 mg/dL — ABNORMAL HIGH (ref 6–20)
CO2: 28 mmol/L (ref 22–32)
Calcium: 8.7 mg/dL — ABNORMAL LOW (ref 8.9–10.3)
Chloride: 99 mmol/L (ref 98–111)
Creatinine, Ser: 1.1 mg/dL — ABNORMAL HIGH (ref 0.44–1.00)
GFR, Estimated: 58 mL/min — ABNORMAL LOW (ref >60)
Glucose, Bld: 107 mg/dL — ABNORMAL HIGH (ref 70–99)
Potassium: 3.6 mmol/L (ref 3.5–5.1)
Sodium: 134 mmol/L — ABNORMAL LOW (ref 135–145)

## 2021-10-28 LAB — PROCALCITONIN: Procalcitonin: <0.1 ng/mL

## 2021-10-28 LAB — HEPATIC FUNCTION PANEL
ALT: 42 U/L (ref 0–44)
AST: 30 U/L (ref 15–41)
Albumin: 3.3 g/dL — ABNORMAL LOW (ref 3.5–5.0)
Alkaline Phosphatase: 68 U/L (ref 38–126)
Bilirubin, Direct: 0.2 mg/dL (ref 0.0–0.2)
Indirect Bilirubin: 0.6 mg/dL (ref 0.3–0.9)
Total Bilirubin: 0.8 mg/dL (ref 0.3–1.2)
Total Protein: 6.4 g/dL — ABNORMAL LOW (ref 6.5–8.1)

## 2021-10-28 LAB — LACTIC ACID, PLASMA
Lactic Acid, Venous: 1.3 mmol/L (ref 0.5–1.9)
Lactic Acid, Venous: 0.7 mmol/L (ref 0.5–1.9)

## 2021-10-28 LAB — SARS CORONAVIRUS 2 BY RT PCR: SARS Coronavirus 2 by RT PCR: NEGATIVE

## 2021-10-28 LAB — LIPASE, BLOOD: Lipase: 23 U/L (ref 11–51)

## 2021-10-28 MED ORDER — PROMETHAZINE HCL 25 MG/ML IJ SOLN
12.5000 mg | Freq: Four times a day (QID) | INTRAMUSCULAR | Status: DC | PRN
Start: 2021-10-28 — End: 2021-11-10
  Administered 2021-10-29 – 2021-11-09 (×11): 12.5 mg via INTRAVENOUS
  Filled 2021-10-28 (×4): qty 12.5
  Filled 2021-10-28: qty 0.5
  Filled 2021-10-28: qty 12.5
  Filled 2021-10-28 (×2): qty 0.5
  Filled 2021-10-28 (×3): qty 12.5
  Filled 2021-10-28: qty 0.5

## 2021-10-28 MED ORDER — SODIUM CHLORIDE 0.9 % IV BOLUS
1000.0000 mL | Freq: Once | INTRAVENOUS | Status: AC
  Administered 2021-10-28: 1000 mL via INTRAVENOUS

## 2021-10-28 MED ORDER — SODIUM CHLORIDE 0.9 % IV SOLN
1.0000 g | Freq: Three times a day (TID) | INTRAVENOUS | Status: DC
  Administered 2021-10-28 – 2021-10-29 (×3): 1 g via INTRAVENOUS
  Filled 2021-10-28 (×2): qty 1
  Filled 2021-10-28 (×2): qty 20

## 2021-10-28 MED ORDER — CEFEPIME HCL 2 G IV SOLR
2.0000 g | Freq: Once | INTRAVENOUS | Status: AC
  Administered 2021-10-28: 2 g via INTRAVENOUS
  Filled 2021-10-28: qty 12.5

## 2021-10-28 MED ORDER — ONDANSETRON HCL 4 MG PO TABS
4.0000 mg | ORAL_TABLET | Freq: Four times a day (QID) | ORAL | Status: DC | PRN
  Filled 2021-10-28: qty 1

## 2021-10-28 MED ORDER — VANCOMYCIN HCL 1750 MG/350ML IV SOLN
1750.0000 mg | Freq: Once | INTRAVENOUS | Status: AC
  Administered 2021-10-28: 1750 mg via INTRAVENOUS
  Filled 2021-10-28: qty 350

## 2021-10-28 MED ORDER — VANCOMYCIN HCL 750 MG/150ML IV SOLN: 750.0000 mg | INTRAVENOUS | Status: DC

## 2021-10-28 MED ORDER — ACETAMINOPHEN 325 MG PO TABS
650.0000 mg | ORAL_TABLET | Freq: Four times a day (QID) | ORAL | Status: DC | PRN
  Administered 2021-10-29: 650 mg via ORAL
  Filled 2021-10-28: qty 2

## 2021-10-28 MED ORDER — SODIUM CHLORIDE 0.9 % IV SOLN: 1.0000 g | Freq: Once | INTRAVENOUS | Status: DC

## 2021-10-28 MED ORDER — ONDANSETRON HCL 4 MG/2ML IJ SOLN
4.0000 mg | Freq: Four times a day (QID) | INTRAMUSCULAR | Status: DC | PRN
  Administered 2021-10-28 – 2021-11-10 (×25): 4 mg via INTRAVENOUS
  Filled 2021-10-28 (×25): qty 2

## 2021-10-28 MED ORDER — MORPHINE SULFATE (PF) 2 MG/ML IV SOLN
2.0000 mg | INTRAVENOUS | Status: DC | PRN
  Administered 2021-10-30 – 2021-11-09 (×12): 2 mg via INTRAVENOUS
  Filled 2021-10-28 (×13): qty 1

## 2021-10-28 MED ORDER — ONDANSETRON 4 MG PO TBDP
4.0000 mg | ORAL_TABLET | Freq: Once | ORAL | Status: AC | PRN
  Administered 2021-10-28: 4 mg via ORAL
  Filled 2021-10-28: qty 1

## 2021-10-28 MED ORDER — ACETAMINOPHEN 650 MG RE SUPP: 650.0000 mg | Freq: Four times a day (QID) | RECTAL | Status: DC | PRN

## 2021-10-28 MED ORDER — LACTATED RINGERS IV SOLN: INTRAVENOUS | Status: DC

## 2021-10-28 MED ORDER — SCOPOLAMINE 1 MG/3DAYS TD PT72
1.0000 | MEDICATED_PATCH | TRANSDERMAL | Status: DC
  Administered 2021-10-28 – 2021-11-09 (×5): 1.5 mg via TRANSDERMAL
  Filled 2021-10-28 (×6): qty 1

## 2021-10-28 NOTE — H&P (Signed)
History and Physical    Patient: Jill Rodriguez:[id number] DOB: 28-Feb-1961 DOA: 10/28/2021 DOS: the patient was seen and examined on 10/28/2021 PCP: Pcp, No  Patient coming from: Home  Chief Complaint:  Chief Complaint  Patient presents with   Nausea   Emesis    HPI: Jill Rodriguez is a 61 y.o. 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 coli UTI sensitive only to ertapenem and nitrofurantoin, with last chemotherapy infusion on 6/23 who presents to the ED with intractable nausea and vomiting that started 2 days prior.  During her last chemotherapy session she developed acute nausea and vomiting that was treated with Compazine.  Her nausea and vomiting was managed for the first few days with dexamethasone, Marinol, Zofran, Phenergan suppository and scopolamine patch, however 2 days ago she developed intractable vomiting associated with weakness.  According to her husband at the bedside, patient has been having bouts of coughing for the past several weeks and now every time she has a bout of coughing she vomits.  He feels like this happens every 4-6 hours over the past two days.  She has not had shortness of breath, chest pain or fever. ED course and data review: Tmax 99.8, pulse 113, respirations 22 with BP 158/78 and O2 sat 97% on room air. Labs: WBC 1.0, down from 4 on 6/23.  ANC around 100.  Hemoglobin 8.7 and platelets 61,000.  Creatinine 1.10 up from baseline of 0.68.  COVID-negative.  Lactic acid 0.7.  Lipase 23.  Procalcitonin less than 0.10. Urinalysis with small leukocyte esterase and many bacteria. CT head nonacute.  Chest x-ray shows dense left basilar opacification may reflect atelectasis, consolidation  and/or effusion  Patient started on cefepime for possible UTI.  Hospitalist consulted for admission.    Past Medical History:  Diagnosis Date   Cancer ([place])    cervical CA, that spread to lymph nodes; last chemo was 12/08/2019; radiation 01/2020   COVID-19 10/2018   Nausea with vomiting 10/31/2019   Neoplasm related pain    No pertinent past medical history    Past Surgical History:  Procedure Laterality Date   ESOPHAGOGASTRODUODENOSCOPY (EGD) WITH PROPOFOL N/A 03/28/2020   Procedure: ESOPHAGOGASTRODUODENOSCOPY (EGD) WITH PROPOFOL;  Surgeon: Milus Banister, MD;  Location: [facility] ENDOSCOPY;  Service: Endoscopy;  Laterality: N/A;   EUS N/A 03/28/2020   Procedure: UPPER ENDOSCOPIC ULTRASOUND (EUS) RADIAL;  Surgeon: Milus Banister, MD;  Location: [facility] ENDOSCOPY;  Service: Endoscopy;  Laterality: N/A;   no surgical history     Social History:  reports that she has never smoked. She has never used smokeless tobacco. She reports that she does not drink alcohol and does not use drugs.  No Known Allergies  Family History  Problem Relation Age of Onset   Blindness Mother    Glaucoma Mother    Diabetes Father    Diabetes Brother     Prior to Admission medications   Medication Sig Start Date End Date Taking? Authorizing Provider  dexamethasone (DECADRON) 4 MG tablet Take 4 mg by mouth as directed. Take 1 tablet ('4mg'$ ) 6/17-6/18 After chemo, take 2 tablets ('8mg'$ ) on days 2-4 then 1 tablet (4 mg) on days 5-7 10/25/21  Yes [provider]  dronabinol (MARINOL) 2.5 MG  capsule Take 2.5 mg by mouth daily. 09/30/21  Yes [provider]  LORazepam (ATIVAN) 0.5 MG tablet Take 0.5 mg by mouth every 8 (eight) hours as needed. 10/27/21  Yes [provider]  Multiple Vitamins-Minerals (THERA-M) TABS Take 1 tablet by mouth daily. 10/10/21  Yes [provider]  OLANZapine (ZYPREXA) 5 MG tablet Take 5 mg by mouth at bedtime. 10/25/21  Yes [provider]  ondansetron  (ZOFRAN) 8 MG tablet Take 8 mg by mouth 3 (three) times daily. 10/17/21  Yes [provider]  scopolamine (TRANSDERM-SCOP) 1 MG/3DAYS 1 patch every 3 (three) days. 10/10/21  Yes [provider]  apixaban (ELIQUIS) 5 MG TABS tablet Take by mouth. Patient not taking: Reported on 10/28/2021 08/12/21   [provider]  nitrofurantoin, macrocrystal-monohydrate, (MACROBID) 100 MG capsule Take 1 capsule (100 mg total) by mouth 2 (two) times daily. Patient not taking: Reported on 10/28/2021 07/23/21   Borders, Kirt Boys, NP  prochlorperazine (COMPAZINE) 10 MG tablet Take 1 tablet (10 mg total) by mouth every 6 (six) hours as needed (Nausea or vomiting). 07/25/21   Earlie Server, MD  promethazine (PHENERGAN) 25 MG suppository Place 1 suppository (25 mg total) rectally every 8 (eight) hours as needed for nausea, vomiting or refractory nausea / vomiting. 07/25/21   Earlie Server, MD  traZODone (DESYREL) 50 MG tablet Take 1 tablet (50 mg total) by mouth at bedtime as needed for sleep. 07/25/21   Earlie Server, MD    Physical Exam: Vitals:   10/28/21 1948 10/28/21 2000 10/28/21 2100 10/28/21 2130  BP:  134/81 137/71 (!) 145/70  Pulse:  95 97 (!) 102  Resp:  18 (!) 22 18  Temp: 99.8 F (37.7 C)     TempSrc: Rectal     SpO2:  97% 93% 96%  Weight:       Physical Exam Vitals and nursing note reviewed.  Constitutional:      General: She is not in acute distress.    Appearance: She is ill-appearing.  HENT:     Head: Normocephalic and atraumatic.  Cardiovascular:     Rate and Rhythm: Normal rate and regular rhythm.     Heart sounds: Normal heart sounds.  Pulmonary:     Effort: Tachypnea present.     Breath sounds: Normal breath sounds.  Abdominal:     Palpations: Abdomen is soft.     Tenderness: There is no abdominal tenderness.  Neurological:     Mental Status: Mental status is at baseline. She is lethargic.     Labs on Admission: I have personally reviewed following labs and imaging  studies  CBC: Recent Labs  Lab 10/28/21 1531  WBC 1.0*  1.0*  NEUTROABS 0.1*  HGB 8.7*  8.8*  HCT 26.1*  26.4*  MCV 93.2  92.3  PLT 61*  59*   Basic Metabolic Panel: Recent Labs  Lab 10/28/21 1531  NA 134*  K 3.6  CL 99  CO2 28  GLUCOSE 107*  BUN 23*  CREATININE 1.10*  CALCIUM 8.7*   GFR: Estimated Creatinine Clearance: 51.1 mL/min (A) (by C-G formula based on SCr of 1.1 mg/dL (H)). Liver Function Tests: Recent Labs  Lab 10/28/21 1531  AST 30  ALT 42  ALKPHOS 68  BILITOT 0.8  PROT 6.4*  ALBUMIN 3.3*   Recent Labs  Lab 10/28/21 1531  LIPASE 23   No results for input(s): "AMMONIA" in the last 168 hours. Coagulation Profile: No results for input(s): "INR", "PROTIME" in  the last 168 hours. Cardiac Enzymes: No results for input(s): "CKTOTAL", "CKMB", "CKMBINDEX", "TROPONINI" in the last 168 hours. BNP (last 3 results) No results for input(s): "PROBNP" in the last 8760 hours. HbA1C: No results for input(s): "HGBA1C" in the last 72 hours. CBG: No results for input(s): "GLUCAP" in the last 168 hours. Lipid Profile: No results for input(s): "CHOL", "HDL", "LDLCALC", "TRIG", "CHOLHDL", "LDLDIRECT" in the last 72 hours. Thyroid Function Tests: No results for input(s): "TSH", "T4TOTAL", "FREET4", "T3FREE", "THYROIDAB" in the last 72 hours. Anemia Panel: No results for input(s): "VITAMINB12", "FOLATE", "FERRITIN", "TIBC", "IRON", "RETICCTPCT" in the last 72 hours. Urine analysis:    Component Value Date/Time   COLORURINE AMBER (A) 10/28/2021 1531   APPEARANCEUR CLOUDY (A) 10/28/2021 1531   LABSPEC 1.014 10/28/2021 1531   PHURINE 6.0 10/28/2021 1531   GLUCOSEU NEGATIVE 10/28/2021 1531   HGBUR NEGATIVE 10/28/2021 1531   BILIRUBINUR NEGATIVE 10/28/2021 1531   KETONESUR NEGATIVE 10/28/2021 1531   PROTEINUR 30 (A) 10/28/2021 1531   NITRITE NEGATIVE 10/28/2021 1531   LEUKOCYTESUR SMALL (A) 10/28/2021 1531    Radiological Exams on Admission: DG Chest  Portable 1 View  Result Date: 10/28/2021 CLINICAL DATA:  Cough EXAM: PORTABLE CHEST 1 VIEW COMPARISON:  08/10/2021 FINDINGS: Interval placement of a right-sided chest port with distal tip terminating near the level of the superior cavoatrial junction. Low lung volumes with chronic elevation of the left hemidiaphragm. Heart size and mediastinal contours are largely obscured. Dense left basilar opacification may reflect atelectasis, consolidation, and or effusion. No pneumothorax. IMPRESSION: Low lung volumes with chronic elevation of the left hemidiaphragm. Dense left basilar opacification may reflect atelectasis, consolidation, and/or effusion. Electronically Signed   By: Davina Poke D.O.   On: 10/28/2021 16:42   CT HEAD WO CONTRAST  Result Date: 10/28/2021 CLINICAL DATA:  61 year old female with headache following injury. Initial encounter. EXAM: CT HEAD WITHOUT CONTRAST TECHNIQUE: Contiguous axial images were obtained from the base of the skull through the vertex without intravenous contrast. RADIATION DOSE REDUCTION: This exam was performed according to the departmental dose-optimization program which includes automated exposure control, adjustment of the mA and/or kV according to patient size and/or use of iterative reconstruction technique. COMPARISON:  None Available. FINDINGS: Brain: No evidence of acute infarction, hemorrhage, hydrocephalus, extra-axial collection or mass lesion/mass effect. The cerebellar tonsils appear low lying. Vascular: Carotid and vertebral atherosclerotic calcifications are noted. Skull: Normal. Negative for fracture or focal lesion. Sinuses/Orbits: No acute finding. Other: None. IMPRESSION: 1. No evidence of acute intracranial abnormality. 2. 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 Consider MRI for further evaluation as indicated. Electronically Signed   By: Margarette Canada M.D.   On: 10/28/2021 16:03     Data Reviewed: Relevant notes from primary care  and specialist visits, past discharge summaries as available in EHR, including Care Everywhere. Prior diagnostic testing as pertinent to current admission diagnoses Updated medications and problem lists for reconciliation ED course, including vitals, labs, imaging, treatment and response to treatment Triage notes, nursing and pharmacy notes and ED provider's notes Notable results as noted in HPI   Assessment and Plan: * Chemotherapy induced nausea and vomiting Continue scopolamine IV Phenergan as needed Consult oncology in the a.m.  Neutropenic fever (HCC) UTI Possible HCAP Tmax of 99.8 in the ED with WBC 1000 for ANC of 70 Suspecting possible UTI and pneumonia  urinalysis consistent with UTI.  History of E. 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 for recommendations Neutropenic precautions SCDs for DVT prophylaxis         DVT prophylaxis: SCD  Consults: none  Advance Care Planning:   Code Status: Not on file full  Family Communication: husband at bedside  Disposition Plan: Back to previous home environment  Severity of Illness: The appropriate patient status for this patient is INPATIENT. Inpatient status is judged to be reasonable and necessary in order to provide the required intensity of service to ensure the patient's safety. The patient's presenting symptoms, physical exam  findings, and initial radiographic and laboratory data in the context of their chronic comorbidities is felt to place them at high risk for further clinical deterioration. Furthermore, it is not anticipated that the patient will be medically stable for discharge from the hospital within 2 midnights of admission.   * I certify that at the point of admission it is my clinical judgment that the patient will require inpatient hospital care spanning beyond 2 midnights from the point of admission due to high intensity of service, high risk for further deterioration and high frequency of surveillance required.*  Author: Athena Masse, MD 10/28/2021 9:53 PM  For on call review [email or web address].

## 2021-10-28 NOTE — ED Notes (Signed)
Pt placed on a pure wick at this time and boosted

## 2021-10-28 NOTE — Assessment & Plan Note (Signed)
EKG for qt eval (460s) Continue scopolamine zofran and phenergan as needed Consult oncology in the Jill Rodriguez.m.

## 2021-10-28 NOTE — Assessment & Plan Note (Signed)
Meropenem based on past sensitivities Follow cultures

## 2021-10-28 NOTE — Assessment & Plan Note (Addendum)
UTI Possible HCAP Tmax of 99.8 in the ED with WBC 1000 for ANC of 70 Suspecting possible UTI and pneumonia  urinalysis consistent with UTI.  History of E. coli 6/10 sensitive only to carbapenems and nitrofurantoin Patient is symptomatic for cough and chest x-ray concerning for possible infiltrate Meropenem and vancomycin Follow cultures Consider ID consult

## 2021-10-28 NOTE — Assessment & Plan Note (Signed)
Improved with IVF, follow

## 2021-10-28 NOTE — ED Triage Notes (Addendum)
Interpretor on stick used during triage. Patient presents with nausea, emesis and cough since yesterday. Patient has chest port. Last treatment of chemo was 10/17/21 for unknown cancer. Husband also reports two days ago she slipped in the shower and hit the back of her head

## 2021-10-28 NOTE — Assessment & Plan Note (Addendum)
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 for recommendations Neutropenic precautions SCDs for DVT prophylaxis

## 2021-10-28 NOTE — ED Provider Notes (Signed)
[facility] Provider Note    Event Date/Time   First MD Initiated Contact with Patient 10/28/21 1820     (approximate)   History   Nausea and Emesis   HPI  Tinsley Everman is a 61 y.o. female starting chemotherapy for cervical cancer presents to the ER for evaluation of 24 to 36 hours of generalized malaise chills nausea vomiting poor p.o. intake.  Has had some increased urinary frequency.  Denies any back pain no headache.  Has had some nonproductive cough.  Did have mechanical fall because she was feeling lightheaded yesterday evening.  He denies any other associated injury.     Physical Exam   Triage Vital Signs: ED Triage Vitals  Enc Vitals Group     BP 10/28/21 1528 124/84     Pulse Rate 10/28/21 1528 (!) 113     Resp 10/28/21 1528 16     Temp 10/28/21 1528 98.5 F (36.9 C)     Temp Source 10/28/21 1528 Oral     SpO2 10/28/21 1528 97 %     Weight 10/28/21 1529 170 lb (77.1 kg)     Height --      Head Circumference --      Peak Flow --      Pain Score 10/28/21 1528 7     Pain Loc --      Pain Edu? --      Excl. in [place]? --     Most recent vital signs: Vitals:   10/28/21 2100 10/28/21 2130  BP: 137/71 (!) 145/70  Pulse: 97 (!) 102  Resp: (!) 22 18  Temp:    SpO2: 93% 96%     Constitutional: Alert but ill and frail appearing Eyes: Conjunctivae are normal.  Head: Atraumatic. Nose: No congestion/rhinnorhea. Mouth/Throat: Mucous membranes are moist.   Neck: Painless ROM.  Cardiovascular:   Good peripheral circulation. Respiratory: Normal respiratory effort.  No retractions.  Gastrointestinal: Soft and nontender.  Musculoskeletal:  no deformity Neurologic:  MAE spontaneously. No gross focal neurologic deficits are appreciated.  Skin:  Skin is warm, dry and intact. No rash noted. Psychiatric: Mood and affect are normal. Speech and behavior are normal.    ED Results / Procedures / Treatments   Labs (all labs ordered are  listed, but only abnormal results are displayed) Labs Reviewed  BASIC METABOLIC PANEL - Abnormal; Notable for the following components:      Result Value   Sodium 134 (*)    Glucose, Bld 107 (*)    BUN 23 (*)    Creatinine, Ser 1.10 (*)    Calcium 8.7 (*)    GFR, Estimated 58 (*)    All other components within normal limits  CBC - Abnormal; Notable for the following components:   WBC 1.0 (*)    RBC 2.86 (*)    Hemoglobin 8.8 (*)    HCT 26.4 (*)    Platelets 59 (*)    All other components within normal limits  URINALYSIS, ROUTINE W REFLEX MICROSCOPIC - Abnormal; Notable for the following components:   Color, Urine AMBER (*)    APPearance CLOUDY (*)    Protein, ur 30 (*)    Leukocytes,Ua SMALL (*)    Bacteria, UA MANY (*)    All other components within normal limits  HEPATIC FUNCTION PANEL - Abnormal; Notable for the following components:   Total Protein 6.4 (*)    Albumin 3.3 (*)    All other components within  normal limits  CBC WITH DIFFERENTIAL/PLATELET - Abnormal; Notable for the following components:   WBC 1.0 (*)    RBC 2.80 (*)    Hemoglobin 8.7 (*)    HCT 26.1 (*)    Platelets 61 (*)    Neutro Abs 0.1 (*)    All other components within normal limits  SARS CORONAVIRUS 2 BY RT PCR  CULTURE, BLOOD (ROUTINE X 2)  CULTURE, BLOOD (ROUTINE X 2)  LACTIC ACID, PLASMA  LACTIC ACID, PLASMA  LIPASE, BLOOD  PROCALCITONIN  HIV ANTIBODY (ROUTINE TESTING W REFLEX)  LEGIONELLA PNEUMOPHILA SEROGP 1 UR AG  STREP PNEUMONIAE URINARY ANTIGEN  BASIC METABOLIC PANEL  CBC     EKG     RADIOLOGY Please see ED Course for my review and interpretation.  I personally reviewed all radiographic images ordered to evaluate for the above acute complaints and reviewed radiology reports and findings.  These findings were personally discussed with the patient.  Please see medical record for radiology report.    PROCEDURES:  Critical Care performed: No  Procedures   MEDICATIONS  ORDERED IN ED: Medications  scopolamine (TRANSDERM-SCOP) 1 MG/3DAYS 1.5 mg (has no administration in time range)  promethazine (PHENERGAN) 12.5 mg in sodium chloride 0.9 % 50 mL IVPB (has no administration in time range)  acetaminophen (TYLENOL) tablet 650 mg (has no administration in time range)    Or  acetaminophen (TYLENOL) suppository 650 mg (has no administration in time range)  ondansetron (ZOFRAN) tablet 4 mg (has no administration in time range)    Or  ondansetron (ZOFRAN) injection 4 mg (has no administration in time range)  lactated ringers infusion (has no administration in time range)  morphine (PF) 2 MG/ML injection 2 mg (has no administration in time range)  ondansetron (ZOFRAN-ODT) disintegrating tablet 4 mg (4 mg Oral Given 10/28/21 1540)  sodium chloride 0.9 % bolus 1,000 mL (1,000 mLs Intravenous New Bag/Given 10/28/21 1853)  ceFEPIme (MAXIPIME) 2 g in sodium chloride 0.9 % 100 mL IVPB (0 g Intravenous Stopped 10/28/21 2151)     IMPRESSION / MDM / ASSESSMENT AND PLAN / ED COURSE  I reviewed the triage vital signs and the nursing notes.                              Differential diagnosis includes, but is not limited to, sepsis, neutropenic fever, UTI, pneumonia, dehydration, medication effect, electrolyte abnormality  Patient presented to the ER for evaluation of symptoms as described above.  Patient frail-appearing protecting her airway not hypoxic low-grade temperature.  This presenting complaint could reflect a potentially life-threatening illness therefore the patient will be placed on continuous pulse oximetry and telemetry for monitoring.  Laboratory evaluation will be sent to evaluate for the above complaints.  We will give IV fluids we will do chest x-ray.  Blood was sent triage will add on differential as she is leukopenic and I am concerned she may be neutropenic.   Clinical Course as of 10/28/21 2228  Tue Oct 28, 2021  2009 Called lab due to delay in results from  her differential.  There is still pending but given her leukocytopenia and low-grade temperature with UTI I am going to go ahead and empirically cover with antibiotics given concern for infectious process.  Her procalcitonin is normal her lactate is normal which is reassuring but if she is neutropenic given these findings I suspect she may need hospitalization. [PR]  2049 Patient is  neutropenic.  I have ordered cefepime.  Will consult hospitalist for admission. [PR]    Clinical Course User Index [PR] Merlyn Lot, MD     FINAL CLINICAL IMPRESSION(S) / ED DIAGNOSES   Final diagnoses:  Acute cystitis with hematuria  Chemotherapy-induced neutropenia ([place])     Rx / DC Orders   ED Discharge Orders     None        Note:  This document was prepared using Dragon voice recognition software and may include unintentional dictation errors.    Merlyn Lot, MD 10/28/21 2228

## 2021-10-28 NOTE — Progress Notes (Signed)
Pharmacy Antibiotic Note  Jill Rodriguez is a 61 y.o. female admitted on 10/28/2021 with  febrile neutropenia .  Pharmacy has been consulted for Vanc, meropenem dosing.  Plan: Meropenem 1 gm IV Q8H ordered to start on 7/4 @ 2300.  Vancomycin 1750 mg IV X 1 ordered for 7/4 @ 2300. Vancomycin 750 mg IV Q24H ordered to start on 7/4 @ ~ 2300.  AUC = 505.8 Vanc trough = 13.3   Weight: 77.1 kg (170 lb)  Temp (24hrs), Avg:99.1 F (37.3 C), Min:98.5 F (36.9 C), Max:99.8 F (37.7 C)  Recent Labs  Lab 10/28/21 1531 10/28/21 1851 10/28/21 2025  WBC 1.0*  1.0*  --   --   CREATININE 1.10*  --   --   LATICACIDVEN  --  1.3 0.7    Estimated Creatinine Clearance: 51.1 mL/min (A) (by C-G formula based on SCr of 1.1 mg/dL (H)).    No Known Allergies  Antimicrobials this admission:   >>    >>   Dose adjustments this admission:   Microbiology results:  BCx:   UCx:    Sputum:    MRSA PCR:   Thank you for allowing pharmacy to be a part of this patient's care.  Melodie Ashworth D 10/28/2021 10:36 PM

## 2021-10-29 ENCOUNTER — Inpatient Hospital Stay (HOSPITAL_COMMUNITY)
Admit: 2021-10-29 | Discharge: 2021-10-29 | Disposition: A | Discharge status: Home / Self Care | Payer: BLUE CROSS/BLUE SHIELD | Attending: Family Medicine | Admitting: Family Medicine

## 2021-10-29 ENCOUNTER — Inpatient Hospital Stay: Payer: BLUE CROSS/BLUE SHIELD

## 2021-10-29 DIAGNOSIS — R112 Nausea with vomiting, unspecified: Secondary | ICD-10-CM | POA: Diagnosis not present

## 2021-10-29 DIAGNOSIS — T451X5A Adverse effect of antineoplastic and immunosuppressive drugs, initial encounter: Secondary | ICD-10-CM | POA: Diagnosis not present

## 2021-10-29 DIAGNOSIS — R008 Other abnormalities of heart beat: Secondary | ICD-10-CM

## 2021-10-29 DIAGNOSIS — R9431 Abnormal electrocardiogram [ECG] [EKG]: Secondary | ICD-10-CM

## 2021-10-29 DIAGNOSIS — R930 Abnormal findings on diagnostic imaging of skull and head, not elsewhere classified: Secondary | ICD-10-CM

## 2021-10-29 DIAGNOSIS — B377 Candidal sepsis: Secondary | ICD-10-CM

## 2021-10-29 LAB — CBC WITH DIFFERENTIAL/PLATELET
Abs Immature Granulocytes: 0 10*3/uL (ref 0.00–0.07)
Basophils Absolute: 0 10*3/uL (ref 0.0–0.1)
Basophils Relative: 0 %
Eosinophils Absolute: 0 10*3/uL (ref 0.0–0.5)
Eosinophils Relative: 1 %
HCT: 23.3 % — ABNORMAL LOW (ref 36.0–46.0)
Hemoglobin: 7.8 g/dL — ABNORMAL LOW (ref 12.0–15.0)
Immature Granulocytes: 0 %
Lymphocytes Relative: 53 %
Lymphs Abs: 0.6 10*3/uL — ABNORMAL LOW (ref 0.7–4.0)
MCH: 31.1 pg (ref 26.0–34.0)
MCHC: 33.5 g/dL (ref 30.0–36.0)
MCV: 92.8 fL (ref 80.0–100.0)
Monocytes Absolute: 0.3 10*3/uL (ref 0.1–1.0)
Monocytes Relative: 26 %
Neutro Abs: 0.2 10*3/uL — CL (ref 1.7–7.7)
Neutrophils Relative %: 20 %
Platelets: 50 10*3/uL — ABNORMAL LOW (ref 150–400)
RBC: 2.51 MIL/uL — ABNORMAL LOW (ref 3.87–5.11)
RDW: 14.8 % (ref 11.5–15.5)
Smear Review: NORMAL
WBC: 1 10*3/uL — CL (ref 4.0–10.5)
nRBC: 0 % (ref 0.0–0.2)

## 2021-10-29 LAB — BLOOD CULTURE ID PANEL (REFLEXED) - BCID2

## 2021-10-29 LAB — BASIC METABOLIC PANEL
Anion gap: 5 (ref 5–15)
BUN: 18 mg/dL (ref 6–20)
CO2: 28 mmol/L (ref 22–32)
Calcium: 8.4 mg/dL — ABNORMAL LOW (ref 8.9–10.3)
Chloride: 104 mmol/L (ref 98–111)
Creatinine, Ser: 0.85 mg/dL (ref 0.44–1.00)
GFR, Estimated: >60 mL/min (ref >60)
Glucose, Bld: 90 mg/dL (ref 70–99)
Potassium: 3.4 mmol/L — ABNORMAL LOW (ref 3.5–5.1)
Sodium: 137 mmol/L (ref 135–145)

## 2021-10-29 LAB — CBC
HCT: 23.6 % — ABNORMAL LOW (ref 36.0–46.0)
Hemoglobin: 7.9 g/dL — ABNORMAL LOW (ref 12.0–15.0)
MCH: 31.2 pg (ref 26.0–34.0)
MCHC: 33.5 g/dL (ref 30.0–36.0)
MCV: 93.3 fL (ref 80.0–100.0)
Platelets: 47 10*3/uL — ABNORMAL LOW (ref 150–400)
RBC: 2.53 MIL/uL — ABNORMAL LOW (ref 3.87–5.11)
RDW: 14.6 % (ref 11.5–15.5)
WBC: 1 10*3/uL — CL (ref 4.0–10.5)
nRBC: 0 % (ref 0.0–0.2)

## 2021-10-29 LAB — ECHOCARDIOGRAM COMPLETE
AR max vel: 2.26 cm2
AV Area VTI: 2.27 cm2
AV Area mean vel: 2.33 cm2
AV Mean grad: 3 mmHg
AV Peak grad: 6.6 mmHg
Ao pk vel: 1.28 m/s
Area-P 1/2: 10.18 cm2
Height: 64 in
S' Lateral: 2.51 cm
Weight: 2574.97 oz

## 2021-10-29 LAB — TROPONIN I (HIGH SENSITIVITY)
Troponin I (High Sensitivity): 18 ng/L — ABNORMAL HIGH (ref <18)
Troponin I (High Sensitivity): 19 ng/L — ABNORMAL HIGH (ref <18)

## 2021-10-29 LAB — MRSA NEXT GEN BY PCR, NASAL: MRSA by PCR Next Gen: NOT DETECTED

## 2021-10-29 LAB — HIV ANTIBODY (ROUTINE TESTING W REFLEX): HIV Screen 4th Generation wRfx: NONREACTIVE

## 2021-10-29 LAB — STREP PNEUMONIAE URINARY ANTIGEN: Strep Pneumo Urinary Antigen: NEGATIVE

## 2021-10-29 MED ORDER — VANCOMYCIN HCL 750 MG/150ML IV SOLN
750.0000 mg | Freq: Two times a day (BID) | INTRAVENOUS | Status: DC
  Administered 2021-10-29: 750 mg via INTRAVENOUS
  Filled 2021-10-29: qty 150

## 2021-10-29 MED ORDER — FLUCONAZOLE IN SODIUM CHLORIDE 400-0.9 MG/200ML-% IV SOLN
400.0000 mg | Freq: Once | INTRAVENOUS | Status: AC
  Administered 2021-10-29: 400 mg via INTRAVENOUS
  Filled 2021-10-29: qty 200

## 2021-10-29 MED ORDER — FLUCONAZOLE IN SODIUM CHLORIDE 400-0.9 MG/200ML-% IV SOLN
400.0000 mg | INTRAVENOUS | Status: DC
  Administered 2021-10-30 – 2021-11-10 (×12): 400 mg via INTRAVENOUS
  Filled 2021-10-29 (×13): qty 200

## 2021-10-29 MED ORDER — BOOST / RESOURCE BREEZE PO LIQD CUSTOM
1.0000 | Freq: Three times a day (TID) | ORAL | Status: DC
  Administered 2021-10-29 – 2021-11-03 (×11): 1 via ORAL

## 2021-10-29 MED ORDER — GUAIFENESIN-DM 100-10 MG/5ML PO SYRP
5.0000 mL | ORAL_SOLUTION | ORAL | Status: DC | PRN
  Administered 2021-10-29: 5 mL via ORAL
  Filled 2021-10-29 (×2): qty 10

## 2021-10-29 MED ORDER — SODIUM CHLORIDE 0.9 % IV SOLN: 100.0000 mg | INTRAVENOUS | Status: DC

## 2021-10-29 MED ORDER — ORAL CARE MOUTH RINSE
15.0000 mL | OROMUCOSAL | Status: DC
  Administered 2021-10-29 – 2021-11-10 (×33): 15 mL via OROMUCOSAL

## 2021-10-29 MED ORDER — ADULT MULTIVITAMIN W/MINERALS CH
1.0000 | ORAL_TABLET | Freq: Every day | ORAL | Status: DC
  Administered 2021-10-29 – 2021-11-10 (×12): 1 via ORAL
  Filled 2021-10-29 (×13): qty 1

## 2021-10-29 MED ORDER — ORAL CARE MOUTH RINSE
15.0000 mL | OROMUCOSAL | Status: DC | PRN
Start: 2021-10-29 — End: 2021-11-10

## 2021-10-29 MED ORDER — CHLORHEXIDINE GLUCONATE CLOTH 2 % EX PADS
6.0000 | MEDICATED_PAD | Freq: Every day | CUTANEOUS | Status: DC
  Administered 2021-10-29 – 2021-11-01 (×4): 6 via TOPICAL

## 2021-10-29 MED ORDER — TBO-FILGRASTIM 300 MCG/0.5ML ~~LOC~~ SOSY
300.0000 ug | PREFILLED_SYRINGE | Freq: Every day | SUBCUTANEOUS | Status: AC
  Administered 2021-10-29 – 2021-10-31 (×3): 300 ug via SUBCUTANEOUS
  Filled 2021-10-29 (×3): qty 0.5

## 2021-10-29 NOTE — Consult Note (Signed)
NAME: Jill Rodriguez  DOB: 05-04-1960  MRN: [id number]  Date/Time: 10/29/2021 2:18 PM  REQUESTING PROVIDER: Dr.Caldwell Subjective:  REASON FOR CONSULT: Candidemia ?pt is a Poor historian- with spanish video interpretor spoke to her and family at bed side  Jill Rodriguez is a 61 y.o. 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 Chemoradiation completed  July 2021 05/09/20 Biopsy of left supraclavicular LN conssitent wit metastatic cervical cancer. She took 2 cycles of carboplatin/taxol and bevacizumab 05/30/20-07/10/20. Pt declined further chemo Sept 2022 CT chest /abd/pelvis showed multiple new small Pulmonary nodules throughout lungs. She was lost to follow up . Then due to change of insurance she had to go to [facility]. She had persistent cough followed by post tussive emesis for a few months and CT chest showed pulmonary nodules/metastasis She agreed to restart treatment On 09/04/21 she had a port placed and the area was red for a few days and was prescribed Keflex. She was started on chemo on 09/06/21 C1D! Of cisplatin, paclitaxel and bevacizumab. After C2D7 on 10/03/21 she was hospitalized for vomiting She also reported hoarseness of voice and cough She was treated symptomatically- She also received antibiotics/nitrofurantoin for asymptomatic bacteriuria with ESBl . E.coli. She was discharge don 10/10/21 She last received chemo in 10/17/21  I am seeing the patient for candidemia Past Medical History:  Diagnosis Date   Cancer [facility])    cervical CA, that spread to lymph nodes; last chemo was 12/08/2019;  radiation 01/2020   COVID-19 10/2018   Nausea with vomiting 10/31/2019   Neoplasm related pain    No pertinent past medical history     Past Surgical History:  Procedure Laterality Date   ESOPHAGOGASTRODUODENOSCOPY (EGD) WITH PROPOFOL N/A 03/28/2020   Procedure: ESOPHAGOGASTRODUODENOSCOPY (EGD) WITH PROPOFOL;  Surgeon: Milus Banister, MD;  Location: [facility] ENDOSCOPY;  Service: Endoscopy;  Laterality: N/A;   EUS N/A 03/28/2020   Procedure: UPPER ENDOSCOPIC ULTRASOUND (EUS) RADIAL;  Surgeon: Milus Banister, MD;  Location: [facility]OSCOPY;  Service: Endoscopy;  Laterality: N/A;   no surgical history      Social History   Socioeconomic History   Marital status: Married    Spouse name: Rosezetta Schlatter    Number of children: 3   Years of education: Not on file   Highest education level: Not on file  Occupational History   Not on file  Tobacco Use   Smoking status: Never   Smokeless tobacco: Never  Vaping Use   Vaping Use: Never used  Substance and Sexual Activity   Alcohol use: Never   Drug use: Never   Sexual activity: Yes    Birth control/protection: None  Other Topics Concern   Not on file  Social History Narrative   Lives at home with spouse ; daughter [name] comes & stays when needed   Social Determinants of Health   Financial Resource Strain: Not on file  Food Insecurity: Not on file  Transportation Needs: Not on file  Physical Activity: Not on file  Stress: Not on file  Social Connections: Not on file  Intimate  Partner Violence: Not on file    Family History  Problem Relation Age of Onset   Blindness Mother    Glaucoma Mother    Diabetes Father    Diabetes Brother    No Known Allergies I? Current Facility-Administered Medications  Medication Dose Route Frequency Provider Last Rate Last Admin   acetaminophen (TYLENOL) tablet 650 mg  650 mg Oral Q6H PRN Athena Masse, MD   650 mg at 10/29/21 1123   Or   acetaminophen (TYLENOL) suppository 650 mg  650 mg Rectal Q6H  PRN Athena Masse, MD       Chlorhexidine Gluconate Cloth 2 % PADS 6 each  6 each Topical Q0600 Foust, Katy L, NP   6 each at 10/29/21 0539   feeding supplement (BOOST / RESOURCE BREEZE) liquid 1 Container  1 Container Oral TID BM Elodia Florence., MD       fluconazole (DIFLUCAN) IVPB 400 mg  400 mg Intravenous Once Elodia Florence., MD 100 mL/hr at 10/29/21 1407 400 mg at 10/29/21 1407   And   fluconazole (DIFLUCAN) IVPB 400 mg  400 mg Intravenous Once Elodia Florence., MD       Derrill Memo ON 10/30/2021] fluconazole (DIFLUCAN) IVPB 400 mg  400 mg Intravenous Q24H Elodia Florence., MD       lactated ringers infusion   Intravenous Continuous Athena Masse, MD 125 mL/hr at 10/29/21 0959 New Bag at 10/29/21 0959   meropenem (MERREM) 1 g in sodium chloride 0.9 % 100 mL IVPB  1 g Intravenous Q8H Judd Gaudier V, MD 200 mL/hr at 10/29/21 1325 1 g at 10/29/21 1325   morphine (PF) 2 MG/ML injection 2 mg  2 mg Intravenous Q2H PRN Athena Masse, MD       multivitamin with minerals tablet 1 tablet  1 tablet Oral Daily Elodia Florence., MD   1 tablet at 10/29/21 1218   ondansetron (ZOFRAN) tablet 4 mg  4 mg Oral Q6H PRN Athena Masse, MD       Or   ondansetron [facility]) injection 4 mg  4 mg Intravenous Q6H PRN Athena Masse, MD   4 mg at 10/29/21 1140   Oral care mouth rinse  15 mL Mouth Rinse 4 times per day Neomia Glass L, NP       Oral care mouth rinse  15 mL Mouth Rinse PRN Foust, Katy L, NP       promethazine (PHENERGAN) 12.5 mg in sodium chloride 0.9 % 50 mL IVPB  12.5 mg Intravenous Q6H PRN Athena Masse, MD       scopolamine (TRANSDERM-SCOP) 1 MG/3DAYS 1.5 mg  1 patch Transdermal Q72H Judd Gaudier V, MD   1.5 mg at 10/28/21 2337   vancomycin (VANCOREADY) IVPB 750 mg/150 mL  750 mg Intravenous Q12H Dorothe Pea, RPH 150 mL/hr at 10/29/21 1203 750 mg at 10/29/21 1203     Abtx:  Anti-infectives (From admission, onward)    Start     Dose/Rate Route Frequency  Ordered Stop   10/30/21 1600  fluconazole (DIFLUCAN) IVPB 400 mg        400 mg 100 mL/hr over 120 Minutes Intravenous Every 24 hours 10/29/21 1250     10/29/21 2300  vancomycin (VANCOREADY) IVPB 750 mg/150 mL  Status:  Discontinued        750 mg 150 mL/hr over 60 Minutes Intravenous Every 24 hours 10/28/21 2234 10/29/21 1028  10/29/21 1630  fluconazole (DIFLUCAN) IVPB 400 mg       See Hyperspace for full Linked Orders Report.   400 mg 100 mL/hr over 120 Minutes Intravenous  Once 10/29/21 1250     10/29/21 1430  fluconazole (DIFLUCAN) IVPB 400 mg       See Hyperspace for full Linked Orders Report.   400 mg 100 mL/hr over 120 Minutes Intravenous  Once 10/29/21 1250     10/29/21 1315  micafungin (MYCAMINE) 100 mg in sodium chloride 0.9 % 100 mL IVPB  Status:  Discontinued        100 mg 105 mL/hr over 1 Hours Intravenous Every 24 hours 10/29/21 1220 10/29/21 1250   10/29/21 1200  vancomycin (VANCOREADY) IVPB 750 mg/150 mL        750 mg 150 mL/hr over 60 Minutes Intravenous Every 12 hours 10/29/21 1028     10/28/21 2245  meropenem (MERREM) 1 g in sodium chloride 0.9 % 100 mL IVPB        1 g 200 mL/hr over 30 Minutes Intravenous Every 8 hours 10/28/21 2231     10/28/21 2245  vancomycin (VANCOREADY) IVPB 1750 mg/350 mL        1,750 mg 175 mL/hr over 120 Minutes Intravenous  Once 10/28/21 2231 10/29/21 0119   10/28/21 2015  ceFEPIme (MAXIPIME) 2 g in sodium chloride 0.9 % 100 mL IVPB        2 g 200 mL/hr over 30 Minutes Intravenous  Once 10/28/21 2009 10/28/21 2151   10/28/21 1845  cefTRIAXone (ROCEPHIN) 1 g in sodium chloride 0.9 % 100 mL IVPB  Status:  Discontinued        1 g 200 mL/hr over 30 Minutes Intravenous  Once 10/28/21 1838 10/28/21 1839       REVIEW OF SYSTEMS:  Const: feeling cold, poor appetite,  Eyes: negative diplopia or visual changes, negative eye pain ENT: negative coryza, raspy voice Resp: +++ cough, ( bovine ) clear sputum, not much, no hemoptysis, has dyspnea  after coughing Cards: negative for chest pain, palpitations, lower extremity edema GU: negative for frequency, dysuria and hematuria GI: Negative for abdominal pain, diarrhea, bleeding, constipation, has vomiting after coughing, no appetite Skin: negative for rash and pruritus Heme: negative for easy bruising and gum/nose bleeding MS: weakness Neurolo:negative for headaches, has dizziness, no vertigo, memory problems  Psych: depression  Endocrine: negative for thyroid, diabetes Allergy/Immunology- negative for any medication or food allergies ?  Objective:  VITALS:  BP (!) 151/76 (BP Location: Left Arm)   Pulse 91   Temp 99.7 F (37.6 C)   Resp 16   Ht '5\' 4"'$  (1.626 m)   Wt 73 kg   SpO2 99%   BMI 27.62 kg/m   PHYSICAL EXAM:  General: awake, chronically ill, flat affect, does not talk much, raspy voice Head: Normocephalic, without obvious abnormality, atraumatic. Eyes: Conjunctivae clear, anicteric sclerae. Pupils are equal ENT unable to examine posterior pharynx  Neck: Supple, cervical adenopathy,  Back: No CVA tenderness. Lungs: b/l air entry Heart: s1s2 PORT site on left chest wall Abdomen: Soft, non-tender,not distended. Bowel sounds normal. No masses Extremities: atraumatic, no cyanosis. No edema. No clubbing Skin: No rashes or lesions. Or bruising Lymph: Cervical, supraclavicular felt Neurologic: Grossly non-focal Pertinent Labs Lab Results CBC    Component Value Date/Time   WBC 1.0 (LL) 10/29/2021 0554   WBC 1.0 (LL) 10/29/2021 0554   RBC 2.53 (L) 10/29/2021 0554   RBC 2.51 (L) 10/29/2021 0102  HGB 7.9 (L) 10/29/2021 0554   HGB 7.8 (L) 10/29/2021 0554   HGB 13.8 08/23/2019 0915   HCT 23.6 (L) 10/29/2021 0554   HCT 23.3 (L) 10/29/2021 0554   HCT 42.0 08/23/2019 0915   PLT 47 (L) 10/29/2021 0554   PLT 50 (L) 10/29/2021 0554   PLT 254 08/23/2019 0915   MCV 93.3 10/29/2021 0554   MCV 92.8 10/29/2021 0554   MCV 91 08/23/2019 0915   MCH 31.2 10/29/2021  0554   MCH 31.1 10/29/2021 0554   MCHC 33.5 10/29/2021 0554   MCHC 33.5 10/29/2021 0554   RDW 14.6 10/29/2021 0554   RDW 14.8 10/29/2021 0554   RDW 12.8 08/23/2019 0915   LYMPHSABS 0.6 (L) 10/29/2021 0554   MONOABS 0.3 10/29/2021 0554   EOSABS 0.0 10/29/2021 0554   BASOSABS 0.0 10/29/2021 0554       Latest Ref Rng & Units 10/29/2021    5:54 AM 10/28/2021    3:31 PM 08/10/2021    2:28 AM  CMP  Glucose 70 - 99 mg/dL 90  107  112   BUN 6 - 20 mg/dL '18  23  13   '$ Creatinine 0.44 - 1.00 mg/dL 0.85  1.10  0.68   Sodium 135 - 145 mmol/L 137  134  139   Potassium 3.5 - 5.1 mmol/L 3.4  3.6  3.3   Chloride 98 - 111 mmol/L 104  99  106   CO2 22 - 32 mmol/L '28  28  26   '$ Calcium 8.9 - 10.3 mg/dL 8.4  8.7  8.8   Total Protein 6.5 - 8.1 g/dL  6.4  7.5   Total Bilirubin 0.3 - 1.2 mg/dL  0.8  0.9   Alkaline Phos 38 - 126 U/L  68  81   AST 15 - 41 U/L  30  28   ALT 0 - 44 U/L  42  25       Microbiology: Recent Results (from the past 240 hour(s))  SARS Coronavirus 2 by RT PCR (hospital order, performed in [place] hospital lab) *cepheid single result test* Anterior Nasal Swab     Status: None   Collection Time: 10/28/21  6:51 PM   Specimen: Anterior Nasal Swab  Result Value Ref Range Status   SARS Coronavirus 2 by RT PCR NEGATIVE NEGATIVE Final    Comment: (NOTE) SARS-CoV-2 target nucleic acids are NOT DETECTED.  The SARS-CoV-2 RNA is generally detectable in upper and lower respiratory specimens during the acute phase of infection. The lowest concentration of SARS-CoV-2 viral copies this assay can detect is 250 copies / mL. A negative result does not preclude SARS-CoV-2 infection and should not be used as the sole basis for treatment or other patient management decisions.  A negative result may occur with improper specimen collection / handling, submission of specimen other than nasopharyngeal swab, presence of viral mutation(s) within the areas targeted by this assay, and inadequate  number of viral copies (<250 copies / mL). A negative result must be combined with clinical observations, patient history, and epidemiological information.  Fact Sheet for Patients:   https://www.patel.info/  Fact Sheet for Healthcare Providers: https://hall.com/  This test is not yet approved or  cleared by the [place] FDA and has been authorized for detection and/or diagnosis of SARS-CoV-2 by FDA under an Emergency Use Authorization (EUA).  This EUA will remain in effect (meaning this test can be used) for the duration of the COVID-19 declaration under Section 564(b)(1) of the  Act, 21 U.S.C. section 360bbb-3(b)(1), unless the authorization is terminated or revoked sooner.  Performed at [facility], [address] [place], [place] [address]   Blood culture (routine x 2)     Status: None (Preliminary result)   Collection Time: 10/28/21  8:25 PM   Specimen: BLOOD  Result Value Ref Range Status   Specimen Description BLOOD LEFT ASSIST CONTROL  Final   Special Requests   Final    BOTTLES DRAWN AEROBIC AND ANAEROBIC Blood Culture adequate volume   Culture  Setup Time   Final    YEAST IN BOTH AEROBIC AND ANAEROBIC BOTTLES CRITICAL VALUE NOTED.  VALUE IS CONSISTENT WITH PREVIOUSLY REPORTED AND CALLED VALUE. Performed at [facility], [address] [place], [place] [address]    Culture YEAST  Final   Report Status PENDING  Incomplete  Blood culture (routine x 2)     Status: None (Preliminary result)   Collection Time: 10/28/21  8:25 PM   Specimen: BLOOD  Result Value Ref Range Status   Specimen Description BLOOD PORTA CATH  Final   Special Requests   Final    BOTTLES DRAWN AEROBIC AND ANAEROBIC Blood Culture adequate volume   Culture  Setup Time   Final    Organism ID to follow IN BOTH AEROBIC AND ANAEROBIC BOTTLES YEAST CRITICAL RESULT CALLED TO, READ BACK BY AND VERIFIED WITH: Carey Bullocks 10/29/2021 at  1137 SRR Performed at [facility], [address] [place], [place] [address]    Culture YEAST  Final   Report Status PENDING  Incomplete  Blood Culture ID Panel (Reflexed)     Status: Abnormal   Collection Time: 10/28/21  8:25 PM  Result Value Ref Range Status   Enterococcus faecalis NOT DETECTED NOT DETECTED Final   Enterococcus Faecium NOT DETECTED NOT DETECTED Final   Listeria monocytogenes NOT DETECTED NOT DETECTED Final   Staphylococcus species NOT DETECTED NOT DETECTED Final   Staphylococcus aureus (BCID) NOT DETECTED NOT DETECTED Final   Staphylococcus epidermidis NOT DETECTED NOT DETECTED Final   Staphylococcus lugdunensis NOT DETECTED NOT DETECTED Final   Streptococcus species NOT DETECTED NOT DETECTED Final   Streptococcus agalactiae NOT DETECTED NOT DETECTED Final   Streptococcus pneumoniae NOT DETECTED NOT DETECTED Final   Streptococcus pyogenes NOT DETECTED NOT DETECTED Final   A.calcoaceticus-baumannii NOT DETECTED NOT DETECTED Final   Bacteroides fragilis NOT DETECTED NOT DETECTED Final   Enterobacterales NOT DETECTED NOT DETECTED Final   Enterobacter cloacae complex NOT DETECTED NOT DETECTED Final   Escherichia coli NOT DETECTED NOT DETECTED Final   Klebsiella aerogenes NOT DETECTED NOT DETECTED Final   Klebsiella oxytoca NOT DETECTED NOT DETECTED Final   Klebsiella pneumoniae NOT DETECTED NOT DETECTED Final   Proteus species NOT DETECTED NOT DETECTED Final   Salmonella species NOT DETECTED NOT DETECTED Final   Serratia marcescens NOT DETECTED NOT DETECTED Final   Haemophilus influenzae NOT DETECTED NOT DETECTED Final   Neisseria meningitidis NOT DETECTED NOT DETECTED Final   Pseudomonas aeruginosa NOT DETECTED NOT DETECTED Final   Stenotrophomonas maltophilia NOT DETECTED NOT DETECTED Final   Candida albicans DETECTED (A) NOT DETECTED Final    Comment: CRITICAL RESULT CALLED TO, READ BACK BY AND VERIFIED WITH: Carey Bullocks 10/29/2021 at 1137 SRR     Candida auris NOT DETECTED NOT DETECTED Final   Candida glabrata NOT DETECTED NOT DETECTED Final   Candida krusei NOT DETECTED NOT DETECTED Final   Candida parapsilosis NOT DETECTED NOT DETECTED Final  Candida tropicalis NOT DETECTED NOT DETECTED Final   Cryptococcus neoformans/gattii NOT DETECTED NOT DETECTED Final    Comment: Performed at [facility], [address] [place], [place] [address]  MRSA Next Gen by PCR, Nasal     Status: None   Collection Time: 10/29/21 12:11 PM   Specimen: Nasal Mucosa; Nasal Swab  Result Value Ref Range Status   MRSA by PCR Next Gen NOT DETECTED NOT DETECTED Final    Comment: (NOTE) The GeneXpert MRSA Assay (FDA approved for NASAL specimens only), is one component of a comprehensive MRSA colonization surveillance program. It is not intended to diagnose MRSA infection nor to guide or monitor treatment for MRSA infections. Test performance is not FDA approved in patients less than 15 years old. Performed at [facility], [address] [place], [place] [address]     IMAGING RESULTS: CXR Low lung volumes- left basilar consolidation/atelectasis I have personally reviewed the films ? 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 ? __ Advanced malignancy with mets- very poor prognosis- need palliative  consult_  ________________________________________________ Discussed with patient,family  requesting provider Note:  This document was prepared using Dragon voice  recognition software and may include unintentional dictation errors.

## 2021-10-29 NOTE — Assessment & Plan Note (Signed)
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

## 2021-10-29 NOTE — Progress Notes (Signed)
*  PRELIMINARY RESULTS* Echocardiogram 2D Echocardiogram has been performed.  Jill Rodriguez 10/29/2021, 3:06 PM

## 2021-10-29 NOTE — Assessment & Plan Note (Addendum)
Notable changes on EKG done for QTc eval ST depressions and t wave inversions in I, aVL and ST elevations in II, III -> appear new.  Symptoms mainly include nausea/vomiting at this time.  No CP. Repeat EKG Echo, troponin, cardiology c/s.  Hold aspirin with severe thrombocytopenia (<50).   Not candidate for invasive procedure per discussion with cards, will w/u as above

## 2021-10-29 NOTE — Assessment & Plan Note (Addendum)
Follow final blood cultures Repeat blood cultures in AM Echo She does have Kelvon Giannini port, positive blood culture was drawn from port ID consult micafungin

## 2021-10-29 NOTE — Progress Notes (Signed)
Initial Nutrition Assessment  DOCUMENTATION CODES:   Not applicable  INTERVENTION:   -Boost Breeze po TID, each supplement provides 250 kcal and 9 grams of protein  -MVI with minerals daily -RD will follow for diet advancement and add supplements as appropriate  NUTRITION DIAGNOSIS:   Inadequate oral intake related to poor appetite as evidenced by per patient/family report.  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  Pt admitted with chemotherapy induced nausea and vomiting.   Reviewed I/O's: +375 ml x 24 hours   UOP: 700 ml x 24 hours  Pt sleeping soundly at time of visit. She did not arouse to touch or voice.   Pt currently on a clear liquid diet. No meal completion data currently available at this time.    Reviewed wt hx; pt has experienced a 13.6% wt loss over the past 3 months, which is significant for time frame.   Medications reviewed and include diflucan and lactated ringers infusion @ 125 ml/hr.   Labs reviewed: K: 3.4.   NUTRITION - FOCUSED PHYSICAL EXAM:  Flowsheet Row Most Recent Value  Orbital Region No depletion  Upper Arm Region No depletion  Thoracic and Lumbar Region No depletion  Buccal Region No depletion  Temple Region No depletion  Clavicle Bone Region No depletion  Clavicle and Acromion Bone Region No depletion  Scapular Bone Region No depletion  Dorsal Hand No depletion  Patellar Region No depletion  Anterior Thigh Region No depletion  Posterior Calf Region No depletion  Edema (RD Assessment) None  Hair Reviewed   Eyes Reviewed  Mouth Reviewed  Skin Reviewed  Nails Reviewed       Diet Order:   Diet Order             Diet clear liquid Room service appropriate? Yes; Fluid consistency: Thin  Diet effective now                   EDUCATION NEEDS:   No education needs have been identified at this time  Skin:  Skin Assessment: Reviewed RN Assessment  Last BM:  10/29/21  Height:   Ht Readings from Last 1 Encounters:  10/29/21 '5\' 4"'$  (1.626 m)    Weight:   Wt Readings from Last 1 Encounters:  10/29/21 73 kg    Ideal Body Weight:  54.5 kg  BMI:  Body mass index is 27.62 kg/m.  Estimated Nutritional Needs:   Kcal:  2000-2200  Protein:  105-120 grams  Fluid:  > 2 L    Loistine Chance, RD, LDN, [place] Registered Dietitian II Certified Diabetes Care and Education Specialist Please refer to [facility] for RD and/or RD on-call/weekend/after hours pager

## 2021-10-29 NOTE — Assessment & Plan Note (Signed)
Currently on RA Will continue abx for now with abnormal CXR and neutropenia Follow and deescalate as able

## 2021-10-29 NOTE — Progress Notes (Signed)
PROGRESS NOTE    Jill Rodriguez  XTK:[id number] DOB: 1960-06-14 DOA: 10/28/2021 PCP: Pcp, No  Chief Complaint  Patient presents with   Nausea   Emesis    Brief Narrative:  [name] is Jill Rodriguez 61 y.o. 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 coli UTI sensitive only to ertapenem and nitrofurantoin, with last chemotherapy infusion on 6/23 who presents to the ED with intractable nausea and vomiting that started 2 days prior.   During her last chemotherapy session she developed acute nausea and vomiting that was treated with Compazine.  Her nausea and vomiting was managed for the first few days with dexamethasone, Marinol, Zofran, Phenergan suppository and scopolamine patch, however 2 days ago she developed intractable vomiting associated with weakness.  According to her husband at the bedside, patient has been having bouts of coughing for the past several weeks and now every time she has Jaiveon Suppes bout of coughing she vomits.  He feels like this happens every 4-6 hours over the past two days.  She has not had shortness of breath, chest pain or fever.  She's been admitted with nausea/vomiting, neutropenia.  She's now been shown to have candidemia.  Currently on broad spectrum antiinfectives.  ID and oncology consulted.  She has markedly abnormal EKG, cardiology c/s pending.   See below for additional details    Assessment & Plan:   Principal Problem:   Chemotherapy induced nausea and vomiting Active Problems:   Neutropenic fever (HCC)   Candidemia ([place])   Pyuria   HCAP (healthcare-associated pneumonia)   Pancytopenia due to antineoplastic chemotherapy (HCC)   Abnormal EKG   AKI (acute kidney injury) (HCC)    Abnormal CT of the head   Assessment and Plan: * Chemotherapy induced nausea and vomiting EKG for qt eval (460s) Continue scopolamine zofran and phenergan as needed Consult oncology in the [facility]  Candidemia ([place]) Follow final blood cultures Repeat blood cultures in AM Echo She does have Nanda Bittick port, positive blood culture was drawn from port ID consult micafungin  Neutropenic fever (HCC) 1 of 2 blood cultures with yeast (candida albicans on BCID) UA also concerning for possible UTI, CXR with dense left basilar opacification Covering for fungemia as well as UTI (recent urine culture 10/04/2021 in care everywhere only sensitive to carbapenems and nitrofurantoin) Follow urine culture Follow final blood cultures Continue broad spectrum abx for now.  Micafungin. ID consult Oncology consult for neutropenia  HCAP (healthcare-associated pneumonia) Currently on RA Will continue abx for now with abnormal CXR and neutropenia Follow and deescalate as able  Pyuria Meropenem based on past sensitivities Follow cultures   Abnormal EKG Notable changes on EKG done for QTc eval ST depressions and t wave inversions in I, aVL and ST elevations in II, III -> appear new.  Symptoms mainly include nausea/vomiting at this time.  No CP. Repeat EKG Echo, troponin, cardiology c/s.  Hold aspirin with severe thrombocytopenia (<50).   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  Will plan for MRI       DVT prophylaxis: scd Code Status: full Family Communication: husband Disposition:   Status  is: Inpatient Remains inpatient appropriate because: need for id, cards, oncology eval   Consultants:  Cardiology Oncology ID  Procedures:  none  Antimicrobials:  Anti-infectives (From admission, onward)    Start     Dose/Rate Route Frequency Ordered Stop   10/30/21 1600  fluconazole (DIFLUCAN) IVPB 400 mg        400 mg 100 mL/hr over 120 Minutes Intravenous Every 24 hours 10/29/21 1250     10/29/21 2300  vancomycin (VANCOREADY) IVPB 750 mg/150 mL  Status:  Discontinued        750 mg 150 mL/hr over 60 Minutes Intravenous Every 24 hours 10/28/21 2234 10/29/21 1028   10/29/21 1630  fluconazole (DIFLUCAN) IVPB 400 mg       See Hyperspace for full Linked Orders Report.   400 mg 100 mL/hr over 120 Minutes Intravenous  Once 10/29/21 1250     10/29/21 1430  fluconazole (DIFLUCAN) IVPB 400 mg       See Hyperspace for full Linked Orders Report.   400 mg 100 mL/hr over 120 Minutes Intravenous  Once 10/29/21 1250     10/29/21 1315  micafungin (MYCAMINE) 100 mg in sodium chloride 0.9 % 100 mL IVPB  Status:  Discontinued        100 mg 105 mL/hr over 1 Hours Intravenous Every 24 hours 10/29/21 1220 10/29/21 1250   10/29/21 1200  vancomycin (VANCOREADY) IVPB 750 mg/150 mL        750 mg 150 mL/hr over 60 Minutes Intravenous Every 12 hours 10/29/21 1028     10/28/21 2245  meropenem (MERREM) 1 g in sodium chloride 0.9 % 100 mL IVPB        1 g 200 mL/hr over 30 Minutes Intravenous Every 8 hours 10/28/21 2231     10/28/21 2245  vancomycin (VANCOREADY) IVPB 1750 mg/350 mL        1,750 mg 175 mL/hr over 120 Minutes Intravenous  Once 10/28/21 2231 10/29/21 0119   10/28/21 2015  ceFEPIme (MAXIPIME) 2 g in sodium chloride 0.9 % 100 mL IVPB        2 g 200 mL/hr over 30 Minutes Intravenous  Once 10/28/21 2009 10/28/21 2151   10/28/21 1845  cefTRIAXone (ROCEPHIN) 1 g in sodium chloride 0.9 % 100 mL IVPB  Status:  Discontinued        1 g 200 mL/hr over 30 Minutes Intravenous  Once 10/28/21 1838  10/28/21 1839       Subjective: C/o nausea, headache Husband at bedside Used interpreter  Objective: Vitals:   10/28/21 2330 10/29/21 0159 10/29/21 0614 10/29/21 0726  BP: (!) 141/83 125/63 (!) 116/58 (!) 151/76  Pulse: 98 99 91 91  Resp: '19 19 16   '$ Temp:  99.1 F (37.3 C) 99.7 F (37.6 C) 99.7 F (37.6 C)  TempSrc:  Oral    SpO2: 95% 96% 97% 99%  Weight:  73 kg    Height:  '5\' 4"'$  (1.626 m)      Intake/Output Summary (Last 24 hours) at 10/29/2021 1254 Last data filed at 10/29/2021 0610 Gross per 24 hour  Intake 1075.06 ml  Output 700 ml  Net 375.06 ml   Filed Weights   10/28/21 1529 10/29/21 0159  Weight: 77.1 kg 73 kg  Examination:  General exam: Appears calm, but uncomfortable, vomiting during my eval Respiratory system: unlabored Cardiovascular system: RRR Gastrointestinal system: Abdomen is nondistended, soft and nontender.  Central nervous system: Alert and oriented. No focal neurological deficits. Extremities: no LEE  Data Reviewed: I have personally reviewed following labs and imaging studies  CBC: Recent Labs  Lab 10/28/21 1531 10/29/21 0554  WBC 1.0*  1.0* 1.0*  1.0*  NEUTROABS 0.1* 0.2*  HGB 8.7*  8.8* 7.8*  7.9*  HCT 26.1*  26.4* 23.3*  23.6*  MCV 93.2  92.3 92.8  93.3  PLT 61*  59* 50*  47*    Basic Metabolic Panel: Recent Labs  Lab 10/28/21 1531 10/29/21 0554  NA 134* 137  K 3.6 3.4*  CL 99 104  CO2 28 28  GLUCOSE 107* 90  BUN 23* 18  CREATININE 1.10* 0.85  CALCIUM 8.7* 8.4*    GFR: Estimated Creatinine Clearance: 68.9 mL/min (by C-G formula based on SCr of 0.85 mg/dL).  Liver Function Tests: Recent Labs  Lab 10/28/21 1531  AST 30  ALT 42  ALKPHOS 68  BILITOT 0.8  PROT 6.4*  ALBUMIN 3.3*    CBG: No results for input(s): "GLUCAP" in the last 168 hours.   Recent Results (from the past 240 hour(s))  SARS Coronavirus 2 by RT PCR (hospital order, performed in [facility] hospital lab) *cepheid single  result test* Anterior Nasal Swab     Status: None   Collection Time: 10/28/21  6:51 PM   Specimen: Anterior Nasal Swab  Result Value Ref Range Status   SARS Coronavirus 2 by RT PCR NEGATIVE NEGATIVE Final    Comment: (NOTE) SARS-CoV-2 target nucleic acids are NOT DETECTED.  The SARS-CoV-2 RNA is generally detectable in upper and lower respiratory specimens during the acute phase of infection. The lowest concentration of SARS-CoV-2 viral copies this assay can detect is 250 copies / mL. Briseis Aguilera negative result does not preclude SARS-CoV-2 infection and should not be used as the sole basis for treatment or other patient management decisions.  Reyli Schroth negative result may occur with improper specimen collection / handling, submission of specimen other than nasopharyngeal swab, presence of viral mutation(s) within the areas targeted by this assay, and inadequate number of viral copies (<250 copies / mL). Ladarrian Asencio negative result must be combined with clinical observations, patient history, and epidemiological information.  Fact Sheet for Patients:   https://www.patel.info/  Fact Sheet for Healthcare Providers: https://hall.com/  This test is not yet approved or  cleared by the [place] FDA and has been authorized for detection and/or diagnosis of SARS-CoV-2 by FDA under an Emergency Use Authorization (EUA).  This EUA will remain in effect (meaning this test can be used) for the duration of the COVID-19 declaration under Section 564(b)(1) of the Act, 21 U.S.C. section 360bbb-3(b)(1), unless the authorization is terminated or revoked sooner.  Performed at [facility], [address] [place], [place] [address]   Blood culture (routine x 2)     Status: None (Preliminary result)   Collection Time: 10/28/21  8:25 PM   Specimen: BLOOD  Result Value Ref Range Status   Specimen Description BLOOD LEFT ASSIST CONTROL  Final   Special Requests   Final     BOTTLES DRAWN AEROBIC AND ANAEROBIC Blood Culture adequate volume   Culture   Final    NO GROWTH < 12 HOURS Performed at [facility], [address], [place], [place] [address]    Report Status PENDING  Incomplete  Blood culture (routine x 2)     Status: None (Preliminary result)   Collection Time: 10/28/21  8:25 PM   Specimen: BLOOD  Result Value Ref Range Status   Specimen Description BLOOD PORTA CATH  Final   Special Requests   Final    BOTTLES DRAWN AEROBIC AND ANAEROBIC Blood Culture adequate volume   Culture  Setup Time   Final    Organism ID to follow AEROBIC BOTTLE ONLY YEAST CRITICAL RESULT CALLED TO, READ BACK BY AND VERIFIED WITH: Carey Bullocks 10/29/2021 at 1137 SRR Performed at [facility], [address] [place], [place] [address]    Culture YEAST  Final   Report Status PENDING  Incomplete  Blood Culture ID Panel (Reflexed)     Status: Abnormal   Collection Time: 10/28/21  8:25 PM  Result Value Ref Range Status   Enterococcus faecalis NOT DETECTED NOT DETECTED Final   Enterococcus Faecium NOT DETECTED NOT DETECTED Final   Listeria monocytogenes NOT DETECTED NOT DETECTED Final   Staphylococcus species NOT DETECTED NOT DETECTED Final   Staphylococcus aureus (BCID) NOT DETECTED NOT DETECTED Final   Staphylococcus epidermidis NOT DETECTED NOT DETECTED Final   Staphylococcus lugdunensis NOT DETECTED NOT DETECTED Final   Streptococcus species NOT DETECTED NOT DETECTED Final   Streptococcus agalactiae NOT DETECTED NOT DETECTED Final   Streptococcus pneumoniae NOT DETECTED NOT DETECTED Final   Streptococcus pyogenes NOT DETECTED NOT DETECTED Final   Dayzha Pogosyan.calcoaceticus-baumannii NOT DETECTED NOT DETECTED Final   Bacteroides fragilis NOT DETECTED NOT DETECTED Final   Enterobacterales NOT DETECTED NOT DETECTED Final   Enterobacter cloacae complex NOT DETECTED NOT DETECTED Final   Escherichia coli NOT DETECTED NOT DETECTED Final   Klebsiella aerogenes  NOT DETECTED NOT DETECTED Final   Klebsiella oxytoca NOT DETECTED NOT DETECTED Final   Klebsiella pneumoniae NOT DETECTED NOT DETECTED Final   Proteus species NOT DETECTED NOT DETECTED Final   Salmonella species NOT DETECTED NOT DETECTED Final   Serratia marcescens NOT DETECTED NOT DETECTED Final   Haemophilus influenzae NOT DETECTED NOT DETECTED Final   Neisseria meningitidis NOT DETECTED NOT DETECTED Final   Pseudomonas aeruginosa NOT DETECTED NOT DETECTED Final   Stenotrophomonas maltophilia NOT DETECTED NOT DETECTED Final   Candida albicans DETECTED (Gregroy Dombkowski) NOT DETECTED Final    Comment: CRITICAL RESULT CALLED TO, READ BACK BY AND VERIFIED WITH: Carey Bullocks 10/29/2021 at 1137 SRR    Candida auris NOT DETECTED NOT DETECTED Final   Candida glabrata NOT DETECTED NOT DETECTED Final   Candida krusei NOT DETECTED NOT DETECTED Final   Candida parapsilosis NOT DETECTED NOT DETECTED Final   Candida tropicalis NOT DETECTED NOT DETECTED Final   Cryptococcus neoformans/gattii NOT DETECTED NOT DETECTED Final    Comment: Performed at [facility], [address] [place], [place] [address]         Radiology Studies: DG Chest Portable 1 View  Result Date: 10/28/2021 CLINICAL DATA:  Cough EXAM: PORTABLE CHEST 1 VIEW COMPARISON:  08/10/2021 FINDINGS: Interval placement of Loucinda Croy right-sided chest port with distal tip terminating near the level of the superior cavoatrial junction. Low lung volumes with chronic elevation of the left hemidiaphragm. Heart size and mediastinal contours are largely obscured. Dense left basilar opacification may reflect atelectasis, consolidation, and or effusion. No pneumothorax. IMPRESSION: Low lung volumes with chronic elevation of the left hemidiaphragm. Dense left basilar opacification may reflect atelectasis, consolidation, and/or effusion. Electronically Signed   By: Davina Poke D.O.   On: 10/28/2021  16:42   CT HEAD WO CONTRAST  Result Date:  10/28/2021 CLINICAL DATA:  61 year old female with headache following injury. Initial encounter. EXAM: CT HEAD WITHOUT CONTRAST TECHNIQUE: Contiguous axial images were obtained from the base of the skull through the vertex without intravenous contrast. RADIATION DOSE REDUCTION: This exam was performed according to the departmental dose-optimization program which includes automated exposure control, adjustment of the mA and/or kV according to patient size and/or use of iterative reconstruction technique. COMPARISON:  None Available. FINDINGS: Brain: No evidence of acute infarction, hemorrhage, hydrocephalus, extra-axial collection or mass lesion/mass effect. The cerebellar tonsils appear low lying. Vascular: Carotid and vertebral atherosclerotic calcifications are noted. Skull: Normal. Negative for fracture or focal lesion. Sinuses/Orbits: No acute finding. Other: None. IMPRESSION: 1. No evidence of acute intracranial abnormality. 2. 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 Consider MRI for further evaluation as indicated. Electronically Signed   By: Margarette Canada M.D.   On: 10/28/2021 16:03        Scheduled Meds:  Chlorhexidine Gluconate Cloth  6 each Topical Q0600   feeding supplement  1 Container Oral TID BM   multivitamin with minerals  1 tablet Oral Daily   mouth rinse  15 mL Mouth Rinse 4 times per day   scopolamine  1 patch Transdermal Q72H   Continuous Infusions:  fluconazole (DIFLUCAN) IV     And   fluconazole (DIFLUCAN) IV     [START ON 10/30/2021] fluconazole (DIFLUCAN) IV     lactated ringers 125 mL/hr at 10/29/21 0959   meropenem (MERREM) IV 1 g (10/29/21 0546)   promethazine (PHENERGAN) injection (IM or IVPB)     vancomycin 750 mg (10/29/21 1203)     LOS: 1 day    Time spent: over 30 min    Fayrene Helper, MD [facility]   To contact the attending provider between 7A-7P or the covering provider during after hours 7P-7A, please log into the web  site [email or web address] and access using universal [place] password for that web site. If you do not have the password, please call the hospital operator.  10/29/2021, 12:54 PM

## 2021-10-29 NOTE — Progress Notes (Signed)
Pharmacy Antibiotic Note  Jill Rodriguez is a 61 y.o. female admitted on 10/28/2021 with  febrile neutropenia .  Pharmacy has been consulted for Vanc, meropenem dosing.  Plan: Continue Meropenem 1 gm IV Q8H ordered to start on 7/4 @ 2300.  Adjust Vancomycin to 750 mg IV Q12H  Goal AUC 400-600 Estimated AUC 520/Cmin 16.2 IBW, Scr used 0.85, Vd 0.72 MRSA PCR ordered   Height: '5\' 4"'$  (162.6 cm) Weight: 73 kg (160 lb 15 oz) IBW/kg (Calculated) : 54.7  Temp (24hrs), Avg:99.3 F (37.4 C), Min:98.5 F (36.9 C), Max:99.8 F (37.7 C)  Recent Labs  Lab 10/28/21 1531 10/28/21 1851 10/28/21 2025 10/29/21 0554  WBC 1.0*  1.0*  --   --  1.0*  1.0*  CREATININE 1.10*  --   --  0.85  LATICACIDVEN  --  1.3 0.7  --      Estimated Creatinine Clearance: 68.9 mL/min (by C-G formula based on SCr of 0.85 mg/dL).    No Known Allergies  Antimicrobials this admission: 7/4 meropenem  >>  7/4 Vancomycin  >>   Dose adjustments this admission: 7/5: Vancomycin 750 Q24 > Q12H  Microbiology results:  7/4: BCx: pending yeast  7/4 UCx:  ordered add-on 7/5 (not ordered originally)  MRSA PCR: ordered  Thank you for allowing pharmacy to be a part of this patient's care.  Dorothe Pea, PharmD, BCPS Clinical Pharmacist   10/29/2021 10:30 AM

## 2021-10-29 NOTE — Progress Notes (Signed)
PHARMACY - PHYSICIAN COMMUNICATION CRITICAL VALUE ALERT - BLOOD CULTURE IDENTIFICATION (BCID)  Jill Rodriguez is an 61 y.o. female who presented to [facility] on 10/28/2021 with a chief complaint of fever, N/V  Assessment:  Patient on chemotherapy.  BLood culture from 7/4 with yeast growing in Bcx drawn from port, peripheral bcx no growth to date.  BCID = C albicans  Name of physician (or Provider) Contacted: Drs Delaine Lame and Florene Glen  Current antibiotics: vancomycin and meropenem  Changes to prescribed antibiotics recommended:  Recommendations accepted by provider  Results for orders placed or performed during the hospital encounter of 10/28/21  Blood Culture ID Panel (Reflexed) (Collected: 10/28/2021  8:25 PM)  Result Value Ref Range   Enterococcus faecalis NOT DETECTED NOT DETECTED   Enterococcus Faecium NOT DETECTED NOT DETECTED   Listeria monocytogenes NOT DETECTED NOT DETECTED   Staphylococcus species NOT DETECTED NOT DETECTED   Staphylococcus aureus (BCID) NOT DETECTED NOT DETECTED   Staphylococcus epidermidis NOT DETECTED NOT DETECTED   Staphylococcus lugdunensis NOT DETECTED NOT DETECTED   Streptococcus species NOT DETECTED NOT DETECTED   Streptococcus agalactiae NOT DETECTED NOT DETECTED   Streptococcus pneumoniae NOT DETECTED NOT DETECTED   Streptococcus pyogenes NOT DETECTED NOT DETECTED   A.calcoaceticus-baumannii NOT DETECTED NOT DETECTED   Bacteroides fragilis NOT DETECTED NOT DETECTED   Enterobacterales NOT DETECTED NOT DETECTED   Enterobacter cloacae complex NOT DETECTED NOT DETECTED   Escherichia coli NOT DETECTED NOT DETECTED   Klebsiella aerogenes NOT DETECTED NOT DETECTED   Klebsiella oxytoca NOT DETECTED NOT DETECTED   Klebsiella pneumoniae NOT DETECTED NOT DETECTED   Proteus species NOT DETECTED NOT DETECTED   Salmonella species NOT DETECTED NOT DETECTED   Serratia marcescens NOT DETECTED NOT DETECTED   Haemophilus influenzae NOT DETECTED NOT DETECTED    Neisseria meningitidis NOT DETECTED NOT DETECTED   Pseudomonas aeruginosa NOT DETECTED NOT DETECTED   Stenotrophomonas maltophilia NOT DETECTED NOT DETECTED   Candida albicans DETECTED (A) NOT DETECTED   Candida auris NOT DETECTED NOT DETECTED   Candida glabrata NOT DETECTED NOT DETECTED   Candida krusei NOT DETECTED NOT DETECTED   Candida parapsilosis NOT DETECTED NOT DETECTED   Candida tropicalis NOT DETECTED NOT DETECTED   Cryptococcus neoformans/gattii NOT DETECTED NOT DETECTED   Doreene Eland, PharmD, BCPS, BCIDP Work Cell: [phone] 10/29/2021 12:48 PM

## 2021-10-29 NOTE — Progress Notes (Signed)
Patient received room the ED and admitted to room 107 with spouse at the bedside. [place] interpreter, spoke to [place] who translated. Patient oriented to staff and call bell. Patient stated she was too tired to answer the admission questions. The spouse declined to answer on the wife's behalf. Head-to-toe assessment completed. Call bell kept within reach. Bed alarm on and functioning without any difficulties.

## 2021-10-29 NOTE — Hospital Course (Signed)
Jill Rodriguez is Jill Rodriguez 61 y.o. 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 coli UTI sensitive only to ertapenem and nitrofurantoin, with last chemotherapy infusion on 6/23 who presents to the ED with intractable nausea and vomiting that started 2 days prior.   During her last chemotherapy session she developed acute nausea and vomiting that was treated with Compazine.  Her nausea and vomiting was managed for the first few days with dexamethasone, Marinol, Zofran, Phenergan suppository and scopolamine patch, however 2 days ago she developed intractable vomiting associated with weakness.  According to her husband at the bedside, patient has been having bouts of coughing for the past several weeks and now every time she has Jill Rodriguez bout of coughing she vomits.  He feels like this happens every 4-6 hours over the past two days.  She has not had shortness of breath, chest pain or fever.  She's been admitted with nausea/vomiting, neutropenia.  She's now been shown to have candidemia.  Currently on broad spectrum antiinfectives.  ID and oncology consulted.  She has markedly abnormal EKG, cardiology c/s pending.   See below for additional details

## 2021-10-30 ENCOUNTER — Encounter: Payer: Self-pay | Admitting: Internal Medicine

## 2021-10-30 ENCOUNTER — Inpatient Hospital Stay: Payer: BLUE CROSS/BLUE SHIELD

## 2021-10-30 ENCOUNTER — Inpatient Hospital Stay: Payer: BLUE CROSS/BLUE SHIELD | Admitting: Radiology

## 2021-10-30 DIAGNOSIS — C7802 Secondary malignant neoplasm of left lung: Secondary | ICD-10-CM

## 2021-10-30 DIAGNOSIS — R930 Abnormal findings on diagnostic imaging of skull and head, not elsewhere classified: Secondary | ICD-10-CM

## 2021-10-30 DIAGNOSIS — J3800 Paralysis of vocal cords and larynx, unspecified: Secondary | ICD-10-CM | POA: Diagnosis not present

## 2021-10-30 DIAGNOSIS — D709 Neutropenia, unspecified: Secondary | ICD-10-CM

## 2021-10-30 DIAGNOSIS — R5081 Fever presenting with conditions classified elsewhere: Secondary | ICD-10-CM

## 2021-10-30 DIAGNOSIS — N179 Acute kidney failure, unspecified: Secondary | ICD-10-CM | POA: Diagnosis not present

## 2021-10-30 DIAGNOSIS — R9431 Abnormal electrocardiogram [ECG] [EKG]: Secondary | ICD-10-CM | POA: Diagnosis not present

## 2021-10-30 DIAGNOSIS — J189 Pneumonia, unspecified organism: Secondary | ICD-10-CM

## 2021-10-30 DIAGNOSIS — C7801 Secondary malignant neoplasm of right lung: Secondary | ICD-10-CM

## 2021-10-30 DIAGNOSIS — R8281 Pyuria: Secondary | ICD-10-CM

## 2021-10-30 DIAGNOSIS — B377 Candidal sepsis: Secondary | ICD-10-CM

## 2021-10-30 DIAGNOSIS — D701 Agranulocytosis secondary to cancer chemotherapy: Secondary | ICD-10-CM | POA: Diagnosis not present

## 2021-10-30 DIAGNOSIS — Z515 Encounter for palliative care: Secondary | ICD-10-CM

## 2021-10-30 DIAGNOSIS — D61810 Antineoplastic chemotherapy induced pancytopenia: Secondary | ICD-10-CM

## 2021-10-30 DIAGNOSIS — R112 Nausea with vomiting, unspecified: Secondary | ICD-10-CM | POA: Diagnosis not present

## 2021-10-30 HISTORY — PX: IR REMOVAL TUN ACCESS W/ PORT W/O FL MOD SED: IMG2290

## 2021-10-30 LAB — CBC WITH DIFFERENTIAL/PLATELET
Abs Immature Granulocytes: 0.01 10*3/uL (ref 0.00–0.07)
Abs Immature Granulocytes: 0.1 10*3/uL — ABNORMAL HIGH (ref 0.00–0.07)
Basophils Absolute: 0 10*3/uL (ref 0.0–0.1)
Basophils Absolute: 0 10*3/uL (ref 0.0–0.1)
Basophils Relative: 1 %
Basophils Relative: 1 %
Eosinophils Absolute: 0 10*3/uL (ref 0.0–0.5)
Eosinophils Absolute: 0 10*3/uL (ref 0.0–0.5)
Eosinophils Relative: 0 %
Eosinophils Relative: 0 %
HCT: 24 % — ABNORMAL LOW (ref 36.0–46.0)
HCT: 23.4 % — ABNORMAL LOW (ref 36.0–46.0)
Hemoglobin: 8.1 g/dL — ABNORMAL LOW (ref 12.0–15.0)
Hemoglobin: 7.9 g/dL — ABNORMAL LOW (ref 12.0–15.0)
Immature Granulocytes: 0 %
Immature Granulocytes: 3 %
Lymphocytes Relative: 19 %
Lymphocytes Relative: 10 %
Lymphs Abs: 0.5 10*3/uL — ABNORMAL LOW (ref 0.7–4.0)
Lymphs Abs: 0.4 10*3/uL — ABNORMAL LOW (ref 0.7–4.0)
MCH: 30.9 pg (ref 26.0–34.0)
MCH: 30.7 pg (ref 26.0–34.0)
MCHC: 33.8 g/dL (ref 30.0–36.0)
MCHC: 33.8 g/dL (ref 30.0–36.0)
MCV: 91.6 fL (ref 80.0–100.0)
MCV: 91.1 fL (ref 80.0–100.0)
Monocytes Absolute: 0.2 10*3/uL (ref 0.1–1.0)
Monocytes Absolute: 0.3 10*3/uL (ref 0.1–1.0)
Monocytes Relative: 9 %
Monocytes Relative: 7 %
Neutro Abs: 1.8 10*3/uL (ref 1.7–7.7)
Neutro Abs: 3 10*3/uL (ref 1.7–7.7)
Neutrophils Relative %: 71 %
Neutrophils Relative %: 79 %
Platelets: 43 10*3/uL — ABNORMAL LOW (ref 150–400)
Platelets: 39 10*3/uL — ABNORMAL LOW (ref 150–400)
RBC: 2.62 MIL/uL — ABNORMAL LOW (ref 3.87–5.11)
RBC: 2.57 MIL/uL — ABNORMAL LOW (ref 3.87–5.11)
RDW: 14.5 % (ref 11.5–15.5)
RDW: 14.6 % (ref 11.5–15.5)
Smear Review: NORMAL
WBC: 2.5 10*3/uL — ABNORMAL LOW (ref 4.0–10.5)
WBC: 3.8 10*3/uL — ABNORMAL LOW (ref 4.0–10.5)
nRBC: 0 % (ref 0.0–0.2)
nRBC: 0 % (ref 0.0–0.2)

## 2021-10-30 LAB — COMPREHENSIVE METABOLIC PANEL
ALT: 30 U/L (ref 0–44)
AST: 24 U/L (ref 15–41)
Albumin: 2.9 g/dL — ABNORMAL LOW (ref 3.5–5.0)
Alkaline Phosphatase: 64 U/L (ref 38–126)
Anion gap: 6 (ref 5–15)
BUN: 14 mg/dL (ref 6–20)
CO2: 26 mmol/L (ref 22–32)
Calcium: 8.4 mg/dL — ABNORMAL LOW (ref 8.9–10.3)
Chloride: 102 mmol/L (ref 98–111)
Creatinine, Ser: 0.85 mg/dL (ref 0.44–1.00)
GFR, Estimated: >60 mL/min (ref >60)
Glucose, Bld: 90 mg/dL (ref 70–99)
Potassium: 3.3 mmol/L — ABNORMAL LOW (ref 3.5–5.1)
Sodium: 134 mmol/L — ABNORMAL LOW (ref 135–145)
Total Bilirubin: 1 mg/dL (ref 0.3–1.2)
Total Protein: 6.1 g/dL — ABNORMAL LOW (ref 6.5–8.1)

## 2021-10-30 LAB — PHOSPHORUS: Phosphorus: 2.6 mg/dL (ref 2.5–4.6)

## 2021-10-30 LAB — LEGIONELLA PNEUMOPHILA SEROGP 1 UR AG: L. pneumophila Serogp 1 Ur Ag: NEGATIVE

## 2021-10-30 LAB — MAGNESIUM: Magnesium: 1.4 mg/dL — ABNORMAL LOW (ref 1.7–2.4)

## 2021-10-30 MED ORDER — MAGNESIUM SULFATE 2 GM/50ML IV SOLN
2.0000 g | Freq: Once | INTRAVENOUS | Status: AC
  Administered 2021-10-30: 2 g via INTRAVENOUS
  Filled 2021-10-30: qty 50

## 2021-10-30 MED ORDER — IOHEXOL 300 MG/ML  SOLN
75.0000 mL | Freq: Once | INTRAMUSCULAR | Status: AC | PRN
  Administered 2021-10-30: 75 mL via INTRAVENOUS

## 2021-10-30 MED ORDER — POTASSIUM CHLORIDE CRYS ER 20 MEQ PO TBCR
40.0000 meq | EXTENDED_RELEASE_TABLET | ORAL | Status: AC
  Administered 2021-10-30: 40 meq via ORAL
  Filled 2021-10-30: qty 2

## 2021-10-30 MED ORDER — MIDAZOLAM HCL 2 MG/2ML IJ SOLN
INTRAMUSCULAR | Status: AC | PRN
  Administered 2021-10-30: 1 mg via INTRAVENOUS

## 2021-10-30 MED ORDER — SODIUM CHLORIDE 0.9 % IV SOLN: INTRAVENOUS | Status: DC

## 2021-10-30 MED ORDER — POTASSIUM CHLORIDE 2 MEQ/ML IV SOLN
75.0000 mL/h | INTRAVENOUS | Status: DC
  Administered 2021-10-30: 75 mL/h via INTRAVENOUS
  Filled 2021-10-30 (×3): qty 1000

## 2021-10-30 MED ORDER — FENTANYL CITRATE (PF) 100 MCG/2ML IJ SOLN
INTRAMUSCULAR | Status: AC | PRN
  Administered 2021-10-30: 50 ug via INTRAVENOUS

## 2021-10-30 MED ORDER — MIDAZOLAM HCL 2 MG/2ML IJ SOLN
INTRAMUSCULAR | Status: AC
  Filled 2021-10-30: qty 2

## 2021-10-30 MED ORDER — LORAZEPAM 0.5 MG PO TABS
0.5000 mg | ORAL_TABLET | Freq: Three times a day (TID) | ORAL | Status: DC
  Administered 2021-10-30 – 2021-11-10 (×32): 0.5 mg via ORAL
  Filled 2021-10-30 (×34): qty 1

## 2021-10-30 MED ORDER — LIDOCAINE-EPINEPHRINE 1 %-1:100000 IJ SOLN
INTRAMUSCULAR | Status: AC
  Administered 2021-10-30: 4 mL
  Filled 2021-10-30: qty 1

## 2021-10-30 MED ORDER — FENTANYL CITRATE (PF) 100 MCG/2ML IJ SOLN
INTRAMUSCULAR | Status: AC
  Filled 2021-10-30: qty 2

## 2021-10-30 NOTE — Progress Notes (Signed)
Date of Admission:  10/28/2021     ID: Jill Rodriguez is a 61 y.o. female  Principal Problem:   Chemotherapy induced nausea and vomiting Active Problems:   Chemotherapy-induced neutropenia (HCC)   Neutropenic fever (HCC)   Pyuria   AKI (acute kidney injury) ([place])   Pancytopenia due to antineoplastic chemotherapy (HCC)   HCAP (healthcare-associated pneumonia)   Candidemia ([place])   Abnormal EKG   Abnormal CT of the head   Vocal cord paralysis    Subjective: Pt sleeping after coming back from IR- port removed ENT evaluated her- left vocal cord paralysis Husband at bed side Medications:   Chlorhexidine Gluconate Cloth  6 each Topical Q0600   feeding supplement  1 Container Oral TID BM   fentaNYL       LORazepam  0.5 mg Oral TID   midazolam       multivitamin with minerals  1 tablet Oral Daily   mouth rinse  15 mL Mouth Rinse 4 times per day   scopolamine  1 patch Transdermal Q72H   Tbo-Filgrastim  300 mcg Subcutaneous Daily    Objective: Vital signs in last 24 hours: Temp:  [99 F (37.2 C)-100 F (37.8 C)] 99.9 F (37.7 C) (07/06 1619) Pulse Rate:  [93-107] 101 (07/06 1619) Resp:  [12-22] 18 (07/06 1619) BP: (136-162)/(65-87) 159/83 (07/06 1619) SpO2:  [92 %-99 %] 94 % (07/06 1619)  LDA Foley Central lines Other catheters  PHYSICAL EXAM:  Not examned  Lab Results Recent Labs    10/29/21 0554 10/30/21 0514  WBC 1.0*  1.0* 2.5*  HGB 7.8*  7.9* 8.1*  HCT 23.3*  23.6* 24.0*  NA 137 134*  K 3.4* 3.3*  CL 104 102  CO2 28 26  BUN 18 14  CREATININE 0.85 0.85   Liver Panel Recent Labs    10/28/21 1531 10/30/21 0514  PROT 6.4* 6.1*  ALBUMIN 3.3* 2.9*  AST 30 24  ALT 42 30  ALKPHOS 68 64  BILITOT 0.8 1.0  BILIDIR 0.2  --   IBILI 0.6  --    Sedimentation Rate No results for input(s): "ESRSEDRATE" in the last 72 hours. C-Reactive Protein No results for input(s): "CRP" in the last 72 hours.  Microbiology:  Studies/Results: CT CHEST W  CONTRAST  Result Date: 10/30/2021 CLINICAL DATA:  Vocal cord paralysis, history of breast cancer with pulmonary metastasis; * Tracking Code: BO * EXAM: CT CHEST WITH CONTRAST TECHNIQUE: Multidetector CT imaging of the chest was performed during intravenous contrast administration. RADIATION DOSE REDUCTION: This exam was performed according to the departmental dose-optimization program which includes automated exposure control, adjustment of the mA and/or kV according to patient size and/or use of iterative reconstruction technique. CONTRAST:  44m OMNIPAQUE IOHEXOL 300 MG/ML  SOLN COMPARISON:  Chest CT dated August 10, 2021 FINDINGS: Cardiovascular: Normal heart size. No pericardial effusion. No coronary artery calcifications. Normal caliber thoracic aorta with mild atherosclerotic disease. Mediastinum/Nodes: Dilated esophagus with air-fluid level. Mildly heterogeneous thyroid gland which is decreased in size when compared with prior CT. No pathologically enlarged lymph nodes seen in the chest. Lungs/Pleura: Central airways are patent. Large left pleural effusion with atelectasis. Bilateral solid pulmonary nodules are decreased or no longer present, although portions of the left lung are not visualized due to large pleural effusion which somewhat limits evaluation. Reference subpleural nodule of the left upper lobe measuring 5 mm on series 4, image 17, previously measured 9 mm. Upper Abdomen: Cholelithiasis.  No acute abnormality. Musculoskeletal:  Soft tissue gas of the right anterior chest wall, compatible with history of recent port removal. No acute or significant osseous findings. IMPRESSION: 1. Large left pleural effusion with associated atelectasis. 2. Bilateral solid pulmonary nodules are decreased in size or no longer present. 3. Dilated esophagus with air-fluid level, findings can be seen in the setting of esophageal dysmotility. 4. Mildly heterogeneous thyroid gland, decreased in size when compared with  prior chest CT. 5. Aortic Atherosclerosis (ICD10-I70.0). Electronically Signed   By: Yetta Glassman M.D.   On: 10/30/2021 16:40   CT SOFT TISSUE NECK W CONTRAST  Result Date: 10/30/2021 CLINICAL DATA:  Vocal cord paralysis, history of breast cancer EXAM: CT NECK WITH CONTRAST TECHNIQUE: Multidetector CT imaging of the neck was performed using the standard protocol following the bolus administration of intravenous contrast. RADIATION DOSE REDUCTION: This exam was performed according to the departmental dose-optimization program which includes automated exposure control, adjustment of the mA and/or kV according to patient size and/or use of iterative reconstruction technique. CONTRAST:  83m OMNIPAQUE IOHEXOL 300 MG/ML  SOLN COMPARISON:  Correlation made with CTA neck from 2021 FINDINGS: Pharynx and larynx: No mass or swelling. Appearance suggestive of left vocal cord weakness or paralysis. Salivary glands: Parotid and submandibular glands are unremarkable. Thyroid: Unremarkable. Lymph nodes: A mildly enlarged left supraclavicular node was not present on the prior study. This measures 1.2 cm on series 3, image 74. Vascular: Major neck vessels are patent. Partially retropharyngeal course of the ICAs. Limited intracranial: No abnormal enhancement. 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 Visualized orbits: Unremarkable. Mastoids and visualized paranasal sinuses: No significant opacification. Skeleton: No acute osseous abnormality. Upper chest: Postprocedural changes of same day chest port removal. Refer to concurrent dedicated chest CT. Other: None. IMPRESSION: Possible left vocal cord weakness or paralysis. Correlate with clinical exam. Indeterminate mildly enlarged left supraclavicular lymph node. Electronically Signed   By: PMacy MisM.D.   On: 10/30/2021 16:33   IR REMOVAL TUN ACCESS W/ PORT W/O FL MOD SED  Result Date: 10/30/2021 INDICATION: Port removal, infection EXAM: Port removal MEDICATIONS: Per EMR  ANESTHESIA/SEDATION: Moderate (conscious) sedation was employed during this procedure. A total of Versed 1 mg and Fentanyl 50 mcg was administered intravenously. Moderate Sedation Time: 12 minutes. The patient's level of consciousness and vital signs were monitored continuously by radiology nursing throughout the procedure under my direct supervision. FLUOROSCOPY TIME:  N/a COMPLICATIONS: None immediate. PROCEDURE: Informed written consent was obtained from the patient after a thorough discussion of the procedural risks, benefits and alternatives. All questions were addressed. Maximal Sterile Barrier Technique was utilized including caps, mask, sterile gowns, sterile gloves, sterile drape, hand hygiene and skin antiseptic. A timeout was performed prior to the initiation of the procedure. The right chest was prepped and draped in the standard sterile fashion. Lidocaine was utilized for local anesthesia. An incision was made over the previously healed surgical incision. Utilizing blunt dissection, the port catheter and reservoir were removed from the underlying subcutaneous tissue in their entirety. After hemostasis, the subcutaneous pockets was closed in two layers using 3-0 and 4-0 Vicryl. Dermabond was also applied. The patient tolerated the procedure well without immediate complication. IMPRESSION: Successful removal of right-sided chest port. The catheter tip and port were sent for microbiology culture. Electronically Signed   By: YAlbin FellingM.D.   On: 10/30/2021 16:14   MR BRAIN WO CONTRAST  Result Date: 10/29/2021 CLINICAL DATA:  Headache EXAM: MRI HEAD WITHOUT CONTRAST TECHNIQUE: Multiplanar, multiecho pulse sequences of the brain  and surrounding structures were obtained without intravenous contrast. COMPARISON:  None Available. FINDINGS: Brain: No acute infarct, mass effect or extra-axial collection. No acute or chronic hemorrhage. There is multifocal hyperintense T2-weighted signal within the white  matter. Parenchymal volume and CSF spaces are normal. The midline structures are normal. Vascular: Major flow voids are preserved. Skull and upper cervical spine: Normal calvarium and skull base. Visualized upper cervical spine and soft tissues are normal. Sinuses/Orbits:No paranasal sinus fluid levels or advanced mucosal thickening. No mastoid or middle ear effusion. Normal orbits. IMPRESSION: 1. No acute intracranial abnormality. 2. Multifocal hyperintense T2-weighted signal within the white matter, most commonly seen in the setting of chronic small vessel ischemia. Electronically Signed   By: Ulyses Jarred M.D.   On: 10/29/2021 22:17   ECHOCARDIOGRAM COMPLETE  Result Date: 10/29/2021    ECHOCARDIOGRAM REPORT   Patient Name:   Jill Rodriguez Date of Exam: 10/29/2021 Medical Rec #:  [id number]           Height:       64.0 in Accession #:    [id number]          Weight:       160.9 lb Date of Birth:  Mar 03, 1961           BSA:          1.784 m Patient Age:    77 years            BP:           151/76 mmHg Patient Gender: F                   HR:           90 bpm. Exam Location:  [facility] Procedure: 2D Echo, Color Doppler and Cardiac Doppler Indications:     R00.8 Other abnormalities of the heart  History:         Patient has no prior history of Echocardiogram examinations.                  Signs/Symptoms:Candidemia.  Sonographer:     Charmayne Sheer Referring Phys:  [id number] CALDWELL POWELL JR Diagnosing Phys: Kate Sable MD  Sonographer Comments: Suboptimal parasternal window and suboptimal subcostal window. IMPRESSIONS  1. Left ventricular ejection fraction, by estimation, is 50 to 55%. The left ventricle has low normal function. The left ventricle has no regional wall motion abnormalities. There is mild left ventricular hypertrophy. Left ventricular diastolic parameters are consistent with Grade I diastolic dysfunction (impaired relaxation).  2. Right ventricular systolic function is normal. The right ventricular  size is normal.  3. The mitral valve is normal in structure. No evidence of mitral valve regurgitation.  4. The aortic valve was not well visualized. Aortic valve regurgitation is not visualized.  5. The inferior vena cava is normal in size with greater than 50% respiratory variability, suggesting right atrial pressure of 3 mmHg. FINDINGS  Left Ventricle: Left ventricular ejection fraction, by estimation, is 50 to 55%. The left ventricle has low normal function. The left ventricle has no regional wall motion abnormalities. The left ventricular internal cavity size was normal in size. There is mild left ventricular hypertrophy. Left ventricular diastolic parameters are consistent with Grade I diastolic dysfunction (impaired relaxation). Right Ventricle: The right ventricular size is normal. No increase in right ventricular wall thickness. Right ventricular systolic function is normal. Left Atrium: Left atrial size was normal in size. Right Atrium: Right atrial size was normal  in size. Pericardium: There is no evidence of pericardial effusion. Mitral Valve: The mitral valve is normal in structure. No evidence of mitral valve regurgitation. Tricuspid Valve: The tricuspid valve is not well visualized. Tricuspid valve regurgitation is not demonstrated. Aortic Valve: The aortic valve was not well visualized. Aortic valve regurgitation is not visualized. Aortic valve mean gradient measures 3.0 mmHg. Aortic valve peak gradient measures 6.6 mmHg. Aortic valve area, by VTI measures 2.27 cm. Pulmonic Valve: The pulmonic valve was not well visualized. Pulmonic valve regurgitation is not visualized. Aorta: The aortic root and ascending aorta are structurally normal, with no evidence of dilitation. Venous: The inferior vena cava is normal in size with greater than 50% respiratory variability, suggesting right atrial pressure of 3 mmHg. IAS/Shunts: No atrial level shunt detected by color flow Doppler.  LEFT VENTRICLE PLAX 2D  LVIDd:         3.00 cm   Diastology LVIDs:         2.51 cm   LV e' medial:    4.68 cm/s LV PW:         1.12 cm   LV E/e' medial:  15.0 LV IVS:        0.73 cm   LV e' lateral:   5.22 cm/s LVOT diam:     1.90 cm   LV E/e' lateral: 13.4 LV SV:         48 LV SV Index:   27 LVOT Area:     2.84 cm  RIGHT VENTRICLE RV Basal diam:  2.46 cm LEFT ATRIUM             Index        RIGHT ATRIUM          Index LA diam:        3.20 cm 1.79 cm/m   RA Area:     8.72 cm LA Vol (A2C):   24.0 ml 13.45 ml/m  RA Volume:   16.10 ml 9.03 ml/m LA Vol (A4C):   59.9 ml 33.58 ml/m LA Biplane Vol: 41.9 ml 23.49 ml/m  AORTIC VALVE                    PULMONIC VALVE AV Area (Vmax):    2.26 cm     PV Vmax:       0.99 m/s AV Area (Vmean):   2.33 cm     PV Peak grad:  3.9 mmHg AV Area (VTI):     2.27 cm AV Vmax:           128.00 cm/s AV Vmean:          83.700 cm/s AV VTI:            0.212 m AV Peak Grad:      6.6 mmHg AV Mean Grad:      3.0 mmHg LVOT Vmax:         102.00 cm/s LVOT Vmean:        68.700 cm/s LVOT VTI:          0.170 m LVOT/AV VTI ratio: 0.80  AORTA Ao Root diam: 3.40 cm MITRAL VALVE MV Area (PHT): 10.18 cm    SHUNTS MV Decel Time: 75 msec      Systemic VTI:  0.17 m MV E velocity: 70.05 cm/s   Systemic Diam: 1.90 cm MV A velocity: 117.50 cm/s MV E/A ratio:  0.60 Kate Sable MD Electronically signed by Kate Sable MD Signature Date/Time: 10/29/2021/3:32:34 PM  Final      Assessment/Plan:  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  Palliative consult in progress  Discussed the management with her husband.

## 2021-10-30 NOTE — Procedures (Signed)
Interventional Radiology Procedure Note  Date of Procedure: 10/30/2021  Procedure: Port removal   Findings:  1. Port removal    Complications: No immediate complications noted.   Estimated Blood Loss: minimal  Follow-up and Recommendations: 1. Bedrest 1hour  2. Follow up cultures    Albin Felling, MD  Vascular & Interventional Radiology  10/30/2021 3:49 PM

## 2021-10-30 NOTE — Progress Notes (Signed)
Patient clinically stable post Port removal per Dr Denna Haggard, tolerated well. Vtials stable pre and post procedure. Received Versed 1 mg along with Fentanyl 50 mcg IV for procedure. Report given at bedside to [facility] 10 post procedure/recovery

## 2021-10-30 NOTE — Consult Note (Addendum)
Hematology/Oncology Consult note [phone] [phone]      Patient Care Team: Pcp, No as PCP - General Clent Jacks, RN as Oncology Nurse Navigator Earlie Server, MD as Consulting Physician (Hematology and Oncology) Mellody Drown, MD as Consulting Physician (Gynecologic Oncology) Shirline Frees, MD as Consulting Physician (Radiation Oncology) Noreene Filbert, MD as Consulting Physician (Radiation Oncology)   Name of the patient: Jill Rodriguez  [id number]  December 20, 1960   Date of visit: 10/30/21 REASON FOR COSULTATION:  Neutropenic fever, consulted by Dr. Fayrene Helper. History of presenting illness-  61 y.o. female with PMH listed at below who presents to ER for evaluation of generalized weakness, nausea vomiting after her last chemotherapy.  Husband at bedside who provides the history.  Patient has had a chronic cough for couple of months.  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 I reviewed her recent oncology records at [facility] via care everywhere. She has also had recent multiple hospitalization at [facility] due to intractable nausea vomiting as well as E. coli UTI.   Last chemotherapy was on 10/17/2021, she received carboplatin/Taxol/bevacizumab. Patient has a Mediport. She has had intractable nausea vomiting despite utilizing her home antiemetics including Zofran, dexamethasone, olanzapine, compazine, Phenergan suppository ,  scopolamine patch.  Patient has also had cough which triggers for vomiting. + Headache no reported fever, chills  10/28/2021 her initial labs showed leukopenia with total WBC 1.0 ANC 0.1.  Hemoglobin 8.7, MCV 93.2.  Platelet count 61,000.  Blood culture positive for Candida albicans, UA is positive urine culture is pending. 10/28/2021, chest x-ray showed low lung volumes with chronic elevation of left hemidiaphragm.  Dense left  basilar opacification may reflect atelectasis, consolidation and/or effusion.  CT head without contrast showed no evidence of acute intracranial abnormality. 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  MRI brain without contrast showed no acute intra cranial abnormality. Patient was admitted for neutropenic fever, UTI.  Possible HCAP.     Review of Systems  Constitutional:  Positive for appetite change and fatigue.  Eyes:  Negative for icterus.  Respiratory:  Positive for cough.   Gastrointestinal:  Positive for nausea and vomiting.  Genitourinary:  Negative for difficulty urinating.   Musculoskeletal:  Positive for arthralgias.  Skin:  Negative for rash.  Neurological:  Positive for headaches.  Hematological:  Negative for adenopathy.  Psychiatric/Behavioral:  Negative for confusion.     No Known Allergies  Patient Active Problem List   Diagnosis Date Noted   Candidemia ([place]) 10/29/2021   Abnormal EKG 10/29/2021   Abnormal CT of the head 10/29/2021   Neutropenic fever ([place]) 10/28/2021   Pyuria 10/28/2021   AKI (acute kidney injury) ([place]) 10/28/2021   Pancytopenia due to antineoplastic chemotherapy ([place]) 10/28/2021   HCAP (healthcare-associated pneumonia) 10/28/2021   Cancer with pulmonary metastases ([place]) 08/27/2021   Chronic cough 07/25/2021   Dysuria 07/31/2020   Chemotherapy induced neutropenia ([place]) 06/14/2020   Hypocalcemia 06/14/2020   Normocytic anemia 06/14/2020   Thoracic lymphadenopathy 05/20/2020   Chemotherapy induced nausea and vomiting 10/31/2019   Heartburn 10/17/2019   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 ([place]) 10/10/2019   Encounter for antineoplastic chemotherapy 10/10/2019   Non-intractable vomiting 10/10/2019   Thrush 10/10/2019   Goals of care, counseling/discussion 09/20/2019   Malignant neoplasm of cervix ([place]) 08/30/2019     Past Medical History:  Diagnosis Date   Cancer ([place])  cervical CA, that spread to  lymph nodes; last chemo was 12/08/2019; radiation 01/2020   COVID-19 10/2018   Nausea with vomiting 10/31/2019   Neoplasm related pain    No pertinent past medical history      Past Surgical History:  Procedure Laterality Date   ESOPHAGOGASTRODUODENOSCOPY (EGD) WITH PROPOFOL N/A 03/28/2020   Procedure: ESOPHAGOGASTRODUODENOSCOPY (EGD) WITH PROPOFOL;  Surgeon: Milus Banister, MD;  Location: [facility];  Service: Endoscopy;  Laterality: N/A;   EUS N/A 03/28/2020   Procedure: UPPER ENDOSCOPIC ULTRASOUND (EUS) RADIAL;  Surgeon: Milus Banister, MD;  Location: [facility];  Service: Endoscopy;  Laterality: N/A;   no surgical history      Social History   Socioeconomic History   Marital status: Married    Spouse name: Rosezetta Schlatter    Number of children: 3   Years of education: Not on file   Highest education level: Not on file  Occupational History   Not on file  Tobacco Use   Smoking status: Never   Smokeless tobacco: Never  Vaping Use   Vaping Use: Never used  Substance and Sexual Activity   Alcohol use: Never   Drug use: Never   Sexual activity: Yes    Birth control/protection: None  Other Topics Concern   Not on file  Social History Narrative   Lives at home with spouse ; daughter [name] comes & stays when needed   Social Determinants of Health   Financial Resource Strain: Not on file  Food Insecurity: Not on file  Transportation Needs: Not on file  Physical Activity: Not on file  Stress: Not on file  Social Connections: Not on file  Intimate Partner Violence: Not on file     Family History  Problem Relation Age of Onset   Blindness Mother    Glaucoma Mother    Diabetes Father    Diabetes Brother      Current Facility-Administered Medications:    acetaminophen (TYLENOL) tablet 650 mg, 650 mg, Oral, Q6H PRN, 650 mg at 10/29/21 1123 **OR** acetaminophen (TYLENOL) suppository 650 mg, 650 mg, Rectal, Q6H PRN, Athena Masse, MD   Chlorhexidine Gluconate Cloth  2 % PADS 6 each, 6 each, Topical, Q0600, Foust, Katy L, NP, 6 each at 10/30/21 0513   feeding supplement (BOOST / RESOURCE BREEZE) liquid 1 Container, 1 Container, Oral, TID BM, Elodia Florence., MD, 1 Container at 10/29/21 2005   fluconazole (DIFLUCAN) IVPB 400 mg, 400 mg, Intravenous, Q24H, Powell, A Clint Lipps., MD   guaiFENesin-dextromethorphan (ROBITUSSIN DM) 100-10 MG/5ML syrup 5 mL, 5 mL, Oral, Q4H PRN, Elodia Florence., MD, 5 mL at 10/29/21 2155   lactated ringers infusion, , Intravenous, Continuous, Judd Gaudier V, MD, Last Rate: 125 mL/hr at 10/29/21 1956, New Bag at 10/29/21 1956   magnesium sulfate IVPB 2 g 50 mL, 2 g, Intravenous, Once, Jennye Boroughs, MD   morphine (PF) 2 MG/ML injection 2 mg, 2 mg, Intravenous, Q2H PRN, Athena Masse, MD, 2 mg at 10/30/21 0508   multivitamin with minerals tablet 1 tablet, 1 tablet, Oral, Daily, Elodia Florence., MD, 1 tablet at 10/29/21 1218   ondansetron (ZOFRAN) tablet 4 mg, 4 mg, Oral, Q6H PRN **OR** ondansetron (ZOFRAN) injection 4 mg, 4 mg, Intravenous, Q6H PRN, Athena Masse, MD, 4 mg at 10/29/21 2140   Oral care mouth rinse, 15 mL, Mouth Rinse, 4 times per day, Foust, Katy L, NP, 15 mL at 10/29/21 2143   Oral  care mouth rinse, 15 mL, Mouth Rinse, PRN, Foust, Katy L, NP   potassium chloride SA (KLOR-CON M) CR tablet 40 mEq, 40 mEq, Oral, Q4H, Jennye Boroughs, MD   promethazine (PHENERGAN) 12.5 mg in sodium chloride 0.9 % 50 mL IVPB, 12.5 mg, Intravenous, Q6H PRN, Athena Masse, MD, Last Rate: 200 mL/hr at 10/29/21 2305, 12.5 mg at 10/29/21 2305   scopolamine (TRANSDERM-SCOP) 1 MG/3DAYS 1.5 mg, 1 patch, Transdermal, Q72H, Judd Gaudier V, MD, 1.5 mg at 10/28/21 2337   Tbo-Filgrastim (GRANIX) injection 300 mcg, 300 mcg, Subcutaneous, Daily, Earlie Server, MD, 300 mcg at 10/29/21 2322   Physical exam:  Vitals:   10/29/21 1614 10/29/21 1952 10/30/21 0446 10/30/21 0735  BP: (!) 153/73 (!) 151/87 (!) 162/71 140/65  Pulse: 96 93  100 97  Resp:   18   Temp: 98.8 F (37.1 C) 99.9 F (37.7 C) 100 F (37.8 C) 99 F (37.2 C)  TempSrc:    Oral  SpO2: 98% 99% 97% 97%  Weight:      Height:       Physical Exam Constitutional:      General: She is not in acute distress.    Appearance: She is not diaphoretic.  HENT:     Head: Normocephalic and atraumatic.     Nose: Nose normal.     Mouth/Throat:     Pharynx: No oropharyngeal exudate.  Eyes:     General: No scleral icterus.    Pupils: Pupils are equal, round, and reactive to light.  Cardiovascular:     Rate and Rhythm: Normal rate and regular rhythm.     Heart sounds: No murmur heard. Pulmonary:     Effort: Pulmonary effort is normal. No respiratory distress.     Breath sounds: No wheezing.  Abdominal:     General: There is no distension.     Palpations: Abdomen is soft.     Tenderness: There is no abdominal tenderness.  Musculoskeletal:        General: Normal range of motion.     Cervical back: Normal range of motion and neck supple.  Skin:    General: Skin is warm and dry.     Coloration: Skin is pale.     Findings: No erythema.  Neurological:     Mental Status: She is alert and oriented to person, place, and time. Mental status is at baseline.     Motor: No abnormal muscle tone.  Psychiatric:        Mood and Affect: Mood and affect normal.     Labs    Latest Ref Rng & Units 10/30/2021    5:14 AM 10/29/2021    5:54 AM 10/28/2021    3:31 PM  CBC  WBC 4.0 - 10.5 K/uL 2.5  1.0    1.0  1.0    1.0   Hemoglobin 12.0 - 15.0 g/dL 8.1  7.9    7.8  8.8    8.7   Hematocrit 36.0 - 46.0 % 24.0  23.6    23.3  26.4    26.1   Platelets 150 - 400 K/uL 43  47    50  59    61       Latest Ref Rng & Units 10/30/2021    5:14 AM 10/29/2021    5:54 AM 10/28/2021    3:31 PM  CMP  Glucose 70 - 99 mg/dL 90  90  107   BUN 6 - 20 mg/dL 14  18  23   Creatinine 0.44 - 1.00 mg/dL 0.85  0.85  1.10   Sodium 135 - 145 mmol/L 134  137  134   Potassium 3.5 - 5.1 mmol/L  3.3  3.4  3.6   Chloride 98 - 111 mmol/L 102  104  99   CO2 22 - 32 mmol/L '26  28  28   '$ Calcium 8.9 - 10.3 mg/dL 8.4  8.4  8.7   Total Protein 6.5 - 8.1 g/dL 6.1   6.4   Total Bilirubin 0.3 - 1.2 mg/dL 1.0   0.8   Alkaline Phos 38 - 126 U/L 64   68   AST 15 - 41 U/L 24   30   ALT 0 - 44 U/L 30   42           RADIOGRAPHIC STUDIES: I have personally reviewed the radiological images as listed and agreed with the findings in the report. MR BRAIN WO CONTRAST  Result Date: 10/29/2021 CLINICAL DATA:  Headache EXAM: MRI HEAD WITHOUT CONTRAST TECHNIQUE: Multiplanar, multiecho pulse sequences of the brain and surrounding structures were obtained without intravenous contrast. COMPARISON:  None Available. FINDINGS: Brain: No acute infarct, mass effect or extra-axial collection. No acute or chronic hemorrhage. There is multifocal hyperintense T2-weighted signal within the white matter. Parenchymal volume and CSF spaces are normal. The midline structures are normal. Vascular: Major flow voids are preserved. Skull and upper cervical spine: Normal calvarium and skull base. Visualized upper cervical spine and soft tissues are normal. Sinuses/Orbits:No paranasal sinus fluid levels or advanced mucosal thickening. No mastoid or middle ear effusion. Normal orbits. IMPRESSION: 1. No acute intracranial abnormality. 2. Multifocal hyperintense T2-weighted signal within the white matter, most commonly seen in the setting of chronic small vessel ischemia. Electronically Signed   By: Ulyses Jarred M.D.   On: 10/29/2021 22:17   ECHOCARDIOGRAM COMPLETE  Result Date: 10/29/2021    ECHOCARDIOGRAM REPORT   Patient Name:   DANIALLE DEMENT Date of Exam: 10/29/2021 Medical Rec #:  [id number]           Height:       64.0 in Accession #:    [id number]          Weight:       160.9 lb Date of Birth:  01/11/1961           BSA:          1.784 m Patient Age:    17 years            BP:           151/76 mmHg Patient Gender: F                    HR:           90 bpm. Exam Location:  [facility] Procedure: 2D Echo, Color Doppler and Cardiac Doppler Indications:     R00.8 Other abnormalities of the heart  History:         Patient has no prior history of Echocardiogram examinations.                  Signs/Symptoms:Candidemia.  Sonographer:     Charmayne Sheer Referring Phys:  [id number] CALDWELL POWELL JR Diagnosing Phys: Kate Sable MD  Sonographer Comments: Suboptimal parasternal window and suboptimal subcostal window. IMPRESSIONS  1. Left ventricular ejection fraction, by estimation, is 50 to 55%. The left ventricle has low normal function. The left ventricle has no regional  wall motion abnormalities. There is mild left ventricular hypertrophy. Left ventricular diastolic parameters are consistent with Grade I diastolic dysfunction (impaired relaxation).  2. Right ventricular systolic function is normal. The right ventricular size is normal.  3. The mitral valve is normal in structure. No evidence of mitral valve regurgitation.  4. The aortic valve was not well visualized. Aortic valve regurgitation is not visualized.  5. The inferior vena cava is normal in size with greater than 50% respiratory variability, suggesting right atrial pressure of 3 mmHg. FINDINGS  Left Ventricle: Left ventricular ejection fraction, by estimation, is 50 to 55%. The left ventricle has low normal function. The left ventricle has no regional wall motion abnormalities. The left ventricular internal cavity size was normal in size. There is mild left ventricular hypertrophy. Left ventricular diastolic parameters are consistent with Grade I diastolic dysfunction (impaired relaxation). Right Ventricle: The right ventricular size is normal. No increase in right ventricular wall thickness. Right ventricular systolic function is normal. Left Atrium: Left atrial size was normal in size. Right Atrium: Right atrial size was normal in size. Pericardium: There is no evidence of pericardial  effusion. Mitral Valve: The mitral valve is normal in structure. No evidence of mitral valve regurgitation. Tricuspid Valve: The tricuspid valve is not well visualized. Tricuspid valve regurgitation is not demonstrated. Aortic Valve: The aortic valve was not well visualized. Aortic valve regurgitation is not visualized. Aortic valve mean gradient measures 3.0 mmHg. Aortic valve peak gradient measures 6.6 mmHg. Aortic valve area, by VTI measures 2.27 cm. Pulmonic Valve: The pulmonic valve was not well visualized. Pulmonic valve regurgitation is not visualized. Aorta: The aortic root and ascending aorta are structurally normal, with no evidence of dilitation. Venous: The inferior vena cava is normal in size with greater than 50% respiratory variability, suggesting right atrial pressure of 3 mmHg. IAS/Shunts: No atrial level shunt detected by color flow Doppler.  LEFT VENTRICLE PLAX 2D LVIDd:         3.00 cm   Diastology LVIDs:         2.51 cm   LV e' medial:    4.68 cm/s LV PW:         1.12 cm   LV E/e' medial:  15.0 LV IVS:        0.73 cm   LV e' lateral:   5.22 cm/s LVOT diam:     1.90 cm   LV E/e' lateral: 13.4 LV SV:         48 LV SV Index:   27 LVOT Area:     2.84 cm  RIGHT VENTRICLE RV Basal diam:  2.46 cm LEFT ATRIUM             Index        RIGHT ATRIUM          Index LA diam:        3.20 cm 1.79 cm/m   RA Area:     8.72 cm LA Vol (A2C):   24.0 ml 13.45 ml/m  RA Volume:   16.10 ml 9.03 ml/m LA Vol (A4C):   59.9 ml 33.58 ml/m LA Biplane Vol: 41.9 ml 23.49 ml/m  AORTIC VALVE                    PULMONIC VALVE AV Area (Vmax):    2.26 cm     PV Vmax:       0.99 m/s AV Area (Vmean):   2.33 cm  PV Peak grad:  3.9 mmHg AV Area (VTI):     2.27 cm AV Vmax:           128.00 cm/s AV Vmean:          83.700 cm/s AV VTI:            0.212 m AV Peak Grad:      6.6 mmHg AV Mean Grad:      3.0 mmHg LVOT Vmax:         102.00 cm/s LVOT Vmean:        68.700 cm/s LVOT VTI:          0.170 m LVOT/AV VTI ratio: 0.80   AORTA Ao Root diam: 3.40 cm MITRAL VALVE MV Area (PHT): 10.18 cm    SHUNTS MV Decel Time: 75 msec      Systemic VTI:  0.17 m MV E velocity: 70.05 cm/s   Systemic Diam: 1.90 cm MV A velocity: 117.50 cm/s MV E/A ratio:  0.60 Kate Sable MD Electronically signed by Kate Sable MD Signature Date/Time: 10/29/2021/3:32:34 PM    Final    DG Chest Portable 1 View  Result Date: 10/28/2021 CLINICAL DATA:  Cough EXAM: PORTABLE CHEST 1 VIEW COMPARISON:  08/10/2021 FINDINGS: Interval placement of a right-sided chest port with distal tip terminating near the level of the superior cavoatrial junction. Low lung volumes with chronic elevation of the left hemidiaphragm. Heart size and mediastinal contours are largely obscured. Dense left basilar opacification may reflect atelectasis, consolidation, and or effusion. No pneumothorax. IMPRESSION: Low lung volumes with chronic elevation of the left hemidiaphragm. Dense left basilar opacification may reflect atelectasis, consolidation, and/or effusion. Electronically Signed   By: Davina Poke D.O.   On: 10/28/2021 16:42   CT HEAD WO CONTRAST  Result Date: 10/28/2021 CLINICAL DATA:  61 year old female with headache following injury. Initial encounter. EXAM: CT HEAD WITHOUT CONTRAST TECHNIQUE: Contiguous axial images were obtained from the base of the skull through the vertex without intravenous contrast. RADIATION DOSE REDUCTION: This exam was performed according to the departmental dose-optimization program which includes automated exposure control, adjustment of the mA and/or kV according to patient size and/or use of iterative reconstruction technique. COMPARISON:  None Available. FINDINGS: Brain: No evidence of acute infarction, hemorrhage, hydrocephalus, extra-axial collection or mass lesion/mass effect. The cerebellar tonsils appear low lying. Vascular: Carotid and vertebral atherosclerotic calcifications are noted. Skull: Normal. Negative for fracture or focal  lesion. Sinuses/Orbits: No acute finding. Other: None. IMPRESSION: 1. No evidence of acute intracranial abnormality. 2. 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 Consider MRI for further evaluation as indicated. Electronically Signed   By: Margarette Canada M.D.   On: 10/28/2021 16:03    Assessment and plan-  #Chemotherapy-induced neutropenia, Candidemia,  Follow-up urine culture.  Images were independent reviewed by me. Patient has been started on G-CSF support-Granix 480 mcg daily. ANC has improved this morning.  Continue antibiotics.  ID recommendation reviewed. I agree with ID that her Mediport need to be removed.  TEE to rule out endocarditis.  #Anemia and thrombocytopenia secondary to recent chemotherapy. Stable.  Continue monitor.  transfusion if hemoglobin drops below 7 or platelet count drops below 10,000 or active bleeding.  #Metastatic squamous cell carcinoma of cervix Her current oncology care was at [facility].  Last chemotherapy 10/17/2021, treatment was complicated with nausea vomiting/UTI/cytopenia. Recommend palliative care service for further discussion of goals of care.  No recent CT images. Last CT was done at [facility]. Extensive lung mets, she also  has cervical lymphoadenopathy, infiltrative mass invading both thyroid lobes. Recommend repeat CT chest and neck  I discussed plan with both patient, husband and I called her daughter [name] and not able to reach her. Online Copy used.   Thank you for allowing me to participate in the care of this patient.    Earlie Server, MD, PhD  10/30/2021

## 2021-10-30 NOTE — Consult Note (Addendum)
Symphanie, Cederberg [id number] [birth year] [name], MD  Reason for Consult: hoarseness Requesting Physician: Jennye Boroughs, MD Consulting Physician: [name]  HPI: This 61 y.o. year old female was admitted on 10/28/2021 for Chemotherapy-induced neutropenia ([place]) [D70.1, T45.1X5A] Acute cystitis with hematuria [N30.01] Neutropenic fever ([place]) [D70.9, R50.81]. 61 y.o. female with PMH listed at below who presents to ER for evaluation of generalized weakness, nausea vomiting after her last chemotherapy.  Husband at bedside who provides the history.  Patient has had a chronic cough for couple of months.  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 Had recent multiple hospitalization at [facility] due to intractable nausea vomiting as well as E. coli UTI.   She has had intractable nausea vomiting despite utilizing her home antiemetics including Zofran, dexamethasone, olanzapine, compazine, Phenergan suppository ,  scopolamine patch.  Patient has also had cough which triggers for vomiting. + Headache no reported fever, chills   10/28/2021, chest x-ray showed low lung volumes with chronic elevation of left hemidiaphragm.  Dense left basilar opacification may reflect atelectasis, consolidation and/or effusion.  CT head without contrast showed no evidence of acute intracranial abnormality. 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  MRI brain without contrast showed no acute intra cranial abnormality.  I was consulted for evaluation of hoarseness which has been going on for few months along with the associated cough and vomiting.  She had a CT angiogram back in April which did not show any mediastinal adenopathy.  Allergies: No Known Allergies  Medications:  Medications Prior to Admission  Medication Sig Dispense Refill   dexamethasone (DECADRON) 4 MG tablet Take 4 mg by mouth as directed. Take 1 tablet ('4mg'$ ) 6/17-6/18 After chemo,  take 2 tablets ('8mg'$ ) on days 2-4 then 1 tablet (4 mg) on days 5-7     dronabinol (MARINOL) 2.5 MG capsule Take 2.5 mg by mouth daily.     LORazepam (ATIVAN) 0.5 MG tablet Take 0.5 mg by mouth every 8 (eight) hours as needed.     Multiple Vitamins-Minerals (THERA-M) TABS Take 1 tablet by mouth daily.     OLANZapine (ZYPREXA) 5 MG tablet Take 5 mg by mouth at bedtime.     ondansetron (ZOFRAN) 8 MG tablet Take 8 mg by mouth 3 (three) times daily.     scopolamine (TRANSDERM-SCOP) 1 MG/3DAYS 1 patch every 3 (three) days.     apixaban (ELIQUIS) 5 MG TABS tablet Take by mouth. (Patient not taking: Reported on 10/28/2021)     nitrofurantoin, macrocrystal-monohydrate, (MACROBID) 100 MG capsule Take 1 capsule (100 mg total) by mouth 2 (two) times daily. (Patient not taking: Reported on 10/28/2021) 10 capsule 0   prochlorperazine (COMPAZINE) 10 MG tablet Take 1 tablet (10 mg total) by mouth every 6 (six) hours as needed (Nausea or vomiting). 90 tablet 1   promethazine (PHENERGAN) 25 MG suppository Place 1 suppository (25 mg total) rectally every 8 (eight) hours as needed for nausea, vomiting or refractory nausea / vomiting. 90 each 1   traZODone (DESYREL) 50 MG tablet Take 1 tablet (50 mg total) by mouth at bedtime as needed for sleep. 30 tablet 1  .  Current Facility-Administered Medications  Medication Dose Route Frequency Provider Last Rate Last Admin   acetaminophen (TYLENOL) tablet 650 mg  650 mg Oral Q6H PRN Athena Masse, MD   650 mg at 10/29/21 1123   Or   acetaminophen (TYLENOL) suppository 650 mg  650 mg Rectal Q6H PRN Damita Dunnings,  Waldemar Dickens, MD       Chlorhexidine Gluconate Cloth 2 % PADS 6 each  6 each Topical Q0600 Foust, Katy L, NP   6 each at 10/30/21 0513   feeding supplement (BOOST / RESOURCE BREEZE) liquid 1 Container  1 Container Oral TID BM Elodia Florence., MD   1 Container at 10/30/21 0831   fluconazole (DIFLUCAN) IVPB 400 mg  400 mg Intravenous Q24H Elodia Florence., MD        guaiFENesin-dextromethorphan [facility] DM) 100-10 MG/5ML syrup 5 mL  5 mL Oral Q4H PRN Elodia Florence., MD   5 mL at 10/29/21 2155   lactated ringers infusion   Intravenous Continuous Athena Masse, MD 125 mL/hr at 10/29/21 1956 New Bag at 10/29/21 1956   morphine (PF) 2 MG/ML injection 2 mg  2 mg Intravenous Q2H PRN Athena Masse, MD   2 mg at 10/30/21 9242   multivitamin with minerals tablet 1 tablet  1 tablet Oral Daily Elodia Florence., MD   1 tablet at 10/30/21 0830   ondansetron (ZOFRAN) tablet 4 mg  4 mg Oral Q6H PRN Athena Masse, MD       Or   ondansetron [facility]) injection 4 mg  4 mg Intravenous Q6H PRN Athena Masse, MD   4 mg at 10/29/21 2140   Oral care mouth rinse  15 mL Mouth Rinse 4 times per day Foust, Valetta Fuller L, NP   15 mL at 10/30/21 0846   Oral care mouth rinse  15 mL Mouth Rinse PRN Foust, Katy L, NP       potassium chloride SA (KLOR-CON M) CR tablet 40 mEq  40 mEq Oral Q4H Jennye Boroughs, MD   40 mEq at 10/30/21 0830   promethazine (PHENERGAN) 12.5 mg in sodium chloride 0.9 % 50 mL IVPB  12.5 mg Intravenous Q6H PRN Judd Gaudier V, MD 200 mL/hr at 10/29/21 2305 12.5 mg at 10/29/21 2305   scopolamine (TRANSDERM-SCOP) 1 MG/3DAYS 1.5 mg  1 patch Transdermal Q72H Judd Gaudier V, MD   1.5 mg at 10/28/21 2337   Tbo-Filgrastim (GRANIX) injection 300 mcg  300 mcg Subcutaneous Daily Earlie Server, MD   300 mcg at 10/30/21 6834    PMH:  Past Medical History:  Diagnosis Date   Cancer ([place])    cervical CA, that spread to lymph nodes; last chemo was 12/08/2019; radiation 01/2020   COVID-19 10/2018   Nausea with vomiting 10/31/2019   Neoplasm related pain    No pertinent past medical history     Fam Hx:  Family History  Problem Relation Age of Onset   Blindness Mother    Glaucoma Mother    Diabetes Father    Diabetes Brother     Soc Hx:  Social History   Socioeconomic History   Marital status: Married    Spouse name: Rosezetta Schlatter    Number of children: 3    Years of education: Not on file   Highest education level: Not on file  Occupational History   Not on file  Tobacco Use   Smoking status: Never   Smokeless tobacco: Never  Vaping Use   Vaping Use: Never used  Substance and Sexual Activity   Alcohol use: Never   Drug use: Never   Sexual activity: Yes    Birth control/protection: None  Other Topics Concern   Not on file  Social History Narrative   Lives at home with spouse ;  daughter [name] comes & stays when needed   Social Determinants of Health   Financial Resource Strain: Not on file  Food Insecurity: Not on file  Transportation Needs: Not on file  Physical Activity: Not on file  Stress: Not on file  Social Connections: Not on file  Intimate Partner Violence: Not on file    PSH:  Past Surgical History:  Procedure Laterality Date   ESOPHAGOGASTRODUODENOSCOPY (EGD) WITH PROPOFOL N/A 03/28/2020   Procedure: ESOPHAGOGASTRODUODENOSCOPY (EGD) WITH PROPOFOL;  Surgeon: Milus Banister, MD;  Location: [facility];  Service: Endoscopy;  Laterality: N/A;   EUS N/A 03/28/2020   Procedure: UPPER ENDOSCOPIC ULTRASOUND (EUS) RADIAL;  Surgeon: Milus Banister, MD;  Location: [facility];  Service: Endoscopy;  Laterality: N/A;   no surgical history    . Procedures since admission: No admission procedures for hospital encounter.  ROS: Review of systems normal other than 12 systems except per HPI.  PHYSICAL EXAM Vitals:  Vitals:   10/30/21 0446 10/30/21 0735  BP: (!) 162/71 140/65  Pulse: 100 97  Resp: 18   Temp: 100 F (37.8 C) 99 F (37.2 C)  SpO2: 97% 97%  . General: Well-developed, Well-nourished in no acute distress Mood: Mood and affect well adjusted, pleasant and cooperative. Orientation: Grossly alert and oriented. Vocal Quality: Mild hoarseness head and Face: NCAT. No facial asymmetry. No visible skin lesions. No significant facial scars. No tenderness with sinus percussion. Facial strength normal and  symmetric. Ears: External ears with normal landmarks, no lesions. External auditory canals free of infection, cerumen impaction or lesions. Tympanic membranes intact with good landmarks. No middle ear effusion. Hearing: Speech reception grossly normal. Nose: External nose normal with midline dorsum and no lesions or deformity. Nasal Cavity reveals essentially midline septum with normal inferior turbinates. No significant mucosal congestion or erythema. Nasal secretions are minimal and clear. No polyps seen on anterior rhinoscopy. Oral Cavity/ Oropharynx: Lips are normal with no lesions. Teeth no frank dental caries. Gingiva healthy with no lesions or gingivitis. Oropharynx including tongue, buccal mucosa, floor of mouth, hard and soft palate, uvula and posterior pharynx free of exudates, erythema or lesions with normal symmetry and hydration.  Indirect Laryngoscopy/Nasopharyngoscopy: Visualization of the larynx, hypopharynx and nasopharynx is not possible in this setting with routine examination. Neck: Supple and symmetric with no palpable masses, tenderness or crepitance. The trachea is midline. Thyroid gland is soft, nontender and symmetric with no masses or enlargement. Parotid and submandibular glands are soft, nontender and symmetric, without masses. Lymphatic: Cervical lymph nodes are without palpable lymphadenopathy or tenderness. Respiratory: Normal respiratory effort without labored breathing. Cardiovascular: Carotid pulse shows regular rate and rhythm Neurologic: Cranial Nerves II through XII are grossly intact. Eyes: Gaze and Ocular Motility are grossly normal. PERRLA. No visible nystagmus.  MEDICAL DECISION MAKING: Data Review:  Results for orders placed or performed during the hospital encounter of 10/28/21 (from the past 48 hour(s))  Urine Culture     Status: Abnormal (Preliminary result)   Collection Time: 10/28/21  3:30 PM   Specimen: Urine, Clean Catch  Result Value Ref Range    Specimen Description      URINE, CLEAN CATCH Performed at [facility], [address], [place], [place] [address]    Special Requests      NONE Performed at [facility], [address], [place], [place] [address]    Culture (A)     >=100,000 COLONIES/mL GRAM NEGATIVE RODS SUSCEPTIBILITIES TO FOLLOW Performed at [facility], [address], [place], [place] [address]    Report Status PENDING   Basic metabolic panel     Status: Abnormal   Collection Time: 10/28/21  3:31 PM  Result Value Ref Range   Sodium 134 (L) 135 - 145 mmol/L   Potassium 3.6 3.5 - 5.1 mmol/L   Chloride 99 98 - 111 mmol/L   CO2 28 22 - 32 mmol/L   Glucose, Bld 107 (H) 70 - 99 mg/dL    Comment: Glucose reference range applies only to samples taken after fasting for at least 8 hours.   BUN 23 (H) 6 - 20 mg/dL   Creatinine, Ser 1.10 (H) 0.44 - 1.00 mg/dL   Calcium 8.7 (L) 8.9 - 10.3 mg/dL   GFR, Estimated 58 (L) >60 mL/min    Comment: (NOTE) Calculated using the CKD-EPI Creatinine Equation (2021)    Anion gap 7 5 - 15    Comment: Performed at [facility], [address] [place], [place] [address]  CBC     Status: Abnormal   Collection Time: 10/28/21  3:31 PM  Result Value Ref Range   WBC 1.0 (LL) 4.0 - 10.5 K/uL    Comment: REPEATED TO VERIFY WHITE COUNT CONFIRMED ON SMEAR THIS CRITICAL RESULT HAS VERIFIED AND BEEN CALLED TO ASHLEY MURRAY RN BY ALEXIS THOMAS ON 07 04 2023 AT 1632, AND HAS BEEN READ BACK.     RBC 2.86 (L) 3.87 - 5.11 MIL/uL   Hemoglobin 8.8 (L) 12.0 - 15.0 g/dL   HCT 26.4 (L) 36.0 - 46.0 %   MCV 92.3 80.0 - 100.0 fL   MCH 30.8 26.0 - 34.0 pg   MCHC 33.3 30.0 - 36.0 g/dL   RDW 14.8 11.5 - 15.5 %   Platelets 59 (L) 150 - 400 K/uL    Comment: Immature Platelet Fraction may be clinically indicated, consider ordering this additional test [id number] REPEATED TO VERIFY PLATELET COUNT CONFIRMED BY SMEAR    nRBC 0.0 0.0 - 0.2 %    Comment: Performed  at [facility], [address] [place], [place] [address]  Urinalysis, Routine w reflex microscopic Urine, Clean Catch     Status: Abnormal   Collection Time: 10/28/21  3:31 PM  Result Value Ref Range   Color, Urine AMBER (A) YELLOW    Comment: BIOCHEMICALS MAY BE AFFECTED BY COLOR   APPearance CLOUDY (A) CLEAR   Specific Gravity, Urine 1.014 1.005 - 1.030   pH 6.0 5.0 - 8.0   Glucose, UA NEGATIVE NEGATIVE mg/dL   Hgb urine dipstick NEGATIVE NEGATIVE   Bilirubin Urine NEGATIVE NEGATIVE   Ketones, ur NEGATIVE NEGATIVE mg/dL   Protein, ur 30 (A) NEGATIVE mg/dL   Nitrite NEGATIVE NEGATIVE   Leukocytes,Ua SMALL (A) NEGATIVE   RBC / HPF 0-5 0 - 5 RBC/hpf   WBC, UA 21-50 0 - 5 WBC/hpf   Bacteria, UA MANY (A) NONE SEEN   Squamous Epithelial / LPF 0-5 0 - 5   Mucus PRESENT     Comment: Performed at [facility], [address] [place], [place] [address]  Hepatic function panel     Status: Abnormal   Collection Time: 10/28/21  3:31 PM  Result Value Ref Range   Total Protein 6.4 (L) 6.5 - 8.1 g/dL   Albumin 3.3 (L) 3.5 - 5.0 g/dL   AST 30 15 - 41 U/L   ALT 42 0 - 44 U/L   Alkaline Phosphatase 68 38 - 126 U/L  Total Bilirubin 0.8 0.3 - 1.2 mg/dL   Bilirubin, Direct 0.2 0.0 - 0.2 mg/dL   Indirect Bilirubin 0.6 0.3 - 0.9 mg/dL    Comment: Performed at [facility], [address] [place], [place] [address]  Lipase, blood     Status: None   Collection Time: 10/28/21  3:31 PM  Result Value Ref Range   Lipase 23 11 - 51 U/L    Comment: Performed at [facility], [address] [place], [place] [address]  Procalcitonin - Baseline     Status: None   Collection Time: 10/28/21  3:31 PM  Result Value Ref Range   Procalcitonin <0.10 ng/mL    Comment:        Interpretation: PCT (Procalcitonin) <= 0.5 ng/mL: Systemic infection (sepsis) is not likely. Local bacterial infection is possible. (NOTE)       Sepsis PCT Algorithm           Lower  Respiratory Tract                                      Infection PCT Algorithm    ----------------------------     ----------------------------         PCT < 0.25 ng/mL                PCT < 0.10 ng/mL          Strongly encourage             Strongly discourage   discontinuation of antibiotics    initiation of antibiotics    ----------------------------     -----------------------------       PCT 0.25 - 0.50 ng/mL            PCT 0.10 - 0.25 ng/mL               OR       >80% decrease in PCT            Discourage initiation of                                            antibiotics      Encourage discontinuation           of antibiotics    ----------------------------     -----------------------------         PCT >= 0.50 ng/mL              PCT 0.26 - 0.50 ng/mL               AND        <80% decrease in PCT             Encourage initiation of                                             antibiotics       Encourage continuation           of antibiotics    ----------------------------     -----------------------------        PCT >= 0.50 ng/mL  PCT > 0.50 ng/mL               AND         increase in PCT                  Strongly encourage                                      initiation of antibiotics    Strongly encourage escalation           of antibiotics                                     -----------------------------                                           PCT <= 0.25 ng/mL                                                 OR                                        > 80% decrease in PCT                                      Discontinue / Do not initiate                                             antibiotics  Performed at [facility], [address] [place], [place] [address]   CBC with Differential/Platelet     Status: Abnormal   Collection Time: 10/28/21  3:31 PM  Result Value Ref Range   WBC 1.0 (LL) 4.0 - 10.5 K/uL    Comment: This critical result  has verified and been called to KATIE ALLRED by Michela Pitcher on 07 04 2023 at 2041, and has been read back.    RBC 2.80 (L) 3.87 - 5.11 MIL/uL   Hemoglobin 8.7 (L) 12.0 - 15.0 g/dL   HCT 26.1 (L) 36.0 - 46.0 %   MCV 93.2 80.0 - 100.0 fL   MCH 31.1 26.0 - 34.0 pg   MCHC 33.3 30.0 - 36.0 g/dL   RDW 14.8 11.5 - 15.5 %   Platelets 61 (L) 150 - 400 K/uL    Comment: Immature Platelet Fraction may be clinically indicated, consider ordering this additional test [id number] PLATELET COUNT CONFIRMED BY SMEAR    nRBC 0.0 0.0 - 0.2 %   Neutrophils Relative % 5 %   Neutro Abs 0.1 (LL) 1.7 - 7.7 K/uL    Comment: This critical result has verified and been called to KATIE ALLRED by Michela Pitcher on 07 04 2023 at 2042, and has been read back.    Lymphocytes Relative 70 %  Lymphs Abs 0.7 0.7 - 4.0 K/uL   Monocytes Relative 22 %   Monocytes Absolute 0.2 0.1 - 1.0 K/uL   Eosinophils Relative 1 %   Eosinophils Absolute 0.0 0.0 - 0.5 K/uL   Basophils Relative 0 %   Basophils Absolute 0.0 0.0 - 0.1 K/uL   WBC Morphology TOXIC GRANULATION    Smear Review Normal platelet morphology    Immature Granulocytes 2 %   Abs Immature Granulocytes 0.02 0.00 - 0.07 K/uL   Ovalocytes PRESENT     Comment: Performed at [facility], [address] [place], [place] [address]  Strep pneumoniae urinary antigen     Status: None   Collection Time: 10/28/21  3:31 PM  Result Value Ref Range   Strep Pneumo Urinary Antigen NEGATIVE NEGATIVE    Comment:        Infection due to S. pneumoniae cannot be absolutely ruled out since the antigen present may be below the detection limit of the test. Performed at [facility], [address] [place], [place] [address]   Lactic acid, plasma     Status: None   Collection Time: 10/28/21  6:51 PM  Result Value Ref Range   Lactic Acid, Venous 1.3 0.5 - 1.9 mmol/L    Comment: Performed at [facility], [address] [place], [place] [address]   SARS Coronavirus 2 by RT PCR (hospital order, performed in [facility] hospital lab) *cepheid single result test* Anterior Nasal Swab     Status: None   Collection Time: 10/28/21  6:51 PM   Specimen: Anterior Nasal Swab  Result Value Ref Range   SARS Coronavirus 2 by RT PCR NEGATIVE NEGATIVE    Comment: (NOTE) SARS-CoV-2 target nucleic acids are NOT DETECTED.  The SARS-CoV-2 RNA is generally detectable in upper and lower respiratory specimens during the acute phase of infection. The lowest concentration of SARS-CoV-2 viral copies this assay can detect is 250 copies / mL. A negative result does not preclude SARS-CoV-2 infection and should not be used as the sole basis for treatment or other patient management decisions.  A negative result may occur with improper specimen collection / handling, submission of specimen other than nasopharyngeal swab, presence of viral mutation(s) within the areas targeted by this assay, and inadequate number of viral copies (<250 copies / mL). A negative result must be combined with clinical observations, patient history, and epidemiological information.  Fact Sheet for Patients:   https://www.patel.info/  Fact Sheet for Healthcare Providers: https://hall.com/  This test is not yet approved or  cleared by the [place] FDA and has been authorized for detection and/or diagnosis of SARS-CoV-2 by FDA under an Emergency Use Authorization (EUA).  This EUA will remain in effect (meaning this test can be used) for the duration of the COVID-19 declaration under Section 564(b)(1) of the Act, 21 U.S.C. section 360bbb-3(b)(1), unless the authorization is terminated or revoked sooner.  Performed at [facility], [address] [place], [place] [address]   Lactic acid, plasma     Status: None   Collection Time: 10/28/21  8:25 PM  Result Value Ref Range   Lactic Acid, Venous 0.7 0.5 - 1.9 mmol/L     Comment: Performed at [facility], [address] [place], [place] [address]  Blood culture (routine x 2)     Status: None (Preliminary result)   Collection Time: 10/28/21  8:25 PM   Specimen: BLOOD  Result Value Ref Range   Specimen  Description BLOOD LEFT ASSIST CONTROL    Special Requests      BOTTLES DRAWN AEROBIC AND ANAEROBIC Blood Culture adequate volume   Culture  Setup Time      YEAST IN BOTH AEROBIC AND ANAEROBIC BOTTLES CRITICAL VALUE NOTED.  VALUE IS CONSISTENT WITH PREVIOUSLY REPORTED AND CALLED VALUE. Performed at [facility], [address] [place], [place] [address]    Culture YEAST    Report Status PENDING   Blood culture (routine x 2)     Status: Abnormal (Preliminary result)   Collection Time: 10/28/21  8:25 PM   Specimen: BLOOD  Result Value Ref Range   Specimen Description      BLOOD PORTA CATH Performed at [facility], [address], [place], [place] [address]    Special Requests      BOTTLES DRAWN AEROBIC AND ANAEROBIC Blood Culture adequate volume Performed at [facility], [address] [place], [place] [address]    Culture  Setup Time      Organism ID to follow IN BOTH AEROBIC AND ANAEROBIC BOTTLES YEAST CRITICAL RESULT CALLED TO, READ BACK BY AND VERIFIED WITH: Carey Bullocks 10/29/2021 at 1137 SRR Performed at [facility], [address] [place], [place] [address]    Culture (A)     CANDIDA ALBICANS CULTURE REINCUBATED FOR BETTER GROWTH Performed at [facility], [place] [address], [place], [place] [address]    Report Status PENDING   Blood Culture ID Panel (Reflexed)     Status: Abnormal   Collection Time: 10/28/21  8:25 PM  Result Value Ref Range   Enterococcus faecalis NOT DETECTED NOT DETECTED   Enterococcus Faecium NOT DETECTED NOT DETECTED   Listeria monocytogenes NOT DETECTED NOT DETECTED   Staphylococcus species NOT DETECTED NOT DETECTED   Staphylococcus aureus (BCID) NOT  DETECTED NOT DETECTED   Staphylococcus epidermidis NOT DETECTED NOT DETECTED   Staphylococcus lugdunensis NOT DETECTED NOT DETECTED   Streptococcus species NOT DETECTED NOT DETECTED   Streptococcus agalactiae NOT DETECTED NOT DETECTED   Streptococcus pneumoniae NOT DETECTED NOT DETECTED   Streptococcus pyogenes NOT DETECTED NOT DETECTED   A.calcoaceticus-baumannii NOT DETECTED NOT DETECTED   Bacteroides fragilis NOT DETECTED NOT DETECTED   Enterobacterales NOT DETECTED NOT DETECTED   Enterobacter cloacae complex NOT DETECTED NOT DETECTED   Escherichia coli NOT DETECTED NOT DETECTED   Klebsiella aerogenes NOT DETECTED NOT DETECTED   Klebsiella oxytoca NOT DETECTED NOT DETECTED   Klebsiella pneumoniae NOT DETECTED NOT DETECTED   Proteus species NOT DETECTED NOT DETECTED   Salmonella species NOT DETECTED NOT DETECTED   Serratia marcescens NOT DETECTED NOT DETECTED   Haemophilus influenzae NOT DETECTED NOT DETECTED   Neisseria meningitidis NOT DETECTED NOT DETECTED   Pseudomonas aeruginosa NOT DETECTED NOT DETECTED   Stenotrophomonas maltophilia NOT DETECTED NOT DETECTED   Candida albicans DETECTED (A) NOT DETECTED    Comment: CRITICAL RESULT CALLED TO, READ BACK BY AND VERIFIED WITH: Carey Bullocks 10/29/2021 at 1137 SRR    Candida auris NOT DETECTED NOT DETECTED   Candida glabrata NOT DETECTED NOT DETECTED   Candida krusei NOT DETECTED NOT DETECTED   Candida parapsilosis NOT DETECTED NOT DETECTED   Candida tropicalis NOT DETECTED NOT DETECTED   Cryptococcus neoformans/gattii NOT DETECTED NOT DETECTED    Comment: Performed at [facility], [address] [place], [place] [address]  Basic metabolic panel     Status: Abnormal   Collection Time: 10/29/21  5:54 AM  Result Value  Ref Range   Sodium 137 135 - 145 mmol/L   Potassium 3.4 (L) 3.5 - 5.1 mmol/L   Chloride 104 98 - 111 mmol/L   CO2 28 22 - 32 mmol/L   Glucose, Bld 90 70 - 99 mg/dL    Comment: Glucose reference range  applies only to samples taken after fasting for at least 8 hours.   BUN 18 6 - 20 mg/dL   Creatinine, Ser 0.85 0.44 - 1.00 mg/dL   Calcium 8.4 (L) 8.9 - 10.3 mg/dL   GFR, Estimated >60 >60 mL/min    Comment: (NOTE) Calculated using the CKD-EPI Creatinine Equation (2021)    Anion gap 5 5 - 15    Comment: Performed at [facility], [address] [place], [place] [address]  CBC     Status: Abnormal   Collection Time: 10/29/21  5:54 AM  Result Value Ref Range   WBC 1.0 (LL) 4.0 - 10.5 K/uL    Comment: CRITICAL VALUE NOTED.  VALUE IS CONSISTENT WITH PREVIOUSLY REPORTED AND CALLED VALUE.   RBC 2.53 (L) 3.87 - 5.11 MIL/uL   Hemoglobin 7.9 (L) 12.0 - 15.0 g/dL   HCT 23.6 (L) 36.0 - 46.0 %   MCV 93.3 80.0 - 100.0 fL   MCH 31.2 26.0 - 34.0 pg   MCHC 33.5 30.0 - 36.0 g/dL   RDW 14.6 11.5 - 15.5 %   Platelets 47 (L) 150 - 400 K/uL    Comment: Immature Platelet Fraction may be clinically indicated, consider ordering this additional test [id number] CONSISTENT WITH PREVIOUS RESULT    nRBC 0.0 0.0 - 0.2 %    Comment: Performed at [facility], [address] [place], [place] [address]  HIV Antibody (routine testing w rflx)     Status: None   Collection Time: 10/29/21  5:54 AM  Result Value Ref Range   HIV Screen 4th Generation wRfx Non Reactive Non Reactive    Comment: Performed at [facility], [place] [address], [place], [place] [address]  CBC with Differential/Platelet     Status: Abnormal   Collection Time: 10/29/21  5:54 AM  Result Value Ref Range   WBC 1.0 (LL) 4.0 - 10.5 K/uL    Comment: CRITICAL VALUE NOTED.  VALUE IS CONSISTENT WITH PREVIOUSLY REPORTED AND CALLED VALUE. THIS CRITICAL RESULT HAS VERIFIED AND BEEN CALLED TO CHRIS Grayer Sproles RN BY [facility] ON 07 05 2023 AT 0916, AND HAS BEEN READ BACK.     RBC 2.51 (L) 3.87 - 5.11 MIL/uL   Hemoglobin 7.8 (L) 12.0 - 15.0 g/dL   HCT 23.3 (L) 36.0 - 46.0 %   MCV 92.8 80.0 - 100.0 fL   MCH 31.1 26.0 - 34.0 pg    MCHC 33.5 30.0 - 36.0 g/dL   RDW 14.8 11.5 - 15.5 %   Platelets 50 (L) 150 - 400 K/uL    Comment: Immature Platelet Fraction may be clinically indicated, consider ordering this additional test [id number] CONSISTENT WITH PREVIOUS RESULT    nRBC 0.0 0.0 - 0.2 %   Neutrophils Relative % 20 %   Neutro Abs 0.2 (LL) 1.7 - 7.7 K/uL    Comment: REPEATED TO VERIFY THIS CRITICAL RESULT HAS VERIFIED AND BEEN CALLED TO CHRIS Shay Jhaveri RN BY [facility] ON 07 05 2023 AT [place], AND HAS BEEN READ BACK.     Lymphocytes Relative 53 %   Lymphs Abs 0.6 (L) 0.7 - 4.0 K/uL   Monocytes Relative 26 %  Monocytes Absolute 0.3 0.1 - 1.0 K/uL   Eosinophils Relative 1 %   Eosinophils Absolute 0.0 0.0 - 0.5 K/uL   Basophils Relative 0 %   Basophils Absolute 0.0 0.0 - 0.1 K/uL   WBC Morphology MORPHOLOGY UNREMARKABLE    RBC Morphology MORPHOLOGY UNREMARKABLE    Smear Review Normal platelet morphology    Immature Granulocytes 0 %   Abs Immature Granulocytes 0.00 0.00 - 0.07 K/uL    Comment: Performed at [facility], [address] [place], [place] [address]  MRSA Next Gen by PCR, Nasal     Status: None   Collection Time: 10/29/21 12:11 PM   Specimen: Nasal Mucosa; Nasal Swab  Result Value Ref Range   MRSA by PCR Next Gen NOT DETECTED NOT DETECTED    Comment: (NOTE) The GeneXpert MRSA Assay (FDA approved for NASAL specimens only), is one component of a comprehensive MRSA colonization surveillance program. It is not intended to diagnose MRSA infection nor to guide or monitor treatment for MRSA infections. Test performance is not FDA approved in patients less than 21 years old. Performed at [facility], [place], [place] [address]   Troponin I (High Sensitivity)     Status: Abnormal   Collection Time: 10/29/21 12:35 PM  Result Value Ref Range   Troponin I (High Sensitivity) 18 (H) <18 ng/L    Comment: (NOTE) Elevated high sensitivity troponin I (hsTnI) values and  significant  changes across serial measurements may suggest ACS but many other  chronic and acute conditions are known to elevate hsTnI results.  Refer to the "Links" section for chest pain algorithms and additional  guidance. Performed at [facility], [place], [place] [address]   Troponin I (High Sensitivity)     Status: Abnormal   Collection Time: 10/29/21  2:34 PM  Result Value Ref Range   Troponin I (High Sensitivity) 19 (H) <18 ng/L    Comment: (NOTE) Elevated high sensitivity troponin I (hsTnI) values and significant  changes across serial measurements may suggest ACS but many other  chronic and acute conditions are known to elevate hsTnI results.  Refer to the "Links" section for chest pain algorithms and additional  guidance. Performed at [facility] [address] [place], [place] [address]   Culture, blood (Routine X 2) w Reflex to ID Panel     Status: None (Preliminary result)   Collection Time: 10/29/21  2:41 PM   Specimen: BLOOD  Result Value Ref Range   Specimen Description BLOOD RIGHT ANTECUBITAL    Special Requests      BOTTLES DRAWN AEROBIC AND ANAEROBIC Blood Culture adequate volume   Culture      NO GROWTH < 12 HOURS Performed at [facility], [address] [place], [place] [address]    Report Status PENDING   Culture, blood (Routine X 2) w Reflex to ID Panel     Status: None (Preliminary result)   Collection Time: 10/29/21  3:00 PM   Specimen: BLOOD  Result Value Ref Range   Specimen Description BLOOD LEFT ANTECUBITAL    Special Requests      BOTTLES DRAWN AEROBIC AND ANAEROBIC Blood Culture adequate volume   Culture      NO GROWTH < 12 HOURS Performed at [facility] [address], [place], [place] [address]    Report Status PENDING   CBC with Differential/Platelet     Status: Abnormal   Collection Time: 10/30/21  5:14 AM  Result Value Ref Range   WBC 2.5 (L) 4.0 - 10.5 K/uL   RBC  2.62 (L) 3.87 - 5.11 MIL/uL   Hemoglobin 8.1 (L) 12.0 - 15.0 g/dL   HCT 24.0 (L) 36.0 - 46.0 %   MCV 91.6 80.0 - 100.0 fL   MCH 30.9 26.0 - 34.0 pg   MCHC 33.8 30.0 - 36.0 g/dL   RDW 14.5 11.5 - 15.5 %   Platelets 43 (L) 150 - 400 K/uL    Comment: Immature Platelet Fraction may be clinically indicated, consider ordering this additional test [id number]    nRBC 0.0 0.0 - 0.2 %   Neutrophils Relative % 71 %   Neutro Abs 1.8 1.7 - 7.7 K/uL   Lymphocytes Relative 19 %   Lymphs Abs 0.5 (L) 0.7 - 4.0 K/uL   Monocytes Relative 9 %   Monocytes Absolute 0.2 0.1 - 1.0 K/uL   Eosinophils Relative 0 %   Eosinophils Absolute 0.0 0.0 - 0.5 K/uL   Basophils Relative 1 %   Basophils Absolute 0.0 0.0 - 0.1 K/uL   RBC Morphology MORPHOLOGY UNREMARKABLE    Smear Review Normal platelet morphology    Immature Granulocytes 0 %   Abs Immature Granulocytes 0.01 0.00 - 0.07 K/uL    Comment: Performed at [facility], [address] [place], [place] [address]  Comprehensive metabolic panel     Status: Abnormal   Collection Time: 10/30/21  5:14 AM  Result Value Ref Range   Sodium 134 (L) 135 - 145 mmol/L   Potassium 3.3 (L) 3.5 - 5.1 mmol/L   Chloride 102 98 - 111 mmol/L   CO2 26 22 - 32 mmol/L   Glucose, Bld 90 70 - 99 mg/dL    Comment: Glucose reference range applies only to samples taken after fasting for at least 8 hours.   BUN 14 6 - 20 mg/dL   Creatinine, Ser 0.85 0.44 - 1.00 mg/dL   Calcium 8.4 (L) 8.9 - 10.3 mg/dL   Total Protein 6.1 (L) 6.5 - 8.1 g/dL   Albumin 2.9 (L) 3.5 - 5.0 g/dL   AST 24 15 - 41 U/L   ALT 30 0 - 44 U/L   Alkaline Phosphatase 64 38 - 126 U/L   Total Bilirubin 1.0 0.3 - 1.2 mg/dL   GFR, Estimated >60 >60 mL/min    Comment: (NOTE) Calculated using the CKD-EPI Creatinine Equation (2021)    Anion gap 6 5 - 15    Comment: Performed at [facility], [address] [place], [place] [address]  Magnesium     Status: Abnormal   Collection Time:  10/30/21  5:14 AM  Result Value Ref Range   Magnesium 1.4 (L) 1.7 - 2.4 mg/dL    Comment: Performed at [facility], [address], [place], [place] [address]  Phosphorus     Status: None   Collection Time: 10/30/21  5:14 AM  Result Value Ref Range   Phosphorus 2.6 2.5 - 4.6 mg/dL    Comment: Performed at [facility], [address] [place], [place] [address]  . MR BRAIN WO CONTRAST  Result Date: 10/29/2021 CLINICAL DATA:  Headache EXAM: MRI HEAD WITHOUT CONTRAST TECHNIQUE: Multiplanar, multiecho pulse sequences of the brain and surrounding structures were obtained without intravenous contrast. COMPARISON:  None Available. FINDINGS: Brain: No acute infarct, mass effect or extra-axial collection. No acute or chronic hemorrhage. There is multifocal hyperintense T2-weighted signal within the white matter. Parenchymal  volume and CSF spaces are normal. The midline structures are normal. Vascular: Major flow voids are preserved. Skull and upper cervical spine: Normal calvarium and skull base. Visualized upper cervical spine and soft tissues are normal. Sinuses/Orbits:No paranasal sinus fluid levels or advanced mucosal thickening. No mastoid or middle ear effusion. Normal orbits. IMPRESSION: 1. No acute intracranial abnormality. 2. Multifocal hyperintense T2-weighted signal within the white matter, most commonly seen in the setting of chronic small vessel ischemia. Electronically Signed   By: Ulyses Jarred M.D.   On: 10/29/2021 22:17   ECHOCARDIOGRAM COMPLETE  Result Date: 10/29/2021    ECHOCARDIOGRAM REPORT   Patient Name:   LAIYA WISBY Date of Exam: 10/29/2021 Medical Rec #:  [id number]           Height:       64.0 in Accession #:    [id number]          Weight:       160.9 lb Date of Birth:  Jun 19, 1960           BSA:          1.784 m Patient Age:    62 years            BP:           151/76 mmHg Patient Gender: F                   HR:           90 bpm. Exam Location:  [facility]  Procedure: 2D Echo, Color Doppler and Cardiac Doppler Indications:     R00.8 Other abnormalities of the heart  History:         Patient has no prior history of Echocardiogram examinations.                  Signs/Symptoms:Candidemia.  Sonographer:     Charmayne Sheer Referring Phys:  [id number] CALDWELL POWELL JR Diagnosing Phys: Kate Sable MD  Sonographer Comments: Suboptimal parasternal window and suboptimal subcostal window. IMPRESSIONS  1. Left ventricular ejection fraction, by estimation, is 50 to 55%. The left ventricle has low normal function. The left ventricle has no regional wall motion abnormalities. There is mild left ventricular hypertrophy. Left ventricular diastolic parameters are consistent with Grade I diastolic dysfunction (impaired relaxation).  2. Right ventricular systolic function is normal. The right ventricular size is normal.  3. The mitral valve is normal in structure. No evidence of mitral valve regurgitation.  4. The aortic valve was not well visualized. Aortic valve regurgitation is not visualized.  5. The inferior vena cava is normal in size with greater than 50% respiratory variability, suggesting right atrial pressure of 3 mmHg. FINDINGS  Left Ventricle: Left ventricular ejection fraction, by estimation, is 50 to 55%. The left ventricle has low normal function. The left ventricle has no regional wall motion abnormalities. The left ventricular internal cavity size was normal in size. There is mild left ventricular hypertrophy. Left ventricular diastolic parameters are consistent with Grade I diastolic dysfunction (impaired relaxation). Right Ventricle: The right ventricular size is normal. No increase in right ventricular wall thickness. Right ventricular systolic function is normal. Left Atrium: Left atrial size was normal in size. Right Atrium: Right atrial size was normal in size. Pericardium: There is no evidence of pericardial effusion. Mitral Valve: The mitral valve is normal in  structure. No evidence of mitral valve regurgitation. Tricuspid Valve: The tricuspid valve is not well visualized. Tricuspid valve regurgitation is  not demonstrated. Aortic Valve: The aortic valve was not well visualized. Aortic valve regurgitation is not visualized. Aortic valve mean gradient measures 3.0 mmHg. Aortic valve peak gradient measures 6.6 mmHg. Aortic valve area, by VTI measures 2.27 cm. Pulmonic Valve: The pulmonic valve was not well visualized. Pulmonic valve regurgitation is not visualized. Aorta: The aortic root and ascending aorta are structurally normal, with no evidence of dilitation. Venous: The inferior vena cava is normal in size with greater than 50% respiratory variability, suggesting right atrial pressure of 3 mmHg. IAS/Shunts: No atrial level shunt detected by color flow Doppler.  LEFT VENTRICLE PLAX 2D LVIDd:         3.00 cm   Diastology LVIDs:         2.51 cm   LV e' medial:    4.68 cm/s LV PW:         1.12 cm   LV E/e' medial:  15.0 LV IVS:        0.73 cm   LV e' lateral:   5.22 cm/s LVOT diam:     1.90 cm   LV E/e' lateral: 13.4 LV SV:         48 LV SV Index:   27 LVOT Area:     2.84 cm  RIGHT VENTRICLE RV Basal diam:  2.46 cm LEFT ATRIUM             Index        RIGHT ATRIUM          Index LA diam:        3.20 cm 1.79 cm/m   RA Area:     8.72 cm LA Vol (A2C):   24.0 ml 13.45 ml/m  RA Volume:   16.10 ml 9.03 ml/m LA Vol (A4C):   59.9 ml 33.58 ml/m LA Biplane Vol: 41.9 ml 23.49 ml/m  AORTIC VALVE                    PULMONIC VALVE AV Area (Vmax):    2.26 cm     PV Vmax:       0.99 m/s AV Area (Vmean):   2.33 cm     PV Peak grad:  3.9 mmHg AV Area (VTI):     2.27 cm AV Vmax:           128.00 cm/s AV Vmean:          83.700 cm/s AV VTI:            0.212 m AV Peak Grad:      6.6 mmHg AV Mean Grad:      3.0 mmHg LVOT Vmax:         102.00 cm/s LVOT Vmean:        68.700 cm/s LVOT VTI:          0.170 m LVOT/AV VTI ratio: 0.80  AORTA Ao Root diam: 3.40 cm MITRAL VALVE MV Area (PHT):  10.18 cm    SHUNTS MV Decel Time: 75 msec      Systemic VTI:  0.17 m MV E velocity: 70.05 cm/s   Systemic Diam: 1.90 cm MV A velocity: 117.50 cm/s MV E/A ratio:  0.60 Kate Sable MD Electronically signed by Kate Sable MD Signature Date/Time: 10/29/2021/3:32:34 PM    Final    DG Chest Portable 1 View  Result Date: 10/28/2021 CLINICAL DATA:  Cough EXAM: PORTABLE CHEST 1 VIEW COMPARISON:  08/10/2021 FINDINGS: Interval placement of a right-sided chest port with distal tip  terminating near the level of the superior cavoatrial junction. Low lung volumes with chronic elevation of the left hemidiaphragm. Heart size and mediastinal contours are largely obscured. Dense left basilar opacification may reflect atelectasis, consolidation, and or effusion. No pneumothorax. IMPRESSION: Low lung volumes with chronic elevation of the left hemidiaphragm. Dense left basilar opacification may reflect atelectasis, consolidation, and/or effusion. Electronically Signed   By: Davina Poke D.O.   On: 10/28/2021 16:42   CT HEAD WO CONTRAST  Result Date: 10/28/2021 CLINICAL DATA:  61 year old female with headache following injury. Initial encounter. EXAM: CT HEAD WITHOUT CONTRAST TECHNIQUE: Contiguous axial images were obtained from the base of the skull through the vertex without intravenous contrast. RADIATION DOSE REDUCTION: This exam was performed according to the departmental dose-optimization program which includes automated exposure control, adjustment of the mA and/or kV according to patient size and/or use of iterative reconstruction technique. COMPARISON:  None Available. FINDINGS: Brain: No evidence of acute infarction, hemorrhage, hydrocephalus, extra-axial collection or mass lesion/mass effect. The cerebellar tonsils appear low lying. Vascular: Carotid and vertebral atherosclerotic calcifications are noted. Skull: Normal. Negative for fracture or focal lesion. Sinuses/Orbits: No acute finding. Other: None.  IMPRESSION: 1. No evidence of acute intracranial abnormality. 2. 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 Consider MRI for further evaluation as indicated. Electronically Signed   By: Margarette Canada M.D.   On: 10/28/2021 16:03  .   PROCEDURE: Procedure: Diagnostic Fiberoptic Nasolaryngoscopy Preprocedure diagnosis: Hoarseness Postprocedure diagnosis: Hoarseness with left vocal cord paralysis  Indications: Persistent hoarseness Findings: The nasal cavity and nasopharynx are clear and free of lesions.  Hypopharynx larynx and tongue base are free of lesions.  Vocal cord on the right is mobile but the left vocal cord shows no mobility.  There are no associated vocal cord lesion such as nodules or polyps.  There is some edema in the lower hypopharynx likely due to recurrent vomiting/reflux but no lesions seen. Description of Procedure: After discussing procedure and risks  (primarily nose bleed) with the patient, the nose was anesthetized with topical Lidocaine 4% and decongested with phenylephrine. A flexible fiberoptic scope was passed through the nasal cavity. The nasal cavity was inspected and the scope passed through the Nasopharynx to the region of the hypopharynx and larynx. The patient was instructed to phonate to assess vocal cord mobility. The tongue was extended to evaluate the tongue base completely. Valsalva was performed to insufflate the hypopharynx for improved examination. Findings are as noted above. The scope was withdrawn. The patient tolerated the procedure well.  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  The pulmonary vessels looked a little prominent to  me on the prior CT angio, so in the absence of adenopathy, pulmonary hypertension can sometimes cause pulmonary vessel dilation and compressed the nerve.  Would recommend a CT scan of the neck and chest to evaluate for possible causes of left vocal cord paralysis.  She also needs a speech and swallowing evaluation with a modified barium swallow to assess aspiration risk.  Obviously control of her vomiting has been challenging, but that is certainly aggravating some of the hoarseness and throat discomfort she experiences.  Empiric addition of a PPI to reduce acid secretions may be helpful in reducing the irritation  associated with the vomiting. 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    [name], MD 10/30/2021 12:00 PM

## 2021-10-30 NOTE — Consult Note (Signed)
Chief Complaint: Patient was seen in consultation today for Candidemia at the request of infectious disease  Referring Physician(s): Dr. Tsosie Billing  Supervising Physician: Juliet Rude  Patient Status: Jill Rodriguez - In-pt  History of Present Illness: Jill Rodriguez is a 61 y.o. 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  Has had multiple hospitalizations secondary to N/V and complicated UTI.  Intractable vomiting associated with weakness brought her into the hospital 3 days ago.  Patient was admitted for neutropenic fever, UTI.  Possible HCAP.  Cultures have shown she has candidemia. Infectious disease has asked IR to remove her Port at this time  She continues with dry heaves and lethargy in the hospital. Discussion had with help of translator Mickel Baas, [phone].  Patient and her spouse are in agreement to proceed.   Past Medical History:  Diagnosis Date   Cancer ([place])    cervical CA, that spread to lymph nodes; last chemo was 12/08/2019; radiation 01/2020   COVID-19 10/2018   Nausea with vomiting 10/31/2019   Neoplasm related pain    No pertinent past medical history     Past Surgical History:  Procedure Laterality Date   ESOPHAGOGASTRODUODENOSCOPY (EGD) WITH PROPOFOL N/A 03/28/2020   Procedure: ESOPHAGOGASTRODUODENOSCOPY (EGD) WITH PROPOFOL;  Surgeon: Milus Banister, MD;  Location: [facility];  Service: Endoscopy;  Laterality: N/A;   EUS N/A 03/28/2020   Procedure: UPPER ENDOSCOPIC ULTRASOUND (EUS) RADIAL;  Surgeon: Milus Banister, MD;  Location: [facility];  Service: Endoscopy;  Laterality: N/A;   no surgical history      Allergies: Patient has no known allergies.  Medications: Prior to Admission medications   Medication Sig Start Date End Date Taking? Authorizing Provider  dexamethasone (DECADRON) 4 MG tablet Take 4 mg by mouth as directed. Take 1 tablet ('4mg'$ )  6/17-6/18 After chemo, take 2 tablets ('8mg'$ ) on days 2-4 then 1 tablet (4 mg) on days 5-7 10/25/21  Yes [provider]  dronabinol (MARINOL) 2.5 MG capsule Take 2.5 mg by mouth daily. 09/30/21  Yes [provider]  LORazepam (ATIVAN) 0.5 MG tablet Take 0.5 mg by mouth every 8 (eight) hours as needed. 10/27/21  Yes [provider]  Multiple Vitamins-Minerals (THERA-M) TABS Take 1 tablet by mouth daily. 10/10/21  Yes [provider]  OLANZapine (ZYPREXA) 5 MG tablet Take 5 mg by mouth at bedtime. 10/25/21  Yes [provider]  ondansetron (ZOFRAN) 8 MG tablet Take 8 mg by mouth 3 (three) times daily. 10/17/21  Yes [provider]  scopolamine (TRANSDERM-SCOP) 1 MG/3DAYS 1 patch every 3 (three) days. 10/10/21  Yes [provider]  apixaban (ELIQUIS) 5 MG TABS tablet Take by mouth. Patient not taking: Reported on 10/28/2021 08/12/21   [provider]  nitrofurantoin, macrocrystal-monohydrate, (MACROBID) 100 MG capsule Take 1 capsule (100 mg total) by mouth 2 (two) times daily. Patient not taking: Reported on 10/28/2021 07/23/21   Borders, Kirt Boys, NP  prochlorperazine (COMPAZINE) 10 MG tablet Take 1 tablet (10 mg total) by mouth every 6 (six) hours as needed (Nausea or vomiting). 07/25/21   Earlie Server, MD  promethazine (PHENERGAN) 25 MG suppository Place 1 suppository (25 mg total) rectally every 8 (eight) hours as needed for nausea, vomiting or refractory nausea / vomiting. 07/25/21   Earlie Server, MD  traZODone (DESYREL) 50 MG tablet Take 1 tablet (50 mg total) by mouth at bedtime as needed for sleep. 07/25/21  Earlie Server, MD     Family History  Problem Relation Age of Onset   Blindness Mother    Glaucoma Mother    Diabetes Father    Diabetes Brother     Social History   Socioeconomic History   Marital status: Married    Spouse name: Rosezetta Schlatter    Number of children: 3   Years of education: Not on file   Highest education level: Not on file   Occupational History   Not on file  Tobacco Use   Smoking status: Never   Smokeless tobacco: Never  Vaping Use   Vaping Use: Never used  Substance and Sexual Activity   Alcohol use: Never   Drug use: Never   Sexual activity: Yes    Birth control/protection: None  Other Topics Concern   Not on file  Social History Narrative   Lives at home with spouse ; daughter [name] comes & stays when needed   Social Determinants of Health   Financial Resource Strain: Not on file  Food Insecurity: Not on file  Transportation Needs: Not on file  Physical Activity: Not on file  Stress: Not on file  Social Connections: Not on file    Review of Systems: A 12 point ROS discussed and pertinent positives are indicated in the HPI above.   Vital Signs: BP 140/65 (BP Location: Right Arm)   Pulse 97   Temp 99 F (37.2 C) (Oral)   Resp 18   Ht '5\' 4"'$  (1.626 m)   Wt 160 lb 15 oz (73 kg)   SpO2 97%   BMI 27.62 kg/m     Physical Exam Constitutional:      General: She is in acute distress.     Appearance: She is ill-appearing.     Comments: Repetitive dry heaves during exam and conversation  HENT:     Mouth/Throat:     Mouth: Mucous membranes are dry.     Pharynx: Oropharynx is clear.  Cardiovascular:     Rate and Rhythm: Normal rate.     Pulses: Normal pulses.  Pulmonary:     Effort: Pulmonary effort is normal.  Neurological:     General: No focal deficit present.     Imaging: MR BRAIN WO CONTRAST  Result Date: 10/29/2021 CLINICAL DATA:  Headache EXAM: MRI HEAD WITHOUT CONTRAST TECHNIQUE: Multiplanar, multiecho pulse sequences of the brain and surrounding structures were obtained without intravenous contrast. COMPARISON:  None Available. FINDINGS: Brain: No acute infarct, mass effect or extra-axial collection. No acute or chronic hemorrhage. There is multifocal hyperintense T2-weighted signal within the white matter. Parenchymal volume and CSF spaces are normal. The midline  structures are normal. Vascular: Major flow voids are preserved. Skull and upper cervical spine: Normal calvarium and skull base. Visualized upper cervical spine and soft tissues are normal. Sinuses/Orbits:No paranasal sinus fluid levels or advanced mucosal thickening. No mastoid or middle ear effusion. Normal orbits. IMPRESSION: 1. No acute intracranial abnormality. 2. Multifocal hyperintense T2-weighted signal within the white matter, most commonly seen in the setting of chronic small vessel ischemia. Electronically Signed   By: Ulyses Jarred M.D.   On: 10/29/2021 22:17   ECHOCARDIOGRAM COMPLETE  Result Date: 10/29/2021    ECHOCARDIOGRAM REPORT   Patient Name:   Jill Rodriguez Date of Exam: 10/29/2021 Medical Rec #:  [id number]           Height:       64.0 in Accession #:    [id number]  Weight:       160.9 lb Date of Birth:  [birth year]           BSA:          1.784 m Patient Age:    60 years            BP:           151/76 mmHg Patient Gender: F                   HR:           90 bpm. Exam Location:  [facility] Procedure: 2D Echo, Color Doppler and Cardiac Doppler Indications:     R00.8 Other abnormalities of the heart  History:         Patient has no prior history of Echocardiogram examinations.                  Signs/Symptoms:Candidemia.  Sonographer:     Charmayne Sheer Referring Phys:  [id number] CALDWELL POWELL JR Diagnosing Phys: Kate Sable MD  Sonographer Comments: Suboptimal parasternal window and suboptimal subcostal window. IMPRESSIONS  1. Left ventricular ejection fraction, by estimation, is 50 to 55%. The left ventricle has low normal function. The left ventricle has no regional wall motion abnormalities. There is mild left ventricular hypertrophy. Left ventricular diastolic parameters are consistent with Grade I diastolic dysfunction (impaired relaxation).  2. Right ventricular systolic function is normal. The right ventricular size is normal.  3. The mitral valve is normal in structure. No  evidence of mitral valve regurgitation.  4. The aortic valve was not well visualized. Aortic valve regurgitation is not visualized.  5. The inferior vena cava is normal in size with greater than 50% respiratory variability, suggesting right atrial pressure of 3 mmHg. FINDINGS  Left Ventricle: Left ventricular ejection fraction, by estimation, is 50 to 55%. The left ventricle has low normal function. The left ventricle has no regional wall motion abnormalities. The left ventricular internal cavity size was normal in size. There is mild left ventricular hypertrophy. Left ventricular diastolic parameters are consistent with Grade I diastolic dysfunction (impaired relaxation). Right Ventricle: The right ventricular size is normal. No increase in right ventricular wall thickness. Right ventricular systolic function is normal. Left Atrium: Left atrial size was normal in size. Right Atrium: Right atrial size was normal in size. Pericardium: There is no evidence of pericardial effusion. Mitral Valve: The mitral valve is normal in structure. No evidence of mitral valve regurgitation. Tricuspid Valve: The tricuspid valve is not well visualized. Tricuspid valve regurgitation is not demonstrated. Aortic Valve: The aortic valve was not well visualized. Aortic valve regurgitation is not visualized. Aortic valve mean gradient measures 3.0 mmHg. Aortic valve peak gradient measures 6.6 mmHg. Aortic valve area, by VTI measures 2.27 cm. Pulmonic Valve: The pulmonic valve was not well visualized. Pulmonic valve regurgitation is not visualized. Aorta: The aortic root and ascending aorta are structurally normal, with no evidence of dilitation. Venous: The inferior vena cava is normal in size with greater than 50% respiratory variability, suggesting right atrial pressure of 3 mmHg. IAS/Shunts: No atrial level shunt detected by color flow Doppler.  LEFT VENTRICLE PLAX 2D LVIDd:         3.00 cm   Diastology LVIDs:         2.51 cm   LV e'  medial:    4.68 cm/s LV PW:         1.12 cm   LV E/e' medial:  15.0 LV IVS:        0.73 cm   LV e' lateral:   5.22 cm/s LVOT diam:     1.90 cm   LV E/e' lateral: 13.4 LV SV:         48 LV SV Index:   27 LVOT Area:     2.84 cm  RIGHT VENTRICLE RV Basal diam:  2.46 cm LEFT ATRIUM             Index        RIGHT ATRIUM          Index LA diam:        3.20 cm 1.79 cm/m   RA Area:     8.72 cm LA Vol (A2C):   24.0 ml 13.45 ml/m  RA Volume:   16.10 ml 9.03 ml/m LA Vol (A4C):   59.9 ml 33.58 ml/m LA Biplane Vol: 41.9 ml 23.49 ml/m  AORTIC VALVE                    PULMONIC VALVE AV Area (Vmax):    2.26 cm     PV Vmax:       0.99 m/s AV Area (Vmean):   2.33 cm     PV Peak grad:  3.9 mmHg AV Area (VTI):     2.27 cm AV Vmax:           128.00 cm/s AV Vmean:          83.700 cm/s AV VTI:            0.212 m AV Peak Grad:      6.6 mmHg AV Mean Grad:      3.0 mmHg LVOT Vmax:         102.00 cm/s LVOT Vmean:        68.700 cm/s LVOT VTI:          0.170 m LVOT/AV VTI ratio: 0.80  AORTA Ao Root diam: 3.40 cm MITRAL VALVE MV Area (PHT): 10.18 cm    SHUNTS MV Decel Time: 75 msec      Systemic VTI:  0.17 m MV E velocity: 70.05 cm/s   Systemic Diam: 1.90 cm MV A velocity: 117.50 cm/s MV E/A ratio:  0.60 Kate Sable MD Electronically signed by Kate Sable MD Signature Date/Time: 10/29/2021/3:32:34 PM    Final    DG Chest Portable 1 View  Result Date: 10/28/2021 CLINICAL DATA:  Cough EXAM: PORTABLE CHEST 1 VIEW COMPARISON:  08/10/2021 FINDINGS: Interval placement of a right-sided chest port with distal tip terminating near the level of the superior cavoatrial junction. Low lung volumes with chronic elevation of the left hemidiaphragm. Heart size and mediastinal contours are largely obscured. Dense left basilar opacification may reflect atelectasis, consolidation, and or effusion. No pneumothorax. IMPRESSION: Low lung volumes with chronic elevation of the left hemidiaphragm. Dense left basilar opacification may reflect  atelectasis, consolidation, and/or effusion. Electronically Signed   By: Davina Poke D.O.   On: 10/28/2021 16:42   CT HEAD WO CONTRAST  Result Date: 10/28/2021 CLINICAL DATA:  61 year old female with headache following injury. Initial encounter. EXAM: CT HEAD WITHOUT CONTRAST TECHNIQUE: Contiguous axial images were obtained from the base of the skull through the vertex without intravenous contrast. RADIATION DOSE REDUCTION: This exam was performed according to the departmental dose-optimization program which includes automated exposure control, adjustment of the mA and/or kV according to patient size and/or use of iterative reconstruction technique. COMPARISON:  None Available. FINDINGS: Brain:  No evidence of acute infarction, hemorrhage, hydrocephalus, extra-axial collection or mass lesion/mass effect. The cerebellar tonsils appear low lying. Vascular: Carotid and vertebral atherosclerotic calcifications are noted. Skull: Normal. Negative for fracture or focal lesion. Sinuses/Orbits: No acute finding. Other: None. IMPRESSION: 1. No evidence of acute intracranial abnormality. 2. 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 Consider MRI for further evaluation as indicated. Electronically Signed   By: Margarette Canada M.D.   On: 10/28/2021 16:03    Labs:  CBC: Recent Labs    08/10/21 0228 10/28/21 1531 10/29/21 0554 10/30/21 0514  WBC 5.3 1.0*  1.0* 1.0*  1.0* 2.5*  HGB 11.9* 8.7*  8.8* 7.8*  7.9* 8.1*  HCT 36.3 26.1*  26.4* 23.3*  23.6* 24.0*  PLT 236 61*  59* 50*  47* 43*   BMP: Recent Labs    08/10/21 0228 10/28/21 1531 10/29/21 0554 10/30/21 0514  NA 139 134* 137 134*  K 3.3* 3.6 3.4* 3.3*  CL 106 99 104 102  CO2 '26 28 28 26  '$ GLUCOSE 112* 107* 90 90  BUN 13 23* 18 14  CALCIUM 8.8* 8.7* 8.4* 8.4*  CREATININE 0.68 1.10* 0.85 0.85  GFRNONAA >60 58* >60 >60    LIVER FUNCTION TESTS: Recent Labs    07/23/21 1043 08/10/21 0228 10/28/21 1531 10/30/21 0514   BILITOT 0.4 0.9 0.8 1.0  AST '31 28 30 24  '$ ALT 31 25 42 30  ALKPHOS 87 81 68 64  PROT 7.5 7.5 6.4* 6.1*  ALBUMIN 4.0 3.7 3.3* 2.9*    Assessment and Plan:  Candidemia --in the setting of chemo induced N/V --Needs port removal at this time per ID --Patient and spouse in agreement  Risks and benefits of image guided port-a-catheter removal was discussed with the patient including, but not limited to bleeding, infection, pneumothorax, or fibrin sheath development and need for additional procedures.  All of the patient's questions were answered, patient is agreeable to proceed. Consent signed and in chart.   Thank you for this interesting consult.  I greatly enjoyed meeting Palmyra Rogacki and look forward to participating in their care.  A copy of this report was sent to the requesting provider on this date.  Electronically Signed: Pasty Spillers, PA 10/30/2021, 11:45 AM   I spent a total of 20 Minutes in face to face in clinical consultation, greater than 50% of which was counseling/coordinating care for candidemia.

## 2021-10-30 NOTE — Consult Note (Signed)
Consultation Note Date: 10/30/2021   Patient Name: Jill Rodriguez  DOB: 11-08-1960  MRN: [id number]  Age / Sex: 61 y.o., female  PCP: Pcp, No Referring Physician: Jennye Boroughs, MD  Reason for Consultation: Establishing goals of care and Psychosocial/spiritual support  HPI/Patient Profile: 61 y.o. 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  Restarted on chemo in May 2952 at [facility], complicated by chemotherapy-induced nausea vomiting and neutropenia, hospitalized at [facility] from 6/9 to 6/16 with chemotherapy induced nausea and vomiting as well as E. coli UTI sensitive only to ertapenem and nitrofurantoin, with last chemotherapy infusion on 6/23 who presents to the ED with intractable nausea and vomiting that started 2 days prior.    Admitted for treatment stabilization. Work-up significant for;  Urinalysis with small leukocyte esterase and many bacteria. CT head nonacute.  Chest x-ray shows dense left basilar opacification may reflect atelectasis, consolidation and/or effusion.  According to medical records it seems that patient had been having oncologic treatment here at [facility] previously but switched to [facility] oncology.  Patient and family face treatment option decisions, advanced directive decisions and anticipatory care needs.     Clinical Assessment and Goals of Care:  This NP Wadie Lessen reviewed medical records, received report from team, assessed the patient and then meet at the patient's bedside along with her husband and utilization of Spanish computer interpreter to discuss diagnosis, prognosis, GOC, EOL wishes disposition and options.   Concept of Palliative Care was introduced as specialized medical care for people and their families living with serious illness.  If focuses on providing relief from the  symptoms and stress of a serious illness.  The goal is to improve quality of life for both the patient and the family. Values and goals of care important to patient and family were attempted to be elicited.  Created space and opportunity for patient  and family to explore thoughts and feelings regarding current medical situation.  Patient was unable to participate in conversation today secondary to hoarseness, cough and sputum production.  Extensive education offered on current medical situation.  Informed husband that multiple specialties have been consulted to include infectious disease, oncology, radiology, and surgical ENT. Education offered that full work-up to include ENT evaluation of vocal cords, TEE to rule out endocarditis, removal of Port-A-Cath.     Education offered on the seriousness of metastatic disease and associated palliative treatments.  In spite of multiple attempts it was difficult to ascertain an actual history of the patient's disease trajectory and accurate account of treatment regiments.    Husband verbalized multiple times that patient was refused care here at [facility], they were referred to [facility] and again to [facility].  He made reference to she  "did not have the right insurance"  I made every effort to clarify any misunderstandings.  Education offered regarding best ways to advocate for self and to participate actively in treatment plan., and navigate healthcare  system   Questions and concerns addressed.  Family encouraged to call with questions or concerns.     PMT will continue to support holistically.    Education offered today regarding  the importance of continued conversation with family and the  medical providers regarding overall plan of care and treatment options,  ensuring decisions are within the context of the patients values and GOCs.  I stressed the importance of utilization of a medical interpreter for all encounters regarding her treatment, hoping  to eliminate any further confusion and misunderstandings.   This nurse practitioner informed  the patient/family and the attending that a PMT provider will follow-up on Monday, assessing for palliative medicine needs.     No documented H POA or advanced care documents noted  NEXT OF KIN    SUMMARY OF RECOMMENDATIONS    Code Status/Advance Care Planning: Full code   Symptom Management:  Nausea /anxiety: Ativan 0.5 mg p.o. 3 times daily  Palliative Prophylaxis:  Aspiration, Frequent Pain Assessment, and Oral Care  Additional Recommendations (Limitations, Scope, Preferences): Full Scope Treatment  Prognosis:  Unable to determine  Discharge Planning: To Be Determined      Primary Diagnoses: Present on Admission:  Neutropenic fever ([place])  Chemotherapy induced nausea and vomiting   I have reviewed the medical record, interviewed the patient and family, and examined the patient. The following aspects are pertinent.  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; last chemo was 12/08/2019; radiation 01/2020   COVID-19 10/2018   Nausea with vomiting 10/31/2019   Neoplasm related pain    No pertinent past medical history    Social History   Socioeconomic History   Marital status: Married    Spouse name: Rosezetta Schlatter    Number of children: 3   Years of education: Not on file   Highest education level: Not on file  Occupational History   Not on file  Tobacco Use   Smoking status: Never   Smokeless tobacco: Never  Vaping Use   Vaping Use: Never used  Substance and Sexual Activity   Alcohol use: Never   Drug use: Never   Sexual activity: Yes    Birth control/protection: None  Other Topics Concern   Not on file  Social History Narrative   Lives at home with spouse ; daughter [name] comes & stays when needed   Social Determinants of Health   Financial Resource Strain: Not on file  Food Insecurity: Not on file   Transportation Needs: Not on file  Physical Activity: Not on file  Stress: Not on file  Social Connections: Not on file   Family History  Problem Relation Age of Onset   Blindness Mother    Glaucoma Mother    Diabetes Father    Diabetes Brother    Scheduled Meds:  Chlorhexidine Gluconate Cloth  6 each Topical Q0600   feeding supplement  1 Container Oral TID BM   multivitamin with minerals  1 tablet Oral Daily   mouth rinse  15 mL Mouth Rinse 4 times per day   potassium chloride  40 mEq Oral Q4H   scopolamine  1 patch Transdermal Q72H   Tbo-Filgrastim  300 mcg Subcutaneous Daily   Continuous Infusions:  fluconazole (DIFLUCAN) IV     lactated ringers 125 mL/hr at 10/29/21 1956   promethazine (PHENERGAN) injection (IM or IVPB) 12.5 mg (10/29/21 2305)   PRN Meds:.acetaminophen **OR** acetaminophen, guaiFENesin-dextromethorphan, morphine  injection, ondansetron **OR** ondansetron (ZOFRAN) IV, mouth rinse, promethazine (PHENERGAN) injection (IM or IVPB) Medications Prior to Admission:  Prior to Admission medications   Medication Sig Start Date End Date Taking? Authorizing Provider  dexamethasone (DECADRON) 4 MG tablet Take 4 mg by mouth as directed. Take 1 tablet ('4mg'$ ) 6/17-6/18 After chemo, take 2 tablets ('8mg'$ ) on days 2-4 then 1 tablet (4 mg) on days 5-7 10/25/21  Yes [provider]  dronabinol (MARINOL) 2.5 MG capsule Take 2.5 mg by mouth daily. 09/30/21  Yes [provider]  LORazepam (ATIVAN) 0.5 MG tablet Take 0.5 mg by mouth every 8 (eight) hours as needed. 10/27/21  Yes [provider]  Multiple Vitamins-Minerals (THERA-M) TABS Take 1 tablet by mouth daily. 10/10/21  Yes [provider]  OLANZapine (ZYPREXA) 5 MG tablet Take 5 mg by mouth at bedtime. 10/25/21  Yes [provider]  ondansetron (ZOFRAN) 8 MG tablet Take 8 mg by mouth 3 (three) times daily. 10/17/21  Yes [provider]  scopolamine (TRANSDERM-SCOP) 1 MG/3DAYS 1 patch  every 3 (three) days. 10/10/21  Yes [provider]  apixaban (ELIQUIS) 5 MG TABS tablet Take by mouth. Patient not taking: Reported on 10/28/2021 08/12/21   [provider]  nitrofurantoin, macrocrystal-monohydrate, (MACROBID) 100 MG capsule Take 1 capsule (100 mg total) by mouth 2 (two) times daily. Patient not taking: Reported on 10/28/2021 07/23/21   Borders, Kirt Boys, NP  prochlorperazine (COMPAZINE) 10 MG tablet Take 1 tablet (10 mg total) by mouth every 6 (six) hours as needed (Nausea or vomiting). 07/25/21   Earlie Server, MD  promethazine (PHENERGAN) 25 MG suppository Place 1 suppository (25 mg total) rectally every 8 (eight) hours as needed for nausea, vomiting or refractory nausea / vomiting. 07/25/21   Earlie Server, MD  traZODone (DESYREL) 50 MG tablet Take 1 tablet (50 mg total) by mouth at bedtime as needed for sleep. 07/25/21   Earlie Server, MD   No Known Allergies Review of Systems  Constitutional:  Positive for fatigue.  Respiratory:  Positive for cough.   Gastrointestinal:  Positive for nausea.    Physical Exam Constitutional:      General: She is awake.     Appearance: She is normal weight. She is ill-appearing.  Cardiovascular:     Rate and Rhythm: Normal rate.  Skin:    General: Skin is warm and dry.     Vital Signs: BP 140/65 (BP Location: Right Arm)   Pulse 97   Temp 99 F (37.2 C) (Oral)   Resp 18   Ht '5\' 4"'$  (1.626 m)   Wt 73 kg   SpO2 97%   BMI 27.62 kg/m  Pain Scale: 0-10   Pain Score: Asleep   SpO2: SpO2: 97 % O2 Device:SpO2: 97 % O2 Flow Rate: .   IO: Intake/output summary:  Intake/Output Summary (Last 24 hours) at 10/30/2021 1156 Last data filed at 10/30/2021 0212 Gross per 24 hour  Intake 1520.81 ml  Output 600 ml  Net 920.81 ml    LBM: Last BM Date : 10/29/21 Baseline Weight: Weight: 77.1 kg Most recent weight: Weight: 73 kg     Palliative Assessment/Data:    Discussed with bedside RN and treatment team via secure chat   Signed  by: Wadie Lessen, NP   Please contact Palliative Medicine Team phone at [phone] for questions and concerns.  For individual provider: See Shea Evans

## 2021-10-30 NOTE — Progress Notes (Signed)
   [facility] has been requested to perform a transesophageal echocardiogram on Jill Rodriguez for bacteremia.  After careful review of history and examination, the risks and benefits of transesophageal echocardiogram have been explained to the patient and her husband via use of Spanish interpreter service, including risks of esophageal damage, perforation (1:10,000 risk), bleeding, pharyngeal hematoma as well as other potential complications associated with conscious sedation including aspiration, arrhythmia, respiratory failure and death. Alternatives to treatment were discussed, questions were answered. Patient is willing to proceed.   Murray Hodgkins, NP  10/30/2021 10:27 AM

## 2021-10-30 NOTE — Progress Notes (Addendum)
Progress Note    Jill Rodriguez  PPI:[id number] DOB: 05-Nov-1960  DOA: 10/28/2021 PCP: Pcp, No      Brief Narrative:    Medical records reviewed and are as summarized below:   Jill Rodriguez is a 61 y.o. 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 coli UTI sensitive only to ertapenem and nitrofurantoin, with last chemotherapy infusion on 6/23 who presents to the ED with intractable nausea and vomiting that started 2 days prior.   During her last chemotherapy session she developed acute nausea and vomiting that was treated with Compazine.  Her nausea and vomiting was managed for the first few days with dexamethasone, Marinol, Zofran, Phenergan suppository and scopolamine patch, however 2 days ago she developed intractable vomiting associated with weakness.  According to her husband at the bedside, patient has been having bouts of coughing for the past several weeks and now every time she has a bout of coughing she vomits.  He feels like this happens every 4-6 hours over the past two days.  She has not had shortness of breath, chest pain or fever.  She's been admitted with nausea/vomiting, neutropenia.  She's now been shown to have candidemia.  Currently on broad spectrum antiinfectives.  ID and oncology consulted.  She has markedly abnormal EKG, cardiology c/s pending.   See below for additional details      Assessment/Plan:   Principal Problem:   Chemotherapy induced nausea and vomiting Active Problems:   Neutropenic fever (HCC)   Candidemia (HCC)   Pyuria   HCAP (healthcare-associated pneumonia)   Pancytopenia due to antineoplastic chemotherapy (HCC)   Abnormal EKG   AKI (acute kidney injury)  (HCC)   Abnormal CT of the head   Chemotherapy-induced neutropenia (HCC)   Vocal cord paralysis   Nutrition Problem: Inadequate oral intake Etiology: poor appetite  Signs/Symptoms: per patient/family report   Body mass index is 27.62 kg/m.   Candidemia with Candida albicans, neutropenic fever: Blood culture showed Candida albicans.  Continue IV fluconazole.  Right upper chest Port-A-Cath was removed by IR on 10/30/2021.  Plan for TEE to rule out endocarditis.  Follow-up with ID.  Vocal cord paralysis: CT neck and CT chest have been ordered for further evaluation.  Appreciate input from Dr. Richardson Landry, otolaryngologist.  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  Chemotherapy-induced pancytopenia: Continue Tbo-filgrastim.  No indication for platelet or blood transfusion at this time.  Nausea and vomiting: Antiemetics as needed.  Hypokalemia and hypomagnesemia: Replete potassium and magnesium intravenously.  Monitor levels.    Pyuria, E. coli in urine, probable UTI: Defer to ID specialist to make decision on antibiotics.  Lung infiltrates may be due to pulmonary metastasis/atelectasis.  Abnormal head CT: No Chiari I malformation was identified on MRI brain.  Abnormal EKG (T wave inversions in leads I and aVL, prolonged QTc-460), elevated troponins: Repeat EKG when potassium level improves.  Mildly elevated troponins likely from demand ischemia.    AKI: Improved  Diet Order             Diet NPO time specified  Diet effective midnight           Diet NPO time  specified  Diet effective now                        Consultants: Cardiologist, otolaryngologist, interventional radiologist, oncologist, infectious disease  Procedures: Port-A-Cath removal on 10/30/2021    Medications:    Chlorhexidine Gluconate Cloth  6 each Topical Q0600   feeding supplement   1 Container Oral TID BM   fentaNYL       LORazepam  0.5 mg Oral TID   midazolam       multivitamin with minerals  1 tablet Oral Daily   mouth rinse  15 mL Mouth Rinse 4 times per day   scopolamine  1 patch Transdermal Q72H   Tbo-Filgrastim  300 mcg Subcutaneous Daily   Continuous Infusions:  fluconazole (DIFLUCAN) IV 400 mg (10/30/21 1650)   lactated ringers 1,000 mL with potassium chloride 30 mEq/L Pediatric IV infusion     promethazine (PHENERGAN) injection (IM or IVPB) 12.5 mg (10/30/21 1647)     Anti-infectives (From admission, onward)    Start     Dose/Rate Route Frequency Ordered Stop   10/30/21 1600  fluconazole (DIFLUCAN) IVPB 400 mg        400 mg 100 mL/hr over 120 Minutes Intravenous Every 24 hours 10/29/21 1250     10/29/21 2300  vancomycin (VANCOREADY) IVPB 750 mg/150 mL  Status:  Discontinued        750 mg 150 mL/hr over 60 Minutes Intravenous Every 24 hours 10/28/21 2234 10/29/21 1028   10/29/21 1630  fluconazole (DIFLUCAN) IVPB 400 mg       See Hyperspace for full Linked Orders Report.   400 mg 100 mL/hr over 120 Minutes Intravenous  Once 10/29/21 1250 10/29/21 1858   10/29/21 1430  fluconazole (DIFLUCAN) IVPB 400 mg       See Hyperspace for full Linked Orders Report.   400 mg 100 mL/hr over 120 Minutes Intravenous  Once 10/29/21 1250 10/29/21 1607   10/29/21 1315  micafungin (MYCAMINE) 100 mg in sodium chloride 0.9 % 100 mL IVPB  Status:  Discontinued        100 mg 105 mL/hr over 1 Hours Intravenous Every 24 hours 10/29/21 1220 10/29/21 1250   10/29/21 1200  vancomycin (VANCOREADY) IVPB 750 mg/150 mL  Status:  Discontinued        750 mg 150 mL/hr over 60 Minutes Intravenous Every 12 hours 10/29/21 1028 10/29/21 1428   10/28/21 2245  meropenem (MERREM) 1 g in sodium chloride 0.9 % 100 mL IVPB  Status:  Discontinued        1 g 200 mL/hr over 30 Minutes Intravenous Every 8 hours 10/28/21 2231 10/29/21 1953   10/28/21 2245  vancomycin (VANCOREADY) IVPB 1750  mg/350 mL        1,750 mg 175 mL/hr over 120 Minutes Intravenous  Once 10/28/21 2231 10/29/21 0119   10/28/21 2015  ceFEPIme (MAXIPIME) 2 g in sodium chloride 0.9 % 100 mL IVPB        2 g 200 mL/hr over 30 Minutes Intravenous  Once 10/28/21 2009 10/28/21 2151   10/28/21 1845  cefTRIAXone (ROCEPHIN) 1 g in sodium chloride 0.9 % 100 mL IVPB  Status:  Discontinued        1 g 200 mL/hr over 30 Minutes Intravenous  Once 10/28/21 1838 10/28/21 1839              Family Communication/Anticipated D/C date and plan/Code Status   DVT prophylaxis: SCDs Start: 10/28/21  2223     Code Status: Full Code  Family Communication: Plan discussed with her husband at the bedside Disposition Plan: To be determined.  She will probably need SNF at discharge.   Status is: Inpatient Remains inpatient appropriate because: Candidemia on IV fluconazole       Subjective:   Interval events noted.  She complains of cough and hoarse voice.  Her husband was at the bedside and he said patient had been vomiting all night although she said it is only a small amount of vomitus.  I did not see much in the emesis bag.  Objective:    Vitals:   10/30/21 1515 10/30/21 1534 10/30/21 1545 10/30/21 1619  BP: 136/75 (!) 144/81 138/84 (!) 159/83  Pulse: (!) 101 (!) 107 99 (!) 101  Resp: '12 19 20 18  '$ Temp:    99.9 F (37.7 C)  TempSrc:    Oral  SpO2: 92% 98% 98% 94%  Weight:      Height:       No data found.   Intake/Output Summary (Last 24 hours) at 10/30/2021 1655 Last data filed at 10/30/2021 0212 Gross per 24 hour  Intake 146.79 ml  Output 600 ml  Net -453.21 ml   Filed Weights   10/28/21 1529 10/29/21 0159  Weight: 77.1 kg 73 kg    Exam:  GEN: NAD SKIN: Warm and dry EYES: EOMI ENT: MMM CV: RRR PULM: CTA B ABD: soft, obese, NT, +BS CNS: AAO x 3, non focal EXT: No edema or tenderness        Data Reviewed:   I have personally reviewed following labs and imaging  studies:  Labs: Labs show the following:   Basic Metabolic Panel: Recent Labs  Lab 10/28/21 1531 10/29/21 0554 10/30/21 0514  NA 134* 137 134*  K 3.6 3.4* 3.3*  CL 99 104 102  CO2 '28 28 26  '$ GLUCOSE 107* 90 90  BUN 23* 18 14  CREATININE 1.10* 0.85 0.85  CALCIUM 8.7* 8.4* 8.4*  MG  --   --  1.4*  PHOS  --   --  2.6   GFR Estimated Creatinine Clearance: 68.9 mL/min (by C-G formula based on SCr of 0.85 mg/dL). Liver Function Tests: Recent Labs  Lab 10/28/21 1531 10/30/21 0514  AST 30 24  ALT 42 30  ALKPHOS 68 64  BILITOT 0.8 1.0  PROT 6.4* 6.1*  ALBUMIN 3.3* 2.9*   Recent Labs  Lab 10/28/21 1531  LIPASE 23   No results for input(s): "AMMONIA" in the last 168 hours. Coagulation profile No results for input(s): "INR", "PROTIME" in the last 168 hours.  CBC: Recent Labs  Lab 10/28/21 1531 10/29/21 0554 10/30/21 0514  WBC 1.0*  1.0* 1.0*  1.0* 2.5*  NEUTROABS 0.1* 0.2* 1.8  HGB 8.7*  8.8* 7.8*  7.9* 8.1*  HCT 26.1*  26.4* 23.3*  23.6* 24.0*  MCV 93.2  92.3 92.8  93.3 91.6  PLT 61*  59* 50*  47* 43*   Cardiac Enzymes: No results for input(s): "CKTOTAL", "CKMB", "CKMBINDEX", "TROPONINI" in the last 168 hours. BNP (last 3 results) No results for input(s): "PROBNP" in the last 8760 hours. CBG: No results for input(s): "GLUCAP" in the last 168 hours. D-Dimer: No results for input(s): "DDIMER" in the last 72 hours. Hgb A1c: No results for input(s): "HGBA1C" in the last 72 hours. Lipid Profile: No results for input(s): "CHOL", "HDL", "LDLCALC", "TRIG", "CHOLHDL", "LDLDIRECT" in the last 72 hours. Thyroid function studies: No  results for input(s): "TSH", "T4TOTAL", "T3FREE", "THYROIDAB" in the last 72 hours.  Invalid input(s): "FREET3" Anemia work up: No results for input(s): "VITAMINB12", "FOLATE", "FERRITIN", "TIBC", "IRON", "RETICCTPCT" in the last 72 hours. Sepsis Labs: Recent Labs  Lab 10/28/21 1531 10/28/21 1851 10/28/21 2025  10/29/21 0554 10/30/21 0514  PROCALCITON <0.10  --   --   --   --   WBC 1.0*  1.0*  --   --  1.0*  1.0* 2.5*  LATICACIDVEN  --  1.3 0.7  --   --     Microbiology Recent Results (from the past 240 hour(s))  Urine Culture     Status: Abnormal (Preliminary result)   Collection Time: 10/28/21  3:30 PM   Specimen: Urine, Clean Catch  Result Value Ref Range Status   Specimen Description   Final    URINE, CLEAN CATCH Performed at [facility], [address], [place], [place] [address]    Special Requests   Final    NONE Performed at [facility], [address] [place], [place] [address]    Culture (A)  Final    >=100,000 COLONIES/mL ESCHERICHIA COLI SUSCEPTIBILITIES TO FOLLOW Performed at [facility], [place] [address], [place], [place] [address]    Report Status PENDING  Incomplete  SARS Coronavirus 2 by RT PCR (hospital order, performed in [facility] hospital lab) *cepheid single result test* Anterior Nasal Swab     Status: None   Collection Time: 10/28/21  6:51 PM   Specimen: Anterior Nasal Swab  Result Value Ref Range Status   SARS Coronavirus 2 by RT PCR NEGATIVE NEGATIVE Final    Comment: (NOTE) SARS-CoV-2 target nucleic acids are NOT DETECTED.  The SARS-CoV-2 RNA is generally detectable in upper and lower respiratory specimens during the acute phase of infection. The lowest concentration of SARS-CoV-2 viral copies this assay can detect is 250 copies / mL. A negative result does not preclude SARS-CoV-2 infection and should not be used as the sole basis for treatment or other patient management decisions.  A negative result may occur with improper specimen collection / handling, submission of specimen other than nasopharyngeal swab, presence of viral mutation(s) within the areas targeted by this assay, and inadequate number of viral copies (<250 copies / mL). A negative result must be combined with clinical observations, patient history, and  epidemiological information.  Fact Sheet for Patients:   https://www.patel.info/  Fact Sheet for Healthcare Providers: https://hall.com/  This test is not yet approved or  cleared by the [place] FDA and has been authorized for detection and/or diagnosis of SARS-CoV-2 by FDA under an Emergency Use Authorization (EUA).  This EUA will remain in effect (meaning this test can be used) for the duration of the COVID-19 declaration under Section 564(b)(1) of the Act, 21 U.S.C. section 360bbb-3(b)(1), unless the authorization is terminated or revoked sooner.  Performed at [facility], [address] [place], [place] [address]   Blood culture (routine x 2)     Status: None (Preliminary result)   Collection Time: 10/28/21  8:25 PM   Specimen: BLOOD  Result Value Ref Range Status   Specimen Description BLOOD LEFT ASSIST CONTROL  Final   Special Requests   Final    BOTTLES DRAWN AEROBIC AND ANAEROBIC Blood Culture adequate volume   Culture  Setup Time   Final    YEAST IN BOTH AEROBIC AND ANAEROBIC BOTTLES CRITICAL VALUE NOTED.  VALUE IS CONSISTENT WITH PREVIOUSLY REPORTED AND CALLED  VALUE. Performed at [facility], [address] [place], [place] [address]    Culture YEAST  Final   Report Status PENDING  Incomplete  Blood culture (routine x 2)     Status: Abnormal (Preliminary result)   Collection Time: 10/28/21  8:25 PM   Specimen: BLOOD  Result Value Ref Range Status   Specimen Description   Final    BLOOD PORTA CATH Performed at [facility], [address] [place], [place] [address]    Special Requests   Final    BOTTLES DRAWN AEROBIC AND ANAEROBIC Blood Culture adequate volume Performed at [facility], [address] [place], [place] [address]    Culture  Setup Time   Final    Organism ID to follow IN BOTH AEROBIC AND ANAEROBIC BOTTLES YEAST CRITICAL RESULT CALLED TO, READ BACK BY  AND VERIFIED WITH: Carey Bullocks 10/29/2021 at 1137 SRR Performed at [facility], [address] [place], [place] [address]    Culture (A)  Final    CANDIDA ALBICANS CULTURE REINCUBATED FOR BETTER GROWTH Performed at [facility], [place] [address] [place], [place] [address]    Report Status PENDING  Incomplete  Blood Culture ID Panel (Reflexed)     Status: Abnormal   Collection Time: 10/28/21  8:25 PM  Result Value Ref Range Status   Enterococcus faecalis NOT DETECTED NOT DETECTED Final   Enterococcus Faecium NOT DETECTED NOT DETECTED Final   Listeria monocytogenes NOT DETECTED NOT DETECTED Final   Staphylococcus species NOT DETECTED NOT DETECTED Final   Staphylococcus aureus (BCID) NOT DETECTED NOT DETECTED Final   Staphylococcus epidermidis NOT DETECTED NOT DETECTED Final   Staphylococcus lugdunensis NOT DETECTED NOT DETECTED Final   Streptococcus species NOT DETECTED NOT DETECTED Final   Streptococcus agalactiae NOT DETECTED NOT DETECTED Final   Streptococcus pneumoniae NOT DETECTED NOT DETECTED Final   Streptococcus pyogenes NOT DETECTED NOT DETECTED Final   A.calcoaceticus-baumannii NOT DETECTED NOT DETECTED Final   Bacteroides fragilis NOT DETECTED NOT DETECTED Final   Enterobacterales NOT DETECTED NOT DETECTED Final   Enterobacter cloacae complex NOT DETECTED NOT DETECTED Final   Escherichia coli NOT DETECTED NOT DETECTED Final   Klebsiella aerogenes NOT DETECTED NOT DETECTED Final   Klebsiella oxytoca NOT DETECTED NOT DETECTED Final   Klebsiella pneumoniae NOT DETECTED NOT DETECTED Final   Proteus species NOT DETECTED NOT DETECTED Final   Salmonella species NOT DETECTED NOT DETECTED Final   Serratia marcescens NOT DETECTED NOT DETECTED Final   Haemophilus influenzae NOT DETECTED NOT DETECTED Final   Neisseria meningitidis NOT DETECTED NOT DETECTED Final   Pseudomonas aeruginosa NOT DETECTED NOT DETECTED Final   Stenotrophomonas maltophilia NOT DETECTED NOT  DETECTED Final   Candida albicans DETECTED (A) NOT DETECTED Final    Comment: CRITICAL RESULT CALLED TO, READ BACK BY AND VERIFIED WITH: Carey Bullocks 10/29/2021 at 1137 SRR    Candida auris NOT DETECTED NOT DETECTED Final   Candida glabrata NOT DETECTED NOT DETECTED Final   Candida krusei NOT DETECTED NOT DETECTED Final   Candida parapsilosis NOT DETECTED NOT DETECTED Final   Candida tropicalis NOT DETECTED NOT DETECTED Final   Cryptococcus neoformans/gattii NOT DETECTED NOT DETECTED Final    Comment: Performed at [facility], [address] [place], [place] [address]  MRSA Next Gen by PCR, Nasal     Status: None   Collection Time: 10/29/21 12:11 PM   Specimen: Nasal Mucosa; Nasal Swab  Result Value Ref Range  Status   MRSA by PCR Next Gen NOT DETECTED NOT DETECTED Final    Comment: (NOTE) The GeneXpert MRSA Assay (FDA approved for NASAL specimens only), is one component of a comprehensive MRSA colonization surveillance program. It is not intended to diagnose MRSA infection nor to guide or monitor treatment for MRSA infections. Test performance is not FDA approved in patients less than 18 years old. Performed at [facility], [address] [place], [place] [address]   Culture, blood (Routine X 2) w Reflex to ID Panel     Status: None (Preliminary result)   Collection Time: 10/29/21  2:41 PM   Specimen: BLOOD  Result Value Ref Range Status   Specimen Description BLOOD RIGHT ANTECUBITAL  Final   Special Requests   Final    BOTTLES DRAWN AEROBIC AND ANAEROBIC Blood Culture adequate volume   Culture   Final    NO GROWTH < 12 HOURS Performed at [facility], [address] [place], [place] [address]    Report Status PENDING  Incomplete  Culture, blood (Routine X 2) w Reflex to ID Panel     Status: None (Preliminary result)   Collection Time: 10/29/21  3:00 PM   Specimen: BLOOD  Result Value Ref Range Status   Specimen Description BLOOD LEFT  ANTECUBITAL  Final   Special Requests   Final    BOTTLES DRAWN AEROBIC AND ANAEROBIC Blood Culture adequate volume   Culture   Final    NO GROWTH < 12 HOURS Performed at [facility], [address] [place], [place] [address]    Report Status PENDING  Incomplete    Procedures and diagnostic studies:  CT CHEST W CONTRAST  Result Date: 10/30/2021 CLINICAL DATA:  Vocal cord paralysis, history of breast cancer with pulmonary metastasis; * Tracking Code: BO * EXAM: CT CHEST WITH CONTRAST TECHNIQUE: Multidetector CT imaging of the chest was performed during intravenous contrast administration. RADIATION DOSE REDUCTION: This exam was performed according to the departmental dose-optimization program which includes automated exposure control, adjustment of the mA and/or kV according to patient size and/or use of iterative reconstruction technique. CONTRAST:  68m OMNIPAQUE IOHEXOL 300 MG/ML  SOLN COMPARISON:  Chest CT dated August 10, 2021 FINDINGS: Cardiovascular: Normal heart size. No pericardial effusion. No coronary artery calcifications. Normal caliber thoracic aorta with mild atherosclerotic disease. Mediastinum/Nodes: Dilated esophagus with air-fluid level. Mildly heterogeneous thyroid gland which is decreased in size when compared with prior CT. No pathologically enlarged lymph nodes seen in the chest. Lungs/Pleura: Central airways are patent. Large left pleural effusion with atelectasis. Bilateral solid pulmonary nodules are decreased or no longer present, although portions of the left lung are not visualized due to large pleural effusion which somewhat limits evaluation. Reference subpleural nodule of the left upper lobe measuring 5 mm on series 4, image 17, previously measured 9 mm. Upper Abdomen: Cholelithiasis.  No acute abnormality. Musculoskeletal: Soft tissue gas of the right anterior chest wall, compatible with history of recent port removal. No acute or significant osseous findings.  IMPRESSION: 1. Large left pleural effusion with associated atelectasis. 2. Bilateral solid pulmonary nodules are decreased in size or no longer present. 3. Dilated esophagus with air-fluid level, findings can be seen in the setting of esophageal dysmotility. 4. Mildly heterogeneous thyroid gland, decreased in size when compared with prior chest CT. 5. Aortic Atherosclerosis (ICD10-I70.0). Electronically Signed   By: LYetta GlassmanM.D.   On: 10/30/2021 16:40   CT SOFT TISSUE NECK W CONTRAST  Result Date: 10/30/2021 CLINICAL DATA:  Vocal cord paralysis, history of breast cancer EXAM: CT NECK WITH CONTRAST TECHNIQUE: Multidetector CT imaging of the neck was performed using the standard protocol following the bolus administration of intravenous contrast. RADIATION DOSE REDUCTION: This exam was performed according to the departmental dose-optimization program which includes automated exposure control, adjustment of the mA and/or kV according to patient size and/or use of iterative reconstruction technique. CONTRAST:  59m OMNIPAQUE IOHEXOL 300 MG/ML  SOLN COMPARISON:  Correlation made with CTA neck from 2021 FINDINGS: Pharynx and larynx: No mass or swelling. Appearance suggestive of left vocal cord weakness or paralysis. Salivary glands: Parotid and submandibular glands are unremarkable. Thyroid: Unremarkable. Lymph nodes: A mildly enlarged left supraclavicular node was not present on the prior study. This measures 1.2 cm on series 3, image 74. Vascular: Major neck vessels are patent. Partially retropharyngeal course of the ICAs. Limited intracranial: No abnormal enhancement. 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 Visualized orbits: Unremarkable. Mastoids and visualized paranasal sinuses: No significant opacification. Skeleton: No acute osseous abnormality. Upper chest: Postprocedural changes of same day chest port removal. Refer to concurrent dedicated chest CT. Other: None. IMPRESSION: Possible left vocal cord weakness  or paralysis. Correlate with clinical exam. Indeterminate mildly enlarged left supraclavicular lymph node. Electronically Signed   By: PMacy MisM.D.   On: 10/30/2021 16:33   IR REMOVAL TUN ACCESS W/ PORT W/O FL MOD SED  Result Date: 10/30/2021 INDICATION: Port removal, infection EXAM: Port removal MEDICATIONS: Per EMR ANESTHESIA/SEDATION: Moderate (conscious) sedation was employed during this procedure. A total of Versed 1 mg and Fentanyl 50 mcg was administered intravenously. Moderate Sedation Time: 12 minutes. The patient's level of consciousness and vital signs were monitored continuously by radiology nursing throughout the procedure under my direct supervision. FLUOROSCOPY TIME:  N/a COMPLICATIONS: None immediate. PROCEDURE: Informed written consent was obtained from the patient after a thorough discussion of the procedural risks, benefits and alternatives. All questions were addressed. Maximal Sterile Barrier Technique was utilized including caps, mask, sterile gowns, sterile gloves, sterile drape, hand hygiene and skin antiseptic. A timeout was performed prior to the initiation of the procedure. The right chest was prepped and draped in the standard sterile fashion. Lidocaine was utilized for local anesthesia. An incision was made over the previously healed surgical incision. Utilizing blunt dissection, the port catheter and reservoir were removed from the underlying subcutaneous tissue in their entirety. After hemostasis, the subcutaneous pockets was closed in two layers using 3-0 and 4-0 Vicryl. Dermabond was also applied. The patient tolerated the procedure well without immediate complication. IMPRESSION: Successful removal of right-sided chest port. The catheter tip and port were sent for microbiology culture. Electronically Signed   By: YAlbin FellingM.D.   On: 10/30/2021 16:14   MR BRAIN WO CONTRAST  Result Date: 10/29/2021 CLINICAL DATA:  Headache EXAM: MRI HEAD WITHOUT CONTRAST TECHNIQUE:  Multiplanar, multiecho pulse sequences of the brain and surrounding structures were obtained without intravenous contrast. COMPARISON:  None Available. FINDINGS: Brain: No acute infarct, mass effect or extra-axial collection. No acute or chronic hemorrhage. There is multifocal hyperintense T2-weighted signal within the white matter. Parenchymal volume and CSF spaces are normal. The midline structures are normal. Vascular: Major flow voids are preserved. Skull and upper cervical spine: Normal calvarium and skull base. Visualized upper cervical spine and soft tissues are normal. Sinuses/Orbits:No paranasal sinus fluid levels or advanced mucosal thickening. No mastoid or middle ear effusion. Normal orbits. IMPRESSION: 1. No acute intracranial abnormality. 2. Multifocal hyperintense T2-weighted  signal within the white matter, most commonly seen in the setting of chronic small vessel ischemia. Electronically Signed   By: Ulyses Jarred M.D.   On: 10/29/2021 22:17   ECHOCARDIOGRAM COMPLETE  Result Date: 10/29/2021    ECHOCARDIOGRAM REPORT   Patient Name:   ODALYS WIN Date of Exam: 10/29/2021 Medical Rec #:  [id number]           Height:       64.0 in Accession #:    [id number]          Weight:       160.9 lb Date of Birth:  [birth year]           BSA:          1.784 m Patient Age:    59 years            BP:           151/76 mmHg Patient Gender: F                   HR:           90 bpm. Exam Location:  [facility] Procedure: 2D Echo, Color Doppler and Cardiac Doppler Indications:     R00.8 Other abnormalities of the heart  History:         Patient has no prior history of Echocardiogram examinations.                  Signs/Symptoms:Candidemia.  Sonographer:     Charmayne Sheer Referring Phys:  [id number] CALDWELL POWELL JR Diagnosing Phys: Kate Sable MD  Sonographer Comments: Suboptimal parasternal window and suboptimal subcostal window. IMPRESSIONS  1. Left ventricular ejection fraction, by estimation, is 50 to 55%. The left  ventricle has low normal function. The left ventricle has no regional wall motion abnormalities. There is mild left ventricular hypertrophy. Left ventricular diastolic parameters are consistent with Grade I diastolic dysfunction (impaired relaxation).  2. Right ventricular systolic function is normal. The right ventricular size is normal.  3. The mitral valve is normal in structure. No evidence of mitral valve regurgitation.  4. The aortic valve was not well visualized. Aortic valve regurgitation is not visualized.  5. The inferior vena cava is normal in size with greater than 50% respiratory variability, suggesting right atrial pressure of 3 mmHg. FINDINGS  Left Ventricle: Left ventricular ejection fraction, by estimation, is 50 to 55%. The left ventricle has low normal function. The left ventricle has no regional wall motion abnormalities. The left ventricular internal cavity size was normal in size. There is mild left ventricular hypertrophy. Left ventricular diastolic parameters are consistent with Grade I diastolic dysfunction (impaired relaxation). Right Ventricle: The right ventricular size is normal. No increase in right ventricular wall thickness. Right ventricular systolic function is normal. Left Atrium: Left atrial size was normal in size. Right Atrium: Right atrial size was normal in size. Pericardium: There is no evidence of pericardial effusion. Mitral Valve: The mitral valve is normal in structure. No evidence of mitral valve regurgitation. Tricuspid Valve: The tricuspid valve is not well visualized. Tricuspid valve regurgitation is not demonstrated. Aortic Valve: The aortic valve was not well visualized. Aortic valve regurgitation is not visualized. Aortic valve mean gradient measures 3.0 mmHg. Aortic valve peak gradient measures 6.6 mmHg. Aortic valve area, by VTI measures 2.27 cm. Pulmonic Valve: The pulmonic valve was not well visualized. Pulmonic valve regurgitation is not visualized. Aorta:  The aortic root and ascending aorta are structurally  normal, with no evidence of dilitation. Venous: The inferior vena cava is normal in size with greater than 50% respiratory variability, suggesting right atrial pressure of 3 mmHg. IAS/Shunts: No atrial level shunt detected by color flow Doppler.  LEFT VENTRICLE PLAX 2D LVIDd:         3.00 cm   Diastology LVIDs:         2.51 cm   LV e' medial:    4.68 cm/s LV PW:         1.12 cm   LV E/e' medial:  15.0 LV IVS:        0.73 cm   LV e' lateral:   5.22 cm/s LVOT diam:     1.90 cm   LV E/e' lateral: 13.4 LV SV:         48 LV SV Index:   27 LVOT Area:     2.84 cm  RIGHT VENTRICLE RV Basal diam:  2.46 cm LEFT ATRIUM             Index        RIGHT ATRIUM          Index LA diam:        3.20 cm 1.79 cm/m   RA Area:     8.72 cm LA Vol (A2C):   24.0 ml 13.45 ml/m  RA Volume:   16.10 ml 9.03 ml/m LA Vol (A4C):   59.9 ml 33.58 ml/m LA Biplane Vol: 41.9 ml 23.49 ml/m  AORTIC VALVE                    PULMONIC VALVE AV Area (Vmax):    2.26 cm     PV Vmax:       0.99 m/s AV Area (Vmean):   2.33 cm     PV Peak grad:  3.9 mmHg AV Area (VTI):     2.27 cm AV Vmax:           128.00 cm/s AV Vmean:          83.700 cm/s AV VTI:            0.212 m AV Peak Grad:      6.6 mmHg AV Mean Grad:      3.0 mmHg LVOT Vmax:         102.00 cm/s LVOT Vmean:        68.700 cm/s LVOT VTI:          0.170 m LVOT/AV VTI ratio: 0.80  AORTA Ao Root diam: 3.40 cm MITRAL VALVE MV Area (PHT): 10.18 cm    SHUNTS MV Decel Time: 75 msec      Systemic VTI:  0.17 m MV E velocity: 70.05 cm/s   Systemic Diam: 1.90 cm MV A velocity: 117.50 cm/s MV E/A ratio:  0.60 Kate Sable MD Electronically signed by Kate Sable MD Signature Date/Time: 10/29/2021/3:32:34 PM    Final                LOS: 2 days   Pati Thinnes  [facility]   Pager on [email or web address]. If 7PM-7AM, please contact night-coverage at [email or web address]     10/30/2021, 4:55 PM

## 2021-10-30 NOTE — Progress Notes (Signed)
SLP Cancellation Note  Patient Details Name: Jill Rodriguez MRN: [id number] DOB: 01-22-1961   Cancelled treatment:       Reason Eval/Treat Not Completed: Patient at procedure or test/unavailable (chart reviewed; consulted Radiology. Pt is NPO and scheduled for procedure(port removal) per Radiology.) ST services will f/u w/ [facility]SS scheduled for tomorrow morning now. See order placed. Recommend frequent oral care for hygiene and stimulation of swallow especially in setting of N/V. Updated NSG.     Orinda Kenner, MS, CCC-SLP Speech Language Pathologist Re[facility] Services; [place] [phone] ([email or web address]) Jill Rodriguez 10/30/2021, 3:05 PM

## 2021-10-31 ENCOUNTER — Inpatient Hospital Stay: Payer: BLUE CROSS/BLUE SHIELD

## 2021-10-31 ENCOUNTER — Encounter
Admission: EM | Disposition: A | Discharge status: Home Health | Payer: Self-pay | Source: Home / Self Care | Attending: Internal Medicine

## 2021-10-31 DIAGNOSIS — R112 Nausea with vomiting, unspecified: Secondary | ICD-10-CM | POA: Diagnosis not present

## 2021-10-31 DIAGNOSIS — Z9889 Other specified postprocedural states: Secondary | ICD-10-CM | POA: Diagnosis not present

## 2021-10-31 DIAGNOSIS — N179 Acute kidney failure, unspecified: Secondary | ICD-10-CM | POA: Diagnosis not present

## 2021-10-31 DIAGNOSIS — R930 Abnormal findings on diagnostic imaging of skull and head, not elsewhere classified: Secondary | ICD-10-CM | POA: Diagnosis not present

## 2021-10-31 DIAGNOSIS — D701 Agranulocytosis secondary to cancer chemotherapy: Secondary | ICD-10-CM | POA: Diagnosis not present

## 2021-10-31 DIAGNOSIS — I4581 Long QT syndrome: Secondary | ICD-10-CM

## 2021-10-31 DIAGNOSIS — B377 Candidal sepsis: Secondary | ICD-10-CM | POA: Diagnosis not present

## 2021-10-31 DIAGNOSIS — R9431 Abnormal electrocardiogram [ECG] [EKG]: Secondary | ICD-10-CM | POA: Diagnosis not present

## 2021-10-31 DIAGNOSIS — J90 Pleural effusion, not elsewhere classified: Secondary | ICD-10-CM

## 2021-10-31 LAB — BASIC METABOLIC PANEL
Anion gap: 7 (ref 5–15)
BUN: 14 mg/dL (ref 6–20)
CO2: 27 mmol/L (ref 22–32)
Calcium: 8.3 mg/dL — ABNORMAL LOW (ref 8.9–10.3)
Chloride: 102 mmol/L (ref 98–111)
Creatinine, Ser: 0.94 mg/dL (ref 0.44–1.00)
GFR, Estimated: >60 mL/min (ref >60)
Glucose, Bld: 77 mg/dL (ref 70–99)
Potassium: 3.8 mmol/L (ref 3.5–5.1)
Sodium: 136 mmol/L (ref 135–145)

## 2021-10-31 LAB — BODY FLUID CELL COUNT WITH DIFFERENTIAL
Eos, Fluid: 0 %
Lymphs, Fluid: 65 %
Monocyte-Macrophage-Serous Fluid: 32 %
Neutrophil Count, Fluid: 3 %
Total Nucleated Cell Count, Fluid: 382 cu mm

## 2021-10-31 LAB — GLUCOSE, PLEURAL OR PERITONEAL FLUID: Glucose, Fluid: 77 mg/dL

## 2021-10-31 LAB — LACTATE DEHYDROGENASE, PLEURAL OR PERITONEAL FLUID: LD, Fluid: 129 U/L — ABNORMAL HIGH (ref 3–23)

## 2021-10-31 LAB — URINE CULTURE: Culture: >=100000 — AB

## 2021-10-31 LAB — LACTATE DEHYDROGENASE: LDH: 228 U/L — ABNORMAL HIGH (ref 98–192)

## 2021-10-31 LAB — PROTEIN, PLEURAL OR PERITONEAL FLUID: Total protein, fluid: <3 g/dL

## 2021-10-31 LAB — PHOSPHORUS: Phosphorus: 2.3 mg/dL — ABNORMAL LOW (ref 2.5–4.6)

## 2021-10-31 LAB — MAGNESIUM: Magnesium: 1.7 mg/dL (ref 1.7–2.4)

## 2021-10-31 SURGERY — ECHOCARDIOGRAM, TRANSESOPHAGEAL
Anesthesia: Moderate Sedation

## 2021-10-31 MED ORDER — MAGNESIUM SULFATE 2 GM/50ML IV SOLN
2.0000 g | Freq: Once | INTRAVENOUS | Status: AC
  Administered 2021-10-31: 2 g via INTRAVENOUS
  Filled 2021-10-31: qty 50

## 2021-10-31 MED ORDER — POTASSIUM PHOSPHATES 15 MMOLE/5ML IV SOLN
15.0000 mmol | Freq: Once | INTRAVENOUS | Status: AC
  Administered 2021-10-31: 15 mmol via INTRAVENOUS
  Filled 2021-10-31: qty 5

## 2021-10-31 NOTE — Progress Notes (Addendum)
Hematology/Oncology Progress note [phone] [phone]     Patient Care Team: Pcp, No as PCP - General Clent Jacks, RN as Oncology Nurse Navigator Earlie Server, MD as Consulting Physician (Hematology and Oncology) Mellody Drown, MD as Consulting Physician (Gynecologic Oncology) Shirline Frees, MD as Consulting Physician (Radiation Oncology) Noreene Filbert, MD as Consulting Physician (Radiation Oncology)   Name of the patient: Jill Rodriguez  [id number]  1961/01/16  Date of visit: 10/31/21   INTERVAL HISTORY-   10/30/2021, CT soft tissue neck with contrast showed left vocal cord weakness/paralysis.  Indeterminate mildly enlarged left supraclavicular lymph node. CT chest with contrast showed large left pleural effusion with associated atelectasis.  Bilateral solid pulmonary nodules were decreased in size no new longer present.  Dilated esophagus with air-fluid level.  Mildly heterogeneous thyroid gland decreased in size when compared with prior CT.  Aortic atherosclerosis.  Patient is status post right side thoracentesis, removed 500 cc fluid.  Fluid study pending. + Cough, which triggers her to throw up.   No Known Allergies  Patient Active Problem List   Diagnosis Date Noted   Pleural effusion on left 10/31/2021   Vocal cord paralysis 10/30/2021   Candidemia ([place]) 10/29/2021   Abnormal EKG 10/29/2021   Abnormal CT of the head 10/29/2021   Neutropenic fever ([place]) 10/28/2021   Pyuria 10/28/2021   AKI (acute kidney injury) ([place]) 10/28/2021   Pancytopenia due to antineoplastic chemotherapy ([place]) 10/28/2021   HCAP (healthcare-associated pneumonia) 10/28/2021   Cancer with pulmonary metastases ([place]) 08/27/2021   Chronic cough 07/25/2021   Dysuria 07/31/2020   Chemotherapy-induced neutropenia ([place]) 06/14/2020   Hypocalcemia 06/14/2020   Normocytic anemia 06/14/2020   Thoracic lymphadenopathy 05/20/2020   Chemotherapy induced nausea and  vomiting 10/31/2019   Heartburn 10/17/2019   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 ([place]) 10/10/2019   Encounter for antineoplastic chemotherapy 10/10/2019   Non-intractable vomiting 10/10/2019   Thrush 10/10/2019   Goals of care, counseling/discussion 09/20/2019   Malignant neoplasm of cervix ([place]) 08/30/2019     Past Medical History:  Diagnosis Date   Cancer ([place])    cervical CA, that spread to lymph nodes; last chemo was 12/08/2019; radiation 01/2020   COVID-19 10/2018   Nausea with vomiting 10/31/2019   Neoplasm related pain    No pertinent past medical history      Past Surgical History:  Procedure Laterality Date   ESOPHAGOGASTRODUODENOSCOPY (EGD) WITH PROPOFOL N/A 03/28/2020   Procedure: ESOPHAGOGASTRODUODENOSCOPY (EGD) WITH PROPOFOL;  Surgeon: Milus Banister, MD;  Location: [facility];  Service: Endoscopy;  Laterality: N/A;   EUS N/A 03/28/2020   Procedure: UPPER ENDOSCOPIC ULTRASOUND (EUS) RADIAL;  Surgeon: Milus Banister, MD;  Location: [facility];  Service: Endoscopy;  Laterality: N/A;   IR REMOVAL TUN ACCESS W/ PORT W/O FL MOD SED  10/30/2021   no surgical history      Social History   Socioeconomic History   Marital status: Married    Spouse name: Rosezetta Schlatter    Number of children: 3   Years of education: Not on file   Highest education level: Not on file  Occupational History   Not on file  Tobacco Use   Smoking status: Never   Smokeless tobacco: Never  Vaping Use   Vaping Use: Never used  Substance and Sexual Activity   Alcohol use: Never   Drug use: Never   Sexual activity: Yes    Birth control/protection: None  Other Topics Concern  Not on file  Social History Narrative   Lives at home with spouse ; daughter [name] comes & stays when needed   Social Determinants of Health   Financial Resource Strain: Not on file  Food Insecurity: Not on file  Transportation Needs: Not on file  Physical Activity: Not on file  Stress: Not on  file  Social Connections: Not on file  Intimate Partner Violence: Not on file     Family History  Problem Relation Age of Onset   Blindness Mother    Glaucoma Mother    Diabetes Father    Diabetes Brother      Current Facility-Administered Medications:    0.9 %  sodium chloride infusion, , Intravenous, Continuous, Theora Gianotti, NP, Last Rate: 20 mL/hr at 10/31/21 1501, Restarted at 10/31/21 1501   acetaminophen (TYLENOL) tablet 650 mg, 650 mg, Oral, Q6H PRN, 650 mg at 10/29/21 1123 **OR** acetaminophen (TYLENOL) suppository 650 mg, 650 mg, Rectal, Q6H PRN, Athena Masse, MD   Chlorhexidine Gluconate Cloth 2 % PADS 6 each, 6 each, Topical, Q0600, Elodia Florence., MD, 6 each at 10/31/21 [phone]   feeding supplement (BOOST / RESOURCE BREEZE) liquid 1 Container, 1 Container, Oral, TID BM, Elodia Florence., MD, 1 Container at 10/30/21 0831   fluconazole (DIFLUCAN) IVPB 400 mg, 400 mg, Intravenous, Q24H, Elodia Florence., MD, Last Rate: 100 mL/hr at 10/31/21 1501, Infusion Verify at 10/31/21 1501   guaiFENesin-dextromethorphan (ROBITUSSIN DM) 100-10 MG/5ML syrup 5 mL, 5 mL, Oral, Q4H PRN, Elodia Florence., MD, 5 mL at 10/29/21 2155   LORazepam (ATIVAN) tablet 0.5 mg, 0.5 mg, Oral, TID, Knox Royalty, NP, 0.5 mg at 10/30/21 2259   morphine (PF) 2 MG/ML injection 2 mg, 2 mg, Intravenous, Q2H PRN, Athena Masse, MD, 2 mg at 10/31/21 1323   multivitamin with minerals tablet 1 tablet, 1 tablet, Oral, Daily, Elodia Florence., MD, 1 tablet at 10/30/21 0830   ondansetron (ZOFRAN) tablet 4 mg, 4 mg, Oral, Q6H PRN **OR** ondansetron (ZOFRAN) injection 4 mg, 4 mg, Intravenous, Q6H PRN, Judd Gaudier V, MD, 4 mg at 10/31/21 1304   Oral care mouth rinse, 15 mL, Mouth Rinse, 4 times per day, Elodia Florence., MD, 15 mL at 10/31/21 1324   Oral care mouth rinse, 15 mL, Mouth Rinse, PRN, Elodia Florence., MD   potassium PHOSPHATE 15 mmol in dextrose 5 %  250 mL infusion, 15 mmol, Intravenous, Once, Jennye Boroughs, MD   promethazine (PHENERGAN) 12.5 mg in sodium chloride 0.9 % 50 mL IVPB, 12.5 mg, Intravenous, Q6H PRN, Athena Masse, MD, Stopped at 10/30/21 1702   scopolamine (TRANSDERM-SCOP) 1 MG/3DAYS 1.5 mg, 1 patch, Transdermal, Q72H, Athena Masse, MD, 1.5 mg at 10/28/21 2337   Physical exam:  Vitals:   10/31/21 0521 10/31/21 0746 10/31/21 1200 10/31/21 1232  BP: (!) 150/85 (!) 160/88 (!) 151/81 (!) 148/63  Pulse: 95 97 (!) 103 (!) 110  Resp: 18 18    Temp: 98.4 F (36.9 C) 98.8 F (37.1 C)    TempSrc: Oral     SpO2: 94% 92% 98% 100%  Weight:      Height:       Physical Exam Constitutional:      Appearance: She is ill-appearing.  HENT:     Head: Normocephalic.     Mouth/Throat:     Mouth: Mucous membranes are moist.  Eyes:     General: No  scleral icterus. Cardiovascular:     Rate and Rhythm: Tachycardia present.  Pulmonary:     Effort: Pulmonary effort is normal. No respiratory distress.  Abdominal:     General: There is no distension.  Musculoskeletal:        General: Normal range of motion.     Cervical back: Normal range of motion.  Skin:    General: Skin is warm.     Coloration: Skin is not jaundiced.  Neurological:     Mental Status: She is alert. Mental status is at baseline.  Psychiatric:        Mood and Affect: Mood normal.           Latest Ref Rng & Units 10/31/2021    3:49 AM  CMP  Glucose 70 - 99 mg/dL 77   BUN 6 - 20 mg/dL 14   Creatinine 0.44 - 1.00 mg/dL 0.94   Sodium 135 - 145 mmol/L 136   Potassium 3.5 - 5.1 mmol/L 3.8   Chloride 98 - 111 mmol/L 102   CO2 22 - 32 mmol/L 27   Calcium 8.9 - 10.3 mg/dL 8.3       Latest Ref Rng & Units 10/30/2021    5:58 PM  CBC  WBC 4.0 - 10.5 K/uL 3.8   Hemoglobin 12.0 - 15.0 g/dL 7.9   Hematocrit 36.0 - 46.0 % 23.4   Platelets 150 - 400 K/uL 39     RADIOGRAPHIC STUDIES: I have personally reviewed the radiological images as listed and agreed  with the findings in the report. US THORACENTESIS ASP PLEURAL SPACE W/IMG GUIDE  Result Date: 10/31/2021 INDICATION: Left pleural effusion EXAM: ULTRASOUND GUIDED LEFT THORACENTESIS MEDICATIONS: 8 cc 1% lidocaine COMPLICATIONS: None immediate. PROCEDURE: An ultrasound guided thoracentesis was thoroughly discussed with the patient and questions answered. The benefits, risks, alternatives and complications were also discussed. The patient understands and wishes to proceed with the procedure. Written consent was obtained. Ultrasound was performed to localize and mark an adequate pocket of fluid in the left chest. The area was then prepped and draped in the normal sterile fashion. 1% Lidocaine was used for local anesthesia. Under ultrasound guidance a 6 Fr Safe-T-Centesis catheter was introduced. Thoracentesis was performed. The catheter was removed and a dressing applied. FINDINGS: A total of approximately 500 mL of yellow fluid was removed. Samples were sent to the laboratory as requested by the clinical team IMPRESSION: Successful ultrasound guided left thoracentesis yielding 500 mL of pleural fluid. Follow-up chest x-ray revealed no pneumothorax. Read by: Reatha Armour, PA-C Electronically Signed   By: Corrie Mckusick D.O.   On: 10/31/2021 13:10   DG Chest Port 1 View  Result Date: 10/31/2021 CLINICAL DATA:  Status post thoracentesis. EXAM: PORTABLE CHEST 1 VIEW COMPARISON:  10/28/2021. FINDINGS: Large left pleural effusion with overlying left basilar opacities. Right lung is clear. Prominent right-sided nipple shadow. No visible pneumothorax. Cardiomediastinal silhouette is partially obscured, but enlarged. Contrast and partially imaged stomach and bowel. IMPRESSION: Large left pleural effusion with overlying left basilar opacities. No visible pneumothorax. Electronically Signed   By: Margaretha Sheffield M.D.   On: 10/31/2021 12:51   CT CHEST W CONTRAST  Result Date: 10/30/2021 CLINICAL DATA:  Vocal cord  paralysis, history of breast cancer with pulmonary metastasis; * Tracking Code: BO * EXAM: CT CHEST WITH CONTRAST TECHNIQUE: Multidetector CT imaging of the chest was performed during intravenous contrast administration. RADIATION DOSE REDUCTION: This exam was performed according to the departmental dose-optimization program which includes  automated exposure control, adjustment of the mA and/or kV according to patient size and/or use of iterative reconstruction technique. CONTRAST:  63m OMNIPAQUE IOHEXOL 300 MG/ML  SOLN COMPARISON:  Chest CT dated August 10, 2021 FINDINGS: Cardiovascular: Normal heart size. No pericardial effusion. No coronary artery calcifications. Normal caliber thoracic aorta with mild atherosclerotic disease. Mediastinum/Nodes: Dilated esophagus with air-fluid level. Mildly heterogeneous thyroid gland which is decreased in size when compared with prior CT. No pathologically enlarged lymph nodes seen in the chest. Lungs/Pleura: Central airways are patent. Large left pleural effusion with atelectasis. Bilateral solid pulmonary nodules are decreased or no longer present, although portions of the left lung are not visualized due to large pleural effusion which somewhat limits evaluation. Reference subpleural nodule of the left upper lobe measuring 5 mm on series 4, image 17, previously measured 9 mm. Upper Abdomen: Cholelithiasis.  No acute abnormality. Musculoskeletal: Soft tissue gas of the right anterior chest wall, compatible with history of recent port removal. No acute or significant osseous findings. IMPRESSION: 1. Large left pleural effusion with associated atelectasis. 2. Bilateral solid pulmonary nodules are decreased in size or no longer present. 3. Dilated esophagus with air-fluid level, findings can be seen in the setting of esophageal dysmotility. 4. Mildly heterogeneous thyroid gland, decreased in size when compared with prior chest CT. 5. Aortic Atherosclerosis (ICD10-I70.0).  Electronically Signed   By: LYetta GlassmanM.D.   On: 10/30/2021 16:40   CT SOFT TISSUE NECK W CONTRAST  Result Date: 10/30/2021 CLINICAL DATA:  Vocal cord paralysis, history of breast cancer EXAM: CT NECK WITH CONTRAST TECHNIQUE: Multidetector CT imaging of the neck was performed using the standard protocol following the bolus administration of intravenous contrast. RADIATION DOSE REDUCTION: This exam was performed according to the departmental dose-optimization program which includes automated exposure control, adjustment of the mA and/or kV according to patient size and/or use of iterative reconstruction technique. CONTRAST:  750mOMNIPAQUE IOHEXOL 300 MG/ML  SOLN COMPARISON:  Correlation made with CTA neck from 2021 FINDINGS: Pharynx and larynx: No mass or swelling. Appearance suggestive of left vocal cord weakness or paralysis. Salivary glands: Parotid and submandibular glands are unremarkable. Thyroid: Unremarkable. Lymph nodes: A mildly enlarged left supraclavicular node was not present on the prior study. This measures 1.2 cm on series 3, image 74. Vascular: Major neck vessels are patent. Partially retropharyngeal course of the ICAs. Limited intracranial: No abnormal enhancement. 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 Visualized orbits: Unremarkable. Mastoids and visualized paranasal sinuses: No significant opacification. Skeleton: No acute osseous abnormality. Upper chest: Postprocedural changes of same day chest port removal. Refer to concurrent dedicated chest CT. Other: None. IMPRESSION: Possible left vocal cord weakness or paralysis. Correlate with clinical exam. Indeterminate mildly enlarged left supraclavicular lymph node. Electronically Signed   By: PrMacy Mis.D.   On: 10/30/2021 16:33   IR REMOVAL TUN ACCESS W/ PORT W/O FL MOD SED  Result Date: 10/30/2021 INDICATION: Port removal, infection EXAM: Port removal MEDICATIONS: Per EMR ANESTHESIA/SEDATION: Moderate (conscious) sedation was employed  during this procedure. A total of Versed 1 mg and Fentanyl 50 mcg was administered intravenously. Moderate Sedation Time: 12 minutes. The patient's level of consciousness and vital signs were monitored continuously by radiology nursing throughout the procedure under my direct supervision. FLUOROSCOPY TIME:  N/a COMPLICATIONS: None immediate. PROCEDURE: Informed written consent was obtained from the patient after a thorough discussion of the procedural risks, benefits and alternatives. All questions were addressed. Maximal Sterile Barrier Technique was utilized including caps, mask, sterile gowns,  sterile gloves, sterile drape, hand hygiene and skin antiseptic. A timeout was performed prior to the initiation of the procedure. The right chest was prepped and draped in the standard sterile fashion. Lidocaine was utilized for local anesthesia. An incision was made over the previously healed surgical incision. Utilizing blunt dissection, the port catheter and reservoir were removed from the underlying subcutaneous tissue in their entirety. After hemostasis, the subcutaneous pockets was closed in two layers using 3-0 and 4-0 Vicryl. Dermabond was also applied. The patient tolerated the procedure well without immediate complication. IMPRESSION: Successful removal of right-sided chest port. The catheter tip and port were sent for microbiology culture. Electronically Signed   By: Albin Felling M.D.   On: 10/30/2021 16:14   MR BRAIN WO CONTRAST  Result Date: 10/29/2021 CLINICAL DATA:  Headache EXAM: MRI HEAD WITHOUT CONTRAST TECHNIQUE: Multiplanar, multiecho pulse sequences of the brain and surrounding structures were obtained without intravenous contrast. COMPARISON:  None Available. FINDINGS: Brain: No acute infarct, mass effect or extra-axial collection. No acute or chronic hemorrhage. There is multifocal hyperintense T2-weighted signal within the white matter. Parenchymal volume and CSF spaces are normal. The midline  structures are normal. Vascular: Major flow voids are preserved. Skull and upper cervical spine: Normal calvarium and skull base. Visualized upper cervical spine and soft tissues are normal. Sinuses/Orbits:No paranasal sinus fluid levels or advanced mucosal thickening. No mastoid or middle ear effusion. Normal orbits. IMPRESSION: 1. No acute intracranial abnormality. 2. Multifocal hyperintense T2-weighted signal within the white matter, most commonly seen in the setting of chronic small vessel ischemia. Electronically Signed   By: Ulyses Jarred M.D.   On: 10/29/2021 22:17   ECHOCARDIOGRAM COMPLETE  Result Date: 10/29/2021    ECHOCARDIOGRAM REPORT   Patient Name:   Jill Rodriguez Date of Exam: 10/29/2021 Medical Rec #:  [id number]           Height:       64.0 in Accession #:    [id number]          Weight:       160.9 lb Date of Birth:  29-Sep-1960           BSA:          1.784 m Patient Age:    70 years            BP:           151/76 mmHg Patient Gender: F                   HR:           90 bpm. Exam Location:  [facility] Procedure: 2D Echo, Color Doppler and Cardiac Doppler Indications:     R00.8 Other abnormalities of the heart  History:         Patient has no prior history of Echocardiogram examinations.                  Signs/Symptoms:Candidemia.  Sonographer:     Charmayne Sheer Referring Phys:  [id number] CALDWELL POWELL JR Diagnosing Phys: Kate Sable MD  Sonographer Comments: Suboptimal parasternal window and suboptimal subcostal window. IMPRESSIONS  1. Left ventricular ejection fraction, by estimation, is 50 to 55%. The left ventricle has low normal function. The left ventricle has no regional wall motion abnormalities. There is mild left ventricular hypertrophy. Left ventricular diastolic parameters are consistent with Grade I diastolic dysfunction (impaired relaxation).  2. Right ventricular systolic function is normal. The right ventricular size  is normal.  3. The mitral valve is normal in structure. No  evidence of mitral valve regurgitation.  4. The aortic valve was not well visualized. Aortic valve regurgitation is not visualized.  5. The inferior vena cava is normal in size with greater than 50% respiratory variability, suggesting right atrial pressure of 3 mmHg. FINDINGS  Left Ventricle: Left ventricular ejection fraction, by estimation, is 50 to 55%. The left ventricle has low normal function. The left ventricle has no regional wall motion abnormalities. The left ventricular internal cavity size was normal in size. There is mild left ventricular hypertrophy. Left ventricular diastolic parameters are consistent with Grade I diastolic dysfunction (impaired relaxation). Right Ventricle: The right ventricular size is normal. No increase in right ventricular wall thickness. Right ventricular systolic function is normal. Left Atrium: Left atrial size was normal in size. Right Atrium: Right atrial size was normal in size. Pericardium: There is no evidence of pericardial effusion. Mitral Valve: The mitral valve is normal in structure. No evidence of mitral valve regurgitation. Tricuspid Valve: The tricuspid valve is not well visualized. Tricuspid valve regurgitation is not demonstrated. Aortic Valve: The aortic valve was not well visualized. Aortic valve regurgitation is not visualized. Aortic valve mean gradient measures 3.0 mmHg. Aortic valve peak gradient measures 6.6 mmHg. Aortic valve area, by VTI measures 2.27 cm. Pulmonic Valve: The pulmonic valve was not well visualized. Pulmonic valve regurgitation is not visualized. Aorta: The aortic root and ascending aorta are structurally normal, with no evidence of dilitation. Venous: The inferior vena cava is normal in size with greater than 50% respiratory variability, suggesting right atrial pressure of 3 mmHg. IAS/Shunts: No atrial level shunt detected by color flow Doppler.  LEFT VENTRICLE PLAX 2D LVIDd:         3.00 cm   Diastology LVIDs:         2.51 cm   LV e'  medial:    4.68 cm/s LV PW:         1.12 cm   LV E/e' medial:  15.0 LV IVS:        0.73 cm   LV e' lateral:   5.22 cm/s LVOT diam:     1.90 cm   LV E/e' lateral: 13.4 LV SV:         48 LV SV Index:   27 LVOT Area:     2.84 cm  RIGHT VENTRICLE RV Basal diam:  2.46 cm LEFT ATRIUM             Index        RIGHT ATRIUM          Index LA diam:        3.20 cm 1.79 cm/m   RA Area:     8.72 cm LA Vol (A2C):   24.0 ml 13.45 ml/m  RA Volume:   16.10 ml 9.03 ml/m LA Vol (A4C):   59.9 ml 33.58 ml/m LA Biplane Vol: 41.9 ml 23.49 ml/m  AORTIC VALVE                    PULMONIC VALVE AV Area (Vmax):    2.26 cm     PV Vmax:       0.99 m/s AV Area (Vmean):   2.33 cm     PV Peak grad:  3.9 mmHg AV Area (VTI):     2.27 cm AV Vmax:           128.00 cm/s AV Vmean:  83.700 cm/s AV VTI:            0.212 m AV Peak Grad:      6.6 mmHg AV Mean Grad:      3.0 mmHg LVOT Vmax:         102.00 cm/s LVOT Vmean:        68.700 cm/s LVOT VTI:          0.170 m LVOT/AV VTI ratio: 0.80  AORTA Ao Root diam: 3.40 cm MITRAL VALVE MV Area (PHT): 10.18 cm    SHUNTS MV Decel Time: 75 msec      Systemic VTI:  0.17 m MV E velocity: 70.05 cm/s   Systemic Diam: 1.90 cm MV A velocity: 117.50 cm/s MV E/A ratio:  0.60 Kate Sable MD Electronically signed by Kate Sable MD Signature Date/Time: 10/29/2021/3:32:34 PM    Final    DG Chest Portable 1 View  Result Date: 10/28/2021 CLINICAL DATA:  Cough EXAM: PORTABLE CHEST 1 VIEW COMPARISON:  08/10/2021 FINDINGS: Interval placement of a right-sided chest port with distal tip terminating near the level of the superior cavoatrial junction. Low lung volumes with chronic elevation of the left hemidiaphragm. Heart size and mediastinal contours are largely obscured. Dense left basilar opacification may reflect atelectasis, consolidation, and or effusion. No pneumothorax. IMPRESSION: Low lung volumes with chronic elevation of the left hemidiaphragm. Dense left basilar opacification may reflect  atelectasis, consolidation, and/or effusion. Electronically Signed   By: Davina Poke D.O.   On: 10/28/2021 16:42   CT HEAD WO CONTRAST  Result Date: 10/28/2021 CLINICAL DATA:  61 year old female with headache following injury. Initial encounter. EXAM: CT HEAD WITHOUT CONTRAST TECHNIQUE: Contiguous axial images were obtained from the base of the skull through the vertex without intravenous contrast. RADIATION DOSE REDUCTION: This exam was performed according to the departmental dose-optimization program which includes automated exposure control, adjustment of the mA and/or kV according to patient size and/or use of iterative reconstruction technique. COMPARISON:  None Available. FINDINGS: Brain: No evidence of acute infarction, hemorrhage, hydrocephalus, extra-axial collection or mass lesion/mass effect. The cerebellar tonsils appear low lying. Vascular: Carotid and vertebral atherosclerotic calcifications are noted. Skull: Normal. Negative for fracture or focal lesion. Sinuses/Orbits: No acute finding. Other: None. IMPRESSION: 1. No evidence of acute intracranial abnormality. 2. 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 Consider MRI for further evaluation as indicated. Electronically Signed   By: Margarette Canada M.D.   On: 10/28/2021 16:03    Assessment and plan-   #Chemotherapy-induced neutropenia, Candidemia,  Status post 3 days of-Granix 480 mcg daily.  ANC has completely normalized. ID recommendation reviewed. Status post Mediport removal, and need TEE to rule out endocarditis.   #Anemia and thrombocytopenia secondary to recent chemotherapy. Stable.  Continue monitor.  transfusion if hemoglobin drops below 7 or platelet count drops below 10,000 or active bleeding.   #Metastatic squamous cell carcinoma of cervix Her current oncology care was at [facility].  Last chemotherapy 10/17/2021, treatment was complicated with nausea vomiting/UTI/cytopenia. CT chest and soft tissue neck showed  positive treatment response. I recommend patient to follow-up outpatient with [facility] oncology after discharge.  #Vocal cord paralysis, ENT evaluation was reviewed.  Current imaging did not demonstrate infiltrative lesion in the thyroid.  Possible secondary to positive treatment response. No intervention needed at this point per ENT.  Speech swallow evaluation.  #Large right pleural effusion, status postthoracentesis.  Fluid study pending.  Exudate according to lights criteria. Echocardiogram is done and result is pending.  Online Spanish  interpreter service was utilized during this encounter. Thank you for allowing me to participate in the care of this patient.  Dr. Rogue Bussing is on-call over the weekend.  Earlie Server, MD, PhD Hematology Oncology  10/31/2021

## 2021-10-31 NOTE — Progress Notes (Signed)
ID Pt had left pleurocentesis today and 500cc fluid removed No fever Still has severe cough which makes her throw up but only clear saliva No fever  O/e   Tired, chronically ill Patient Vitals for the past 24 hrs:  BP Temp Temp src Pulse Resp SpO2  10/31/21 1549 (!) 153/87 98 F (36.7 C) Oral 91 18 98 %  10/31/21 1232 (!) 148/63 -- -- (!) 110 -- 100 %  10/31/21 1200 (!) 151/81 -- -- (!) 103 -- 98 %  10/31/21 0746 (!) 160/88 98.8 F (37.1 C) -- 97 18 92 %  10/31/21 0521 (!) 150/85 98.4 F (36.9 C) Oral 95 18 94 %  10/30/21 2112 129/69 99 F (37.2 C) -- 92 16 97 %    Chest Previous port site covered with dressing Chest b/l air entry [id number] Abd soft Cns non focal  Labs    Latest Ref Rng & Units 10/30/2021    5:58 PM 10/30/2021    5:14 AM 10/29/2021    5:54 AM  CBC  WBC 4.0 - 10.5 K/uL 3.8  2.5  1.0    1.0   Hemoglobin 12.0 - 15.0 g/dL 7.9  8.1  7.9    7.8   Hematocrit 36.0 - 46.0 % 23.4  24.0  23.6    23.3   Platelets 150 - 400 K/uL 39  43  47    50         Latest Ref Rng & Units 10/31/2021    3:49 AM 10/30/2021    5:14 AM 10/29/2021    5:54 AM  CMP  Glucose 70 - 99 mg/dL 77  90  90   BUN 6 - 20 mg/dL '14  14  18   '$ Creatinine 0.44 - 1.00 mg/dL 0.94  0.85  0.85   Sodium 135 - 145 mmol/L 136  134  137   Potassium 3.5 - 5.1 mmol/L 3.8  3.3  3.4   Chloride 98 - 111 mmol/L 102  102  104   CO2 22 - 32 mmol/L '27  26  28   '$ Calcium 8.9 - 10.3 mg/dL 8.3  8.4  8.4   Total Protein 6.5 - 8.1 g/dL  6.1    Total Bilirubin 0.3 - 1.2 mg/dL  1.0    Alkaline Phos 38 - 126 U/L  64    AST 15 - 41 U/L  24    ALT 0 - 44 U/L  30       Micro 10/28/21- BC candida albicans 7/5/BC - NG 7/6 BC NG 7/7 body fluid  Pleural fluid cell count 382 LD 129 Protein < 3  Impression/recommendation  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 PORT has been removed, need to r/o endocarditis- 2 d echo non revealing Needs TEE  Chronic cough- likely she is aspirating from the dilated esophagus due  to esophageal dysmotility  Left vocal cord paralysis- swallow study doen- result pending  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

## 2021-10-31 NOTE — Procedures (Signed)
PROCEDURE SUMMARY:  Successful image-guided left thoracentesis. Yielded 566m of yellow fluid. Pt tolerated procedure well. No immediate complications. EBL = trace   Specimen was sent for labs. CXR ordered.  Please see imaging section of Epic for full dictation.  MLura EmPA-C 10/31/2021 12:50 PM

## 2021-10-31 NOTE — Consult Note (Signed)
Jill, Rodriguez [id number] [birth year] Jill Nearing, MD  Consulting Physician: Jill Nearing, MD  CT results reviewed   MEDICAL DECISION MAKING: Data Review:  . CT CHEST W CONTRAST  Result Date: 10/30/2021 CLINICAL DATA:  Vocal cord paralysis, history of breast cancer with pulmonary metastasis; * Tracking Code: BO * EXAM: CT CHEST WITH CONTRAST TECHNIQUE: Multidetector CT imaging of the chest was performed during intravenous contrast administration. RADIATION DOSE REDUCTION: This exam was performed according to the departmental dose-optimization program which includes automated exposure control, adjustment of the mA and/or kV according to patient size and/or use of iterative reconstruction technique. CONTRAST:  76m OMNIPAQUE IOHEXOL 300 MG/ML  SOLN COMPARISON:  Chest CT dated August 10, 2021 FINDINGS: Cardiovascular: Normal heart size. No pericardial effusion. No coronary artery calcifications. Normal caliber thoracic aorta with mild atherosclerotic disease. Mediastinum/Nodes: Dilated esophagus with air-fluid level. Mildly heterogeneous thyroid gland which is decreased in size when compared with prior CT. No pathologically enlarged lymph nodes seen in the chest. Lungs/Pleura: Central airways are patent. Large left pleural effusion with atelectasis. Bilateral solid pulmonary nodules are decreased or no longer present, although portions of the left lung are not visualized due to large pleural effusion which somewhat limits evaluation. Reference subpleural nodule of the left upper lobe measuring 5 mm on series 4, image 17, previously measured 9 mm. Upper Abdomen: Cholelithiasis.  No acute abnormality. Musculoskeletal: Soft tissue gas of the right anterior chest wall, compatible with history of recent port removal. No acute or significant osseous findings. IMPRESSION: 1. Large left pleural effusion with associated atelectasis. 2. Bilateral solid pulmonary nodules are decreased in size or no longer  present. 3. Dilated esophagus with air-fluid level, findings can be seen in the setting of esophageal dysmotility. 4. Mildly heterogeneous thyroid gland, decreased in size when compared with prior chest CT. 5. Aortic Atherosclerosis (ICD10-I70.0). Electronically Signed   By: Jill GlassmanM.D.   On: 10/30/2021 16:40   CT SOFT TISSUE NECK W CONTRAST  Result Date: 10/30/2021 CLINICAL DATA:  Vocal cord paralysis, history of breast cancer EXAM: CT NECK WITH CONTRAST TECHNIQUE: Multidetector CT imaging of the neck was performed using the standard protocol following the bolus administration of intravenous contrast. RADIATION DOSE REDUCTION: This exam was performed according to the departmental dose-optimization program which includes automated exposure control, adjustment of the mA and/or kV according to patient size and/or use of iterative reconstruction technique. CONTRAST:  730mOMNIPAQUE IOHEXOL 300 MG/ML  SOLN COMPARISON:  Correlation made with CTA neck from 2021 FINDINGS: Pharynx and larynx: No mass or swelling. Appearance suggestive of left vocal cord weakness or paralysis. Salivary glands: Parotid and submandibular glands are unremarkable. Thyroid: Unremarkable. Lymph nodes: A mildly enlarged left supraclavicular node was not present on the prior study. This measures 1.2 cm on series 3, image 74. Vascular: Major neck vessels are patent. Partially retropharyngeal course of the ICAs. Limited intracranial: No abnormal enhancement. 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 Visualized orbits: Unremarkable. Mastoids and visualized paranasal sinuses: No significant opacification. Skeleton: No acute osseous abnormality. Upper chest: Postprocedural changes of same day chest port removal. Refer to concurrent dedicated chest CT. Other: None. IMPRESSION: Possible left vocal cord weakness or paralysis. Correlate with clinical exam. Indeterminate mildly enlarged left supraclavicular lymph node. Electronically Signed   By: Jill Rodriguez.D.   On: 10/30/2021 16:33   IR REMOVAL TUN ACCESS W/ PORT W/O FL MOD SED  Result Date: 10/30/2021 INDICATION: Port removal, infection EXAM: Port removal MEDICATIONS: Per EMR ANESTHESIA/SEDATION: Moderate (  conscious) sedation was employed during this procedure. A total of Versed 1 mg and Fentanyl 50 mcg was administered intravenously. Moderate Sedation Time: 12 minutes. The patient's level of consciousness and vital signs were monitored continuously by radiology nursing throughout the procedure under my direct supervision. FLUOROSCOPY TIME:  N/a COMPLICATIONS: None immediate. PROCEDURE: Informed written consent was obtained from the patient after a thorough discussion of the procedural risks, benefits and alternatives. All questions were addressed. Maximal Sterile Barrier Technique was utilized including caps, mask, sterile gowns, sterile gloves, sterile drape, hand hygiene and skin antiseptic. A timeout was performed prior to the initiation of the procedure. The right chest was prepped and draped in the standard sterile fashion. Lidocaine was utilized for local anesthesia. An incision was made over the previously healed surgical incision. Utilizing blunt dissection, the port catheter and reservoir were removed from the underlying subcutaneous tissue in their entirety. After hemostasis, the subcutaneous pockets was closed in two layers using 3-0 and 4-0 Vicryl. Dermabond was also applied. The patient tolerated the procedure well without immediate complication. IMPRESSION: Successful removal of right-sided chest port. The catheter tip and port were sent for microbiology culture. Electronically Signed   By: Jill Rodriguez M.D.   On: 10/30/2021 16:14   MR BRAIN WO CONTRAST  Result Date: 10/29/2021 CLINICAL DATA:  Headache EXAM: MRI HEAD WITHOUT CONTRAST TECHNIQUE: Multiplanar, multiecho pulse sequences of the brain and surrounding structures were obtained without intravenous contrast. COMPARISON:  None  Available. FINDINGS: Brain: No acute infarct, mass effect or extra-axial collection. No acute or chronic hemorrhage. There is multifocal hyperintense T2-weighted signal within the white matter. Parenchymal volume and CSF spaces are normal. The midline structures are normal. Vascular: Major flow voids are preserved. Skull and upper cervical spine: Normal calvarium and skull base. Visualized upper cervical spine and soft tissues are normal. Sinuses/Orbits:No paranasal sinus fluid levels or advanced mucosal thickening. No mastoid or middle ear effusion. Normal orbits. IMPRESSION: 1. No acute intracranial abnormality. 2. Multifocal hyperintense T2-weighted signal within the white matter, most commonly seen in the setting of chronic small vessel ischemia. Electronically Signed   By: Ulyses Jarred M.D.   On: 10/29/2021 22:17   ECHOCARDIOGRAM COMPLETE  Result Date: 10/29/2021    ECHOCARDIOGRAM REPORT   Patient Name:   Jill Rodriguez Date of Exam: 10/29/2021 Medical Rec #:  [id number]           Height:       64.0 in Accession #:    [id number]          Weight:       160.9 lb Date of Birth:  1960-05-03           BSA:          1.784 m Patient Age:    60 years            BP:           151/76 mmHg Patient Gender: F                   HR:           90 bpm. Exam Location:  [facility] Procedure: 2D Echo, Color Doppler and Cardiac Doppler Indications:     R00.8 Other abnormalities of the heart  History:         Patient has no prior history of Echocardiogram examinations.                  Signs/Symptoms:Candidemia.  Sonographer:  Charmayne Sheer Referring Phys:  [id number] CALDWELL POWELL JR Diagnosing Phys: Kate Sable MD  Sonographer Comments: Suboptimal parasternal window and suboptimal subcostal window. IMPRESSIONS  1. Left ventricular ejection fraction, by estimation, is 50 to 55%. The left ventricle has low normal function. The left ventricle has no regional wall motion abnormalities. There is mild left ventricular hypertrophy.  Left ventricular diastolic parameters are consistent with Grade I diastolic dysfunction (impaired relaxation).  2. Right ventricular systolic function is normal. The right ventricular size is normal.  3. The mitral valve is normal in structure. No evidence of mitral valve regurgitation.  4. The aortic valve was not well visualized. Aortic valve regurgitation is not visualized.  5. The inferior vena cava is normal in size with greater than 50% respiratory variability, suggesting right atrial pressure of 3 mmHg. FINDINGS  Left Ventricle: Left ventricular ejection fraction, by estimation, is 50 to 55%. The left ventricle has low normal function. The left ventricle has no regional wall motion abnormalities. The left ventricular internal cavity size was normal in size. There is mild left ventricular hypertrophy. Left ventricular diastolic parameters are consistent with Grade I diastolic dysfunction (impaired relaxation). Right Ventricle: The right ventricular size is normal. No increase in right ventricular wall thickness. Right ventricular systolic function is normal. Left Atrium: Left atrial size was normal in size. Right Atrium: Right atrial size was normal in size. Pericardium: There is no evidence of pericardial effusion. Mitral Valve: The mitral valve is normal in structure. No evidence of mitral valve regurgitation. Tricuspid Valve: The tricuspid valve is not well visualized. Tricuspid valve regurgitation is not demonstrated. Aortic Valve: The aortic valve was not well visualized. Aortic valve regurgitation is not visualized. Aortic valve mean gradient measures 3.0 mmHg. Aortic valve peak gradient measures 6.6 mmHg. Aortic valve area, by VTI measures 2.27 cm. Pulmonic Valve: The pulmonic valve was not well visualized. Pulmonic valve regurgitation is not visualized. Aorta: The aortic root and ascending aorta are structurally normal, with no evidence of dilitation. Venous: The inferior vena cava is normal in size  with greater than 50% respiratory variability, suggesting right atrial pressure of 3 mmHg. IAS/Shunts: No atrial level shunt detected by color flow Doppler.  LEFT VENTRICLE PLAX 2D LVIDd:         3.00 cm   Diastology LVIDs:         2.51 cm   LV e' medial:    4.68 cm/s LV PW:         1.12 cm   LV E/e' medial:  15.0 LV IVS:        0.73 cm   LV e' lateral:   5.22 cm/s LVOT diam:     1.90 cm   LV E/e' lateral: 13.4 LV SV:         48 LV SV Index:   27 LVOT Area:     2.84 cm  RIGHT VENTRICLE RV Basal diam:  2.46 cm LEFT ATRIUM             Index        RIGHT ATRIUM          Index LA diam:        3.20 cm 1.79 cm/m   RA Area:     8.72 cm LA Vol (A2C):   24.0 ml 13.45 ml/m  RA Volume:   16.10 ml 9.03 ml/m LA Vol (A4C):   59.9 ml 33.58 ml/m LA Biplane Vol: 41.9 ml 23.49 ml/m  AORTIC VALVE  PULMONIC VALVE AV Area (Vmax):    2.26 cm     PV Vmax:       0.99 m/s AV Area (Vmean):   2.33 cm     PV Peak grad:  3.9 mmHg AV Area (VTI):     2.27 cm AV Vmax:           128.00 cm/s AV Vmean:          83.700 cm/s AV VTI:            0.212 m AV Peak Grad:      6.6 mmHg AV Mean Grad:      3.0 mmHg LVOT Vmax:         102.00 cm/s LVOT Vmean:        68.700 cm/s LVOT VTI:          0.170 m LVOT/AV VTI ratio: 0.80  AORTA Ao Root diam: 3.40 cm MITRAL VALVE MV Area (PHT): 10.18 cm    SHUNTS MV Decel Time: 75 msec      Systemic VTI:  0.17 m MV E velocity: 70.05 cm/s   Systemic Diam: 1.90 cm MV A velocity: 117.50 cm/s MV E/A ratio:  0.60 Kate Sable MD Electronically signed by Kate Sable MD Signature Date/Time: 10/29/2021/3:32:34 PM    Final   .   ASSESSMENT: Left vocal cord paralysis  PLAN: CT of the neck and chest are currently relatively unremarkable with some interval improvement in prior lymph nodes in the left superficial clavicular area.  The case was discussed with oncology yesterday and they noted that a CT scan in May at [facility] had shown the following: "Infiltrative soft tissue involving the bilateral  thyroid lobes, markedly increased since 06/03/2021 (not included in the field of view on 08/23/2021), consistent with worsening metastasis. Recommend dedicated ultrasound for better characterization."  Current imaging does not demonstrate this infiltrative lesion and the thyroid was felt to be relatively unremarkable.  This would seem to indicate a response to treatment. 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  It is possible this could recover with time.  When she follows up at [facility] they could consider evaluation by laryngology to assess residual nerve function with an EMG, and medialization options for the cord, particularly if there is any concern for aspiration on her swallowing studies.  Defer to recommendations of the swallowing therapist once the modified barium swallow is completed.  Will sign off for now, please reconsult as needed. Jill Nearing, MD 10/31/2021 11:11 AM

## 2021-10-31 NOTE — Evaluation (Signed)
Objective Swallowing Evaluation: Type of Study: Bedside Swallow Evaluation   Patient Details  Name: Jill Rodriguez MRN: [id number] Date of Birth: 18-Nov-1960  Today's Date: 10/31/2021 Time: SLP Start Time (ACUTE ONLY): 1045 -SLP Stop Time (ACUTE ONLY): 1200  SLP Time Calculation (min) (ACUTE ONLY): 75 min   Past Medical History:  Past Medical History:  Diagnosis Date   Cancer ([place])    cervical CA, that spread to lymph nodes; last chemo was 12/08/2019; radiation 01/2020   COVID-19 10/2018   Nausea with vomiting 10/31/2019   Neoplasm related pain    No pertinent past medical history    Past Surgical History:  Past Surgical History:  Procedure Laterality Date   ESOPHAGOGASTRODUODENOSCOPY (EGD) WITH PROPOFOL N/A 03/28/2020   Procedure: ESOPHAGOGASTRODUODENOSCOPY (EGD) WITH PROPOFOL;  Surgeon: Milus Banister, MD;  Location: [facility] ENDOSCOPY;  Service: Endoscopy;  Laterality: N/A;   EUS N/A 03/28/2020   Procedure: UPPER ENDOSCOPIC ULTRASOUND (EUS) RADIAL;  Surgeon: Milus Banister, MD;  Location: [facility] ENDOSCOPY;  Service: Endoscopy;  Laterality: N/A;   IR REMOVAL TUN ACCESS W/ PORT W/O FL MOD SED  10/30/2021   no surgical history     HPI: Pt is a 61 y.o. 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  Husband at bedside who provides the history.  Patient has had a chronic cough for couple of months.  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  She has also had recent multiple hospitalization at [facility] due to intractable nausea vomiting as well as E. coli UTI.  Last chemotherapy was on 10/17/2021, she received carboplatin/Taxol/bevacizumab.  Patient has a Mediport.  She  has had intractable nausea vomiting despite utilizing her home antiemetics including Zofran, dexamethasone, olanzapine, compazine, Phenergan suppository, scopolamine patch.  Patient has also had cough which triggers for vomiting.    CT of Chest: Large left pleural effusion with associated atelectasis; Right lung Clear.  2. Bilateral solid pulmonary nodules are decreased in size or no  longer present.  3. Dilated esophagus with air-fluid level, findings can be seen in  the setting of esophageal dysmotility.  4. Mildly heterogeneous thyroid gland, decreased in size when  compared with prior chest CT.    ENT was consulted during this admit for hoarsensss and cough w/ N/V.  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   Subjective: pt awake/alert, verbal. Spanish Interpreter present w/ her during the exam.    Recommendations for follow up therapy are one component of a multi-disciplinary discharge planning process, led by the attending physician.  Recommendations may be updated based on patient status, additional functional criteria and insurance authorization.  Assessment / Plan / Recommendation     10/31/2021    2:00 PM  Clinical Impressions  Clinical Impression Patient presents with grossly functional oropharyngeal swallowing in setting of chronic illness/CA and newly dx'd Left VC paralysis, per ENT. Pt denied difficulty swallowing but endorsed Significant N/V in recent weeks. Noted Chest CT exam w/ Left pleural effusion; Right lung clear.  Oral stage was characterized by adequate lip closure, bolus preparation and containment, and anterior to posterior transit. Bolus Piecemealing was noted to occur w/ all  consistencies, especially liquids of Larger sips. Swallow initiation occurred at the level of the Valleculae. Pharyngeal stage was c/b adequate hyolaryngeal excursion and pharyngeal constriction w/ adequate tongue base  retraction/contact along pharyngeal wall. OF NOTE: Epiglottic was decreased in prominence; action of deflection was adequate and complete. No laryngeal penetration nor aspiration occurred during this study. Any trace-min oral residue following bolus piecemealing was cleared w/ a f/u, Dry swallow -- encouraged pt to use a Dry swallow intermittently during oral intake/swallowing. Pt followed instruction appropriately. Pharyngeal stripping wave appeared complete. Amplitude/duration of cricopharyngeus opening appeared [facility]. There is adequate/complete clearance of boluses through the viewable cervical esophagus.  Consistencies tested were thin liquids x2 tsps, cup sip w/ 3 sequential swallows; nectar x1 tsp, cup sip w/ 2 sequential swallows; pudding x1 tsp, 1 regular solid (1/2 graham cracker with pudding). Pt denied any feeling of difficulty swallowing during study.    Recommend continue a Regular diet with thin liquids; foods of pt's choice/pleasure in setting of N/V; general aspiration precaution and REFLUX precautions. Encouraged moistened foods and alternating solids and liquids w/ a Dry swallow intermittently during meals/po's. No further STservices indicated.  Education on MBSS and recommendations was completed w/ pt during the MBS study and w/ Husband in room post the study. Recommend f/u w/ GI and Oncology re: the N/V; Dietician for nutritional support. Spanish Interpreter present. MD and NSG updated.   SLP Visit Diagnosis Dysphagia, unspecified (R13.10)  Impact on safety and function --         10/31/2021    2:00 PM  Treatment Recommendations  Treatment Recommendations No treatment recommended at this time        10/31/2021    2:00 PM  Prognosis  Prognosis for Safe Diet Advancement Good  Barriers to Reach Goals Time post onset;Severity of deficits  Barriers/Prognosis Comment N/V; dry heaves - Ca related       10/31/2021    2:00 PM  Diet Recommendations  SLP Diet Recommendations Regular  solids;Thin liquid  Liquid Administration via Cup;Straw  Medication Administration Whole meds with puree  Compensations Minimize environmental distractions;Slow rate;Small sips/bites;Follow solids with liquid  Postural Changes Remain semi-upright after after feeds/meals (Comment);Seated upright at 90 degrees         10/31/2021    2:00 PM  Other Recommendations  Recommended Consults Consider GI evaluation  Oral Care Recommendations Oral care BID;Oral care before and after PO;Patient independent with oral care  Other Recommendations --  Follow Up Recommendations No SLP follow up  Assistance recommended at discharge PRN  Functional Status Assessment Patient has not had a recent decline in their functional status       10/31/2021    2:00 PM  Frequency and Duration   Speech Therapy Frequency (ACUTE ONLY) --  Treatment Duration --         10/31/2021    2:00 PM  Oral Phase  Oral Phase [facility]       10/31/2021    2:00 PM  Pharyngeal Phase  Pharyngeal Phase [facility]        10/31/2021    2:00 PM  Cervical Esophageal Phase   Cervical Esophageal Phase [facility]            Orinda Kenner, MS, CCC-SLP Speech Language Pathologist Rehab Services; [place] [phone] (ascom) Tinia Oravec 10/31/2021, 3:08 PM

## 2021-10-31 NOTE — Progress Notes (Addendum)
Progress Note    Jill Rodriguez  TWS:[id number] DOB: Jul 16, 1960  DOA: 10/28/2021 PCP: Pcp, No      Brief Narrative:    Medical records reviewed and are as summarized below:   Jill Rodriguez is a 61 y.o. 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 coli UTI sensitive only to ertapenem and nitrofurantoin, with last chemotherapy infusion on 6/23 who presents to the ED with intractable nausea and vomiting that started 2 days prior.   During her last chemotherapy session she developed acute nausea and vomiting that was treated with Compazine.  Her nausea and vomiting was managed for the first few days with dexamethasone, Marinol, Zofran, Phenergan suppository and scopolamine patch, however 2 days ago she developed intractable vomiting associated with weakness.  According to her husband at the bedside, patient has been having bouts of coughing for the past several weeks and now every time she has a bout of coughing she vomits.  He feels like this happens every 4-6 hours over the past two days.  She has not had shortness of breath, chest pain or fever.  She's been admitted with nausea/vomiting, neutropenia.  She's now been shown to have candidemia.  Currently on broad spectrum antiinfectives.  ID and oncology consulted.  She has markedly abnormal EKG, cardiology c/s pending.   See below for additional details      Assessment/Plan:   Principal Problem:   Chemotherapy induced nausea and vomiting Active Problems:   Neutropenic fever (HCC)   Candidemia (HCC)   Pyuria   HCAP (healthcare-associated pneumonia)   Pancytopenia due to antineoplastic chemotherapy (HCC)   Abnormal EKG   AKI (acute kidney injury)  (HCC)   Abnormal CT of the head   Chemotherapy-induced neutropenia (HCC)   Vocal cord paralysis   Pleural effusion on left   Nutrition Problem: Inadequate oral intake Etiology: poor appetite  Signs/Symptoms: per patient/family report   Body mass index is 27.62 kg/m.   Candidemia with Candida albicans, neutropenic fever: Port-A-Cath tip culture and blood culture showed Candida albicans.  Continue IV fluconazole.  Right upper chest Port-A-Cath was removed by IR on 10/30/2021.  Plan for TEE has been postponed to next week because she had thoracentesis today.  There is also concern for TEE because of esophageal dysmotility with air-fluid levels on CT chest.  Follow-up with ID.  Large left pleural effusion on CT chest: S/p left-sided thoracentesis with removal of 500 mL of yellowish fluid.  Cytology is pending.  Vocal cord paralysis diagnosed by otolaryngologist: CT neck also showed possible left vocal cord weakness or paralysis.  Appreciate input from Dr. Richardson Landry, otolaryngologist.  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  CT chest showed interval improvement in the pulmonary nodules.  Follow-up with oncologist.  Chemotherapy-induced pancytopenia: Continue Tbo-filgrastim.  No indication for platelet or blood transfusion at this time.  Nausea and vomiting: Improving.  Antiemetics as needed.  Hypokalemia and hypomagnesemia: Improved  Hypophosphatemia: Replete with potassium phosphate  Pyuria, multidrug-resistant E. coli in urine: Doubt UTI at this time.  Defer to ID specialist to make decision on antibiotics.  Abnormal head CT: No Chiari I malformation was identified on MRI brain.  Abnormal EKG (T  wave inversions in leads I and aVL, prolonged QTc-460), elevated troponins: Repeat EKG on 10/31/2021 still shows prolonged QTc-494, T wave inversions in leads I and aVL.  No chest pain. Mildly elevated  troponins likely from demand ischemia.    AKI: Improved   Plan of care was discussed with the patient and her husband at the bedside using the iPad Spanish interpretation service   Diet Order             Diet regular Room service appropriate? Yes; Fluid consistency: Thin  Diet effective now                        Consultants: Cardiologist, otolaryngologist, interventional radiologist, oncologist, infectious disease  Procedures: Port-A-Cath removal on 10/30/2021    Medications:    Chlorhexidine Gluconate Cloth  6 each Topical Q0600   feeding supplement  1 Container Oral TID BM   LORazepam  0.5 mg Oral TID   multivitamin with minerals  1 tablet Oral Daily   mouth rinse  15 mL Mouth Rinse 4 times per day   scopolamine  1 patch Transdermal Q72H   Continuous Infusions:  sodium chloride 20 mL/hr at 10/31/21 0900   fluconazole (DIFLUCAN) IV 400 mg (10/31/21 1440)   lactated ringers 1,000 mL with potassium chloride 30 mEq/L IV infusion 75 mL/hr (10/30/21 1856)   potassium PHOSPHATE IVPB (in mmol)     promethazine (PHENERGAN) injection (IM or IVPB) 12.5 mg (10/30/21 1647)     Anti-infectives (From admission, onward)    Start     Dose/Rate Route Frequency Ordered Stop   10/30/21 1600  fluconazole (DIFLUCAN) IVPB 400 mg        400 mg 100 mL/hr over 120 Minutes Intravenous Every 24 hours 10/29/21 1250     10/29/21 2300  vancomycin (VANCOREADY) IVPB 750 mg/150 mL  Status:  Discontinued        750 mg 150 mL/hr over 60 Minutes Intravenous Every 24 hours 10/28/21 2234 10/29/21 1028   10/29/21 1630  fluconazole (DIFLUCAN) IVPB 400 mg       See Hyperspace for full Linked Orders Report.   400 mg 100 mL/hr over 120 Minutes Intravenous  Once 10/29/21 1250 10/29/21 1858   10/29/21 1430  fluconazole (DIFLUCAN) IVPB 400 mg       See Hyperspace for full Linked Orders Report.   400 mg 100 mL/hr over 120 Minutes Intravenous  Once 10/29/21 1250 10/29/21 1607   10/29/21 1315   micafungin (MYCAMINE) 100 mg in sodium chloride 0.9 % 100 mL IVPB  Status:  Discontinued        100 mg 105 mL/hr over 1 Hours Intravenous Every 24 hours 10/29/21 1220 10/29/21 1250   10/29/21 1200  vancomycin (VANCOREADY) IVPB 750 mg/150 mL  Status:  Discontinued        750 mg 150 mL/hr over 60 Minutes Intravenous Every 12 hours 10/29/21 1028 10/29/21 1428   10/28/21 2245  meropenem (MERREM) 1 g in sodium chloride 0.9 % 100 mL IVPB  Status:  Discontinued        1 g 200 mL/hr over 30 Minutes Intravenous Every 8 hours 10/28/21 2231 10/29/21 1953   10/28/21 2245  vancomycin (VANCOREADY) IVPB 1750 mg/350 mL        1,750 mg 175 mL/hr over 120 Minutes Intravenous  Once 10/28/21 2231 10/29/21 0119   10/28/21 2015  ceFEPIme (MAXIPIME) 2 g in sodium chloride 0.9 % 100 mL IVPB  2 g 200 mL/hr over 30 Minutes Intravenous  Once 10/28/21 2009 10/28/21 2151   10/28/21 1845  cefTRIAXone (ROCEPHIN) 1 g in sodium chloride 0.9 % 100 mL IVPB  Status:  Discontinued        1 g 200 mL/hr over 30 Minutes Intravenous  Once 10/28/21 1838 10/28/21 1839              Family Communication/Anticipated D/C date and plan/Code Status   DVT prophylaxis: SCDs Start: 10/28/21 2223     Code Status: Full Code  Family Communication: Plan discussed with her husband at the bedside Disposition Plan: To be determined.  She will probably need SNF at discharge.   Status is: Inpatient Remains inpatient appropriate because: Candidemia on IV fluconazole       Subjective:   Interval events noted.  She complains of nausea and abdominal pain.  No vomiting no abdominal pain.  Her husband was at the bedside  Objective:    Vitals:   10/31/21 0521 10/31/21 0746 10/31/21 1200 10/31/21 1232  BP: (!) 150/85 (!) 160/88 (!) 151/81 (!) 148/63  Pulse: 95 97 (!) 103 (!) 110  Resp: 18 18    Temp: 98.4 F (36.9 C) 98.8 F (37.1 C)    TempSrc: Oral     SpO2: 94% 92% 98% 100%  Weight:      Height:       No  data found.   Intake/Output Summary (Last 24 hours) at 10/31/2021 1448 Last data filed at 10/31/2021 0525 Gross per 24 hour  Intake --  Output 1000 ml  Net -1000 ml   Filed Weights   10/28/21 1529 10/29/21 0159  Weight: 77.1 kg 73 kg    Exam:  GEN: NAD SKIN: Warm and dry EYES: EOMI ENT: MMM CV: RRR PULM: CTA B ABD: soft, obese, NT, +BS CNS: AAO x 3, non focal EXT: No edema or tenderness       Data Reviewed:   I have personally reviewed following labs and imaging studies:  Labs: Labs show the following:   Basic Metabolic Panel: Recent Labs  Lab 10/28/21 1531 10/29/21 0554 10/30/21 0514 10/31/21 0349  NA 134* 137 134* 136  K 3.6 3.4* 3.3* 3.8  CL 99 104 102 102  CO2 '28 28 26 27  '$ GLUCOSE 107* 90 90 77  BUN 23* '18 14 14  '$ CREATININE 1.10* 0.85 0.85 0.94  CALCIUM 8.7* 8.4* 8.4* 8.3*  MG  --   --  1.4* 1.7  PHOS  --   --  2.6 2.3*   GFR Estimated Creatinine Clearance: 62.3 mL/min (by C-G formula based on SCr of 0.94 mg/dL). Liver Function Tests: Recent Labs  Lab 10/28/21 1531 10/30/21 0514  AST 30 24  ALT 42 30  ALKPHOS 68 64  BILITOT 0.8 1.0  PROT 6.4* 6.1*  ALBUMIN 3.3* 2.9*   Recent Labs  Lab 10/28/21 1531  LIPASE 23   No results for input(s): "AMMONIA" in the last 168 hours. Coagulation profile No results for input(s): "INR", "PROTIME" in the last 168 hours.  CBC: Recent Labs  Lab 10/28/21 1531 10/29/21 0554 10/30/21 0514 10/30/21 1758  WBC 1.0*  1.0* 1.0*  1.0* 2.5* 3.8*  NEUTROABS 0.1* 0.2* 1.8 3.0  HGB 8.7*  8.8* 7.8*  7.9* 8.1* 7.9*  HCT 26.1*  26.4* 23.3*  23.6* 24.0* 23.4*  MCV 93.2  92.3 92.8  93.3 91.6 91.1  PLT 61*  59* 50*  47* 43* 39*   Cardiac Enzymes: No  results for input(s): "CKTOTAL", "CKMB", "CKMBINDEX", "TROPONINI" in the last 168 hours. BNP (last 3 results) No results for input(s): "PROBNP" in the last 8760 hours. CBG: No results for input(s): "GLUCAP" in the last 168 hours. D-Dimer: No results  for input(s): "DDIMER" in the last 72 hours. Hgb A1c: No results for input(s): "HGBA1C" in the last 72 hours. Lipid Profile: No results for input(s): "CHOL", "HDL", "LDLCALC", "TRIG", "CHOLHDL", "LDLDIRECT" in the last 72 hours. Thyroid function studies: No results for input(s): "TSH", "T4TOTAL", "T3FREE", "THYROIDAB" in the last 72 hours.  Invalid input(s): "FREET3" Anemia work up: No results for input(s): "VITAMINB12", "FOLATE", "FERRITIN", "TIBC", "IRON", "RETICCTPCT" in the last 72 hours. Sepsis Labs: Recent Labs  Lab 10/28/21 1531 10/28/21 1851 10/28/21 2025 10/29/21 0554 10/30/21 0514 10/30/21 1758  PROCALCITON <0.10  --   --   --   --   --   WBC 1.0*  1.0*  --   --  1.0*  1.0* 2.5* 3.8*  LATICACIDVEN  --  1.3 0.7  --   --   --     Microbiology Recent Results (from the past 240 hour(s))  Urine Culture     Status: Abnormal   Collection Time: 10/28/21  3:30 PM   Specimen: Urine, Clean Catch  Result Value Ref Range Status   Specimen Description   Final    URINE, CLEAN CATCH Performed at [facility], [address], [place], [place] [address]    Special Requests   Final    NONE Performed at [facility], [address] [place], [place] [address]    Culture (A)  Final    >=100,000 COLONIES/mL ESCHERICHIA COLI Confirmed Extended Spectrum Beta-Lactamase Producer (ESBL).  In bloodstream infections from ESBL organisms, carbapenems are preferred over piperacillin/tazobactam. They are shown to have a lower risk of mortality.    Report Status 10/31/2021 FINAL  Final   Organism ID, Bacteria ESCHERICHIA COLI (A)  Final      Susceptibility   Escherichia coli - MIC*    AMPICILLIN >=32 RESISTANT Resistant     CEFAZOLIN >=64 RESISTANT Resistant     CEFEPIME 16 RESISTANT Resistant     CEFTRIAXONE >=64 RESISTANT Resistant     CIPROFLOXACIN 2 RESISTANT Resistant     GENTAMICIN >=16 RESISTANT Resistant     IMIPENEM <=0.25 SENSITIVE Sensitive      NITROFURANTOIN 32 SENSITIVE Sensitive     TRIMETH/SULFA >=320 RESISTANT Resistant     AMPICILLIN/SULBACTAM >=32 RESISTANT Resistant     PIP/TAZO <=4 SENSITIVE Sensitive     * >=100,000 COLONIES/mL ESCHERICHIA COLI  SARS Coronavirus 2 by RT PCR (hospital order, performed in [place] hospital lab) *cepheid single result test* Anterior Nasal Swab     Status: None   Collection Time: 10/28/21  6:51 PM   Specimen: Anterior Nasal Swab  Result Value Ref Range Status   SARS Coronavirus 2 by RT PCR NEGATIVE NEGATIVE Final    Comment: (NOTE) SARS-CoV-2 target nucleic acids are NOT DETECTED.  The SARS-CoV-2 RNA is generally detectable in upper and lower respiratory specimens during the acute phase of infection. The lowest concentration of SARS-CoV-2 viral copies this assay can detect is 250 copies / mL. A negative result does not preclude SARS-CoV-2 infection and should not be used as the sole basis for treatment or other patient management decisions.  A negative result may occur with improper specimen collection / handling, submission of specimen other than nasopharyngeal swab, presence of viral mutation(s) within the areas targeted  by this assay, and inadequate number of viral copies (<250 copies / mL). A negative result must be combined with clinical observations, patient history, and epidemiological information.  Fact Sheet for Patients:   https://www.patel.info/  Fact Sheet for Healthcare Providers: https://hall.com/  This test is not yet approved or  cleared by the [place] FDA and has been authorized for detection and/or diagnosis of SARS-CoV-2 by FDA under an Emergency Use Authorization (EUA).  This EUA will remain in effect (meaning this test can be used) for the duration of the COVID-19 declaration under Section 564(b)(1) of the Act, 21 U.S.C. section 360bbb-3(b)(1), unless the authorization is terminated or revoked  sooner.  Performed at [facility], [address] [place], [place] [address]   Blood culture (routine x 2)     Status: Abnormal (Preliminary result)   Collection Time: 10/28/21  8:25 PM   Specimen: BLOOD  Result Value Ref Range Status   Specimen Description   Final    BLOOD LEFT ASSIST CONTROL Performed at [facility], [address] [place], [place] [address]    Special Requests   Final    BOTTLES DRAWN AEROBIC AND ANAEROBIC Blood Culture adequate volume Performed at [facility], [address] [place], [place] [address]    Culture  Setup Time   Final    YEAST IN BOTH AEROBIC AND ANAEROBIC BOTTLES CRITICAL VALUE NOTED.  VALUE IS CONSISTENT WITH PREVIOUSLY REPORTED AND CALLED VALUE. Performed at [facility], [address] [place], [place] [address]    Culture CANDIDA ALBICANS (A)  Final   Report Status PENDING  Incomplete  Blood culture (routine x 2)     Status: Abnormal (Preliminary result)   Collection Time: 10/28/21  8:25 PM   Specimen: BLOOD  Result Value Ref Range Status   Specimen Description   Final    BLOOD PORTA CATH Performed at [facility], [address], [place], [place] [address]    Special Requests   Final    BOTTLES DRAWN AEROBIC AND ANAEROBIC Blood Culture adequate volume Performed at [facility], [address] [place], [place] [address]    Culture  Setup Time   Final    Organism ID to follow IN BOTH AEROBIC AND ANAEROBIC BOTTLES YEAST CRITICAL RESULT CALLED TO, READ BACK BY AND VERIFIED WITH: Carey Bullocks 10/29/2021 at 1137 SRR Performed at [facility], [address] [place], [place] [address]    Culture (A)  Final    CANDIDA ALBICANS Sent to [facility] for further susceptibility testing. Performed at [facility], [place] [address], [place], [place] [address]    Report Status PENDING  Incomplete  Blood Culture ID Panel (Reflexed)     Status: Abnormal   Collection  Time: 10/28/21  8:25 PM  Result Value Ref Range Status   Enterococcus faecalis NOT DETECTED NOT DETECTED Final   Enterococcus Faecium NOT DETECTED NOT DETECTED Final   Listeria monocytogenes NOT DETECTED NOT DETECTED Final   Staphylococcus species NOT DETECTED NOT DETECTED Final   Staphylococcus aureus (BCID) NOT DETECTED NOT DETECTED Final   Staphylococcus epidermidis NOT DETECTED NOT DETECTED Final   Staphylococcus lugdunensis NOT DETECTED NOT DETECTED Final   Streptococcus species NOT DETECTED NOT DETECTED Final   Streptococcus agalactiae NOT DETECTED NOT DETECTED Final   Streptococcus pneumoniae NOT DETECTED NOT DETECTED Final   Streptococcus pyogenes NOT DETECTED NOT DETECTED Final   A.calcoaceticus-baumannii NOT DETECTED NOT DETECTED Final   Bacteroides fragilis NOT  DETECTED NOT DETECTED Final   Enterobacterales NOT DETECTED NOT DETECTED Final   Enterobacter cloacae complex NOT DETECTED NOT DETECTED Final   Escherichia coli NOT DETECTED NOT DETECTED Final   Klebsiella aerogenes NOT DETECTED NOT DETECTED Final   Klebsiella oxytoca NOT DETECTED NOT DETECTED Final   Klebsiella pneumoniae NOT DETECTED NOT DETECTED Final   Proteus species NOT DETECTED NOT DETECTED Final   Salmonella species NOT DETECTED NOT DETECTED Final   Serratia marcescens NOT DETECTED NOT DETECTED Final   Haemophilus influenzae NOT DETECTED NOT DETECTED Final   Neisseria meningitidis NOT DETECTED NOT DETECTED Final   Pseudomonas aeruginosa NOT DETECTED NOT DETECTED Final   Stenotrophomonas maltophilia NOT DETECTED NOT DETECTED Final   Candida albicans DETECTED (A) NOT DETECTED Final    Comment: CRITICAL RESULT CALLED TO, READ BACK BY AND VERIFIED WITH: Carey Bullocks 10/29/2021 at 1137 SRR    Candida auris NOT DETECTED NOT DETECTED Final   Candida glabrata NOT DETECTED NOT DETECTED Final   Candida krusei NOT DETECTED NOT DETECTED Final   Candida parapsilosis NOT DETECTED NOT DETECTED Final   Candida tropicalis  NOT DETECTED NOT DETECTED Final   Cryptococcus neoformans/gattii NOT DETECTED NOT DETECTED Final    Comment: Performed at [facility], [address] [place], [place] [address]  MRSA Next Gen by PCR, Nasal     Status: None   Collection Time: 10/29/21 12:11 PM   Specimen: Nasal Mucosa; Nasal Swab  Result Value Ref Range Status   MRSA by PCR Next Gen NOT DETECTED NOT DETECTED Final    Comment: (NOTE) The GeneXpert MRSA Assay (FDA approved for NASAL specimens only), is one component of a comprehensive MRSA colonization surveillance program. It is not intended to diagnose MRSA infection nor to guide or monitor treatment for MRSA infections. Test performance is not FDA approved in patients less than 28 years old. Performed at [facility], [address] [place], [place] [address]   Culture, blood (Routine X 2) w Reflex to ID Panel     Status: None (Preliminary result)   Collection Time: 10/29/21  2:41 PM   Specimen: BLOOD  Result Value Ref Range Status   Specimen Description BLOOD RIGHT ANTECUBITAL  Final   Special Requests   Final    BOTTLES DRAWN AEROBIC AND ANAEROBIC Blood Culture adequate volume   Culture   Final    NO GROWTH 2 DAYS Performed at [facility], [address], [place], [place] [address]    Report Status PENDING  Incomplete  Culture, blood (Routine X 2) w Reflex to ID Panel     Status: None (Preliminary result)   Collection Time: 10/29/21  3:00 PM   Specimen: BLOOD  Result Value Ref Range Status   Specimen Description BLOOD LEFT ANTECUBITAL  Final   Special Requests   Final    BOTTLES DRAWN AEROBIC AND ANAEROBIC Blood Culture adequate volume   Culture   Final    NO GROWTH 2 DAYS Performed at [facility], [address], [place], [place] [address]    Report Status PENDING  Incomplete  Culture, blood (Routine X 2) w Reflex to ID Panel     Status: None (Preliminary result)   Collection Time: 10/30/21  5:14 AM    Specimen: BLOOD  Result Value Ref Range Status   Specimen Description BLOOD LEFT AC  Final   Special Requests   Final    BOTTLES DRAWN AEROBIC AND ANAEROBIC Blood Culture adequate volume   Culture  Final    NO GROWTH 1 DAY Performed at [facility], [address] [place], [place] [address]    Report Status PENDING  Incomplete  Culture, blood (Routine X 2) w Reflex to ID Panel     Status: None (Preliminary result)   Collection Time: 10/30/21  5:15 AM   Specimen: BLOOD  Result Value Ref Range Status   Specimen Description BLOOD LEFT HAND  Final   Special Requests   Final    BOTTLES DRAWN AEROBIC AND ANAEROBIC Blood Culture adequate volume   Culture   Final    NO GROWTH 1 DAY Performed at [facility], [address], [place], [place] [address]    Report Status PENDING  Incomplete  Cath Tip Culture     Status: None (Preliminary result)   Collection Time: 10/30/21  3:51 PM   Specimen: Catheter Tip  Result Value Ref Range Status   Specimen Description CATH TIP  Final   Special Requests CHEST PORT CATH TIP  Final   Culture   Final    NO GROWTH < 12 HOURS Performed at [facility], [place] [address] [place], [place] [address]    Report Status PENDING  Incomplete    Procedures and diagnostic studies:  US THORACENTESIS ASP PLEURAL SPACE W/IMG GUIDE  Result Date: 10/31/2021 INDICATION: Left pleural effusion EXAM: ULTRASOUND GUIDED LEFT THORACENTESIS MEDICATIONS: 8 cc 1% lidocaine COMPLICATIONS: None immediate. PROCEDURE: An ultrasound guided thoracentesis was thoroughly discussed with the patient and questions answered. The benefits, risks, alternatives and complications were also discussed. The patient understands and wishes to proceed with the procedure. Written consent was obtained. Ultrasound was performed to localize and mark an adequate pocket of fluid in the left chest. The area was then prepped and draped in the normal sterile fashion. 1% Lidocaine was  used for local anesthesia. Under ultrasound guidance a 6 Fr Safe-T-Centesis catheter was introduced. Thoracentesis was performed. The catheter was removed and a dressing applied. FINDINGS: A total of approximately 500 mL of yellow fluid was removed. Samples were sent to the laboratory as requested by the clinical team IMPRESSION: Successful ultrasound guided left thoracentesis yielding 500 mL of pleural fluid. Follow-up chest x-ray revealed no pneumothorax. Read by: Reatha Armour, PA-C Electronically Signed   By: Corrie Mckusick D.O.   On: 10/31/2021 13:10   DG Chest Port 1 View  Result Date: 10/31/2021 CLINICAL DATA:  Status post thoracentesis. EXAM: PORTABLE CHEST 1 VIEW COMPARISON:  10/28/2021. FINDINGS: Large left pleural effusion with overlying left basilar opacities. Right lung is clear. Prominent right-sided nipple shadow. No visible pneumothorax. Cardiomediastinal silhouette is partially obscured, but enlarged. Contrast and partially imaged stomach and bowel. IMPRESSION: Large left pleural effusion with overlying left basilar opacities. No visible pneumothorax. Electronically Signed   By: Margaretha Sheffield M.D.   On: 10/31/2021 12:51   CT CHEST W CONTRAST  Result Date: 10/30/2021 CLINICAL DATA:  Vocal cord paralysis, history of breast cancer with pulmonary metastasis; * Tracking Code: BO * EXAM: CT CHEST WITH CONTRAST TECHNIQUE: Multidetector CT imaging of the chest was performed during intravenous contrast administration. RADIATION DOSE REDUCTION: This exam was performed according to the departmental dose-optimization program which includes automated exposure control, adjustment of the mA and/or kV according to patient size and/or use of iterative reconstruction technique. CONTRAST:  54m OMNIPAQUE IOHEXOL 300 MG/ML  SOLN COMPARISON:  Chest CT dated August 10, 2021 FINDINGS: Cardiovascular: Normal heart size. No pericardial effusion. No coronary artery calcifications. Normal caliber thoracic  aorta with  mild atherosclerotic disease. Mediastinum/Nodes: Dilated esophagus with air-fluid level. Mildly heterogeneous thyroid gland which is decreased in size when compared with prior CT. No pathologically enlarged lymph nodes seen in the chest. Lungs/Pleura: Central airways are patent. Large left pleural effusion with atelectasis. Bilateral solid pulmonary nodules are decreased or no longer present, although portions of the left lung are not visualized due to large pleural effusion which somewhat limits evaluation. Reference subpleural nodule of the left upper lobe measuring 5 mm on series 4, image 17, previously measured 9 mm. Upper Abdomen: Cholelithiasis.  No acute abnormality. Musculoskeletal: Soft tissue gas of the right anterior chest wall, compatible with history of recent port removal. No acute or significant osseous findings. IMPRESSION: 1. Large left pleural effusion with associated atelectasis. 2. Bilateral solid pulmonary nodules are decreased in size or no longer present. 3. Dilated esophagus with air-fluid level, findings can be seen in the setting of esophageal dysmotility. 4. Mildly heterogeneous thyroid gland, decreased in size when compared with prior chest CT. 5. Aortic Atherosclerosis (ICD10-I70.0). Electronically Signed   By: Yetta Glassman M.D.   On: 10/30/2021 16:40   CT SOFT TISSUE NECK W CONTRAST  Result Date: 10/30/2021 CLINICAL DATA:  Vocal cord paralysis, history of breast cancer EXAM: CT NECK WITH CONTRAST TECHNIQUE: Multidetector CT imaging of the neck was performed using the standard protocol following the bolus administration of intravenous contrast. RADIATION DOSE REDUCTION: This exam was performed according to the departmental dose-optimization program which includes automated exposure control, adjustment of the mA and/or kV according to patient size and/or use of iterative reconstruction technique. CONTRAST:  18m OMNIPAQUE IOHEXOL 300 MG/ML  SOLN COMPARISON:  Correlation made with  CTA neck from 2021 FINDINGS: Pharynx and larynx: No mass or swelling. Appearance suggestive of left vocal cord weakness or paralysis. Salivary glands: Parotid and submandibular glands are unremarkable. Thyroid: Unremarkable. Lymph nodes: A mildly enlarged left supraclavicular node was not present on the prior study. This measures 1.2 cm on series 3, image 74. Vascular: Major neck vessels are patent. Partially retropharyngeal course of the ICAs. Limited intracranial: No abnormal enhancement. 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 Visualized orbits: Unremarkable. Mastoids and visualized paranasal sinuses: No significant opacification. Skeleton: No acute osseous abnormality. Upper chest: Postprocedural changes of same day chest port removal. Refer to concurrent dedicated chest CT. Other: None. IMPRESSION: Possible left vocal cord weakness or paralysis. Correlate with clinical exam. Indeterminate mildly enlarged left supraclavicular lymph node. Electronically Signed   By: PMacy MisM.D.   On: 10/30/2021 16:33   IR REMOVAL TUN ACCESS W/ PORT W/O FL MOD SED  Result Date: 10/30/2021 INDICATION: Port removal, infection EXAM: Port removal MEDICATIONS: Per EMR ANESTHESIA/SEDATION: Moderate (conscious) sedation was employed during this procedure. A total of Versed 1 mg and Fentanyl 50 mcg was administered intravenously. Moderate Sedation Time: 12 minutes. The patient's level of consciousness and vital signs were monitored continuously by radiology nursing throughout the procedure under my direct supervision. FLUOROSCOPY TIME:  N/a COMPLICATIONS: None immediate. PROCEDURE: Informed written consent was obtained from the patient after a thorough discussion of the procedural risks, benefits and alternatives. All questions were addressed. Maximal Sterile Barrier Technique was utilized including caps, mask, sterile gowns, sterile gloves, sterile drape, hand hygiene and skin antiseptic. A timeout was performed prior to the  initiation of the procedure. The right chest was prepped and draped in the standard sterile fashion. Lidocaine was utilized for local anesthesia. An incision was made over the previously healed surgical incision. Utilizing blunt  dissection, the port catheter and reservoir were removed from the underlying subcutaneous tissue in their entirety. After hemostasis, the subcutaneous pockets was closed in two layers using 3-0 and 4-0 Vicryl. Dermabond was also applied. The patient tolerated the procedure well without immediate complication. IMPRESSION: Successful removal of right-sided chest port. The catheter tip and port were sent for microbiology culture. Electronically Signed   By: Albin Felling M.D.   On: 10/30/2021 16:14   MR BRAIN WO CONTRAST  Result Date: 10/29/2021 CLINICAL DATA:  Headache EXAM: MRI HEAD WITHOUT CONTRAST TECHNIQUE: Multiplanar, multiecho pulse sequences of the brain and surrounding structures were obtained without intravenous contrast. COMPARISON:  None Available. FINDINGS: Brain: No acute infarct, mass effect or extra-axial collection. No acute or chronic hemorrhage. There is multifocal hyperintense T2-weighted signal within the white matter. Parenchymal volume and CSF spaces are normal. The midline structures are normal. Vascular: Major flow voids are preserved. Skull and upper cervical spine: Normal calvarium and skull base. Visualized upper cervical spine and soft tissues are normal. Sinuses/Orbits:No paranasal sinus fluid levels or advanced mucosal thickening. No mastoid or middle ear effusion. Normal orbits. IMPRESSION: 1. No acute intracranial abnormality. 2. Multifocal hyperintense T2-weighted signal within the white matter, most commonly seen in the setting of chronic small vessel ischemia. Electronically Signed   By: Ulyses Jarred M.D.   On: 10/29/2021 22:17   ECHOCARDIOGRAM COMPLETE  Result Date: 10/29/2021    ECHOCARDIOGRAM REPORT   Patient Name:   TEQUITA MARRS Date of  Exam: 10/29/2021 Medical Rec #:  [id number]           Height:       64.0 in Accession #:    [id number]          Weight:       160.9 lb Date of Birth:  20-Dec-1960           BSA:          1.784 m Patient Age:    63 years            BP:           151/76 mmHg Patient Gender: F                   HR:           90 bpm. Exam Location:  [facility] Procedure: 2D Echo, Color Doppler and Cardiac Doppler Indications:     R00.8 Other abnormalities of the heart  History:         Patient has no prior history of Echocardiogram examinations.                  Signs/Symptoms:Candidemia.  Sonographer:     Charmayne Sheer Referring Phys:  [id number] CALDWELL POWELL JR Diagnosing Phys: Kate Sable MD  Sonographer Comments: Suboptimal parasternal window and suboptimal subcostal window. IMPRESSIONS  1. Left ventricular ejection fraction, by estimation, is 50 to 55%. The left ventricle has low normal function. The left ventricle has no regional wall motion abnormalities. There is mild left ventricular hypertrophy. Left ventricular diastolic parameters are consistent with Grade I diastolic dysfunction (impaired relaxation).  2. Right ventricular systolic function is normal. The right ventricular size is normal.  3. The mitral valve is normal in structure. No evidence of mitral valve regurgitation.  4. The aortic valve was not well visualized. Aortic valve regurgitation is not visualized.  5. The inferior vena cava is normal in size with greater than 50% respiratory variability, suggesting right  atrial pressure of 3 mmHg. FINDINGS  Left Ventricle: Left ventricular ejection fraction, by estimation, is 50 to 55%. The left ventricle has low normal function. The left ventricle has no regional wall motion abnormalities. The left ventricular internal cavity size was normal in size. There is mild left ventricular hypertrophy. Left ventricular diastolic parameters are consistent with Grade I diastolic dysfunction (impaired relaxation). Right Ventricle: The  right ventricular size is normal. No increase in right ventricular wall thickness. Right ventricular systolic function is normal. Left Atrium: Left atrial size was normal in size. Right Atrium: Right atrial size was normal in size. Pericardium: There is no evidence of pericardial effusion. Mitral Valve: The mitral valve is normal in structure. No evidence of mitral valve regurgitation. Tricuspid Valve: The tricuspid valve is not well visualized. Tricuspid valve regurgitation is not demonstrated. Aortic Valve: The aortic valve was not well visualized. Aortic valve regurgitation is not visualized. Aortic valve mean gradient measures 3.0 mmHg. Aortic valve peak gradient measures 6.6 mmHg. Aortic valve area, by VTI measures 2.27 cm. Pulmonic Valve: The pulmonic valve was not well visualized. Pulmonic valve regurgitation is not visualized. Aorta: The aortic root and ascending aorta are structurally normal, with no evidence of dilitation. Venous: The inferior vena cava is normal in size with greater than 50% respiratory variability, suggesting right atrial pressure of 3 mmHg. IAS/Shunts: No atrial level shunt detected by color flow Doppler.  LEFT VENTRICLE PLAX 2D LVIDd:         3.00 cm   Diastology LVIDs:         2.51 cm   LV e' medial:    4.68 cm/s LV PW:         1.12 cm   LV E/e' medial:  15.0 LV IVS:        0.73 cm   LV e' lateral:   5.22 cm/s LVOT diam:     1.90 cm   LV E/e' lateral: 13.4 LV SV:         48 LV SV Index:   27 LVOT Area:     2.84 cm  RIGHT VENTRICLE RV Basal diam:  2.46 cm LEFT ATRIUM             Index        RIGHT ATRIUM          Index LA diam:        3.20 cm 1.79 cm/m   RA Area:     8.72 cm LA Vol (A2C):   24.0 ml 13.45 ml/m  RA Volume:   16.10 ml 9.03 ml/m LA Vol (A4C):   59.9 ml 33.58 ml/m LA Biplane Vol: 41.9 ml 23.49 ml/m  AORTIC VALVE                    PULMONIC VALVE AV Area (Vmax):    2.26 cm     PV Vmax:       0.99 m/s AV Area (Vmean):   2.33 cm     PV Peak grad:  3.9 mmHg AV Area  (VTI):     2.27 cm AV Vmax:           128.00 cm/s AV Vmean:          83.700 cm/s AV VTI:            0.212 m AV Peak Grad:      6.6 mmHg AV Mean Grad:      3.0 mmHg LVOT Vmax:  102.00 cm/s LVOT Vmean:        68.700 cm/s LVOT VTI:          0.170 m LVOT/AV VTI ratio: 0.80  AORTA Ao Root diam: 3.40 cm MITRAL VALVE MV Area (PHT): 10.18 cm    SHUNTS MV Decel Time: 75 msec      Systemic VTI:  0.17 m MV E velocity: 70.05 cm/s   Systemic Diam: 1.90 cm MV A velocity: 117.50 cm/s MV E/A ratio:  0.60 Kate Sable MD Electronically signed by Kate Sable MD Signature Date/Time: 10/29/2021/3:32:34 PM    Final                LOS: 3 days   Dodi Leu  [facility]   Pager on [email or web address]. If 7PM-7AM, please contact night-coverage at [email or web address]     10/31/2021, 2:48 PM

## 2021-10-31 NOTE — Progress Notes (Signed)
Spanish interpreter used via video chat tablet. Maser #[id number] was used to interpret for assessment, medications and plan of care for the day. Patient requested pain medication at this time for abdominal pain 7/10. IV morphine was given at her request. All questions were answered and patient and spouse were encouraged to utilize interpreter for any further questions or needs.

## 2021-11-01 DIAGNOSIS — R930 Abnormal findings on diagnostic imaging of skull and head, not elsewhere classified: Secondary | ICD-10-CM | POA: Diagnosis not present

## 2021-11-01 DIAGNOSIS — B377 Candidal sepsis: Secondary | ICD-10-CM | POA: Diagnosis not present

## 2021-11-01 DIAGNOSIS — R112 Nausea with vomiting, unspecified: Secondary | ICD-10-CM | POA: Diagnosis not present

## 2021-11-01 DIAGNOSIS — R9431 Abnormal electrocardiogram [ECG] [EKG]: Secondary | ICD-10-CM | POA: Diagnosis not present

## 2021-11-01 LAB — CBC WITH DIFFERENTIAL/PLATELET
Abs Immature Granulocytes: 0.09 10*3/uL — ABNORMAL HIGH (ref 0.00–0.07)
Basophils Absolute: 0.1 10*3/uL (ref 0.0–0.1)
Basophils Relative: 1 %
Eosinophils Absolute: 0 10*3/uL (ref 0.0–0.5)
Eosinophils Relative: 0 %
HCT: 24 % — ABNORMAL LOW (ref 36.0–46.0)
Hemoglobin: 7.9 g/dL — ABNORMAL LOW (ref 12.0–15.0)
Immature Granulocytes: 2 %
Lymphocytes Relative: 18 %
Lymphs Abs: 0.9 10*3/uL (ref 0.7–4.0)
MCH: 30.6 pg (ref 26.0–34.0)
MCHC: 32.9 g/dL (ref 30.0–36.0)
MCV: 93 fL (ref 80.0–100.0)
Monocytes Absolute: 0.3 10*3/uL (ref 0.1–1.0)
Monocytes Relative: 6 %
Neutro Abs: 3.4 10*3/uL (ref 1.7–7.7)
Neutrophils Relative %: 73 %
Platelets: 37 10*3/uL — ABNORMAL LOW (ref 150–400)
RBC: 2.58 MIL/uL — ABNORMAL LOW (ref 3.87–5.11)
RDW: 15.2 % (ref 11.5–15.5)
WBC: 4.7 10*3/uL (ref 4.0–10.5)
nRBC: 0 % (ref 0.0–0.2)

## 2021-11-01 LAB — BASIC METABOLIC PANEL
Anion gap: 6 (ref 5–15)
BUN: 15 mg/dL (ref 6–20)
CO2: 28 mmol/L (ref 22–32)
Calcium: 8.3 mg/dL — ABNORMAL LOW (ref 8.9–10.3)
Chloride: 101 mmol/L (ref 98–111)
Creatinine, Ser: 0.9 mg/dL (ref 0.44–1.00)
GFR, Estimated: >60 mL/min (ref >60)
Glucose, Bld: 86 mg/dL (ref 70–99)
Potassium: 3.6 mmol/L (ref 3.5–5.1)
Sodium: 135 mmol/L (ref 135–145)

## 2021-11-01 LAB — PROTEIN, BODY FLUID (OTHER): Total Protein, Body Fluid Other: 3 g/dL

## 2021-11-01 LAB — PHOSPHORUS: Phosphorus: 3.7 mg/dL (ref 2.5–4.6)

## 2021-11-01 LAB — MAGNESIUM: Magnesium: 2.1 mg/dL (ref 1.7–2.4)

## 2021-11-01 NOTE — Progress Notes (Signed)
Progress Note    Jill Rodriguez  URK:[id number] DOB: 1960/07/23  DOA: 10/28/2021 PCP: Pcp, No      Brief Narrative:    Medical records reviewed and are as summarized below:   Jill Rodriguez is a 61 y.o. 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 coli UTI sensitive only to ertapenem and nitrofurantoin, with last chemotherapy infusion on 6/23 who presents to the ED with intractable nausea and vomiting that started 2 days prior.   During her last chemotherapy session she developed acute nausea and vomiting that was treated with Compazine.  Her nausea and vomiting was managed for the first few days with dexamethasone, Marinol, Zofran, Phenergan suppository and scopolamine patch, however 2 days ago she developed intractable vomiting associated with weakness.  According to her husband at the bedside, patient has been having bouts of coughing for the past several weeks and now every time she has a bout of coughing she vomits.  He feels like this happens every 4-6 hours over the past two days.  She has not had shortness of breath, chest pain or fever.  She's been admitted with nausea/vomiting, neutropenia.  She's now been shown to have candidemia.  Currently on broad spectrum antiinfectives.  ID and oncology consulted.  She has markedly abnormal EKG, cardiology c/s pending.   See below for additional details      Assessment/Plan:   Principal Problem:   Chemotherapy induced nausea and vomiting Active Problems:   Neutropenic fever (HCC)   Candidemia (HCC)   Pyuria   HCAP (healthcare-associated pneumonia)   Pancytopenia due to antineoplastic chemotherapy (HCC)   Abnormal EKG   AKI (acute kidney injury)  (HCC)   Abnormal CT of the head   Chemotherapy induced neutropenia (HCC)   Vocal cord paralysis   Pleural effusion on left   S/P thoracentesis   Nutrition Problem: Inadequate oral intake Etiology: poor appetite  Signs/Symptoms: per patient/family report   Body mass index is 27.62 kg/m.   Candidemia with Candida albicans, neutropenic fever: Port-A-Cath tip culture and blood culture showed Candida albicans.  Continue IV fluconazole.  Right upper chest Port-A-Cath was removed by IR on 10/30/2021.  Cardiologist is concerned about performing TEE because of esophageal dysmotility with air-fluid levels on CT chest.  Follow-up with ID.   Large left pleural effusion on CT chest: S/p left-sided thoracentesis with removal of 500 mL of yellowish fluid.  Cytology is pending.  Vocal cord paralysis diagnosed by otolaryngologist: CT neck also showed possible left vocal cord weakness or paralysis.  Appreciate input from Dr. Richardson Landry, otolaryngologist.  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  CT chest showed interval improvement in the pulmonary nodules.  Follow-up with oncologist.  Chemotherapy-induced pancytopenia: Continue Tbo-filgrastim.  No indication for platelet or blood transfusion at this time.  Nausea and vomiting: Improving.  Antiemetics as needed.  Hypokalemia, hypophosphatemia and hypomagnesemia: Improved  Pyuria, multidrug-resistant E. coli in urine suspected to be due to colonization:  Abnormal head CT: No Chiari I malformation was identified on MRI brain.  Abnormal EKG (T wave inversions in leads I and aVL, prolonged QTc-460), elevated troponins: Repeat EKG on 10/31/2021 still shows prolonged QTc-494, T wave inversions in  leads I and aVL.  No chest pain. Mildly elevated troponins likely from demand ischemia.    AKI: Improved    Diet Order             Diet regular Room service appropriate?  Yes; Fluid consistency: Thin  Diet effective now                        Consultants: Cardiologist, otolaryngologist, interventional radiologist, oncologist, infectious disease  Procedures: Port-A-Cath removal on 10/30/2021    Medications:    Chlorhexidine Gluconate Cloth  6 each Topical Q0600   feeding supplement  1 Container Oral TID BM   LORazepam  0.5 mg Oral TID   multivitamin with minerals  1 tablet Oral Daily   mouth rinse  15 mL Mouth Rinse 4 times per day   scopolamine  1 patch Transdermal Q72H   Continuous Infusions:  sodium chloride 20 mL/hr at 11/01/21 0314   fluconazole (DIFLUCAN) IV Stopped (10/31/21 1640)   promethazine (PHENERGAN) injection (IM or IVPB) Stopped (10/30/21 1702)     Anti-infectives (From admission, onward)    Start     Dose/Rate Route Frequency Ordered Stop   10/30/21 1600  fluconazole (DIFLUCAN) IVPB 400 mg        400 mg 100 mL/hr over 120 Minutes Intravenous Every 24 hours 10/29/21 1250     10/29/21 2300  vancomycin (VANCOREADY) IVPB 750 mg/150 mL  Status:  Discontinued        750 mg 150 mL/hr over 60 Minutes Intravenous Every 24 hours 10/28/21 2234 10/29/21 1028   10/29/21 1630  fluconazole (DIFLUCAN) IVPB 400 mg       See Hyperspace for full Linked Orders Report.   400 mg 100 mL/hr over 120 Minutes Intravenous  Once 10/29/21 1250 10/29/21 1858   10/29/21 1430  fluconazole (DIFLUCAN) IVPB 400 mg       See Hyperspace for full Linked Orders Report.   400 mg 100 mL/hr over 120 Minutes Intravenous  Once 10/29/21 1250 10/29/21 1607   10/29/21 1315  micafungin (MYCAMINE) 100 mg in sodium chloride 0.9 % 100 mL IVPB  Status:  Discontinued        100 mg 105 mL/hr over 1 Hours Intravenous Every 24 hours 10/29/21 1220 10/29/21 1250   10/29/21 1200  vancomycin (VANCOREADY) IVPB 750 mg/150 mL  Status:  Discontinued        750 mg 150 mL/hr over 60 Minutes Intravenous Every 12 hours 10/29/21 1028 10/29/21 1428   10/28/21 2245  meropenem  (MERREM) 1 g in sodium chloride 0.9 % 100 mL IVPB  Status:  Discontinued        1 g 200 mL/hr over 30 Minutes Intravenous Every 8 hours 10/28/21 2231 10/29/21 1953   10/28/21 2245  vancomycin (VANCOREADY) IVPB 1750 mg/350 mL        1,750 mg 175 mL/hr over 120 Minutes Intravenous  Once 10/28/21 2231 10/29/21 0119   10/28/21 2015  ceFEPIme (MAXIPIME) 2 g in sodium chloride 0.9 % 100 mL IVPB        2 g 200 mL/hr over 30 Minutes Intravenous  Once 10/28/21 2009 10/28/21 2151   10/28/21 1845  cefTRIAXone (ROCEPHIN) 1 g in sodium chloride 0.9 % 100 mL IVPB  Status:  Discontinued        1 g 200 mL/hr over 30 Minutes Intravenous  Once 10/28/21 1838 10/28/21 1839  Family Communication/Anticipated D/C date and plan/Code Status   DVT prophylaxis: SCDs Start: 10/28/21 2223     Code Status: Full Code  Family Communication: Plan discussed with her husband at the bedside Disposition Plan: To be determined.  She will probably need SNF at discharge.   Status is: Inpatient Remains inpatient appropriate because: Candidemia on IV fluconazole       Subjective:   She complains of nausea, abdominal pain and cough.  No vomiting, shortness of breath, diarrhea or constipation.  Her husband was at the bedside.  Objective:    Vitals:   10/31/21 1549 10/31/21 2101 11/01/21 0547 11/01/21 0816  BP: (!) 153/87 (!) 152/80 (!) 151/80 (!) 144/92  Pulse: 91 89 92 95  Resp: '18 18 16 17  '$ Temp: 98 F (36.7 C) 98.1 F (36.7 C) 97.9 F (36.6 C) 98.2 F (36.8 C)  TempSrc: Oral Oral Oral   SpO2: 98% 94% 95% 97%  Weight:      Height:       No data found.   Intake/Output Summary (Last 24 hours) at 11/01/2021 1124 Last data filed at 11/01/2021 6720 Gross per 24 hour  Intake 1927.95 ml  Output 500 ml  Net 1427.95 ml   Filed Weights   10/28/21 1529 10/29/21 0159  Weight: 77.1 kg 73 kg    Exam:  GEN: NAD SKIN: Warm and dry EYES: EOMI ENT: MMM CV: RRR PULM: CTA B ABD:  soft, obese, NT, +BS CNS: AAO x 3, non focal EXT: No edema or tenderness        Data Reviewed:   I have personally reviewed following labs and imaging studies:  Labs: Labs show the following:   Basic Metabolic Panel: Recent Labs  Lab 10/28/21 1531 10/29/21 0554 10/30/21 0514 10/31/21 0349 11/01/21 0416  NA 134* 137 134* 136 135  K 3.6 3.4* 3.3* 3.8 3.6  CL 99 104 102 102 101  CO2 '28 28 26 27 28  '$ GLUCOSE 107* 90 90 77 86  BUN 23* '18 14 14 15  '$ CREATININE 1.10* 0.85 0.85 0.94 0.90  CALCIUM 8.7* 8.4* 8.4* 8.3* 8.3*  MG  --   --  1.4* 1.7 2.1  PHOS  --   --  2.6 2.3* 3.7   GFR Estimated Creatinine Clearance: 65.1 mL/min (by C-G formula based on SCr of 0.9 mg/dL). Liver Function Tests: Recent Labs  Lab 10/28/21 1531 10/30/21 0514  AST 30 24  ALT 42 30  ALKPHOS 68 64  BILITOT 0.8 1.0  PROT 6.4* 6.1*  ALBUMIN 3.3* 2.9*   Recent Labs  Lab 10/28/21 1531  LIPASE 23   No results for input(s): "AMMONIA" in the last 168 hours. Coagulation profile No results for input(s): "INR", "PROTIME" in the last 168 hours.  CBC: Recent Labs  Lab 10/28/21 1531 10/29/21 0554 10/30/21 0514 10/30/21 1758 11/01/21 0416  WBC 1.0*  1.0* 1.0*  1.0* 2.5* 3.8* 4.7  NEUTROABS 0.1* 0.2* 1.8 3.0 3.4  HGB 8.7*  8.8* 7.8*  7.9* 8.1* 7.9* 7.9*  HCT 26.1*  26.4* 23.3*  23.6* 24.0* 23.4* 24.0*  MCV 93.2  92.3 92.8  93.3 91.6 91.1 93.0  PLT 61*  59* 50*  47* 43* 39* 37*   Cardiac Enzymes: No results for input(s): "CKTOTAL", "CKMB", "CKMBINDEX", "TROPONINI" in the last 168 hours. BNP (last 3 results) No results for input(s): "PROBNP" in the last 8760 hours. CBG: No results for input(s): "GLUCAP" in the last 168 hours. D-Dimer: No results for input(s): "DDIMER"  in the last 72 hours. Hgb A1c: No results for input(s): "HGBA1C" in the last 72 hours. Lipid Profile: No results for input(s): "CHOL", "HDL", "LDLCALC", "TRIG", "CHOLHDL", "LDLDIRECT" in the last 72  hours. Thyroid function studies: No results for input(s): "TSH", "T4TOTAL", "T3FREE", "THYROIDAB" in the last 72 hours.  Invalid input(s): "FREET3" Anemia work up: No results for input(s): "VITAMINB12", "FOLATE", "FERRITIN", "TIBC", "IRON", "RETICCTPCT" in the last 72 hours. Sepsis Labs: Recent Labs  Lab 10/28/21 1531 10/28/21 1851 10/28/21 2025 10/29/21 0554 10/30/21 0514 10/30/21 1758 11/01/21 0416  PROCALCITON <0.10  --   --   --   --   --   --   WBC 1.0*  1.0*  --   --  1.0*  1.0* 2.5* 3.8* 4.7  LATICACIDVEN  --  1.3 0.7  --   --   --   --     Microbiology Recent Results (from the past 240 hour(s))  Urine Culture     Status: Abnormal   Collection Time: 10/28/21  3:30 PM   Specimen: Urine, Clean Catch  Result Value Ref Range Status   Specimen Description   Final    URINE, CLEAN CATCH Performed at [facility], [address] [place], [place] [address]    Special Requests   Final    NONE Performed at [facility], [address] [place], [place] [address]    Culture (A)  Final    >=100,000 COLONIES/mL ESCHERICHIA COLI Confirmed Extended Spectrum Beta-Lactamase Producer (ESBL).  In bloodstream infections from ESBL organisms, carbapenems are preferred over piperacillin/tazobactam. They are shown to have a lower risk of mortality.    Report Status 10/31/2021 FINAL  Final   Organism ID, Bacteria ESCHERICHIA COLI (A)  Final      Susceptibility   Escherichia coli - MIC*    AMPICILLIN >=32 RESISTANT Resistant     CEFAZOLIN >=64 RESISTANT Resistant     CEFEPIME 16 RESISTANT Resistant     CEFTRIAXONE >=64 RESISTANT Resistant     CIPROFLOXACIN 2 RESISTANT Resistant     GENTAMICIN >=16 RESISTANT Resistant     IMIPENEM <=0.25 SENSITIVE Sensitive     NITROFURANTOIN 32 SENSITIVE Sensitive     TRIMETH/SULFA >=320 RESISTANT Resistant     AMPICILLIN/SULBACTAM >=32 RESISTANT Resistant     PIP/TAZO <=4 SENSITIVE Sensitive     * >=100,000 COLONIES/mL  ESCHERICHIA COLI  SARS Coronavirus 2 by RT PCR (hospital order, performed in [place] hospital lab) *cepheid single result test* Anterior Nasal Swab     Status: None   Collection Time: 10/28/21  6:51 PM   Specimen: Anterior Nasal Swab  Result Value Ref Range Status   SARS Coronavirus 2 by RT PCR NEGATIVE NEGATIVE Final    Comment: (NOTE) SARS-CoV-2 target nucleic acids are NOT DETECTED.  The SARS-CoV-2 RNA is generally detectable in upper and lower respiratory specimens during the acute phase of infection. The lowest concentration of SARS-CoV-2 viral copies this assay can detect is 250 copies / mL. A negative result does not preclude SARS-CoV-2 infection and should not be used as the sole basis for treatment or other patient management decisions.  A negative result may occur with improper specimen collection / handling, submission of specimen other than nasopharyngeal swab, presence of viral mutation(s) within the areas targeted by this assay, and inadequate number of viral copies (<250 copies / mL). A negative result must be combined with clinical observations, patient history, and epidemiological information.  Fact Sheet for Patients:  https://www.patel.info/  Fact Sheet for Healthcare Providers: https://hall.com/  This test is not yet approved or  cleared by the [place] FDA and has been authorized for detection and/or diagnosis of SARS-CoV-2 by FDA under an Emergency Use Authorization (EUA).  This EUA will remain in effect (meaning this test can be used) for the duration of the COVID-19 declaration under Section 564(b)(1) of the Act, 21 U.S.C. section 360bbb-3(b)(1), unless the authorization is terminated or revoked sooner.  Performed at [facility], [address] [place], [place] [address]   Blood culture (routine x 2)     Status: Abnormal (Preliminary result)   Collection Time: 10/28/21  8:25 PM   Specimen:  BLOOD  Result Value Ref Range Status   Specimen Description   Final    BLOOD LEFT ASSIST CONTROL Performed at [facility], [address], [place], [place] [address]    Special Requests   Final    BOTTLES DRAWN AEROBIC AND ANAEROBIC Blood Culture adequate volume Performed at [facility], [address] [place], [place] [address]    Culture  Setup Time   Final    YEAST IN BOTH AEROBIC AND ANAEROBIC BOTTLES CRITICAL VALUE NOTED.  VALUE IS CONSISTENT WITH PREVIOUSLY REPORTED AND CALLED VALUE. Performed at [facility], [address] [place], [place] [address]    Culture CANDIDA ALBICANS (A)  Final   Report Status PENDING  Incomplete  Blood culture (routine x 2)     Status: Abnormal (Preliminary result)   Collection Time: 10/28/21  8:25 PM   Specimen: BLOOD  Result Value Ref Range Status   Specimen Description   Final    BLOOD PORTA CATH Performed at [facility], [address] [place], [place] [address]    Special Requests   Final    BOTTLES DRAWN AEROBIC AND ANAEROBIC Blood Culture adequate volume Performed at [facility], [address] [place], [place] [address]    Culture  Setup Time   Final    Organism ID to follow IN BOTH AEROBIC AND ANAEROBIC BOTTLES YEAST CRITICAL RESULT CALLED TO, READ BACK BY AND VERIFIED WITH: Carey Bullocks 10/29/2021 at 1137 SRR Performed at [facility], [address] [place], [place] [address]    Culture (A)  Final    CANDIDA ALBICANS Sent to [place] for further susceptibility testing. Performed at [facility], [place] [address], [place],  [address]    Report Status PENDING  Incomplete  Blood Culture ID Panel (Reflexed)     Status: Abnormal   Collection Time: 10/28/21  8:25 PM  Result Value Ref Range Status   Enterococcus faecalis NOT DETECTED NOT DETECTED Final   Enterococcus Faecium NOT DETECTED NOT DETECTED Final   Listeria monocytogenes NOT DETECTED NOT  DETECTED Final   Staphylococcus species NOT DETECTED NOT DETECTED Final   Staphylococcus aureus (BCID) NOT DETECTED NOT DETECTED Final   Staphylococcus epidermidis NOT DETECTED NOT DETECTED Final   Staphylococcus lugdunensis NOT DETECTED NOT DETECTED Final   Streptococcus species NOT DETECTED NOT DETECTED Final   Streptococcus agalactiae NOT DETECTED NOT DETECTED Final   Streptococcus pneumoniae NOT DETECTED NOT DETECTED Final   Streptococcus pyogenes NOT DETECTED NOT DETECTED Final   A.calcoaceticus-baumannii NOT DETECTED NOT DETECTED Final   Bacteroides fragilis NOT DETECTED NOT DETECTED Final   Enterobacterales NOT DETECTED NOT DETECTED Final   Enterobacter cloacae complex NOT DETECTED NOT DETECTED Final   Escherichia coli NOT DETECTED NOT DETECTED Final   Klebsiella  aerogenes NOT DETECTED NOT DETECTED Final   Klebsiella oxytoca NOT DETECTED NOT DETECTED Final   Klebsiella pneumoniae NOT DETECTED NOT DETECTED Final   Proteus species NOT DETECTED NOT DETECTED Final   Salmonella species NOT DETECTED NOT DETECTED Final   Serratia marcescens NOT DETECTED NOT DETECTED Final   Haemophilus influenzae NOT DETECTED NOT DETECTED Final   Neisseria meningitidis NOT DETECTED NOT DETECTED Final   Pseudomonas aeruginosa NOT DETECTED NOT DETECTED Final   Stenotrophomonas maltophilia NOT DETECTED NOT DETECTED Final   Candida albicans DETECTED (A) NOT DETECTED Final    Comment: CRITICAL RESULT CALLED TO, READ BACK BY AND VERIFIED WITH: Carey Bullocks 10/29/2021 at 1137 SRR    Candida auris NOT DETECTED NOT DETECTED Final   Candida glabrata NOT DETECTED NOT DETECTED Final   Candida krusei NOT DETECTED NOT DETECTED Final   Candida parapsilosis NOT DETECTED NOT DETECTED Final   Candida tropicalis NOT DETECTED NOT DETECTED Final   Cryptococcus neoformans/gattii NOT DETECTED NOT DETECTED Final    Comment: Performed at [facility], [address] [place], [place] [address]  MRSA Next Gen by  PCR, Nasal     Status: None   Collection Time: 10/29/21 12:11 PM   Specimen: Nasal Mucosa; Nasal Swab  Result Value Ref Range Status   MRSA by PCR Next Gen NOT DETECTED NOT DETECTED Final    Comment: (NOTE) The GeneXpert MRSA Assay (FDA approved for NASAL specimens only), is one component of a comprehensive MRSA colonization surveillance program. It is not intended to diagnose MRSA infection nor to guide or monitor treatment for MRSA infections. Test performance is not FDA approved in patients less than 72 years old. Performed at [facility], [address] [place], [place] [address]   Culture, blood (Routine X 2) w Reflex to ID Panel     Status: None (Preliminary result)   Collection Time: 10/29/21  2:41 PM   Specimen: BLOOD  Result Value Ref Range Status   Specimen Description BLOOD RIGHT ANTECUBITAL  Final   Special Requests   Final    BOTTLES DRAWN AEROBIC AND ANAEROBIC Blood Culture adequate volume   Culture   Final    NO GROWTH 3 DAYS Performed at [facility], [address], [place], [place] [address]    Report Status PENDING  Incomplete  Culture, blood (Routine X 2) w Reflex to ID Panel     Status: None (Preliminary result)   Collection Time: 10/29/21  3:00 PM   Specimen: BLOOD  Result Value Ref Range Status   Specimen Description BLOOD LEFT ANTECUBITAL  Final   Special Requests   Final    BOTTLES DRAWN AEROBIC AND ANAEROBIC Blood Culture adequate volume   Culture   Final    NO GROWTH 3 DAYS Performed at [facility], [address] [place], [place] [address]    Report Status PENDING  Incomplete  Culture, blood (Routine X 2) w Reflex to ID Panel     Status: None (Preliminary result)   Collection Time: 10/30/21  5:14 AM   Specimen: BLOOD  Result Value Ref Range Status   Specimen Description BLOOD LEFT [facility]  Final   Special Requests   Final    BOTTLES DRAWN AEROBIC AND ANAEROBIC Blood Culture adequate volume   Culture   Final    NO  GROWTH 2 DAYS Performed at [facility], [address], [place], [place] [address]    Report Status PENDING  Incomplete  Culture, blood (Routine  X 2) w Reflex to ID Panel     Status: None (Preliminary result)   Collection Time: 10/30/21  5:15 AM   Specimen: BLOOD  Result Value Ref Range Status   Specimen Description BLOOD LEFT HAND  Final   Special Requests   Final    BOTTLES DRAWN AEROBIC AND ANAEROBIC Blood Culture adequate volume   Culture   Final    NO GROWTH 2 DAYS Performed at [facility], [address] [place], [place] [address]    Report Status PENDING  Incomplete  Cath Tip Culture     Status: None (Preliminary result)   Collection Time: 10/30/21  3:51 PM   Specimen: Catheter Tip  Result Value Ref Range Status   Specimen Description CATH TIP  Final   Special Requests CHEST PORT CATH TIP  Final   Culture   Final    NO GROWTH < 12 HOURS Performed at [facility], [place] [address], [place], [place] [address]    Report Status PENDING  Incomplete  Body fluid culture w Gram Stain     Status: None (Preliminary result)   Collection Time: 10/31/21 12:25 PM   Specimen: PATH Cytology Pleural fluid  Result Value Ref Range Status   Specimen Description   Final    PLEURAL Performed at [facility], [address] [place], [place] [address]    Special Requests   Final    NONE Performed at [facility], [place], [place] [address]    Gram Stain NO WBC SEEN NO ORGANISMS SEEN   Final   Culture   Final    NO GROWTH < 24 HOURS Performed at [facility], [place] [address] [place], [place] [address]    Report Status PENDING  Incomplete    Procedures and diagnostic studies:  US THORACENTESIS ASP PLEURAL SPACE W/IMG GUIDE  Result Date: 10/31/2021 INDICATION: Left pleural effusion EXAM: ULTRASOUND GUIDED LEFT THORACENTESIS MEDICATIONS: 8 cc 1% lidocaine COMPLICATIONS: None immediate. PROCEDURE: An ultrasound guided  thoracentesis was thoroughly discussed with the patient and questions answered. The benefits, risks, alternatives and complications were also discussed. The patient understands and wishes to proceed with the procedure. Written consent was obtained. Ultrasound was performed to localize and mark an adequate pocket of fluid in the left chest. The area was then prepped and draped in the normal sterile fashion. 1% Lidocaine was used for local anesthesia. Under ultrasound guidance a 6 Fr Safe-T-Centesis catheter was introduced. Thoracentesis was performed. The catheter was removed and a dressing applied. FINDINGS: A total of approximately 500 mL of yellow fluid was removed. Samples were sent to the laboratory as requested by the clinical team IMPRESSION: Successful ultrasound guided left thoracentesis yielding 500 mL of pleural fluid. Follow-up chest x-ray revealed no pneumothorax. Read by: Reatha Armour, PA-C Electronically Signed   By: Corrie Mckusick D.O.   On: 10/31/2021 13:10   DG Chest Port 1 View  Result Date: 10/31/2021 CLINICAL DATA:  Status post thoracentesis. EXAM: PORTABLE CHEST 1 VIEW COMPARISON:  10/28/2021. FINDINGS: Large left pleural effusion with overlying left basilar opacities. Right lung is clear. Prominent right-sided nipple shadow. No visible pneumothorax. Cardiomediastinal silhouette is partially obscured, but enlarged. Contrast and partially imaged stomach and bowel. IMPRESSION: Large left pleural effusion with overlying left basilar opacities. No visible pneumothorax. Electronically Signed   By: Margaretha Sheffield M.D.   On: 10/31/2021 12:51   CT CHEST W CONTRAST  Result Date: 10/30/2021 CLINICAL DATA:  Vocal cord paralysis, history of breast cancer with pulmonary metastasis; * Tracking Code: BO * EXAM: CT CHEST WITH CONTRAST TECHNIQUE: Multidetector CT imaging of the chest was performed during intravenous contrast administration. RADIATION DOSE REDUCTION: This exam was performed according to  the departmental dose-optimization program which includes automated exposure control, adjustment of the mA and/or kV according to patient size and/or use of iterative reconstruction technique. CONTRAST:  25m OMNIPAQUE IOHEXOL 300 MG/ML  SOLN COMPARISON:  Chest CT dated August 10, 2021 FINDINGS: Cardiovascular: Normal heart size. No pericardial effusion. No coronary artery calcifications. Normal caliber thoracic aorta with mild atherosclerotic disease. Mediastinum/Nodes: Dilated esophagus with air-fluid level. Mildly heterogeneous thyroid gland which is decreased in size when compared with prior CT. No pathologically enlarged lymph nodes seen in the chest. Lungs/Pleura: Central airways are patent. Large left pleural effusion with atelectasis. Bilateral solid pulmonary nodules are decreased or no longer present, although portions of the left lung are not visualized due to large pleural effusion which somewhat limits evaluation. Reference subpleural nodule of the left upper lobe measuring 5 mm on series 4, image 17, previously measured 9 mm. Upper Abdomen: Cholelithiasis.  No acute abnormality. Musculoskeletal: Soft tissue gas of the right anterior chest wall, compatible with history of recent port removal. No acute or significant osseous findings. IMPRESSION: 1. Large left pleural effusion with associated atelectasis. 2. Bilateral solid pulmonary nodules are decreased in size or no longer present. 3. Dilated esophagus with air-fluid level, findings can be seen in the setting of esophageal dysmotility. 4. Mildly heterogeneous thyroid gland, decreased in size when compared with prior chest CT. 5. Aortic Atherosclerosis (ICD10-I70.0). Electronically Signed   By: LYetta GlassmanM.D.   On: 10/30/2021 16:40   CT SOFT TISSUE NECK W CONTRAST  Result Date: 10/30/2021 CLINICAL DATA:  Vocal cord paralysis, history of breast cancer EXAM: CT NECK WITH CONTRAST TECHNIQUE: Multidetector CT imaging of the neck was performed  using the standard protocol following the bolus administration of intravenous contrast. RADIATION DOSE REDUCTION: This exam was performed according to the departmental dose-optimization program which includes automated exposure control, adjustment of the mA and/or kV according to patient size and/or use of iterative reconstruction technique. CONTRAST:  742mOMNIPAQUE IOHEXOL 300 MG/ML  SOLN COMPARISON:  Correlation made with CTA neck from 2021 FINDINGS: Pharynx and larynx: No mass or swelling. Appearance suggestive of left vocal cord weakness or paralysis. Salivary glands: Parotid and submandibular glands are unremarkable. Thyroid: Unremarkable. Lymph nodes: A mildly enlarged left supraclavicular node was not present on the prior study. This measures 1.2 cm on series 3, image 74. Vascular: Major neck vessels are patent. Partially retropharyngeal course of the ICAs. Limited intracranial: No abnormal enhancement. 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 Visualized orbits: Unremarkable. Mastoids and visualized paranasal sinuses: No significant opacification. Skeleton: No acute osseous abnormality. Upper chest: Postprocedural changes of same day chest port removal. Refer to concurrent dedicated chest CT. Other: None. IMPRESSION: Possible left vocal cord weakness or paralysis. Correlate with clinical exam. Indeterminate mildly enlarged left supraclavicular lymph node. Electronically Signed   By: PrMacy Mis.D.   On: 10/30/2021 16:33   IR REMOVAL TUN ACCESS W/ PORT W/O FL MOD SED  Result Date: 10/30/2021 INDICATION: Port removal, infection EXAM: Port removal MEDICATIONS: Per EMR ANESTHESIA/SEDATION: Moderate (conscious) sedation was employed during this procedure. A total of Versed 1 mg and Fentanyl 50 mcg was administered intravenously. Moderate Sedation Time: 12 minutes. The patient's level of consciousness and vital signs were monitored continuously by radiology nursing throughout  the procedure under my direct  supervision. FLUOROSCOPY TIME:  N/a COMPLICATIONS: None immediate. PROCEDURE: Informed written consent was obtained from the patient after a thorough discussion of the procedural risks, benefits and alternatives. All questions were addressed. Maximal Sterile Barrier Technique was utilized including caps, mask, sterile gowns, sterile gloves, sterile drape, hand hygiene and skin antiseptic. A timeout was performed prior to the initiation of the procedure. The right chest was prepped and draped in the standard sterile fashion. Lidocaine was utilized for local anesthesia. An incision was made over the previously healed surgical incision. Utilizing blunt dissection, the port catheter and reservoir were removed from the underlying subcutaneous tissue in their entirety. After hemostasis, the subcutaneous pockets was closed in two layers using 3-0 and 4-0 Vicryl. Dermabond was also applied. The patient tolerated the procedure well without immediate complication. IMPRESSION: Successful removal of right-sided chest port. The catheter tip and port were sent for microbiology culture. Electronically Signed   By: Albin Felling M.D.   On: 10/30/2021 16:14               LOS: 4 days   Mykalah Saari  [facility]   Pager on [email or web address]. If 7PM-7AM, please contact night-coverage at [email or web address]     11/01/2021, 11:24 AM

## 2021-11-01 NOTE — Progress Notes (Signed)
   10/31/21 2100  Mobility  HOB Elevated/Bed Position Self regulated  Activity Refused mobility (nausea, vomiting)  Range of Motion/Exercises Active;All extremities  Level of Assistance Moderate assist, patient does Administrator, sports wheel walker

## 2021-11-02 DIAGNOSIS — R112 Nausea with vomiting, unspecified: Secondary | ICD-10-CM | POA: Diagnosis not present

## 2021-11-02 DIAGNOSIS — B377 Candidal sepsis: Secondary | ICD-10-CM | POA: Diagnosis not present

## 2021-11-02 DIAGNOSIS — T451X5A Adverse effect of antineoplastic and immunosuppressive drugs, initial encounter: Secondary | ICD-10-CM | POA: Diagnosis not present

## 2021-11-02 LAB — CATH TIP CULTURE: Culture: >=100000 — AB

## 2021-11-02 NOTE — Progress Notes (Signed)
Progress Note    Jill Rodriguez  DUK:[id number] DOB: 07-26-1960  DOA: 10/28/2021 PCP: Pcp, No      Brief Narrative:    Medical records reviewed and are as summarized below:   Jill Rodriguez is a 61 y.o. 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 coli UTI sensitive only to ertapenem and nitrofurantoin, with last chemotherapy infusion on 6/23 who presents to the ED with intractable nausea and vomiting that started 2 days prior.   During her last chemotherapy session she developed acute nausea and vomiting that was treated with Compazine.  Her nausea and vomiting was managed for the first few days with dexamethasone, Marinol, Zofran, Phenergan suppository and scopolamine patch, however 2 days ago she developed intractable vomiting associated with weakness.  According to her husband at the bedside, patient has been having bouts of coughing for the past several weeks and now every time she has a bout of coughing she vomits.  He feels like this happens every 4-6 hours over the past two days.  She has not had shortness of breath, chest pain or fever.  She's been admitted with nausea/vomiting, neutropenia.  She's now been shown to have candidemia.  Currently on broad spectrum antiinfectives.  ID and oncology consulted.  She has markedly abnormal EKG, cardiology c/s pending.   See below for additional details      Assessment/Plan:   Principal Problem:   Chemotherapy induced nausea and vomiting Active Problems:   Neutropenic fever (HCC)   Candidemia (HCC)   Pyuria   HCAP (healthcare-associated pneumonia)   Pancytopenia due to antineoplastic chemotherapy (HCC)   Abnormal EKG   AKI (acute kidney injury)  (HCC)   Abnormal CT of the head   Chemotherapy induced neutropenia (HCC)   Vocal cord paralysis   Pleural effusion on left   S/P thoracentesis   Nutrition Problem: Inadequate oral intake Etiology: poor appetite  Signs/Symptoms: per patient/family report   Body mass index is 27.62 kg/m.   Candidemia with Candida albicans, neutropenic fever: Port-A-Cath tip culture and blood culture showed Candida albicans.  No growth on repeat blood cultures.  Continue IV fluconazole. Right upper chest Port-A-Cath was removed by IR on 10/30/2021.  Cardiologist is concerned about performing TEE because of esophageal dysmotility with air-fluid levels on CT chest.  Follow-up with ID.   Large left pleural effusion on CT chest: S/p left-sided thoracentesis with removal of 500 mL of yellowish fluid on 10/31/2021.  Cytology is pending.  Vocal cord paralysis diagnosed by otolaryngologist: CT neck also showed possible left vocal cord weakness or paralysis.  Speech therapist recommended regular diet.    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  CT chest showed interval improvement in the pulmonary nodules.  Follow-up with oncologist.  Chemotherapy-induced pancytopenia: Leukopenia has resolved.  She is still anemic and thrombocytopenic but no indication for transfusion at this time.   Nausea and vomiting: Improving.  Antiemetics as needed.  Hypokalemia, hypophosphatemia and hypomagnesemia: Improved  Pyuria, multidrug-resistant E. coli in urine suspected to be due to colonization:  Abnormal head CT: No Chiari I malformation was identified on MRI brain.  Abnormal EKG (T wave inversions in leads I and aVL, prolonged  QTc-460), elevated troponins: Repeat EKG on 10/31/2021 still shows prolonged QTc-494, T wave inversions in leads I and aVL.  No chest pain. Mildly elevated troponins likely from demand ischemia.    AKI:  Improved    Diet Order             Diet regular Room service appropriate? Yes; Fluid consistency: Thin  Diet effective now                        Consultants: Cardiologist, otolaryngologist, interventional radiologist, oncologist, infectious disease  Procedures: Port-A-Cath removal on 10/30/2021    Medications:    feeding supplement  1 Container Oral TID BM   LORazepam  0.5 mg Oral TID   multivitamin with minerals  1 tablet Oral Daily   mouth rinse  15 mL Mouth Rinse 4 times per day   scopolamine  1 patch Transdermal Q72H   Continuous Infusions:  fluconazole (DIFLUCAN) IV 400 mg (11/01/21 1821)   promethazine (PHENERGAN) injection (IM or IVPB) Stopped (10/30/21 1702)     Anti-infectives (From admission, onward)    Start     Dose/Rate Route Frequency Ordered Stop   10/30/21 1600  fluconazole (DIFLUCAN) IVPB 400 mg        400 mg 100 mL/hr over 120 Minutes Intravenous Every 24 hours 10/29/21 1250     10/29/21 2300  vancomycin (VANCOREADY) IVPB 750 mg/150 mL  Status:  Discontinued        750 mg 150 mL/hr over 60 Minutes Intravenous Every 24 hours 10/28/21 2234 10/29/21 1028   10/29/21 1630  fluconazole (DIFLUCAN) IVPB 400 mg       See Hyperspace for full Linked Orders Report.   400 mg 100 mL/hr over 120 Minutes Intravenous  Once 10/29/21 1250 10/29/21 1858   10/29/21 1430  fluconazole (DIFLUCAN) IVPB 400 mg       See Hyperspace for full Linked Orders Report.   400 mg 100 mL/hr over 120 Minutes Intravenous  Once 10/29/21 1250 10/29/21 1607   10/29/21 1315  micafungin (MYCAMINE) 100 mg in sodium chloride 0.9 % 100 mL IVPB  Status:  Discontinued        100 mg 105 mL/hr over 1 Hours Intravenous Every 24 hours 10/29/21 1220 10/29/21 1250   10/29/21 1200  vancomycin (VANCOREADY) IVPB 750 mg/150 mL  Status:  Discontinued        750 mg 150 mL/hr over 60 Minutes Intravenous Every 12 hours 10/29/21 1028 10/29/21 1428   10/28/21 2245  meropenem (MERREM) 1 g in sodium  chloride 0.9 % 100 mL IVPB  Status:  Discontinued        1 g 200 mL/hr over 30 Minutes Intravenous Every 8 hours 10/28/21 2231 10/29/21 1953   10/28/21 2245  vancomycin (VANCOREADY) IVPB 1750 mg/350 mL        1,750 mg 175 mL/hr over 120 Minutes Intravenous  Once 10/28/21 2231 10/29/21 0119   10/28/21 2015  ceFEPIme (MAXIPIME) 2 g in sodium chloride 0.9 % 100 mL IVPB        2 g 200 mL/hr over 30 Minutes Intravenous  Once 10/28/21 2009 10/28/21 2151   10/28/21 1845  cefTRIAXone (ROCEPHIN) 1 g in sodium chloride 0.9 % 100 mL IVPB  Status:  Discontinued        1 g 200 mL/hr over 30 Minutes Intravenous  Once 10/28/21 1838 10/28/21 1839              Family Communication/Anticipated D/C  date and plan/Code Status   DVT prophylaxis: SCDs Start: 10/28/21 2223     Code Status: Full Code  Family Communication: Plan discussed with her husband at the bedside Disposition Plan: To be determined.  She will probably need SNF at discharge.   Status is: Inpatient Remains inpatient appropriate because: Candidemia on IV fluconazole       Subjective:   C/o nausea and occasional cough. No vomiting, shortness of breath, chest pain.  Her husband was at the bedside.  Objective:    Vitals:   11/01/21 0816 11/01/21 1636 11/01/21 2153 11/02/21 0806  BP: (!) 144/92 137/77 137/69 (!) 146/70  Pulse: 95 94 94 (!) 103  Resp: '17 17 17   '$ Temp: 98.2 F (36.8 C) 98.4 F (36.9 C) 99.3 F (37.4 C) 98.3 F (36.8 C)  TempSrc:      SpO2: 97% 97% 95% 100%  Weight:      Height:       No data found.   Intake/Output Summary (Last 24 hours) at 11/02/2021 1152 Last data filed at 11/02/2021 0947 Gross per 24 hour  Intake 302.36 ml  Output --  Net 302.36 ml   Filed Weights   10/28/21 1529 10/29/21 0159  Weight: 77.1 kg 73 kg    Exam:  GEN: NAD SKIN: Warm and dry EYES: EOMI ENT: MMM CV: RRR PULM: CTA B ABD: soft, ND, NT, +BS CNS: AAO x 3, non focal EXT: No edema or tenderness         Data Reviewed:   I have personally reviewed following labs and imaging studies:  Labs: Labs show the following:   Basic Metabolic Panel: Recent Labs  Lab 10/28/21 1531 10/29/21 0554 10/30/21 0514 10/31/21 0349 11/01/21 0416  NA 134* 137 134* 136 135  K 3.6 3.4* 3.3* 3.8 3.6  CL 99 104 102 102 101  CO2 '28 28 26 27 28  '$ GLUCOSE 107* 90 90 77 86  BUN 23* '18 14 14 15  '$ CREATININE 1.10* 0.85 0.85 0.94 0.90  CALCIUM 8.7* 8.4* 8.4* 8.3* 8.3*  MG  --   --  1.4* 1.7 2.1  PHOS  --   --  2.6 2.3* 3.7   GFR Estimated Creatinine Clearance: 65.1 mL/min (by C-G formula based on SCr of 0.9 mg/dL). Liver Function Tests: Recent Labs  Lab 10/28/21 1531 10/30/21 0514  AST 30 24  ALT 42 30  ALKPHOS 68 64  BILITOT 0.8 1.0  PROT 6.4* 6.1*  ALBUMIN 3.3* 2.9*   Recent Labs  Lab 10/28/21 1531  LIPASE 23   No results for input(s): "AMMONIA" in the last 168 hours. Coagulation profile No results for input(s): "INR", "PROTIME" in the last 168 hours.  CBC: Recent Labs  Lab 10/28/21 1531 10/29/21 0554 10/30/21 0514 10/30/21 1758 11/01/21 0416  WBC 1.0*  1.0* 1.0*  1.0* 2.5* 3.8* 4.7  NEUTROABS 0.1* 0.2* 1.8 3.0 3.4  HGB 8.7*  8.8* 7.8*  7.9* 8.1* 7.9* 7.9*  HCT 26.1*  26.4* 23.3*  23.6* 24.0* 23.4* 24.0*  MCV 93.2  92.3 92.8  93.3 91.6 91.1 93.0  PLT 61*  59* 50*  47* 43* 39* 37*   Cardiac Enzymes: No results for input(s): "CKTOTAL", "CKMB", "CKMBINDEX", "TROPONINI" in the last 168 hours. BNP (last 3 results) No results for input(s): "PROBNP" in the last 8760 hours. CBG: No results for input(s): "GLUCAP" in the last 168 hours. D-Dimer: No results for input(s): "DDIMER" in the last 72 hours. Hgb A1c: No results for  input(s): "HGBA1C" in the last 72 hours. Lipid Profile: No results for input(s): "CHOL", "HDL", "LDLCALC", "TRIG", "CHOLHDL", "LDLDIRECT" in the last 72 hours. Thyroid function studies: No results for input(s): "TSH", "T4TOTAL", "T3FREE",  "THYROIDAB" in the last 72 hours.  Invalid input(s): "FREET3" Anemia work up: No results for input(s): "VITAMINB12", "FOLATE", "FERRITIN", "TIBC", "IRON", "RETICCTPCT" in the last 72 hours. Sepsis Labs: Recent Labs  Lab 10/28/21 1531 10/28/21 1851 10/28/21 2025 10/29/21 0554 10/30/21 0514 10/30/21 1758 11/01/21 0416  PROCALCITON <0.10  --   --   --   --   --   --   WBC 1.0*  1.0*  --   --  1.0*  1.0* 2.5* 3.8* 4.7  LATICACIDVEN  --  1.3 0.7  --   --   --   --     Microbiology Recent Results (from the past 240 hour(s))  Urine Culture     Status: Abnormal   Collection Time: 10/28/21  3:30 PM   Specimen: Urine, Clean Catch  Result Value Ref Range Status   Specimen Description   Final    URINE, CLEAN CATCH Performed at [facility], [address] [place], [place] [address]    Special Requests   Final    NONE Performed at [facility], [address], [place], [place] [address]    Culture (A)  Final    >=100,000 COLONIES/mL ESCHERICHIA COLI Confirmed Extended Spectrum Beta-Lactamase Producer (ESBL).  In bloodstream infections from ESBL organisms, carbapenems are preferred over piperacillin/tazobactam. They are shown to have a lower risk of mortality.    Report Status 10/31/2021 FINAL  Final   Organism ID, Bacteria ESCHERICHIA COLI (A)  Final      Susceptibility   Escherichia coli - MIC*    AMPICILLIN >=32 RESISTANT Resistant     CEFAZOLIN >=64 RESISTANT Resistant     CEFEPIME 16 RESISTANT Resistant     CEFTRIAXONE >=64 RESISTANT Resistant     CIPROFLOXACIN 2 RESISTANT Resistant     GENTAMICIN >=16 RESISTANT Resistant     IMIPENEM <=0.25 SENSITIVE Sensitive     NITROFURANTOIN 32 SENSITIVE Sensitive     TRIMETH/SULFA >=320 RESISTANT Resistant     AMPICILLIN/SULBACTAM >=32 RESISTANT Resistant     PIP/TAZO <=4 SENSITIVE Sensitive     * >=100,000 COLONIES/mL ESCHERICHIA COLI  SARS Coronavirus 2 by RT PCR (hospital order, performed in [place]  hospital lab) *cepheid single result test* Anterior Nasal Swab     Status: None   Collection Time: 10/28/21  6:51 PM   Specimen: Anterior Nasal Swab  Result Value Ref Range Status   SARS Coronavirus 2 by RT PCR NEGATIVE NEGATIVE Final    Comment: (NOTE) SARS-CoV-2 target nucleic acids are NOT DETECTED.  The SARS-CoV-2 RNA is generally detectable in upper and lower respiratory specimens during the acute phase of infection. The lowest concentration of SARS-CoV-2 viral copies this assay can detect is 250 copies / mL. A negative result does not preclude SARS-CoV-2 infection and should not be used as the sole basis for treatment or other patient management decisions.  A negative result may occur with improper specimen collection / handling, submission of specimen other than nasopharyngeal swab, presence of viral mutation(s) within the areas targeted by this assay, and inadequate number of viral copies (<250 copies / mL). A negative result must be combined with clinical observations, patient history, and epidemiological information.  Fact Sheet for Patients:   https://www.patel.info/  Fact Sheet for Healthcare Providers: https://hall.com/  This  test is not yet approved or  cleared by the [place] and has been authorized for detection and/or diagnosis of SARS-CoV-2 by FDA under an Emergency Use Authorization (EUA).  This EUA will remain in effect (meaning this test can be used) for the duration of the COVID-19 declaration under Section 564(b)(1) of the Act, 21 U.S.C. section 360bbb-3(b)(1), unless the authorization is terminated or revoked sooner.  Performed at [facility], [address] [place], [place] [address]   Blood culture (routine x 2)     Status: Abnormal (Preliminary result)   Collection Time: 10/28/21  8:25 PM   Specimen: BLOOD  Result Value Ref Range Status   Specimen Description   Final    BLOOD LEFT  ASSIST CONTROL Performed at [facility], [address] [place], [place] [address]    Special Requests   Final    BOTTLES DRAWN AEROBIC AND ANAEROBIC Blood Culture adequate volume Performed at [facility], [address] [place], [place] [address]    Culture  Setup Time   Final    YEAST IN BOTH AEROBIC AND ANAEROBIC BOTTLES CRITICAL VALUE NOTED.  VALUE IS CONSISTENT WITH PREVIOUSLY REPORTED AND CALLED VALUE. Performed at [facility], [address] [place], [place] [address]    Culture CANDIDA ALBICANS (A)  Final   Report Status PENDING  Incomplete  Blood culture (routine x 2)     Status: Abnormal (Preliminary result)   Collection Time: 10/28/21  8:25 PM   Specimen: BLOOD  Result Value Ref Range Status   Specimen Description   Final    BLOOD PORTA CATH Performed at [facility], [address] [place], [place] [address]    Special Requests   Final    BOTTLES DRAWN AEROBIC AND ANAEROBIC Blood Culture adequate volume Performed at  [facility], [address] [place], [place] [address]    Culture  Setup Time   Final    Organism ID to follow IN BOTH AEROBIC AND ANAEROBIC BOTTLES YEAST CRITICAL RESULT CALLED TO, READ BACK BY AND VERIFIED WITH: Carey Bullocks 10/29/2021 at 1137 SRR Performed at [facility], [address] [place], [place] [address]    Culture (A)  Final    CANDIDA ALBICANS Sent to [place] for further susceptibility testing. Performed at [facility], [place] [address], [place], [place] [address]    Report Status PENDING  Incomplete  Blood Culture ID Panel (Reflexed)     Status: Abnormal   Collection Time: 10/28/21  8:25 PM  Result Value Ref Range Status   Enterococcus faecalis NOT DETECTED NOT DETECTED Final   Enterococcus Faecium NOT DETECTED NOT DETECTED Final   Listeria monocytogenes NOT DETECTED NOT DETECTED Final   Staphylococcus species NOT DETECTED NOT DETECTED Final    Staphylococcus aureus (BCID) NOT DETECTED NOT DETECTED Final   Staphylococcus epidermidis NOT DETECTED NOT DETECTED Final   Staphylococcus lugdunensis NOT DETECTED NOT DETECTED Final   Streptococcus species NOT DETECTED NOT DETECTED Final   Streptococcus agalactiae NOT DETECTED NOT DETECTED Final   Streptococcus pneumoniae NOT DETECTED NOT DETECTED Final   Streptococcus pyogenes NOT DETECTED NOT DETECTED Final   A.calcoaceticus-baumannii NOT DETECTED NOT DETECTED Final   Bacteroides fragilis NOT DETECTED NOT DETECTED Final   Enterobacterales NOT DETECTED NOT DETECTED Final   Enterobacter cloacae complex NOT DETECTED NOT DETECTED Final   Escherichia coli NOT DETECTED NOT DETECTED Final   Klebsiella aerogenes NOT DETECTED NOT DETECTED Final   Klebsiella oxytoca  NOT DETECTED NOT DETECTED Final   Klebsiella pneumoniae NOT DETECTED NOT DETECTED Final   Proteus species NOT DETECTED NOT DETECTED Final   Salmonella species NOT DETECTED NOT DETECTED Final   Serratia marcescens NOT DETECTED NOT DETECTED Final   Haemophilus influenzae NOT DETECTED NOT DETECTED Final   Neisseria meningitidis NOT DETECTED NOT DETECTED Final   Pseudomonas aeruginosa NOT DETECTED NOT DETECTED Final   Stenotrophomonas maltophilia NOT DETECTED NOT DETECTED Final   Candida albicans DETECTED (A) NOT DETECTED Final    Comment: CRITICAL RESULT CALLED TO, READ BACK BY AND VERIFIED WITH: Carey Bullocks 10/29/2021 at 1137 SRR    Candida auris NOT DETECTED NOT DETECTED Final   Candida glabrata NOT DETECTED NOT DETECTED Final   Candida krusei NOT DETECTED NOT DETECTED Final   Candida parapsilosis NOT DETECTED NOT DETECTED Final   Candida tropicalis NOT DETECTED NOT DETECTED Final   Cryptococcus neoformans/gattii NOT DETECTED NOT DETECTED Final    Comment: Performed at [facility], [address] [place], [place] [address]  Antifungal AST 9 Drug Panel     Status: None (Preliminary result)   Collection Time: 10/28/21   8:25 PM  Result Value Ref Range Status   Organism ID, Yeast Preliminary report  Final    Comment: (NOTE) Specimen has been received and testing has been initiated. Performed At: [facility] [place], [place] [id number] Rush Farmer MD DG:[id number]    Amphotericin B MIC PENDING  Incomplete   Anidulafungin MIC PENDING  Incomplete   Caspofungin MIC PENDING  Incomplete   Micafungin MIC PENDING  Incomplete   Posaconazole MIC PENDING  Incomplete   Fluconazole Islt MIC PENDING  Incomplete   Flucytosine MIC PENDING  Incomplete   Itraconazole MIC PENDING  Incomplete   Voriconazole MIC PENDING  Incomplete   Source BLOOD  Final    Comment: Performed at [facility], [place] [place], [place] [address]  MRSA Next Gen by PCR, Nasal     Status: None   Collection Time: 10/29/21 12:11 PM   Specimen: Nasal Mucosa; Nasal Swab  Result Value Ref Range Status   MRSA by PCR Next Gen NOT DETECTED NOT DETECTED Final    Comment: (NOTE) The GeneXpert MRSA Assay (FDA approved for NASAL specimens only), is one component of a comprehensive MRSA colonization surveillance program. It is not intended to diagnose MRSA infection nor to guide or monitor treatment for MRSA infections. Test performance is not FDA approved in patients less than 3 years old. Performed at [facility], [address] [place], [place] [address]   Culture, blood (Routine X 2) w Reflex to ID Panel     Status: None (Preliminary result)   Collection Time: 10/29/21  2:41 PM   Specimen: BLOOD  Result Value Ref Range Status   Specimen Description BLOOD RIGHT ANTECUBITAL  Final   Special Requests   Final    BOTTLES DRAWN AEROBIC AND ANAEROBIC Blood Culture adequate volume   Culture   Final    NO GROWTH 4 DAYS Performed at [facility], [address] [place], [place] [address]    Report Status PENDING  Incomplete  Culture, blood (Routine X 2) w Reflex to ID Panel      Status: None (Preliminary result)   Collection Time: 10/29/21  3:00 PM   Specimen: BLOOD  Result Value Ref Range Status   Specimen Description BLOOD LEFT ANTECUBITAL  Final   Special Requests   Final  BOTTLES DRAWN AEROBIC AND ANAEROBIC Blood Culture adequate volume   Culture   Final    NO GROWTH 4 DAYS Performed at [facility], [address] [place], [place] [address]    Report Status PENDING  Incomplete  Culture, blood (Routine X 2) w Reflex to ID Panel     Status: None (Preliminary result)   Collection Time: 10/30/21  5:14 AM   Specimen: BLOOD  Result Value Ref Range Status   Specimen Description BLOOD LEFT [facility]  Final   Special Requests   Final    BOTTLES DRAWN AEROBIC AND ANAEROBIC Blood Culture adequate volume   Culture   Final    NO GROWTH 3 DAYS Performed at [facility], [address], [place], [place] [address]    Report Status PENDING  Incomplete  Culture, blood (Routine X 2) w Reflex to ID Panel     Status: None (Preliminary result)   Collection Time: 10/30/21  5:15 AM   Specimen: BLOOD  Result Value Ref Range Status   Specimen Description BLOOD LEFT HAND  Final   Special Requests   Final    BOTTLES DRAWN AEROBIC AND ANAEROBIC Blood Culture adequate volume   Culture   Final    NO GROWTH 3 DAYS Performed at [facility], [address], [place], [place] [address]    Report Status PENDING  Incomplete  Cath Tip Culture     Status: Abnormal (Preliminary result)   Collection Time: 10/30/21  3:51 PM   Specimen: Catheter Tip  Result Value Ref Range Status   Specimen Description CATH TIP  Final   Special Requests CHEST PORT CATH TIP  Final   Culture (A)  Final    >=100,000 COLONIES/mL YEAST CULTURE REINCUBATED FOR BETTER GROWTH Performed at [facility], [place] [address], [place], [place] [address]    Report Status PENDING  Incomplete  Body fluid culture w Gram Stain     Status: None (Preliminary result)   Collection Time:  10/31/21 12:25 PM   Specimen: PATH Cytology Pleural fluid  Result Value Ref Range Status   Specimen Description   Final    PLEURAL Performed at [facility], [address], [place], [place] [address]    Special Requests   Final    NONE Performed at [facility], [address] [place], [place] [address]    Gram Stain NO WBC SEEN NO ORGANISMS SEEN   Final   Culture   Final    NO GROWTH 2 DAYS Performed at [facility], [place] [address] [place], [place] [address]    Report Status PENDING  Incomplete    Procedures and diagnostic studies:  US THORACENTESIS ASP PLEURAL SPACE W/IMG GUIDE  Result Date: 10/31/2021 INDICATION: Left pleural effusion EXAM: ULTRASOUND GUIDED LEFT THORACENTESIS MEDICATIONS: 8 cc 1% lidocaine COMPLICATIONS: None immediate. PROCEDURE: An ultrasound guided thoracentesis was thoroughly discussed with the patient and questions answered. The benefits, risks, alternatives and complications were also discussed. The patient understands and wishes to proceed with the procedure. Written consent was obtained. Ultrasound was performed to localize and mark an adequate pocket of fluid in the left chest. The area was then prepped and draped in the normal sterile fashion. 1% Lidocaine was used for local anesthesia. Under ultrasound guidance a 6 Fr Safe-T-Centesis catheter was introduced. Thoracentesis was performed. The catheter was removed and a dressing applied. FINDINGS: A total of approximately 500 mL of yellow fluid was removed. Samples were sent  to the laboratory as requested by the clinical team IMPRESSION: Successful ultrasound guided left thoracentesis yielding 500 mL of pleural fluid. Follow-up chest x-ray revealed no pneumothorax. Read by: Reatha Armour, PA-C Electronically Signed   By: Corrie Mckusick D.O.   On: 10/31/2021 13:10   DG Chest Port 1 View  Result Date: 10/31/2021 CLINICAL DATA:  Status post thoracentesis. EXAM: PORTABLE CHEST 1 VIEW  COMPARISON:  10/28/2021. FINDINGS: Large left pleural effusion with overlying left basilar opacities. Right lung is clear. Prominent right-sided nipple shadow. No visible pneumothorax. Cardiomediastinal silhouette is partially obscured, but enlarged. Contrast and partially imaged stomach and bowel. IMPRESSION: Large left pleural effusion with overlying left basilar opacities. No visible pneumothorax. Electronically Signed   By: Margaretha Sheffield M.D.   On: 10/31/2021 12:51               LOS: 5 days   Jill Rodriguez  [facility]   Pager on [email or web address]. If 7PM-7AM, please contact night-coverage at [email or web address]     11/02/2021, 11:52 AM

## 2021-11-03 DIAGNOSIS — J90 Pleural effusion, not elsewhere classified: Secondary | ICD-10-CM | POA: Diagnosis not present

## 2021-11-03 DIAGNOSIS — Z7189 Other specified counseling: Secondary | ICD-10-CM | POA: Diagnosis not present

## 2021-11-03 DIAGNOSIS — R112 Nausea with vomiting, unspecified: Secondary | ICD-10-CM | POA: Diagnosis not present

## 2021-11-03 DIAGNOSIS — T451X5A Adverse effect of antineoplastic and immunosuppressive drugs, initial encounter: Secondary | ICD-10-CM | POA: Diagnosis not present

## 2021-11-03 DIAGNOSIS — B377 Candidal sepsis: Secondary | ICD-10-CM | POA: Diagnosis not present

## 2021-11-03 LAB — CBC WITH DIFFERENTIAL/PLATELET
Abs Immature Granulocytes: 0.01 10*3/uL (ref 0.00–0.07)
Basophils Absolute: 0 10*3/uL (ref 0.0–0.1)
Basophils Relative: 1 %
Eosinophils Absolute: 0 10*3/uL (ref 0.0–0.5)
Eosinophils Relative: 0 %
HCT: 23 % — ABNORMAL LOW (ref 36.0–46.0)
Hemoglobin: 7.7 g/dL — ABNORMAL LOW (ref 12.0–15.0)
Immature Granulocytes: 0 %
Lymphocytes Relative: 30 %
Lymphs Abs: 0.8 10*3/uL (ref 0.7–4.0)
MCH: 31.3 pg (ref 26.0–34.0)
MCHC: 33.5 g/dL (ref 30.0–36.0)
MCV: 93.5 fL (ref 80.0–100.0)
Monocytes Absolute: 0.3 10*3/uL (ref 0.1–1.0)
Monocytes Relative: 11 %
Neutro Abs: 1.6 10*3/uL — ABNORMAL LOW (ref 1.7–7.7)
Neutrophils Relative %: 58 %
Platelets: 33 10*3/uL — ABNORMAL LOW (ref 150–400)
RBC: 2.46 MIL/uL — ABNORMAL LOW (ref 3.87–5.11)
RDW: 15.4 % (ref 11.5–15.5)
Smear Review: DECREASED
WBC: 2.8 10*3/uL — ABNORMAL LOW (ref 4.0–10.5)
nRBC: 0 % (ref 0.0–0.2)

## 2021-11-03 LAB — BASIC METABOLIC PANEL
Anion gap: 4 — ABNORMAL LOW (ref 5–15)
BUN: 12 mg/dL (ref 8–23)
CO2: 29 mmol/L (ref 22–32)
Calcium: 8 mg/dL — ABNORMAL LOW (ref 8.9–10.3)
Chloride: 102 mmol/L (ref 98–111)
Creatinine, Ser: 0.86 mg/dL (ref 0.44–1.00)
GFR, Estimated: >60 mL/min (ref >60)
Glucose, Bld: 120 mg/dL — ABNORMAL HIGH (ref 70–99)
Potassium: 3.3 mmol/L — ABNORMAL LOW (ref 3.5–5.1)
Sodium: 135 mmol/L (ref 135–145)

## 2021-11-03 LAB — CULTURE, BLOOD (ROUTINE X 2)
Culture: NO GROWTH
Culture: NO GROWTH
Special Requests: ADEQUATE
Special Requests: ADEQUATE

## 2021-11-03 LAB — PHOSPHORUS: Phosphorus: 3 mg/dL (ref 2.5–4.6)

## 2021-11-03 LAB — IRON AND TIBC
Iron: 62 ug/dL (ref 28–170)
Saturation Ratios: 46 % — ABNORMAL HIGH (ref 10.4–31.8)
TIBC: 134 ug/dL — ABNORMAL LOW (ref 250–450)
UIBC: 72 ug/dL

## 2021-11-03 LAB — MAGNESIUM: Magnesium: 1.4 mg/dL — ABNORMAL LOW (ref 1.7–2.4)

## 2021-11-03 LAB — FERRITIN: Ferritin: 900 ng/mL — ABNORMAL HIGH (ref 11–307)

## 2021-11-03 MED ORDER — ENSURE ENLIVE PO LIQD
237.0000 mL | Freq: Three times a day (TID) | ORAL | Status: DC
  Administered 2021-11-03 – 2021-11-05 (×4): 237 mL via ORAL

## 2021-11-03 MED ORDER — MAGNESIUM SULFATE 2 GM/50ML IV SOLN
2.0000 g | Freq: Once | INTRAVENOUS | Status: AC
  Administered 2021-11-03: 2 g via INTRAVENOUS
  Filled 2021-11-03: qty 50

## 2021-11-03 MED ORDER — POTASSIUM CHLORIDE CRYS ER 20 MEQ PO TBCR
40.0000 meq | EXTENDED_RELEASE_TABLET | Freq: Once | ORAL | Status: AC
  Administered 2021-11-03: 40 meq via ORAL
  Filled 2021-11-03: qty 2

## 2021-11-03 NOTE — Evaluation (Signed)
Physical Therapy Evaluation Patient Details Name: Jill Rodriguez MRN: [id number] DOB: January 30, 1961 Today's Date: 11/03/2021  History of Present Illness  Pt is a 61 y.o. 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 coli UTI, presents to the ED with intractable nausea and vomiting that started 2 days prior.   Clinical Impression  Patient alert, agreeable to PT with some encouragement, did not speak much to PT/interpretor, husband in room provide most PLOF. Per family pt minimally ambulatory recently without AD, and has needed help with ADLs "for a long time".  The patient was able to move all extremities against gravity. Supine to sit with minA pt reaches for support and endorses her husband helps her at home. Fair sitting balance. Sit <> Stand with RW and minA and she was able to take a few steps towards [facility] with PT management of RW and minA for steadying. Further mobility deferred due to nausea/fatigue.  Overall the patient demonstrated deficits (see "PT Problem List") that impede the patient's functional abilities, safety, and mobility and would benefit from skilled PT intervention. Recommendation at this time is HHPT with constant supervision/assistance to maximize safety, function and independence.   Patient suffers from deconditioning due to squamous cell carcinoma of cervix and metastatic to lungs which impairs his/her ability to perform daily activities like toileting, feeding, dressing, grooming, bathing in the home. A cane, walker, crutch will not resolve the patient's issue with performing activities of daily living. A lightweight wheelchair and cushion is required/recommended and will allow patient to safely perform  daily activities.   Patient can safely propel the wheelchair in the home or has a caregiver who can provide assistance.         Recommendations for follow up therapy are one component of a multi-disciplinary discharge planning process, led by the attending physician.  Recommendations may be updated based on patient status, additional functional criteria and insurance authorization.  Follow Up Recommendations Home health PT      Assistance Recommended at Discharge Frequent or constant Supervision/Assistance  Patient can return home with the following  A lot of help with walking and/or transfers;A lot of help with bathing/dressing/bathroom;Assist for transportation;Direct supervision/assist for financial management;Assistance with cooking/housework;Help with stairs or ramp for entrance    Equipment Recommendations Wheelchair (measurements PT);Wheelchair cushion (measurements PT);BSC/3in1  Recommendations for Other Services       Functional Status Assessment Patient has had a recent decline in their functional status and demonstrates the ability to make significant improvements in function in a reasonable and predictable amount of time.     Precautions / Restrictions Precautions Precautions: Fall Restrictions Weight Bearing Restrictions: No      Mobility  Bed Mobility Overal bed mobility: Needs Assistance Bed Mobility: Supine to Sit, Sit to Supine     Supine to sit: Min assist, HOB elevated Sit to supine: Min assist        Transfers Overall transfer level: Needs assistance Equipment used: Rolling walker (2 wheels) Transfers: Sit to/from Stand Sit to Stand: Min assist                Ambulation/Gait Ambulation/Gait assistance: Min Web designer (Feet): 2 Feet Assistive device: Rolling walker (2 wheels)         General Gait Details:  very slow, max encouragement to complete, minA for weight shift and pt reliant on PT to move RW  Stairs             Wheelchair Mobility    Modified Rankin (Stroke Patients Only)       Balance Overall balance assessment: Needs assistance Sitting-balance support: Feet supported, Bilateral upper extremity supported Sitting balance-Leahy Scale: Fair     Standing balance support: Reliant on assistive device for balance Standing balance-Leahy Scale: Poor                               Pertinent Vitals/Pain Pain Assessment Pain Assessment: Faces Faces Pain Scale: Hurts a little bit Pain Location: N/V - stomach Pain Descriptors / Indicators: Constant Pain Intervention(s): Monitored during session, Repositioned    Home Living Family/patient expects to be discharged to:: Private residence Living Arrangements: Spouse/significant other Available Help at Discharge: Available 24 hours/day Type of Home: Mobile home Home Access: Stairs to enter Entrance Stairs-Rails: Right;Left;Can reach both Entrance Stairs-Number of Steps: 5   Home Layout: One level Home Equipment: Conservation officer, nature (2 wheels)      Prior Function Prior Level of Function : Needs assist       Physical Assist : Mobility (physical);ADLs (physical) Mobility (physical): Bed mobility;Transfers;Gait;Stairs ADLs (physical): Bathing;Dressing;Toileting;IADLs   ADLs Comments: per pt's spouse pt has needed help "for a long time" for ADLs     Hand Dominance   Dominant Hand: Right    Extremity/Trunk Assessment   Upper Extremity Assessment Upper Extremity Assessment: Generalized weakness    Lower Extremity Assessment Lower Extremity Assessment: Generalized weakness    Cervical / Trunk Assessment Cervical / Trunk Assessment: Normal  Communication   Communication: No difficulties  Cognition Arousal/Alertness: Awake/alert Behavior During Therapy: WFL for tasks assessed/performed Overall Cognitive Status: Within Functional Limits for tasks assessed                                           General Comments      Exercises     Assessment/Plan    PT Assessment Patient needs continued PT services  PT Problem List Decreased strength;Decreased mobility;Decreased activity tolerance;Decreased balance;Decreased knowledge of use of DME;Decreased knowledge of precautions       PT Treatment Interventions DME instruction;Therapeutic exercise;Gait training;Stair training;Neuromuscular re-education;Therapeutic activities;Patient/family education;Functional mobility training    PT Goals (Current goals can be found in the Care Plan section)  Acute Rehab PT Goals Patient Stated Goal: to feel better PT Goal Formulation: With patient Time For Goal Achievement: 11/17/21 Potential to Achieve Goals: Good    Frequency Min 2X/week     Co-evaluation               AM-PAC PT "6 Clicks" Mobility  Outcome Measure Help needed turning from your back to your side while in a flat bed without using bedrails?: A Little Help needed moving from lying on your back to sitting on the side of a flat bed without using bedrails?: A Little Help needed moving to and from a bed to a chair (including a wheelchair)?: A Little Help needed standing up from a chair using your arms (e.g., wheelchair or bedside chair)?: A Little Help needed to walk in hospital room?: A Little Help needed climbing 3-5 steps with a railing? : A Little 6 Click Score: 18  End of Session   Activity Tolerance: Patient limited by fatigue;Other (comment) (limited by nausea) Patient left: with call bell/phone within reach;in bed;with bed alarm set Nurse Communication: Mobility status PT Visit Diagnosis: Other abnormalities of gait and mobility (R26.89);Difficulty in walking, not elsewhere classified (R26.2);Muscle weakness (generalized) (M62.81)    Time: 9539-6728 PT Time Calculation (min) (ACUTE ONLY): 34 min   Charges:   PT Evaluation $PT Eval Low Complexity: 1 Low PT Treatments $Therapeutic Activity: 23-37 mins        Lieutenant Diego PT, DPT 1:42 PM,11/03/21

## 2021-11-03 NOTE — Progress Notes (Signed)
   Date of Admission:  10/28/2021      ID: Jill Rodriguez is a 61 y.o. female  Principal Problem:   Chemotherapy induced nausea and vomiting Active Problems:   Chemotherapy induced neutropenia (HCC)   Neutropenic fever (HCC)   Pyuria   AKI (acute kidney injury) ([place])   Pancytopenia due to antineoplastic chemotherapy ([place])   HCAP (healthcare-associated pneumonia)   Candidemia (HCC)   Abnormal EKG   Abnormal CT of the head   Vocal cord paralysis   Pleural effusion on left   S/P thoracentesis  No family at bed side  Subjective: Pt in bed Reluctant to talk, says she is weak Raspy cough  Medications:   feeding supplement  237 mL Oral TID BM   LORazepam  0.5 mg Oral TID   multivitamin with minerals  1 tablet Oral Daily   mouth rinse  15 mL Mouth Rinse 4 times per day   scopolamine  1 patch Transdermal Q72H    Objective: Vital signs in last 24 hours: Temp:  [98.3 F (36.8 C)-99.6 F (37.6 C)] 98.3 F (36.8 C) (07/10 1724) Pulse Rate:  [97-104] 98 (07/10 1724) Resp:  [17-18] 18 (07/10 0832) BP: (112-140)/(68-87) 140/68 (07/10 1724) SpO2:  [90 %-97 %] 96 % (07/10 1724)    PHYSICAL EXAM:  General: lethargic, somnolent, chronically ill Oral cavity no thrush Lungs:b/l air entry Heart: Tachycardia Abdomen: Soft, non-tender,not distended. Bowel sounds normal. No masses Extremities: atraumatic, no cyanosis. No edema. No clubbing Skin: No rashes or lesions. Or bruising Lymph: Cervical, supraclavicular normal. Neurologic: Grossly non-focal  Lab Results Recent Labs    11/01/21 0416 11/03/21 0500  WBC 4.7 2.8*  HGB 7.9* 7.7*  HCT 24.0* 23.0*  NA 135 135  K 3.6 3.3*  CL 101 102  CO2 28 29  BUN 15 12  CREATININE 0.90 0.86    Microbiology: Micro 10/28/21- BC candida albicans 7/5/BC - NG 7/6 BC NG 7/7 body fluid   Pleural fluid cell count 382 LD 129 Protein < 3   Left pleural effusion  Assessment/Plan:  Candidemia- ( albicans) on  Fluconazole PORT has been removed and cath tip has > 100K of candida 2 d echo was a suboptimal study Cardiologist concerned about patulous esophagus with ari fluid level to do TEE as risk of aspiration Will treat for 4-6 weeks  Chronic cough-  could be aspirating from the dilated esophagus Esophageal dysmotility questioned- swallow study okay  Left vocal cord paralysis- swallow study okay  Cervix Ca with mets to the lungs, lymphnodes and thyroid tissue- last chemo on 10/17/21  Anemia, thrombocytopenia, leucopenia- due to chemo  Left pleural effusion- s/p pleurocentesis  Discussed the management with the team

## 2021-11-03 NOTE — Progress Notes (Signed)
OT Cancellation Note  Patient Details Name: Jill Rodriguez MRN: [id number] DOB: [birth year]   Cancelled Treatment:    Reason Eval/Treat Not Completed: Fatigue/lethargy limiting ability to participate. OT order received and chart reviewed. Pt sleeping soundly in bed and with use of video interpreter her husband requests therapy to come at another time to allow pt to rest. OT will follow up at next available time.   Darleen Crocker, MS, OTR/L , CBIS ascom [phone]  11/03/21, 2:30 PM

## 2021-11-03 NOTE — Progress Notes (Incomplete)
   Date of Admission:  10/28/2021   Total days of antibiotics ***        Day ***        Day ***        Day ***   ID: Jill Rodriguez is a 61 y.o. female with  *** Principal Problem:   Chemotherapy induced nausea and vomiting Active Problems:   Chemotherapy induced neutropenia (HCC)   Neutropenic fever (HCC)   Pyuria   AKI (acute kidney injury) (HCC)   Pancytopenia due to antineoplastic chemotherapy (HCC)   HCAP (healthcare-associated pneumonia)   Candidemia (HCC)   Abnormal EKG   Abnormal CT of the head   Vocal cord paralysis   Pleural effusion on left   S/P thoracentesis    Subjective: ***  Medications:   feeding supplement  237 mL Oral TID BM   LORazepam  0.5 mg Oral TID   multivitamin with minerals  1 tablet Oral Daily   mouth rinse  15 mL Mouth Rinse 4 times per day   scopolamine  1 patch Transdermal Q72H    Objective: Vital signs in last 24 hours: Temp:  [98.4 F (36.9 C)-99.6 F (37.6 C)] 98.4 F (36.9 C) (07/10 0832) Pulse Rate:  [95-104] 104 (07/10 0832) Resp:  [16-18] 18 (07/10 0832) BP: (112-140)/(74-87) 112/74 (07/10 0832) SpO2:  [90 %-97 %] 97 % (07/10 0832)  LDA Foley Central lines Other catheters  PHYSICAL EXAM:  General: Alert, cooperative, no distress, appears stated age.  Head: Normocephalic, without obvious abnormality, atraumatic. Eyes: Conjunctivae clear, anicteric sclerae. Pupils are equal ENT Nares normal. No drainage or sinus tenderness. Lips, mucosa, and tongue normal. No Thrush Neck: Supple, symmetrical, no adenopathy, thyroid: non tender no carotid bruit and no JVD. Back: No CVA tenderness. Lungs: Clear to auscultation bilaterally. No Wheezing or Rhonchi. No rales. Heart: Regular rate and rhythm, no murmur, rub or gallop. Abdomen: Soft, non-tender,not distended. Bowel sounds normal. No masses Extremities: atraumatic, no cyanosis. No edema. No clubbing Skin: No rashes or lesions. Or bruising Lymph: Cervical, supraclavicular  normal. Neurologic: Grossly non-focal  Lab Results Recent Labs    11/01/21 0416 11/03/21 0500  WBC 4.7 2.8*  HGB 7.9* 7.7*  HCT 24.0* 23.0*  NA 135 135  K 3.6 3.3*  CL 101 102  CO2 28 29  BUN 15 12  CREATININE 0.90 0.86   Liver Panel No results for input(s): "PROT", "ALBUMIN", "AST", "ALT", "ALKPHOS", "BILITOT", "BILIDIR", "IBILI" in the last 72 hours. Sedimentation Rate No results for input(s): "ESRSEDRATE" in the last 72 hours. C-Reactive Protein No results for input(s): "CRP" in the last 72 hours.  Microbiology:  Studies/Results: No results found.   Assessment/Plan: ***

## 2021-11-03 NOTE — Progress Notes (Signed)
Daily Progress Note   Patient Name: Jill Rodriguez       Date: 11/03/2021 DOB: [birth year]  Age: 61 y.o. MRN#: [id number] Attending Physician: Jill Boroughs, MD Primary Care Physician: Pcp, No Admit Date: 10/28/2021  Reason for Consultation/Follow-up: Establishing goals of care  Subjective: Notes reviewed. In to see patient. No family at bedside. Tele-interpretor [phone] used. Patient is very difficult to hear. She initially states she does not feel any better than she has been.  She coughs and spits intermittently. She answers questions with very short responses. She indicates her husband brings her food and drinks.  She indicates she would want any and all care needed with limit of a feeding tube if one were ever indicated  Length of Stay: 6  Current Medications: Scheduled Meds:   feeding supplement  237 mL Oral TID BM   LORazepam  0.5 mg Oral TID   multivitamin with minerals  1 tablet Oral Daily   mouth rinse  15 mL Mouth Rinse 4 times per day   scopolamine  1 patch Transdermal Q72H    Continuous Infusions:  fluconazole (DIFLUCAN) IV 400 mg (11/02/21 1632)   promethazine (PHENERGAN) injection (IM or IVPB) 12.5 mg (11/03/21 1202)    PRN Meds: acetaminophen **OR** acetaminophen, guaiFENesin-dextromethorphan, morphine injection, ondansetron **OR** ondansetron (ZOFRAN) IV, mouth rinse, promethazine (PHENERGAN) injection (IM or IVPB)  Physical Exam Pulmonary:     Effort: Pulmonary effort is normal.  Neurological:     Mental Status: She is alert.             Vital Signs: BP 112/74 (BP Location: Right Arm)   Pulse (!) 104   Temp 98.4 F (36.9 C) (Oral)   Resp 18   Ht '5\' 4"'$  (1.626 m)   Wt 73 kg   SpO2 97%   BMI 27.62 kg/m  SpO2: SpO2: 97 % O2 Device: O2 Device: Room  Air O2 Flow Rate: O2 Flow Rate (L/min): 2 L/min  Intake/output summary:  Intake/Output Summary (Last 24 hours) at 11/03/2021 1613 Last data filed at 11/03/2021 1400 Gross per 24 hour  Intake --  Output 1750 ml  Net -1750 ml   LBM: Last BM Date : 11/01/21 Baseline Weight: Weight: 77.1 kg Most recent weight: Weight: 73 kg     Patient Active Problem List   Diagnosis  Date Noted   Pleural effusion on left 10/31/2021   S/P thoracentesis    Vocal cord paralysis 10/30/2021   Candidemia ([place]) 10/29/2021   Abnormal EKG 10/29/2021   Abnormal CT of the head 10/29/2021   Neutropenic fever ([place]) 10/28/2021   Pyuria 10/28/2021   AKI (acute kidney injury) ([place]) 10/28/2021   Pancytopenia due to antineoplastic chemotherapy ([place]) 10/28/2021   HCAP (healthcare-associated pneumonia) 10/28/2021   Cancer with pulmonary metastases ([place]) 08/27/2021   Chronic cough 07/25/2021   Dysuria 07/31/2020   Chemotherapy induced neutropenia ([place]) 06/14/2020   Hypocalcemia 06/14/2020   Normocytic anemia 06/14/2020   Thoracic lymphadenopathy 05/20/2020   Chemotherapy induced nausea and vomiting 10/31/2019   Heartburn 10/17/2019   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 ([place]) 10/10/2019   Encounter for antineoplastic chemotherapy 10/10/2019   Non-intractable vomiting 10/10/2019   Thrush 10/10/2019   Goals of care, counseling/discussion 09/20/2019   Malignant neoplasm of cervix ([place]) 08/30/2019    Palliative Care Assessment & Plan    Recommendations/Plan: Full code full scope with limit of a feeding tube if one were ever indicated. .  Code Status:    Code Status Orders  (From admission, onward)           Start     Ordered   10/28/21 2221  Full code  Continuous        10/28/21 2224           Code Status History     This patient has a current code status but no historical code status.       Thank you for allowing the Palliative Medicine Team to assist in the care of this  patient.  Jill Gowda, NP  Please contact Palliative Medicine Team phone at [phone] for questions and concerns.

## 2021-11-03 NOTE — Progress Notes (Signed)
Progress Note    Jill Rodriguez  HYQ:[id number] DOB: 1960/07/15  DOA: 10/28/2021 PCP: Pcp, No      Brief Narrative:    Medical records reviewed and are as summarized below:   Jill Rodriguez is a 61 y.o. 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 coli UTI sensitive only to ertapenem and nitrofurantoin, with last chemotherapy infusion on 6/23 who presents to the ED with intractable nausea and vomiting that started 2 days prior.   During her last chemotherapy session she developed acute nausea and vomiting that was treated with Compazine.  Her nausea and vomiting was managed for the first few days with dexamethasone, Marinol, Zofran, Phenergan suppository and scopolamine patch, however 2 days ago she developed intractable vomiting associated with weakness.  According to her husband at the bedside, patient has been having bouts of coughing for the past several weeks and now every time she has a bout of coughing she vomits.  He feels like this happens every 4-6 hours over the past two days.  She has not had shortness of breath, chest pain or fever.  She's been admitted with nausea/vomiting, neutropenia.  She's now been shown to have candidemia.  Currently on broad spectrum antiinfectives.  ID and oncology consulted.  She has markedly abnormal EKG, cardiology c/s pending.   See below for additional details      Assessment/Plan:   Principal Problem:   Chemotherapy induced nausea and vomiting Active Problems:   Neutropenic fever (HCC)   Candidemia (HCC)   Pyuria   HCAP (healthcare-associated pneumonia)   Pancytopenia due to antineoplastic chemotherapy (HCC)   Abnormal EKG   AKI (acute kidney injury)  (HCC)   Abnormal CT of the head   Chemotherapy induced neutropenia (HCC)   Vocal cord paralysis   Pleural effusion on left   S/P thoracentesis   Nutrition Problem: Inadequate oral intake Etiology: poor appetite  Signs/Symptoms: per patient/family report   Body mass index is 27.62 kg/m.   Candidemia with Candida albicans, neutropenic fever: Port-A-Cath tip culture and blood culture showed Candida albicans.  No growth on repeat blood cultures.  Right upper chest Port-A-Cath was removed by IR on 10/30/2021.  Cardiologist is concerned about performing TEE because of esophageal dysmotility with air-fluid levels on CT chest.  ID recommended 4 to 6 weeks of IV fluconazole at discharge.  Large left pleural effusion on CT chest: S/p left-sided thoracentesis with removal of 500 mL of yellowish fluid on 10/31/2021.  Cytology is pending.  Vocal cord paralysis diagnosed by otolaryngologist: CT neck also showed possible left vocal cord weakness or paralysis.  Speech therapist recommended regular diet.    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  CT chest showed interval improvement in the pulmonary nodules.  Follow-up with oncologist.  Chemotherapy-induced pancytopenia: All counts are trending down.  Monitor CBC.  Nausea and vomiting: Improved.  Antiemetics as needed.  Hypokalemia and hypomagnesemia: Replete potassium and magnesium and monitor levels.  Pyuria, multidrug-resistant E. coli in urine suspected to be due to colonization:  Abnormal head CT: No Chiari I malformation was identified on MRI brain.  Abnormal EKG (T wave inversions in leads I and aVL, prolonged QTc-460), elevated  troponins: Repeat EKG on 10/31/2021 still shows prolonged QTc-494, T wave inversions in leads I and aVL.  No chest pain. Mildly elevated troponins likely from demand ischemia.    AKI, hypophosphatemia: Improved    Diet Order              Diet regular Room service appropriate? Yes; Fluid consistency: Thin  Diet effective now                        Consultants: Cardiologist, otolaryngologist, interventional radiologist, oncologist, infectious disease  Procedures: Port-A-Cath removal on 10/30/2021    Medications:    feeding supplement  237 mL Oral TID BM   LORazepam  0.5 mg Oral TID   multivitamin with minerals  1 tablet Oral Daily   mouth rinse  15 mL Mouth Rinse 4 times per day   scopolamine  1 patch Transdermal Q72H   Continuous Infusions:  fluconazole (DIFLUCAN) IV 400 mg (11/02/21 1632)   promethazine (PHENERGAN) injection (IM or IVPB) 12.5 mg (11/03/21 1202)     Anti-infectives (From admission, onward)    Start     Dose/Rate Route Frequency Ordered Stop   10/30/21 1600  fluconazole (DIFLUCAN) IVPB 400 mg        400 mg 100 mL/hr over 120 Minutes Intravenous Every 24 hours 10/29/21 1250     10/29/21 2300  vancomycin (VANCOREADY) IVPB 750 mg/150 mL  Status:  Discontinued        750 mg 150 mL/hr over 60 Minutes Intravenous Every 24 hours 10/28/21 2234 10/29/21 1028   10/29/21 1630  fluconazole (DIFLUCAN) IVPB 400 mg       See Hyperspace for full Linked Orders Report.   400 mg 100 mL/hr over 120 Minutes Intravenous  Once 10/29/21 1250 10/29/21 1858   10/29/21 1430  fluconazole (DIFLUCAN) IVPB 400 mg       See Hyperspace for full Linked Orders Report.   400 mg 100 mL/hr over 120 Minutes Intravenous  Once 10/29/21 1250 10/29/21 1607   10/29/21 1315  micafungin (MYCAMINE) 100 mg in sodium chloride 0.9 % 100 mL IVPB  Status:  Discontinued        100 mg 105 mL/hr over 1 Hours Intravenous Every 24 hours 10/29/21 1220 10/29/21 1250   10/29/21 1200  vancomycin (VANCOREADY) IVPB 750 mg/150 mL  Status:  Discontinued        750 mg 150 mL/hr over 60 Minutes Intravenous Every 12 hours 10/29/21 1028 10/29/21 1428   10/28/21 2245  meropenem (MERREM) 1 g in sodium chloride 0.9 % 100 mL IVPB   Status:  Discontinued        1 g 200 mL/hr over 30 Minutes Intravenous Every 8 hours 10/28/21 2231 10/29/21 1953   10/28/21 2245  vancomycin (VANCOREADY) IVPB 1750 mg/350 mL        1,750 mg 175 mL/hr over 120 Minutes Intravenous  Once 10/28/21 2231 10/29/21 0119   10/28/21 2015  ceFEPIme (MAXIPIME) 2 g in sodium chloride 0.9 % 100 mL IVPB        2 g 200 mL/hr over 30 Minutes Intravenous  Once 10/28/21 2009 10/28/21 2151   10/28/21 1845  cefTRIAXone (ROCEPHIN) 1 g in sodium chloride 0.9 % 100 mL IVPB  Status:  Discontinued        1 g 200 mL/hr over 30 Minutes Intravenous  Once 10/28/21 1838 10/28/21 1839              Family Communication/Anticipated D/C  date and plan/Code Status   DVT prophylaxis: SCDs Start: 10/28/21 2223     Code Status: Full Code  Family Communication: Plan discussed with her husband at the bedside Disposition Plan: Plan to discharge home in 1 to 2 days   Status is: Inpatient Remains inpatient appropriate because: Candidemia on IV fluconazole       Subjective:   Interval events noted.  She does not provide much history.  She still has a cough.  No nausea or vomiting.  Her husband was at the bedside  Objective:    Vitals:   11/02/21 1707 11/02/21 2054 11/03/21 0636 11/03/21 0832  BP: 130/77 140/77 137/87 112/74  Pulse: 95 97 99 (!) 104  Resp: '16 18 17 18  '$ Temp: 99.2 F (37.3 C) 99.6 F (37.6 C) 99 F (37.2 C) 98.4 F (36.9 C)  TempSrc:  Oral Oral Oral  SpO2: 96% 95% 90% 97%  Weight:      Height:       No data found.   Intake/Output Summary (Last 24 hours) at 11/03/2021 1610 Last data filed at 11/03/2021 1400 Gross per 24 hour  Intake --  Output 1750 ml  Net -1750 ml   Filed Weights   10/28/21 1529 10/29/21 0159  Weight: 77.1 kg 73 kg    Exam:  GEN: NAD SKIN: Warm and dry EYES: No pallor or icterus ENT: MMM CV: RRR PULM: CTA B ABD: soft, obese, NT, +BS CNS: AAO x 3, non focal EXT: No edema or tenderness         Data Reviewed:   I have personally reviewed following labs and imaging studies:  Labs: Labs show the following:   Basic Metabolic Panel: Recent Labs  Lab 10/29/21 0554 10/30/21 0514 10/31/21 0349 11/01/21 0416 11/03/21 0500  NA 137 134* 136 135 135  K 3.4* 3.3* 3.8 3.6 3.3*  CL 104 102 102 101 102  CO2 '28 26 27 28 29  '$ GLUCOSE 90 90 77 86 120*  BUN '18 14 14 15 12  '$ CREATININE 0.85 0.85 0.94 0.90 0.86  CALCIUM 8.4* 8.4* 8.3* 8.3* 8.0*  MG  --  1.4* 1.7 2.1 1.4*  PHOS  --  2.6 2.3* 3.7 3.0   GFR Estimated Creatinine Clearance: 67.2 mL/min (by C-G formula based on SCr of 0.86 mg/dL). Liver Function Tests: Recent Labs  Lab 10/28/21 1531 10/30/21 0514  AST 30 24  ALT 42 30  ALKPHOS 68 64  BILITOT 0.8 1.0  PROT 6.4* 6.1*  ALBUMIN 3.3* 2.9*   Recent Labs  Lab 10/28/21 1531  LIPASE 23   No results for input(s): "AMMONIA" in the last 168 hours. Coagulation profile No results for input(s): "INR", "PROTIME" in the last 168 hours.  CBC: Recent Labs  Lab 10/29/21 0554 10/30/21 0514 10/30/21 1758 11/01/21 0416 11/03/21 0500  WBC 1.0*  1.0* 2.5* 3.8* 4.7 2.8*  NEUTROABS 0.2* 1.8 3.0 3.4 1.6*  HGB 7.8*  7.9* 8.1* 7.9* 7.9* 7.7*  HCT 23.3*  23.6* 24.0* 23.4* 24.0* 23.0*  MCV 92.8  93.3 91.6 91.1 93.0 93.5  PLT 50*  47* 43* 39* 37* 33*   Cardiac Enzymes: No results for input(s): "CKTOTAL", "CKMB", "CKMBINDEX", "TROPONINI" in the last 168 hours. BNP (last 3 results) No results for input(s): "PROBNP" in the last 8760 hours. CBG: No results for input(s): "GLUCAP" in the last 168 hours. D-Dimer: No results for input(s): "DDIMER" in the last 72 hours. Hgb A1c: No results for input(s): "HGBA1C" in the last 72  hours. Lipid Profile: No results for input(s): "CHOL", "HDL", "LDLCALC", "TRIG", "CHOLHDL", "LDLDIRECT" in the last 72 hours. Thyroid function studies: No results for input(s): "TSH", "T4TOTAL", "T3FREE", "THYROIDAB" in the last 72 hours.  Invalid  input(s): "FREET3" Anemia work up: Recent Labs    11/03/21 0500  FERRITIN 900*  TIBC 134*  IRON 62   Sepsis Labs: Recent Labs  Lab 10/28/21 1531 10/28/21 1851 10/28/21 2025 10/29/21 0554 10/30/21 0514 10/30/21 1758 11/01/21 0416 11/03/21 0500  PROCALCITON <0.10  --   --   --   --   --   --   --   WBC 1.0*  1.0*  --   --    < > 2.5* 3.8* 4.7 2.8*  LATICACIDVEN  --  1.3 0.7  --   --   --   --   --    < > = values in this interval not displayed.    Microbiology Recent Results (from the past 240 hour(s))  Urine Culture     Status: Abnormal   Collection Time: 10/28/21  3:30 PM   Specimen: Urine, Clean Catch  Result Value Ref Range Status   Specimen Description   Final    URINE, CLEAN CATCH Performed at [facility], [address] [place], [place] [address]    Special Requests   Final    NONE Performed at [facility], [address] [place], [place] [address]    Culture (A)  Final    >=100,000 COLONIES/mL ESCHERICHIA COLI Confirmed Extended Spectrum Beta-Lactamase Producer (ESBL).  In bloodstream infections from ESBL organisms, carbapenems are preferred over piperacillin/tazobactam. They are shown to have a lower risk of mortality.    Report Status 10/31/2021 FINAL  Final   Organism ID, Bacteria ESCHERICHIA COLI (A)  Final      Susceptibility   Escherichia coli - MIC*    AMPICILLIN >=32 RESISTANT Resistant     CEFAZOLIN >=64 RESISTANT Resistant     CEFEPIME 16 RESISTANT Resistant     CEFTRIAXONE >=64 RESISTANT Resistant     CIPROFLOXACIN 2 RESISTANT Resistant     GENTAMICIN >=16 RESISTANT Resistant     IMIPENEM <=0.25 SENSITIVE Sensitive     NITROFURANTOIN 32 SENSITIVE Sensitive     TRIMETH/SULFA >=320 RESISTANT Resistant     AMPICILLIN/SULBACTAM >=32 RESISTANT Resistant     PIP/TAZO <=4 SENSITIVE Sensitive     * >=100,000 COLONIES/mL ESCHERICHIA COLI  SARS Coronavirus 2 by RT PCR (hospital order, performed in [place] hospital lab)  *cepheid single result test* Anterior Nasal Swab     Status: None   Collection Time: 10/28/21  6:51 PM   Specimen: Anterior Nasal Swab  Result Value Ref Range Status   SARS Coronavirus 2 by RT PCR NEGATIVE NEGATIVE Final    Comment: (NOTE) SARS-CoV-2 target nucleic acids are NOT DETECTED.  The SARS-CoV-2 RNA is generally detectable in upper and lower respiratory specimens during the acute phase of infection. The lowest concentration of SARS-CoV-2 viral copies this assay can detect is 250 copies / mL. A negative result does not preclude SARS-CoV-2 infection and should not be used as the sole basis for treatment or other patient management decisions.  A negative result may occur with improper specimen collection / handling, submission of specimen other than nasopharyngeal swab, presence of viral mutation(s) within the areas targeted by this assay, and inadequate number of viral copies (<250 copies / mL). A negative result must be combined with clinical observations, patient history, and epidemiological  information.  Fact Sheet for Patients:   https://www.patel.info/  Fact Sheet for Healthcare Providers: https://hall.com/  This test is not yet approved or  cleared by the [place] FDA and has been authorized for detection and/or diagnosis of SARS-CoV-2 by FDA under an Emergency Use Authorization (EUA).  This EUA will remain in effect (meaning this test can be used) for the duration of the COVID-19 declaration under Section 564(b)(1) of the Act, 21 U.S.C. section 360bbb-3(b)(1), unless the authorization is terminated or revoked sooner.  Performed at [facility], [address] [place], [place] [address]   Blood culture (routine x 2)     Status: Abnormal (Preliminary result)   Collection Time: 10/28/21  8:25 PM   Specimen: BLOOD  Result Value Ref Range Status   Specimen Description   Final    BLOOD LEFT ASSIST  CONTROL Performed at [facility], [address] [place], [place] [address]    Special Requests   Final    BOTTLES DRAWN AEROBIC AND ANAEROBIC Blood Culture adequate volume Performed at [facility], [address] [place], [place] [address]    Culture  Setup Time   Final    YEAST IN BOTH AEROBIC AND ANAEROBIC BOTTLES CRITICAL VALUE NOTED.  VALUE IS CONSISTENT WITH PREVIOUSLY REPORTED AND CALLED VALUE. Performed at [facility], [address] [place], [place] [address]    Culture CANDIDA ALBICANS (A)  Final   Report Status PENDING  Incomplete  Blood culture (routine x 2)     Status: Abnormal (Preliminary result)   Collection Time: 10/28/21  8:25 PM   Specimen: BLOOD  Result Value Ref Range Status   Specimen Description   Final    BLOOD PORTA CATH Performed at [facility], [address] [place], [place] [address]    Special Requests   Final    BOTTLES DRAWN AEROBIC AND ANAEROBIC Blood Culture adequate volume Performed at [facility], [address] [place], [place] [address]    Culture  Setup Time   Final    Organism ID to follow IN BOTH AEROBIC AND ANAEROBIC BOTTLES YEAST CRITICAL RESULT CALLED TO, READ BACK BY AND VERIFIED WITH: Carey Bullocks 10/29/2021 at 1137 SRR Performed at [facility], [address] [place], [place] [address]    Culture (A)  Final    CANDIDA ALBICANS Sent to [place] for further susceptibility testing. Performed at [facility], [place] [address] [place], [place] [address]    Report Status PENDING  Incomplete  Blood Culture ID Panel (Reflexed)     Status: Abnormal   Collection Time: 10/28/21  8:25 PM  Result Value Ref Range Status   Enterococcus faecalis NOT DETECTED NOT DETECTED Final   Enterococcus Faecium NOT DETECTED NOT DETECTED Final   Listeria monocytogenes NOT DETECTED NOT DETECTED Final   Staphylococcus species NOT DETECTED NOT DETECTED Final   Staphylococcus  aureus (BCID) NOT DETECTED NOT DETECTED Final   Staphylococcus epidermidis NOT DETECTED NOT DETECTED Final   Staphylococcus lugdunensis NOT DETECTED NOT DETECTED Final   Streptococcus species NOT DETECTED NOT DETECTED Final   Streptococcus agalactiae NOT DETECTED NOT DETECTED Final   Streptococcus pneumoniae NOT DETECTED NOT DETECTED Final   Streptococcus pyogenes NOT DETECTED NOT DETECTED Final   A.calcoaceticus-baumannii NOT DETECTED NOT DETECTED Final   Bacteroides fragilis NOT DETECTED NOT DETECTED Final   Enterobacterales NOT DETECTED NOT DETECTED Final   Enterobacter cloacae complex NOT DETECTED NOT DETECTED Final   Escherichia coli  NOT DETECTED NOT DETECTED Final   Klebsiella aerogenes NOT DETECTED NOT DETECTED Final   Klebsiella oxytoca NOT DETECTED NOT DETECTED Final   Klebsiella pneumoniae NOT DETECTED NOT DETECTED Final   Proteus species NOT DETECTED NOT DETECTED Final   Salmonella species NOT DETECTED NOT DETECTED Final   Serratia marcescens NOT DETECTED NOT DETECTED Final   Haemophilus influenzae NOT DETECTED NOT DETECTED Final   Neisseria meningitidis NOT DETECTED NOT DETECTED Final   Pseudomonas aeruginosa NOT DETECTED NOT DETECTED Final   Stenotrophomonas maltophilia NOT DETECTED NOT DETECTED Final   Candida albicans DETECTED (A) NOT DETECTED Final    Comment: CRITICAL RESULT CALLED TO, READ BACK BY AND VERIFIED WITH: Carey Bullocks 10/29/2021 at 1137 SRR    Candida auris NOT DETECTED NOT DETECTED Final   Candida glabrata NOT DETECTED NOT DETECTED Final   Candida krusei NOT DETECTED NOT DETECTED Final   Candida parapsilosis NOT DETECTED NOT DETECTED Final   Candida tropicalis NOT DETECTED NOT DETECTED Final   Cryptococcus neoformans/gattii NOT DETECTED NOT DETECTED Final    Comment: Performed at [facility], [address] [place], [place] [address]  Antifungal AST 9 Drug Panel     Status: None (Preliminary result)   Collection Time: 10/28/21  8:25 PM   Result Value Ref Range Status   Organism ID, Yeast Preliminary report  Final    Comment: (NOTE) Specimen has been received and testing has been initiated. Performed At: [facility] [place], [place] [id number] Rush Farmer MD WC:[id number]    Amphotericin B MIC PENDING  Incomplete   Anidulafungin MIC PENDING  Incomplete   Caspofungin MIC PENDING  Incomplete   Micafungin MIC PENDING  Incomplete   Posaconazole MIC PENDING  Incomplete   Fluconazole Islt MIC PENDING  Incomplete   Flucytosine MIC PENDING  Incomplete   Itraconazole MIC PENDING  Incomplete   Voriconazole MIC PENDING  Incomplete   Source BLOOD  Final    Comment: Performed at [facility], [place]. [address] [place], [place] [address]  MRSA Next Gen by PCR, Nasal     Status: None   Collection Time: 10/29/21 12:11 PM   Specimen: Nasal Mucosa; Nasal Swab  Result Value Ref Range Status   MRSA by PCR Next Gen NOT DETECTED NOT DETECTED Final    Comment: (NOTE) The GeneXpert MRSA Assay (FDA approved for NASAL specimens only), is one component of a comprehensive MRSA colonization surveillance program. It is not intended to diagnose MRSA infection nor to guide or monitor treatment for MRSA infections. Test performance is not FDA approved in patients less than 66 years old. Performed at [facility], [address] [place], [place] [address]   Culture, blood (Routine X 2) w Reflex to ID Panel     Status: None   Collection Time: 10/29/21  2:41 PM   Specimen: BLOOD  Result Value Ref Range Status   Specimen Description BLOOD RIGHT ANTECUBITAL  Final   Special Requests   Final    BOTTLES DRAWN AEROBIC AND ANAEROBIC Blood Culture adequate volume   Culture   Final    NO GROWTH 5 DAYS Performed at [facility], [address], [place], [place] [address]    Report Status 11/03/2021 FINAL  Final  Culture, blood (Routine X 2) w Reflex to ID Panel     Status: None   Collection Time:  10/29/21  3:00 PM   Specimen: BLOOD  Result Value Ref Range Status   Specimen  Description BLOOD LEFT ANTECUBITAL  Final   Special Requests   Final    BOTTLES DRAWN AEROBIC AND ANAEROBIC Blood Culture adequate volume   Culture   Final    NO GROWTH 5 DAYS Performed at [facility], [address] [place], [place] [address]    Report Status 11/03/2021 FINAL  Final  Culture, blood (Routine X 2) w Reflex to ID Panel     Status: None (Preliminary result)   Collection Time: 10/30/21  5:14 AM   Specimen: BLOOD  Result Value Ref Range Status   Specimen Description BLOOD LEFT [facility]  Final   Special Requests   Final    BOTTLES DRAWN AEROBIC AND ANAEROBIC Blood Culture adequate volume   Culture   Final    NO GROWTH 4 DAYS Performed at [facility], [address] [place], [place] [address]    Report Status PENDING  Incomplete  Culture, blood (Routine X 2) w Reflex to ID Panel     Status: None (Preliminary result)   Collection Time: 10/30/21  5:15 AM   Specimen: BLOOD  Result Value Ref Range Status   Specimen Description BLOOD LEFT HAND  Final   Special Requests   Final    BOTTLES DRAWN AEROBIC AND ANAEROBIC Blood Culture adequate volume   Culture   Final    NO GROWTH 4 DAYS Performed at [facility], [address] [place], [place] [address]    Report Status PENDING  Incomplete  Cath Tip Culture     Status: Abnormal   Collection Time: 10/30/21  3:51 PM   Specimen: Catheter Tip  Result Value Ref Range Status   Specimen Description CATH TIP  Final   Special Requests   Final    CHEST PORT CATH TIP Performed at [facility], [place] [address], [place], [place] [address]    Culture >=100,000 COLONIES/mL CANDIDA ALBICANS (A)  Final   Report Status 11/02/2021 FINAL  Final  Body fluid culture w Gram Stain     Status: None (Preliminary result)   Collection Time: 10/31/21 12:25 PM   Specimen: PATH Cytology Pleural fluid  Result Value Ref Range Status    Specimen Description   Final    PLEURAL Performed at [facility], [address], [place], [place] [address]    Special Requests   Final    NONE Performed at [facility], [address], [place], [place] [address]    Gram Stain NO WBC SEEN NO ORGANISMS SEEN   Final   Culture   Final    NO GROWTH 3 DAYS Performed at [facility], [place] [address] [place], [place] [address]    Report Status PENDING  Incomplete    Procedures and diagnostic studies:  No results found.             LOS: 6 days   Safiyya Stokes  [facility]   Pager on [email or web address]. If 7PM-7AM, please contact night-coverage at [email or web address]     11/03/2021, 4:10 PM

## 2021-11-04 DIAGNOSIS — R112 Nausea with vomiting, unspecified: Secondary | ICD-10-CM | POA: Diagnosis not present

## 2021-11-04 DIAGNOSIS — B377 Candidal sepsis: Secondary | ICD-10-CM | POA: Diagnosis not present

## 2021-11-04 DIAGNOSIS — T451X5A Adverse effect of antineoplastic and immunosuppressive drugs, initial encounter: Secondary | ICD-10-CM | POA: Diagnosis not present

## 2021-11-04 LAB — MAGNESIUM: Magnesium: 2 mg/dL (ref 1.7–2.4)

## 2021-11-04 LAB — CULTURE, BLOOD (ROUTINE X 2)
Culture: NO GROWTH
Culture: NO GROWTH
Special Requests: ADEQUATE
Special Requests: ADEQUATE

## 2021-11-04 LAB — CBC
HCT: 23.2 % — ABNORMAL LOW (ref 36.0–46.0)
Hemoglobin: 7.5 g/dL — ABNORMAL LOW (ref 12.0–15.0)
MCH: 30.6 pg (ref 26.0–34.0)
MCHC: 32.3 g/dL (ref 30.0–36.0)
MCV: 94.7 fL (ref 80.0–100.0)
Platelets: 36 10*3/uL — ABNORMAL LOW (ref 150–400)
RBC: 2.45 MIL/uL — ABNORMAL LOW (ref 3.87–5.11)
RDW: 15.7 % — ABNORMAL HIGH (ref 11.5–15.5)
WBC: 2.5 10*3/uL — ABNORMAL LOW (ref 4.0–10.5)
nRBC: 0 % (ref 0.0–0.2)

## 2021-11-04 LAB — HEPATIC FUNCTION PANEL
ALT: 23 U/L (ref 0–44)
AST: 28 U/L (ref 15–41)
Albumin: 2.6 g/dL — ABNORMAL LOW (ref 3.5–5.0)
Alkaline Phosphatase: 80 U/L (ref 38–126)
Bilirubin, Direct: 0.1 mg/dL (ref 0.0–0.2)
Indirect Bilirubin: 0.6 mg/dL (ref 0.3–0.9)
Total Bilirubin: 0.7 mg/dL (ref 0.3–1.2)
Total Protein: 5.4 g/dL — ABNORMAL LOW (ref 6.5–8.1)

## 2021-11-04 LAB — BASIC METABOLIC PANEL
Anion gap: 5 (ref 5–15)
BUN: 11 mg/dL (ref 8–23)
CO2: 29 mmol/L (ref 22–32)
Calcium: 8.2 mg/dL — ABNORMAL LOW (ref 8.9–10.3)
Chloride: 101 mmol/L (ref 98–111)
Creatinine, Ser: 0.91 mg/dL (ref 0.44–1.00)
GFR, Estimated: >60 mL/min (ref >60)
Glucose, Bld: 98 mg/dL (ref 70–99)
Potassium: 3.4 mmol/L — ABNORMAL LOW (ref 3.5–5.1)
Sodium: 135 mmol/L (ref 135–145)

## 2021-11-04 LAB — BODY FLUID CULTURE W GRAM STAIN
Culture: NO GROWTH
Gram Stain: NONE SEEN

## 2021-11-04 LAB — CYTOLOGY - NON PAP

## 2021-11-04 MED ORDER — POTASSIUM CHLORIDE CRYS ER 20 MEQ PO TBCR
40.0000 meq | EXTENDED_RELEASE_TABLET | Freq: Once | ORAL | Status: AC
Start: 2021-11-04 — End: 2021-11-04
  Administered 2021-11-04: 40 meq via ORAL
  Filled 2021-11-04: qty 2

## 2021-11-04 NOTE — Evaluation (Signed)
Occupational Therapy Evaluation Patient Details Name: Jill Rodriguez MRN: [id number] DOB: 25-Feb-1961 Today's Date: 11/04/2021   History of Present Illness Pt is a 61 y.o. 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 coli UTI, presents to the ED with intractable nausea and vomiting that started 2 days prior.   Clinical Impression   Patient presenting with decreased Ind in self care, balance, functional mobility/transfers, endurance, and safety awareness. Use of video interpreter this session with husband present. Pt is soft spoken and husband answers most of questions. Pt reports feeling nauseated and actively vomiting prior to therapist arrival. Pt needing min A to change hospital gown while supine and pt washes face with set up A. Pt declined OOB activity secondary to fatigue and feeling unwell. Max A for repositioning in bed for comfort. Pt's husband has been assisting her with self care needs at home. OT does recommend BSC for energy conservation at home. Patient will benefit from acute OT to increase overall independence in the areas of ADLs, functional mobility, and safety awareness in order to safely discharge home with family.      Recommendations for follow up therapy are one component of a multi-disciplinary discharge planning process, led by the attending physician.  Recommendations may be updated based on patient status, additional functional criteria and insurance authorization.   Follow Up Recommendations  Home health OT    Assistance Recommended at Discharge Frequent or constant Supervision/Assistance  Patient can return home with the following A lot of help with bathing/dressing/bathroom;A little help with  walking and/or transfers;Assistance with cooking/housework;Help with stairs or ramp for entrance;Assist for transportation;Direct supervision/assist for financial management;Direct supervision/assist for medications management    Functional Status Assessment  Patient has had a recent decline in their functional status and demonstrates the ability to make significant improvements in function in a reasonable and predictable amount of time.  Equipment Recommendations  BSC/3in1       Precautions / Restrictions Precautions Precautions: Fall Restrictions Weight Bearing Restrictions: No      Mobility Bed Mobility Overal bed mobility: Needs Assistance             General bed mobility comments: max A for bed mobility and repositioning    Transfers                   General transfer comment: Pt declined          ADL either performed or assessed with clinical judgement   ADL Overall ADL's : Needs assistance/impaired     Grooming: Wash/dry hands;Wash/dry face;Bed level;Supervision/safety;Set up                                 General ADL Comments: session limited secondary to nausea and weakness and pt just generally feeling unwell     Vision Patient Visual Report: No change from baseline              Pertinent Vitals/Pain Pain Assessment Pain Assessment: Faces Faces Pain Scale: Hurts little more Pain Location: stomach Pain Descriptors / Indicators: Discomfort Pain Intervention(s): Patient requesting pain meds-RN notified, Repositioned, Monitored during session     Hand Dominance Right   Extremity/Trunk Assessment Upper Extremity Assessment  Upper Extremity Assessment: Generalized weakness   Lower Extremity Assessment Lower Extremity Assessment: Generalized weakness       Communication Communication Communication: No difficulties   Cognition Arousal/Alertness: Awake/alert Behavior During Therapy: WFL for tasks  assessed/performed Overall Cognitive Status: Within Functional Limits for tasks assessed                                                  Home Living Family/patient expects to be discharged to:: Private residence Living Arrangements: Spouse/significant other Available Help at Discharge: Available 24 hours/day Type of Home: Mobile home Home Access: Stairs to enter [employer] of Steps: 5 Entrance Stairs-Rails: Right;Left;Can reach both Home Layout: One level     Bathroom Shower/Tub: Teacher, early years/pre: Handicapped height     Home Equipment: Conservation officer, nature (2 wheels)          Prior Functioning/Environment Prior Level of Function : Needs assist             Mobility Comments: transfers and short distances ADLs Comments: Pt's spouse reports he has been assisting her with ADLs        OT Problem List: Decreased strength;Decreased activity tolerance;Decreased safety awareness;Impaired balance (sitting and/or standing);Decreased knowledge of use of DME or AE;Decreased knowledge of precautions      OT Treatment/Interventions: Self-care/ADL training;Therapeutic exercise;Therapeutic activities;Energy conservation;Patient/family education;Balance training    OT Goals(Current goals can be found in the care plan section) Acute Rehab OT Goals Patient Stated Goal: to go home and feel better OT Goal Formulation: With patient/family Time For Goal Achievement: 11/18/21 Potential to Achieve Goals: Fair ADL Goals Pt Will Perform Grooming: with supervision;sitting Pt Will Perform Lower Body Dressing: sit to/from stand;with min guard assist Pt Will Transfer to Toilet: with min guard assist;ambulating;bedside commode Pt Will Perform Toileting - Clothing Manipulation and hygiene: with min guard assist;sit to/from stand  OT Frequency: Min 2X/week       AM-PAC OT "6 Clicks" Daily Activity     Outcome Measure Help from another person eating  meals?: A Little Help from another person taking care of personal grooming?: A Little Help from another person toileting, which includes using toliet, bedpan, or urinal?: A Lot Help from another person bathing (including washing, rinsing, drying)?: A Lot Help from another person to put on and taking off regular upper body clothing?: A Little Help from another person to put on and taking off regular lower body clothing?: A Lot 6 Click Score: 15   End of Session Nurse Communication: Mobility status  Activity Tolerance: Patient limited by fatigue Patient left: in bed;with call bell/phone within reach;with bed alarm set;with family/visitor present  OT Visit Diagnosis: Unsteadiness on feet (R26.81);Muscle weakness (generalized) (M62.81)                Time: 2703-5009 OT Time Calculation (min): 18 min Charges:  OT General Charges $OT Visit: 1 Visit OT Evaluation $OT Eval Moderate Complexity: 1 Mod OT Treatments $Therapeutic Activity: 8-22 mins Darleen Crocker, MS, OTR/L , CBIS ascom [phone]  11/04/21, 1:28 PM

## 2021-11-04 NOTE — Progress Notes (Addendum)
Progress Note    Jill Rodriguez  XHB:[id number] DOB: 1960-07-13  DOA: 10/28/2021 PCP: Pcp, No      Brief Narrative:    Medical records reviewed and are as summarized below:   Jill Rodriguez is a 61 y.o. 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 coli UTI sensitive only to ertapenem and nitrofurantoin, with last chemotherapy infusion on 6/23 who presents to the ED with intractable nausea and vomiting that started 2 days prior.   During her last chemotherapy session she developed acute nausea and vomiting that was treated with Compazine.  Her nausea and vomiting was managed for the first few days with dexamethasone, Marinol, Zofran, Phenergan suppository and scopolamine patch, however 2 days ago she developed intractable vomiting associated with weakness.  According to her husband at the bedside, patient has been having bouts of coughing for the past several weeks and now every time she has a bout of coughing she vomits.  He feels like this happens every 4-6 hours over the past two days.  She has not had shortness of breath, chest pain or fever.  She's been admitted with nausea/vomiting, neutropenia.  She's now been shown to have candidemia.  She is on IV fluconazole.  She was found to have vocal cord paralysis and esophageal dysmotility with air-fluid levels.  TEE was recommended but cardiologist refused TEE may be unsafe because of esophageal dysmotility.  She underwent left-sided thoracentesis for large pleural effusion with removal of 500 cc of pleural fluid.  Pleural fluid cytology is pending.      Assessment/Plan:   Principal Problem:   Chemotherapy induced nausea and vomiting Active Problems:    Neutropenic fever (HCC)   Candidemia (HCC)   Pyuria   HCAP (healthcare-associated pneumonia)   Pancytopenia due to antineoplastic chemotherapy (HCC)   Abnormal EKG   AKI (acute kidney injury) (HCC)   Abnormal CT of the head   Chemotherapy induced neutropenia (HCC)   Vocal cord paralysis   Pleural effusion on left   S/P thoracentesis   Nutrition Problem: Inadequate oral intake Etiology: poor appetite  Signs/Symptoms: per patient/family report   Body mass index is 27.62 kg/m.   Candidemia with Candida albicans, neutropenic fever: Port-A-Cath tip culture and blood culture showed Candida albicans.  No growth on repeat blood cultures.  Right upper chest Port-A-Cath was removed by IR on 10/30/2021.  Cardiologist is concerned about performing TEE because of esophageal dysmotility with air-fluid levels on CT chest.  Decision to be made on whether patient can go home on oral fluconazole versus IV fluconazole.  Follow-up with ID for further recommendations.   Large left pleural effusion on CT chest: S/p left-sided thoracentesis with removal of 500 mL of yellowish fluid on 10/31/2021.  Cytology is pending.  Vocal cord paralysis diagnosed by otolaryngologist: CT neck also showed possible left vocal cord weakness or paralysis.  Speech therapist recommended regular diet.    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  CT chest showed interval improvement in the pulmonary nodules.  Follow-up with oncologist.  Chemotherapy-induced pancytopenia: All counts are trending down.  Monitor CBC.  Nausea and vomiting: Continue antiemetics as needed.  Hypokalemia and hypomagnesemia: Improving.  Continue  potassium repletion.  Pyuria, multidrug-resistant E. coli in urine suspected to be due to colonization:  Abnormal head CT: No Chiari I malformation was identified on MRI brain.  Abnormal EKG (T wave inversions in leads I  and aVL, prolonged QTc-460), elevated troponins: Repeat EKG on 10/31/2021 still shows prolonged QTc-494, T wave inversions in leads I and aVL.  No chest pain. Mildly elevated troponins likely from demand ischemia.    AKI, hypophosphatemia: Improved    Diet Order             Diet regular Room service appropriate? Yes; Fluid consistency: Thin  Diet effective now                        Consultants: Cardiologist, otolaryngologist, interventional radiologist, oncologist, infectious disease  Procedures: Port-A-Cath removal on 10/30/2021    Medications:    feeding supplement  237 mL Oral TID BM   LORazepam  0.5 mg Oral TID   multivitamin with minerals  1 tablet Oral Daily   mouth rinse  15 mL Mouth Rinse 4 times per day   scopolamine  1 patch Transdermal Q72H   Continuous Infusions:  fluconazole (DIFLUCAN) IV 400 mg (11/03/21 1712)   promethazine (PHENERGAN) injection (IM or IVPB) 12.5 mg (11/04/21 1315)     Anti-infectives (From admission, onward)    Start     Dose/Rate Route Frequency Ordered Stop   10/30/21 1600  fluconazole (DIFLUCAN) IVPB 400 mg        400 mg 100 mL/hr over 120 Minutes Intravenous Every 24 hours 10/29/21 1250     10/29/21 2300  vancomycin (VANCOREADY) IVPB 750 mg/150 mL  Status:  Discontinued        750 mg 150 mL/hr over 60 Minutes Intravenous Every 24 hours 10/28/21 2234 10/29/21 1028   10/29/21 1630  fluconazole (DIFLUCAN) IVPB 400 mg       See Hyperspace for full Linked Orders Report.   400 mg 100 mL/hr over 120 Minutes Intravenous  Once 10/29/21 1250 10/29/21 1858   10/29/21 1430  fluconazole (DIFLUCAN) IVPB 400 mg       See Hyperspace for full Linked Orders Report.   400 mg 100 mL/hr over 120 Minutes Intravenous  Once 10/29/21 1250 10/29/21 1607   10/29/21 1315  micafungin (MYCAMINE) 100 mg in sodium chloride 0.9 % 100 mL IVPB  Status:  Discontinued        100 mg 105 mL/hr over 1 Hours Intravenous Every 24 hours 10/29/21 1220 10/29/21  1250   10/29/21 1200  vancomycin (VANCOREADY) IVPB 750 mg/150 mL  Status:  Discontinued        750 mg 150 mL/hr over 60 Minutes Intravenous Every 12 hours 10/29/21 1028 10/29/21 1428   10/28/21 2245  meropenem (MERREM) 1 g in sodium chloride 0.9 % 100 mL IVPB  Status:  Discontinued        1 g 200 mL/hr over 30 Minutes Intravenous Every 8 hours 10/28/21 2231 10/29/21 1953   10/28/21 2245  vancomycin (VANCOREADY) IVPB 1750 mg/350 mL        1,750 mg 175 mL/hr over 120 Minutes Intravenous  Once 10/28/21 2231 10/29/21 0119   10/28/21 2015  ceFEPIme (MAXIPIME) 2 g in sodium chloride 0.9 % 100 mL IVPB        2 g 200 mL/hr over 30 Minutes Intravenous  Once 10/28/21 2009 10/28/21 2151   10/28/21 1845  cefTRIAXone (ROCEPHIN) 1 g in sodium chloride 0.9 % 100 mL  IVPB  Status:  Discontinued        1 g 200 mL/hr over 30 Minutes Intravenous  Once 10/28/21 1838 10/28/21 1839              Family Communication/Anticipated D/C date and plan/Code Status   DVT prophylaxis: SCDs Start: 10/28/21 2223     Code Status: Full Code  Family Communication: Plan discussed with her husband at the bedside Disposition Plan: Plan to discharge home in 1 to 2 days   Status is: Inpatient Remains inpatient appropriate because: Candidemia on IV fluconazole       Subjective:   She complains of cough, nausea and vomiting.  Her husband was at the bedside.  Her nurse was also at the bedside.   Objective:    Vitals:   11/03/21 1724 11/03/21 2204 11/04/21 0512 11/04/21 0740  BP: 140/68 135/71 140/84 (!) 150/81  Pulse: 98 90 92 93  Resp:  '16 16 16  '$ Temp: 98.3 F (36.8 C) 98.4 F (36.9 C) 98.3 F (36.8 C) 98.6 F (37 C)  TempSrc: Oral   Axillary  SpO2: 96% 93% 98% 98%  Weight:      Height:       No data found.   Intake/Output Summary (Last 24 hours) at 11/04/2021 1316 Last data filed at 11/03/2021 1700 Gross per 24 hour  Intake --  Output 1450 ml  Net -1450 ml   Filed Weights   10/28/21  1529 10/29/21 0159  Weight: 77.1 kg 73 kg    Exam:  GEN: NAD SKIN: Warm and dry EYES: No pallor or icterus ENT: MMM CV: RRR PULM: CTA B ABD: soft, obese, NT, +BS CNS: AAO x 3, non focal EXT: No edema or tenderness         Data Reviewed:   I have personally reviewed following labs and imaging studies:  Labs: Labs show the following:   Basic Metabolic Panel: Recent Labs  Lab 10/30/21 0514 10/31/21 0349 11/01/21 0416 11/03/21 0500 11/04/21 0502  NA 134* 136 135 135 135  K 3.3* 3.8 3.6 3.3* 3.4*  CL 102 102 101 102 101  CO2 '26 27 28 29 29  '$ GLUCOSE 90 77 86 120* 98  BUN '14 14 15 12 11  '$ CREATININE 0.85 0.94 0.90 0.86 0.91  CALCIUM 8.4* 8.3* 8.3* 8.0* 8.2*  MG 1.4* 1.7 2.1 1.4* 2.0  PHOS 2.6 2.3* 3.7 3.0  --    GFR Estimated Creatinine Clearance: 63.5 mL/min (by C-G formula based on SCr of 0.91 mg/dL). Liver Function Tests: Recent Labs  Lab 10/28/21 1531 10/30/21 0514 11/04/21 0502  AST '30 24 28  '$ ALT 42 30 23  ALKPHOS 68 64 80  BILITOT 0.8 1.0 0.7  PROT 6.4* 6.1* 5.4*  ALBUMIN 3.3* 2.9* 2.6*   Recent Labs  Lab 10/28/21 1531  LIPASE 23   No results for input(s): "AMMONIA" in the last 168 hours. Coagulation profile No results for input(s): "INR", "PROTIME" in the last 168 hours.  CBC: Recent Labs  Lab 10/29/21 0554 10/30/21 0514 10/30/21 1758 11/01/21 0416 11/03/21 0500 11/04/21 0502  WBC 1.0*  1.0* 2.5* 3.8* 4.7 2.8* 2.5*  NEUTROABS 0.2* 1.8 3.0 3.4 1.6*  --   HGB 7.8*  7.9* 8.1* 7.9* 7.9* 7.7* 7.5*  HCT 23.3*  23.6* 24.0* 23.4* 24.0* 23.0* 23.2*  MCV 92.8  93.3 91.6 91.1 93.0 93.5 94.7  PLT 50*  47* 43* 39* 37* 33* 36*   Cardiac Enzymes: No results for input(s): "CKTOTAL", "CKMB", "  CKMBINDEX", "TROPONINI" in the last 168 hours. BNP (last 3 results) No results for input(s): "PROBNP" in the last 8760 hours. CBG: No results for input(s): "GLUCAP" in the last 168 hours. D-Dimer: No results for input(s): "DDIMER" in the last 72  hours. Hgb A1c: No results for input(s): "HGBA1C" in the last 72 hours. Lipid Profile: No results for input(s): "CHOL", "HDL", "LDLCALC", "TRIG", "CHOLHDL", "LDLDIRECT" in the last 72 hours. Thyroid function studies: No results for input(s): "TSH", "T4TOTAL", "T3FREE", "THYROIDAB" in the last 72 hours.  Invalid input(s): "FREET3" Anemia work up: Recent Labs    11/03/21 0500  FERRITIN 900*  TIBC 134*  IRON 62   Sepsis Labs: Recent Labs  Lab 10/28/21 1531 10/28/21 1851 10/28/21 2025 10/29/21 0554 10/30/21 1758 11/01/21 0416 11/03/21 0500 11/04/21 0502  PROCALCITON <0.10  --   --   --   --   --   --   --   WBC 1.0*  1.0*  --   --    < > 3.8* 4.7 2.8* 2.5*  LATICACIDVEN  --  1.3 0.7  --   --   --   --   --    < > = values in this interval not displayed.    Microbiology Recent Results (from the past 240 hour(s))  Urine Culture     Status: Abnormal   Collection Time: 10/28/21  3:30 PM   Specimen: Urine, Clean Catch  Result Value Ref Range Status   Specimen Description   Final    URINE, CLEAN CATCH Performed at [facility], [address] [place], [place] [address]    Special Requests   Final    NONE Performed at [facility], [address] [place], [place] [address]    Culture (A)  Final    >=100,000 COLONIES/mL ESCHERICHIA COLI Confirmed Extended Spectrum Beta-Lactamase Producer (ESBL).  In bloodstream infections from ESBL organisms, carbapenems are preferred over piperacillin/tazobactam. They are shown to have a lower risk of mortality.    Report Status 10/31/2021 FINAL  Final   Organism ID, Bacteria ESCHERICHIA COLI (A)  Final      Susceptibility   Escherichia coli - MIC*    AMPICILLIN >=32 RESISTANT Resistant     CEFAZOLIN >=64 RESISTANT Resistant     CEFEPIME 16 RESISTANT Resistant     CEFTRIAXONE >=64 RESISTANT Resistant     CIPROFLOXACIN 2 RESISTANT Resistant     GENTAMICIN >=16 RESISTANT Resistant     IMIPENEM <=0.25 SENSITIVE  Sensitive     NITROFURANTOIN 32 SENSITIVE Sensitive     TRIMETH/SULFA >=320 RESISTANT Resistant     AMPICILLIN/SULBACTAM >=32 RESISTANT Resistant     PIP/TAZO <=4 SENSITIVE Sensitive     * >=100,000 COLONIES/mL ESCHERICHIA COLI  SARS Coronavirus 2 by RT PCR (hospital order, performed in [place] hospital lab) *cepheid single result test* Anterior Nasal Swab     Status: None   Collection Time: 10/28/21  6:51 PM   Specimen: Anterior Nasal Swab  Result Value Ref Range Status   SARS Coronavirus 2 by RT PCR NEGATIVE NEGATIVE Final    Comment: (NOTE) SARS-CoV-2 target nucleic acids are NOT DETECTED.  The SARS-CoV-2 RNA is generally detectable in upper and lower respiratory specimens during the acute phase of infection. The lowest concentration of SARS-CoV-2 viral copies this assay can detect is 250 copies / mL. A negative result does not preclude SARS-CoV-2 infection and should not be used as the sole basis for treatment or other patient management  decisions.  A negative result may occur with improper specimen collection / handling, submission of specimen other than nasopharyngeal swab, presence of viral mutation(s) within the areas targeted by this assay, and inadequate number of viral copies (<250 copies / mL). A negative result must be combined with clinical observations, patient history, and epidemiological information.  Fact Sheet for Patients:   https://www.patel.info/  Fact Sheet for Healthcare Providers: https://hall.com/  This test is not yet approved or  cleared by the [place] FDA and has been authorized for detection and/or diagnosis of SARS-CoV-2 by FDA under an Emergency Use Authorization (EUA).  This EUA will remain in effect (meaning this test can be used) for the duration of the COVID-19 declaration under Section 564(b)(1) of the Act, 21 U.S.C. section 360bbb-3(b)(1), unless the authorization is terminated or revoked  sooner.  Performed at [facility], [address] [place], [place] [address]   Blood culture (routine x 2)     Status: Abnormal (Preliminary result)   Collection Time: 10/28/21  8:25 PM   Specimen: BLOOD  Result Value Ref Range Status   Specimen Description   Final    BLOOD LEFT ASSIST CONTROL Performed at [facility], [address] [place], [place] [address]    Special Requests   Final    BOTTLES DRAWN AEROBIC AND ANAEROBIC Blood Culture adequate volume Performed at [facility], [address] [place], [place] [address]    Culture  Setup Time   Final    YEAST IN BOTH AEROBIC AND ANAEROBIC BOTTLES CRITICAL VALUE NOTED.  VALUE IS CONSISTENT WITH PREVIOUSLY REPORTED AND CALLED VALUE. Performed at [facility], [address] [place], [place] [address]    Culture CANDIDA ALBICANS (A)  Final   Report Status PENDING  Incomplete  Blood culture (routine x 2)     Status: Abnormal (Preliminary result)   Collection Time: 10/28/21  8:25 PM   Specimen: BLOOD  Result Value Ref Range Status   Specimen Description   Final    BLOOD PORTA CATH Performed at [facility], [address] [place], [place] [address]    Special Requests   Final    BOTTLES DRAWN AEROBIC AND ANAEROBIC Blood Culture adequate volume Performed at [facility], [address] [place], [place] [address]    Culture  Setup Time   Final    Organism ID to follow IN BOTH AEROBIC AND ANAEROBIC BOTTLES YEAST CRITICAL RESULT CALLED TO, READ BACK BY AND VERIFIED WITH: Carey Bullocks 10/29/2021 at 1137 SRR Performed at [facility], [address] [place], [place] [address]    Culture (A)  Final    CANDIDA ALBICANS Sent to [facility] for further susceptibility testing. Performed at [facility], [place] [address], [place], [place] [address]    Report Status PENDING  Incomplete  Blood Culture ID Panel (Reflexed)     Status: Abnormal   Collection  Time: 10/28/21  8:25 PM  Result Value Ref Range Status   Enterococcus faecalis NOT DETECTED NOT DETECTED Final   Enterococcus Faecium NOT DETECTED NOT DETECTED Final   Listeria monocytogenes NOT DETECTED NOT DETECTED Final   Staphylococcus species NOT DETECTED NOT DETECTED Final   Staphylococcus aureus (BCID) NOT DETECTED NOT DETECTED Final   Staphylococcus epidermidis NOT DETECTED NOT DETECTED Final   Staphylococcus lugdunensis NOT DETECTED NOT DETECTED Final   Streptococcus species NOT DETECTED NOT DETECTED Final   Streptococcus agalactiae NOT DETECTED NOT DETECTED Final   Streptococcus  pneumoniae NOT DETECTED NOT DETECTED Final   Streptococcus pyogenes NOT DETECTED NOT DETECTED Final   A.calcoaceticus-baumannii NOT DETECTED NOT DETECTED Final   Bacteroides fragilis NOT DETECTED NOT DETECTED Final   Enterobacterales NOT DETECTED NOT DETECTED Final   Enterobacter cloacae complex NOT DETECTED NOT DETECTED Final   Escherichia coli NOT DETECTED NOT DETECTED Final   Klebsiella aerogenes NOT DETECTED NOT DETECTED Final   Klebsiella oxytoca NOT DETECTED NOT DETECTED Final   Klebsiella pneumoniae NOT DETECTED NOT DETECTED Final   Proteus species NOT DETECTED NOT DETECTED Final   Salmonella species NOT DETECTED NOT DETECTED Final   Serratia marcescens NOT DETECTED NOT DETECTED Final   Haemophilus influenzae NOT DETECTED NOT DETECTED Final   Neisseria meningitidis NOT DETECTED NOT DETECTED Final   Pseudomonas aeruginosa NOT DETECTED NOT DETECTED Final   Stenotrophomonas maltophilia NOT DETECTED NOT DETECTED Final   Candida albicans DETECTED (A) NOT DETECTED Final    Comment: CRITICAL RESULT CALLED TO, READ BACK BY AND VERIFIED WITH: Carey Bullocks 10/29/2021 at 1137 SRR    Candida auris NOT DETECTED NOT DETECTED Final   Candida glabrata NOT DETECTED NOT DETECTED Final   Candida krusei NOT DETECTED NOT DETECTED Final   Candida parapsilosis NOT DETECTED NOT DETECTED Final   Candida tropicalis  NOT DETECTED NOT DETECTED Final   Cryptococcus neoformans/gattii NOT DETECTED NOT DETECTED Final    Comment: Performed at [facility], [address] [place], [place] [address]  Antifungal AST 9 Drug Panel     Status: None (Preliminary result)   Collection Time: 10/28/21  8:25 PM  Result Value Ref Range Status   Organism ID, Yeast Preliminary report  Final    Comment: (NOTE) Specimen has been received and testing has been initiated. Performed At: [facility] [place], [place] [id number] Rush Farmer MD UQ:[id number]    Amphotericin B MIC PENDING  Incomplete   Anidulafungin MIC PENDING  Incomplete   Caspofungin MIC PENDING  Incomplete   Micafungin MIC PENDING  Incomplete   Posaconazole MIC PENDING  Incomplete   Fluconazole Islt MIC PENDING  Incomplete   Flucytosine MIC PENDING  Incomplete   Itraconazole MIC PENDING  Incomplete   Voriconazole MIC PENDING  Incomplete   Source BLOOD  Final    Comment: Performed at [facility], [place]. [address] [place], [place] [address]  MRSA Next Gen by PCR, Nasal     Status: None   Collection Time: 10/29/21 12:11 PM   Specimen: Nasal Mucosa; Nasal Swab  Result Value Ref Range Status   MRSA by PCR Next Gen NOT DETECTED NOT DETECTED Final    Comment: (NOTE) The GeneXpert MRSA Assay (FDA approved for NASAL specimens only), is one component of a comprehensive MRSA colonization surveillance program. It is not intended to diagnose MRSA infection nor to guide or monitor treatment for MRSA infections. Test performance is not FDA approved in patients less than 51 years old. Performed at [facility], [address] [place], [place] [address]   Culture, blood (Routine X 2) w Reflex to ID Panel     Status: None   Collection Time: 10/29/21  2:41 PM   Specimen: BLOOD  Result Value Ref Range Status   Specimen Description BLOOD RIGHT ANTECUBITAL  Final   Special Requests   Final    BOTTLES DRAWN AEROBIC  AND ANAEROBIC Blood Culture adequate volume   Culture   Final    NO GROWTH 5 DAYS Performed at [facility],  [place], [place] [address]    Report Status 11/03/2021 FINAL  Final  Culture, blood (Routine X 2) w Reflex to ID Panel     Status: None   Collection Time: 10/29/21  3:00 PM   Specimen: BLOOD  Result Value Ref Range Status   Specimen Description BLOOD LEFT ANTECUBITAL  Final   Special Requests   Final    BOTTLES DRAWN AEROBIC AND ANAEROBIC Blood Culture adequate volume   Culture   Final    NO GROWTH 5 DAYS Performed at [facility], [address] [place], [place] [address]    Report Status 11/03/2021 FINAL  Final  Culture, blood (Routine X 2) w Reflex to ID Panel     Status: None   Collection Time: 10/30/21  5:14 AM   Specimen: BLOOD  Result Value Ref Range Status   Specimen Description BLOOD LEFT [facility]  Final   Special Requests   Final    BOTTLES DRAWN AEROBIC AND ANAEROBIC Blood Culture adequate volume   Culture   Final    NO GROWTH 5 DAYS Performed at [facility], [address] [place], [place] [address]    Report Status 11/04/2021 FINAL  Final  Culture, blood (Routine X 2) w Reflex to ID Panel     Status: None   Collection Time: 10/30/21  5:15 AM   Specimen: BLOOD  Result Value Ref Range Status   Specimen Description BLOOD LEFT HAND  Final   Special Requests   Final    BOTTLES DRAWN AEROBIC AND ANAEROBIC Blood Culture adequate volume   Culture   Final    NO GROWTH 5 DAYS Performed at [facility], [address], [place], [place] [address]    Report Status 11/04/2021 FINAL  Final  Cath Tip Culture     Status: Abnormal   Collection Time: 10/30/21  3:51 PM   Specimen: Catheter Tip  Result Value Ref Range Status   Specimen Description CATH TIP  Final   Special Requests   Final    CHEST PORT CATH TIP Performed at [facility], [place] [address] [place], [place] [address]    Culture >=100,000  COLONIES/mL CANDIDA ALBICANS (A)  Final   Report Status 11/02/2021 FINAL  Final  Body fluid culture w Gram Stain     Status: None   Collection Time: 10/31/21 12:25 PM   Specimen: PATH Cytology Pleural fluid  Result Value Ref Range Status   Specimen Description   Final    PLEURAL Performed at [facility], [address] [place], [place] [address]    Special Requests   Final    NONE Performed at [facility], [place], [place] [address]    Gram Stain NO WBC SEEN NO ORGANISMS SEEN   Final   Culture   Final    NO GROWTH 3 DAYS Performed at [facility], [place] [address] [place], [place] [address]    Report Status 11/04/2021 FINAL  Final    Procedures and diagnostic studies:  No results found.             LOS: 7 days   Epifanio Labrador  [facility]   Pager on [email or web address]. If 7PM-7AM, please contact night-coverage at [email or web address]     11/04/2021, 1:16 PM

## 2021-11-04 NOTE — Progress Notes (Signed)
   Date of Admission:  10/28/2021   Family ( husband,m sister and niece at bed side) Got permission from patient to talk Thru strata video    ID: Jill Rodriguez is a 61 y.o. female  Principal Problem:   Chemotherapy induced nausea and vomiting Active Problems:   Chemotherapy induced neutropenia (HCC)   Neutropenic fever (HCC)   Pyuria   AKI (acute kidney injury) ([place])   Pancytopenia due to antineoplastic chemotherapy ([place])   HCAP (healthcare-associated pneumonia)   Candidemia (HCC)   Abnormal EKG   Abnormal CT of the head   Vocal cord paralysis   Pleural effusion on left   S/P thoracentesis    Subjective: Pt says she has cough when she talks Cough sets off vomiting which is usually dry heaves Says she eats but vomits intermittently  Medications:   feeding supplement  237 mL Oral TID BM   LORazepam  0.5 mg Oral TID   multivitamin with minerals  1 tablet Oral Daily   mouth rinse  15 mL Mouth Rinse 4 times per day   scopolamine  1 patch Transdermal Q72H    Objective: Vital signs in last 24 hours: Temp:  [98.3 F (36.8 C)-98.6 F (37 C)] 98.6 F (37 C) (07/11 0740) Pulse Rate:  [90-98] 93 (07/11 0740) Resp:  [16] 16 (07/11 0740) BP: (135-150)/(68-84) 150/81 (07/11 0740) SpO2:  [93 %-98 %] 98 % (07/11 0740)   PHYSICAL EXAM:  General: Awake, but does nto talk, flat affect, looks depressed  Eyes: Conjunctivae clear, anicteric sclerae. Pupils are equal EN cannot examine Lungs: b/la ir entry-  Heart:TAchycardia Abdomen: Soft, non-tender,not distended. Bowel sounds normal. No masses Extremities: atraumatic, no cyanosis. No edema. No clubbing Skin: No rashes or lesions. Or bruising Lymph: Cervical, supraclavicular normal. 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 S/p removal TEE cannot be done because of patulous esophagus with air fluid level  Pt on IV fluconazole- will need until 12/10/21 PO fluconazole has the same bioavailability but patient tolerate without vomiting,may do syrup  Chronic cough - both the patulous esophagus with likely aspiration and left vocal cord paralysis is contributing  Pt should be sitting in chair or upright when eating and not spend all the time in bed She should be walking  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

## 2021-11-05 ENCOUNTER — Inpatient Hospital Stay: Payer: BLUE CROSS/BLUE SHIELD

## 2021-11-05 DIAGNOSIS — R112 Nausea with vomiting, unspecified: Secondary | ICD-10-CM | POA: Diagnosis not present

## 2021-11-05 DIAGNOSIS — T451X5A Adverse effect of antineoplastic and immunosuppressive drugs, initial encounter: Secondary | ICD-10-CM | POA: Diagnosis not present

## 2021-11-05 LAB — BASIC METABOLIC PANEL
Anion gap: 6 (ref 5–15)
BUN: 11 mg/dL (ref 8–23)
CO2: 29 mmol/L (ref 22–32)
Calcium: 8.5 mg/dL — ABNORMAL LOW (ref 8.9–10.3)
Chloride: 100 mmol/L (ref 98–111)
Creatinine, Ser: 1.01 mg/dL — ABNORMAL HIGH (ref 0.44–1.00)
GFR, Estimated: >60 mL/min (ref >60)
Glucose, Bld: 108 mg/dL — ABNORMAL HIGH (ref 70–99)
Potassium: 3.9 mmol/L (ref 3.5–5.1)
Sodium: 135 mmol/L (ref 135–145)

## 2021-11-05 LAB — CBC WITH DIFFERENTIAL/PLATELET
Abs Immature Granulocytes: 0.03 10*3/uL (ref 0.00–0.07)
Basophils Absolute: 0 10*3/uL (ref 0.0–0.1)
Basophils Relative: 0 %
Eosinophils Absolute: 0 10*3/uL (ref 0.0–0.5)
Eosinophils Relative: 0 %
HCT: 23.7 % — ABNORMAL LOW (ref 36.0–46.0)
Hemoglobin: 7.8 g/dL — ABNORMAL LOW (ref 12.0–15.0)
Immature Granulocytes: 1 %
Lymphocytes Relative: 24 %
Lymphs Abs: 0.7 10*3/uL (ref 0.7–4.0)
MCH: 31.2 pg (ref 26.0–34.0)
MCHC: 32.9 g/dL (ref 30.0–36.0)
MCV: 94.8 fL (ref 80.0–100.0)
Monocytes Absolute: 0.3 10*3/uL (ref 0.1–1.0)
Monocytes Relative: 11 %
Neutro Abs: 1.9 10*3/uL (ref 1.7–7.7)
Neutrophils Relative %: 64 %
Platelets: 41 10*3/uL — ABNORMAL LOW (ref 150–400)
RBC: 2.5 MIL/uL — ABNORMAL LOW (ref 3.87–5.11)
RDW: 15.7 % — ABNORMAL HIGH (ref 11.5–15.5)
WBC: 2.9 10*3/uL — ABNORMAL LOW (ref 4.0–10.5)
nRBC: 0 % (ref 0.0–0.2)

## 2021-11-05 MED ORDER — IOHEXOL 9 MG/ML PO SOLN
500.0000 mL | ORAL | Status: AC
  Administered 2021-11-05 (×2): 500 mL via ORAL

## 2021-11-05 MED ORDER — ENSURE MAX PROTEIN PO LIQD
11.0000 [oz_av] | Freq: Three times a day (TID) | ORAL | Status: DC
  Administered 2021-11-06 – 2021-11-09 (×6): 11 [oz_av] via ORAL
  Filled 2021-11-05: qty 330

## 2021-11-05 MED ORDER — IOHEXOL 300 MG/ML  SOLN
100.0000 mL | Freq: Once | INTRAMUSCULAR | Status: AC | PRN
  Administered 2021-11-05: 100 mL via INTRAVENOUS

## 2021-11-05 MED ORDER — SODIUM CHLORIDE 0.9 % IV SOLN: INTRAVENOUS | Status: DC

## 2021-11-05 NOTE — Therapy (Addendum)
Patient requires a 3 n 1 due to poor mobility and location of restroom in home

## 2021-11-05 NOTE — Care Plan (Signed)
I went back to talk to the patient.  Her daughter including other family members were at the bedside.  I asked patient that I would like to bring a video translator, however she told me that her daughter cannot speak for her.  We went through all the investigations ongoing, patient not tolerating oral intake, she is vomiting after eating any meal.  Extensive patulous esophagus and severe reflux.  Difficulty to maintain treatment, nutrition and fluid.  Patient is not going to effectively tolerate oral fluconazole.  Only option will be doing IV fluconazole.  Family also concerned about she is not eating, vomiting all the time.  I explained to them as much possible, showed them CT scan of the chest that showed big dilated esophagus.  There is really no cure for this.  I also asked patient and family whether they think she could rather be comfortable with hospice level of care at home as she cannot get chemotherapy in this stage.  Patient wants to designate her daughter as healthcare power of attorney and is agreeable to have another conversation with palliative care.  We will re-consult palliative care.  Given advanced frailty, intolerance to intake, metastatic cancer and not tolerating chemotherapy, patient is appropriate candidate for hospice if desired by patient and family.  More education and counseling will be needed. Patient's daughter wanted to see how much cancer has progressed that will help her to explain to her family to make decisions. Patient already had CT scan of the chest that showed regressed pulmonary tumor. We will order CT scan of the abdomen pelvis with contrast today.

## 2021-11-05 NOTE — TOC Initial Note (Signed)
Transition of Care [facility]) - Initial/Assessment Note    Patient Details  Name: Jill Rodriguez MRN: [id number] Date of Birth: 05/10/1960  [facility]) CM/SW Contact:    Pete Pelt, RN Phone Number: 11/05/2021, 3:14 PM  Clinical Narrative:     Patient lives at home with family, husband can assist with care and transportation.  3 n 1 and wheelchair ordered with adapt.  Daughter at bedside as well.  Family verbalizes no additional needs at this time                    Patient Goals and CMS Choice        Expected Discharge Plan and Services                                                Prior Living Arrangements/Services                       Activities of Daily Living Home Assistive Devices/Equipment: None ADL Screening (condition at time of admission) Patient's cognitive ability adequate to safely complete daily activities?: Yes Is the patient deaf or have difficulty hearing?: No Does the patient have difficulty seeing, even when wearing glasses/contacts?: No Does the patient have difficulty concentrating, remembering, or making decisions?: No Patient able to express need for assistance with ADLs?: Yes Does the patient have difficulty dressing or bathing?: No Independently performs ADLs?: Yes (appropriate for developmental age) Does the patient have difficulty walking or climbing stairs?: Yes Weakness of Legs: Both Weakness of Arms/Hands: Both  Permission Sought/Granted                  Emotional Assessment              Admission diagnosis:  Chemotherapy-induced neutropenia ([place]) [D70.1, T45.1X5A] Acute cystitis with hematuria [N30.01] Neutropenic fever ([place]) [D70.9, R50.81] Patient Active Problem List   Diagnosis Date Noted   Pleural effusion on left 10/31/2021   S/P thoracentesis    Vocal cord paralysis 10/30/2021   Candidemia ([place]) 10/29/2021   Abnormal EKG 10/29/2021   Abnormal CT of the head 10/29/2021    Neutropenic fever ([place]) 10/28/2021   Pyuria 10/28/2021   AKI (acute kidney injury) ([place]) 10/28/2021   Pancytopenia due to antineoplastic chemotherapy ([place]) 10/28/2021   HCAP (healthcare-associated pneumonia) 10/28/2021   Cancer with pulmonary metastases ([place]) 08/27/2021   Chronic cough 07/25/2021   Dysuria 07/31/2020   Chemotherapy induced neutropenia ([place]) 06/14/2020   Hypocalcemia 06/14/2020   Normocytic anemia 06/14/2020   Thoracic lymphadenopathy 05/20/2020   Chemotherapy induced nausea and vomiting 10/31/2019   Heartburn 10/17/2019   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 ([place]) 10/10/2019   Encounter for antineoplastic chemotherapy 10/10/2019   Non-intractable vomiting 10/10/2019   Thrush 10/10/2019   Goals of care, counseling/discussion 09/20/2019   Malignant neoplasm of cervix ([place]) 08/30/2019   PCP:  Pcp, No Pharmacy:   [facility] [place], [place] AT [facility] OF [address] [place] [address] Phone: [phone] Fax: [phone]     Social Determinants of Health (SDOH) Interventions    Readmission Risk Interventions     No data to display

## 2021-11-05 NOTE — Progress Notes (Addendum)
Physical Therapy Treatment Patient Details Name: Jill Rodriguez MRN: [id number] DOB: 19-Mar-1961 Today's Date: 11/05/2021   History of Present Illness Pt is a 61 y.o. 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 coli UTI, presents to the ED with intractable nausea and vomiting that started 2 days prior.    PT Comments    Pt did well with PT session and though clearly feeling poorly (even had one brief episode of low volume vomiting) she was able to ambulate to the door 2x with walker and close assist - she did have general unsteadiness, on LOB needing assist to recover and reports of significant fatigue at ~15 ft - vitals remain relatively steady.  Pt was able to participate with some exercises but nausea and fatigue did limit her tolerance.   Recommendations for follow up therapy are one component of a multi-disciplinary discharge planning process, led by the attending physician.  Recommendations may be updated based on patient status, additional functional criteria and insurance authorization.  Follow Up Recommendations  Home health PT     Assistance Recommended at Discharge Frequent or constant Supervision/Assistance  Patient can return home with the following A lot of help with walking and/or transfers;A lot of help with bathing/dressing/bathroom;Assist for transportation;Direct supervision/assist for financial management;Assistance with cooking/housework;Help with stairs or ramp for entrance   Equipment Recommendations  Wheelchair (measurements PT);Wheelchair cushion (measurements PT);BSC/3in1 (may actually have a w/c at home?)   Recommendations for Other Services       Precautions / Restrictions  Precautions Precautions: Fall Restrictions Weight Bearing Restrictions: No     Mobility  Bed Mobility Overal bed mobility: Needs Assistance Bed Mobility: Supine to Sit     Supine to sit: Mod assist     General bed mobility comments: Pt was able to start rolling and getting LEs toward EOB, ultimately needed assist with both trunk and LEs to get to actual EOB sitting    Transfers Overall transfer level: Needs assistance Equipment used: Rolling walker (2 wheels) Transfers: Sit to/from Stand Sit to Stand: Min assist           General transfer comment: Pt able to complete 3 seperate sit to stands t/o session.  Reliant on UEs but with only light cuing to initiate movement and insure forward momentum she did relatively well.  Reliant on walker to stabilize    Ambulation/Gait Ambulation/Gait assistance: Min Web designer (Feet): 15 Feet Assistive device: Rolling walker (2 wheels)         General Gait Details: Pt able to walk to the door x 2 with rest break between.  Very slow and labored effort and she did have multiple stagger steps as wel as 1 overt LOB needing direct assist to maintain upright.  Pt motivated, vitals remained WFL, good effort but quick to fatigue with this relatively modest distance.   Stairs             Wheelchair Mobility    Modified Rankin (Stroke Patients Only)       Balance Overall balance assessment: Needs assistance Sitting-balance support: Feet supported, Bilateral upper extremity supported Sitting balance-Leahy Scale: Fair     Standing balance support: Reliant on assistive device for balance Standing balance-Leahy Scale: Poor Standing balance comment: no knee buckling  but multiple episodes of stagger stepping and one LOB during ambulation efforts                            Cognition Arousal/Alertness: Awake/alert Behavior During Therapy: WFL for tasks assessed/performed Overall Cognitive Status: Within  Functional Limits for tasks assessed                                          Exercises General Exercises - Lower Extremity Heel Slides: Strengthening, 10 reps Hip ABduction/ADduction: AROM, 5 reps    General Comments General comments (skin integrity, edema, etc.): pt requests daughter act as interperter      Pertinent Vitals/Pain Pain Assessment Pain Assessment:  (more nausea than pain)    Home Living                          Prior Function            PT Goals (current goals can now be found in the care plan section) Progress towards PT goals: Progressing toward goals    Frequency    Min 2X/week      PT Plan Current plan remains appropriate    Co-evaluation              AM-PAC PT "6 Clicks" Mobility   Outcome Measure  Help needed turning from your back to your side while in a flat bed without using bedrails?: A Little Help needed moving from lying on your back to sitting on the side of a flat bed without using bedrails?: A Little Help needed moving to and from a bed to a chair (including a wheelchair)?: A Little Help needed standing up from a chair using your arms (e.g., wheelchair or bedside chair)?: A Little Help needed to walk in hospital room?: A Lot Help needed climbing 3-5 steps with a railing? : A Lot 6 Click Score: 16    End of Session Equipment Utilized During Treatment: Gait belt Activity Tolerance: Patient limited by fatigue;Other (comment) Patient left: with call bell/phone within reach;with chair alarm set;with family/visitor present Nurse Communication: Mobility status PT Visit Diagnosis: Other abnormalities of gait and mobility (R26.89);Difficulty in walking, not elsewhere classified (R26.2);Muscle weakness (generalized) (M62.81)     Time: 7782-4235 PT Time Calculation (min) (ACUTE ONLY): 30 min  Charges:  $Gait Training: 8-22 mins $Therapeutic Exercise: 8-22 mins                     Kreg Shropshire,  DPT 11/05/2021, 4:50 PM

## 2021-11-05 NOTE — Progress Notes (Signed)
Nutrition Follow-up  DOCUMENTATION CODES:   Not applicable  INTERVENTION:   Discontinue Ensure Enlive  Change to Ensure Max protein supplement TID, each supplement provides 150kcal and 30g of protein.  Add Magic cup TID with meals, each supplement provides 290 kcal and 9 grams of protein  MVI po daily   Pt at high refeed risk; recommend monitor potassium, magnesium and phosphorus labs daily until stable  NUTRITION DIAGNOSIS:   Inadequate oral intake related to poor appetite as evidenced by per patient/family report.  GOAL:   Patient will meet greater than or equal to 90% of their needs -not met   MONITOR:   PO intake, Supplement acceptance, Labs, Weight trends, Skin, I & O's  ASSESSMENT:   61 y/o female with h/o stage IV cervical cancer with pulmonary metastasis on chemotherapy who is admitted with nausea/vomting, pancytopenia, candidemia with candida albicans, neutropenic fever, plueral effusion, vocal cord paralysis and esophageal dysmotility.  Met with pt and pt's family in room today. Pt's daughter at bedside is able to translate for patient. Pt is feeling ok today. Pt currently trying to drink contrast for ordered CT scan. Pt has had poor oral intake in hospital with intermittent vomiting/regurgitation. Pt was able to eat 3/4 of a [employer]-fil[employer] sandwich today and drank a small amount of Ensure. Pt reports that she does not really like the Ensure Enlive as it is too sweet; pt is willing to try Ensure Max as this has less sugar. RD will change supplements and add Magic Cups to meal trays. Pt remains at high refeed risk. Pt seen by palliative care and requests full care but no feeding tube. Daughter with questions today regarding pt's nutritional status and G-tube. RD explained to pt's daughter about G-tube, the process of having it placed, how it works, removal if possible, etc. Daughter is interested in feeding tube and is planning to discuss again with her mother. RD explained  that if pt's disease has progressed or if poor prognosis with no further treatment options available then G-tube wound not prolong or improve pt's quality of life but if there are plans for further treatments and pt wishes for full care, then feeding tube would be beneficial as pt is not able to eat enough to meet her needs. Family aware of recommendations and are waiting for CT results before making any decisions. Spoke with MD who feels pt is nearing end of life and would not benefit from tube placement.   No new weight since 7/5; RD will request daily weights.     Medications reviewed and include: MVI, scopolamine, diflucan  Labs reviewed: K 3.9 wnl, creat 1.01(H) Mg 2.0 wnl- 7/11 P 3.0 wnl- 7/10 Wbc- 2.9(L), Hgb 7.8(L), Hct 23.7(L)  Diet Order:   Diet Order             Diet regular Room service appropriate? Yes; Fluid consistency: Thin  Diet effective now                  EDUCATION NEEDS:   No education needs have been identified at this time  Skin:  Skin Assessment: Reviewed RN Assessment  Last BM:  7/8- type 2  Height:   Ht Readings from Last 1 Encounters:  10/29/21 _0  (1.626 m)    Weight:   Wt Readings from Last 1 Encounters:  10/29/21 73 kg    Ideal Body Weight:  54.5 kg  BMI:  Body mass index is 27.62 kg/m.  Estimated Nutritional Needs:   Kcal:  1700-2000kcal/day  Protein:  85-100g/day  Fluid:  1.7-1.9L/day  Koleen Distance MS, RD, LDN Please refer to [facility] for RD and/or RD on-call/weekend/after hours pager

## 2021-11-05 NOTE — Progress Notes (Signed)
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  She was treated symptomatically, found to have candidemia and currently on IV Diflucan.  She was also found to have vocal cord paralysis and esophageal dysmotility with air-fluid levels.  Underwent left-sided thoracentesis for large pleural effusion removal of 500 cc of pleural fluid.  Cytology is pending.  Remains in the hospital, did very debilitated and unable to tolerate oral medications.   Assessment & Plan:   Candidemia with Candida albicans, neutropenic fever: Port-A-Cath removed, culture with Candida albicans.  Repeat cultures cleared. TEE not performed because of severe risk with esophageal dysmotility. Patient not tolerating oral intake, ID following for determination of injectable Diflucan versus syrup.  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  Follow-up with outpatient oncology.  Seen by palliative care.  Currently desires full code.  She has used palliation appropriate.  Left pleural effusion: Cytology pending.  Currently on room air.  Pyuria with multiple drug-resistant E. coli: Suspected colonization.  Acute kidney injury Hypokalemia Hypomagnesemia Hypophosphatemia  Improved and adequate.  Advance physical debility and frailty: Working with PT OT.  Has adequate support system at home.  Plan to go home with outpatient PT OT.    DVT prophylaxis: SCDs Start: 10/28/21 2223   Code Status: Full code Family  Communication: Multiple family members at the bedside Disposition Plan: Status is: Inpatient Remains inpatient appropriate because: IV antibiotics     Consultants:  Infectious disease  Procedures:  None  Antimicrobials:  Diflucan   Subjective: Went to examine patient.  Multiple family members at the bedside looking at each other face.  Patient with flat affect.  Not keeping up conversation even with translation.  Objective: Vitals:   11/04/21 2013 11/05/21 0439 11/05/21 0748 11/05/21 1144  BP: (!) 148/81 125/67 135/70 (!) 152/75  Pulse: (!) 102 (!) 102 97 97  Resp: 16 16    Temp: 99 F (37.2 C) 98.8 F (37.1 C) 99.1 F (37.3 C) 99.4 F (37.4 C)  TempSrc:      SpO2: 98% 93% 99% 99%  Weight:      Height:        Intake/Output Summary (Last 24 hours) at 11/05/2021 1328 Last data filed at 11/05/2021 0439 Gross per 24 hour  Intake --  Output 400 ml  Net -400 ml   Filed Weights   10/28/21 1529 10/29/21 0159  Weight: 77.1 kg 73 kg    Examination:  General: Sick looking, flat affect.  Not in distress. Cardiovascular: S1-S2 normal.  Regular rate rhythm. Respiratory: Bilateral clear. Gastrointestinal: Soft.  Nontender.  Obese and pendulous. Ext: No deformities. Neuro: Alert oriented.  Flat affect.  Anxious.    Data Reviewed: I have personally reviewed following labs and imaging studies  CBC: Recent Labs  Lab 10/30/21 0514 10/30/21 1758 11/01/21 0416 11/03/21 0500 11/04/21 0502 11/05/21 0506  WBC 2.5* 3.8* 4.7 2.8* 2.5* 2.9*  NEUTROABS 1.8 3.0 3.4 1.6*  --  1.9  HGB 8.1* 7.9* 7.9* 7.7* 7.5* 7.8*  HCT 24.0* 23.4* 24.0* 23.0* 23.2* 23.7*  MCV 91.6 91.1 93.0 93.5 94.7 94.8  PLT 43* 39* 37* 33*  36* 41*   Basic Metabolic Panel: Recent Labs  Lab 10/30/21 0514 10/31/21 0349 11/01/21 0416 11/03/21 0500 11/04/21 0502 11/05/21 0506  NA 134* 136 135 135 135 135  K 3.3* 3.8 3.6 3.3* 3.4* 3.9  CL 102 102 101 102 101 100  CO2 '26 27 28 29 29 29  '$ GLUCOSE 90  77 86 120* 98 108*  BUN '14 14 15 12 11 11  '$ CREATININE 0.85 0.94 0.90 0.86 0.91 1.01*  CALCIUM 8.4* 8.3* 8.3* 8.0* 8.2* 8.5*  MG 1.4* 1.7 2.1 1.4* 2.0  --   PHOS 2.6 2.3* 3.7 3.0  --   --    GFR: Estimated Creatinine Clearance: 57.3 mL/min (A) (by C-G formula based on SCr of 1.01 mg/dL (H)). Liver Function Tests: Recent Labs  Lab 10/30/21 0514 11/04/21 0502  AST 24 28  ALT 30 23  ALKPHOS 64 80  BILITOT 1.0 0.7  PROT 6.1* 5.4*  ALBUMIN 2.9* 2.6*   No results for input(s): "LIPASE", "AMYLASE" in the last 168 hours. No results for input(s): "AMMONIA" in the last 168 hours. Coagulation Profile: No results for input(s): "INR", "PROTIME" in the last 168 hours. Cardiac Enzymes: No results for input(s): "CKTOTAL", "CKMB", "CKMBINDEX", "TROPONINI" in the last 168 hours. BNP (last 3 results) No results for input(s): "PROBNP" in the last 8760 hours. HbA1C: No results for input(s): "HGBA1C" in the last 72 hours. CBG: No results for input(s): "GLUCAP" in the last 168 hours. Lipid Profile: No results for input(s): "CHOL", "HDL", "LDLCALC", "TRIG", "CHOLHDL", "LDLDIRECT" in the last 72 hours. Thyroid Function Tests: No results for input(s): "TSH", "T4TOTAL", "FREET4", "T3FREE", "THYROIDAB" in the last 72 hours. Anemia Panel: Recent Labs    11/03/21 0500  FERRITIN 900*  TIBC 134*  IRON 62   Sepsis Labs: No results for input(s): "PROCALCITON", "LATICACIDVEN" in the last 168 hours.  Recent Results (from the past 240 hour(s))  Urine Culture     Status: Abnormal   Collection Time: 10/28/21  3:30 PM   Specimen: Urine, Clean Catch  Result Value Ref Range Status   Specimen Description   Final    URINE, CLEAN CATCH Performed at [facility], [address] [place], [place] [address]    Special Requests   Final    NONE Performed at [facility], [address] [place], [place] [address]    Culture (A)  Final    >=100,000 COLONIES/mL ESCHERICHIA  COLI Confirmed Extended Spectrum Beta-Lactamase Producer (ESBL).  In bloodstream infections from ESBL organisms, carbapenems are preferred over piperacillin/tazobactam. They are shown to have a lower risk of mortality.    Report Status 10/31/2021 FINAL  Final   Organism ID, Bacteria ESCHERICHIA COLI (A)  Final      Susceptibility   Escherichia coli - MIC*    AMPICILLIN >=32 RESISTANT Resistant     CEFAZOLIN >=64 RESISTANT Resistant     CEFEPIME 16 RESISTANT Resistant     CEFTRIAXONE >=64 RESISTANT Resistant     CIPROFLOXACIN 2 RESISTANT Resistant     GENTAMICIN >=16 RESISTANT Resistant     IMIPENEM <=0.25 SENSITIVE Sensitive     NITROFURANTOIN 32 SENSITIVE Sensitive     TRIMETH/SULFA >=320 RESISTANT Resistant     AMPICILLIN/SULBACTAM >=32 RESISTANT Resistant     PIP/TAZO <=4 SENSITIVE Sensitive     * >=100,000 COLONIES/mL ESCHERICHIA COLI  SARS Coronavirus 2 by RT PCR (hospital order, performed in [facility] hospital lab) *cepheid single result test* Anterior Nasal Swab  Status: None   Collection Time: 10/28/21  6:51 PM   Specimen: Anterior Nasal Swab  Result Value Ref Range Status   SARS Coronavirus 2 by RT PCR NEGATIVE NEGATIVE Final    Comment: (NOTE) SARS-CoV-2 target nucleic acids are NOT DETECTED.  The SARS-CoV-2 RNA is generally detectable in upper and lower respiratory specimens during the acute phase of infection. The lowest concentration of SARS-CoV-2 viral copies this assay can detect is 250 copies / mL. A negative result does not preclude SARS-CoV-2 infection and should not be used as the sole basis for treatment or other patient management decisions.  A negative result may occur with improper specimen collection / handling, submission of specimen other than nasopharyngeal swab, presence of viral mutation(s) within the areas targeted by this assay, and inadequate number of viral copies (<250 copies / mL). A negative result must be combined with  clinical observations, patient history, and epidemiological information.  Fact Sheet for Patients:   https://www.patel.info/  Fact Sheet for Healthcare Providers: https://hall.com/  This test is not yet approved or  cleared by the [place] FDA and has been authorized for detection and/or diagnosis of SARS-CoV-2 by FDA under an Emergency Use Authorization (EUA).  This EUA will remain in effect (meaning this test can be used) for the duration of the COVID-19 declaration under Section 564(b)(1) of the Act, 21 U.S.C. section 360bbb-3(b)(1), unless the authorization is terminated or revoked sooner.  Performed at [facility], [address] [place], [place] [address]   Blood culture (routine x 2)     Status: Abnormal (Preliminary result)   Collection Time: 10/28/21  8:25 PM   Specimen: BLOOD  Result Value Ref Range Status   Specimen Description   Final    BLOOD LEFT ASSIST CONTROL Performed at [facility], [address] [place], [place] [address]    Special Requests   Final    BOTTLES DRAWN AEROBIC AND ANAEROBIC Blood Culture adequate volume Performed at [facility], [address] [place], [place] [address]    Culture  Setup Time   Final    YEAST IN BOTH AEROBIC AND ANAEROBIC BOTTLES CRITICAL VALUE NOTED.  VALUE IS CONSISTENT WITH PREVIOUSLY REPORTED AND CALLED VALUE. Performed at [facility], [address] [place], [place] [address]    Culture CANDIDA ALBICANS (A)  Final   Report Status PENDING  Incomplete  Blood culture (routine x 2)     Status: Abnormal (Preliminary result)   Collection Time: 10/28/21  8:25 PM   Specimen: BLOOD  Result Value Ref Range Status   Specimen Description   Final    BLOOD PORTA CATH Performed at [facility], [address], [place], [place] [address]    Special Requests   Final    BOTTLES DRAWN AEROBIC AND ANAEROBIC Blood Culture adequate  volume Performed at [facility], [address] [place], [place] [address]    Culture  Setup Time   Final    Organism ID to follow IN BOTH AEROBIC AND ANAEROBIC BOTTLES YEAST CRITICAL RESULT CALLED TO, READ BACK BY AND VERIFIED WITH: Carey Bullocks 10/29/2021 at 1137 SRR Performed at [facility], [address] [place], [place] [address]    Culture (A)  Final    CANDIDA ALBICANS Sent to [place] for further susceptibility testing. Performed at [facility], [place] [address] [place], [place] [address]    Report Status PENDING  Incomplete  Blood Culture ID Panel (Reflexed)  Status: Abnormal   Collection Time: 10/28/21  8:25 PM  Result Value Ref Range Status   Enterococcus faecalis NOT DETECTED NOT DETECTED Final   Enterococcus Faecium NOT DETECTED NOT DETECTED Final   Listeria monocytogenes NOT DETECTED NOT DETECTED Final   Staphylococcus species NOT DETECTED NOT DETECTED Final   Staphylococcus aureus (BCID) NOT DETECTED NOT DETECTED Final   Staphylococcus epidermidis NOT DETECTED NOT DETECTED Final   Staphylococcus lugdunensis NOT DETECTED NOT DETECTED Final   Streptococcus species NOT DETECTED NOT DETECTED Final   Streptococcus agalactiae NOT DETECTED NOT DETECTED Final   Streptococcus pneumoniae NOT DETECTED NOT DETECTED Final   Streptococcus pyogenes NOT DETECTED NOT DETECTED Final   A.calcoaceticus-baumannii NOT DETECTED NOT DETECTED Final   Bacteroides fragilis NOT DETECTED NOT DETECTED Final   Enterobacterales NOT DETECTED NOT DETECTED Final   Enterobacter cloacae complex NOT DETECTED NOT DETECTED Final   Escherichia coli NOT DETECTED NOT DETECTED Final   Klebsiella aerogenes NOT DETECTED NOT DETECTED Final   Klebsiella oxytoca NOT DETECTED NOT DETECTED Final   Klebsiella pneumoniae NOT DETECTED NOT DETECTED Final   Proteus species NOT DETECTED NOT DETECTED Final   Salmonella species NOT DETECTED NOT DETECTED Final   Serratia marcescens NOT  DETECTED NOT DETECTED Final   Haemophilus influenzae NOT DETECTED NOT DETECTED Final   Neisseria meningitidis NOT DETECTED NOT DETECTED Final   Pseudomonas aeruginosa NOT DETECTED NOT DETECTED Final   Stenotrophomonas maltophilia NOT DETECTED NOT DETECTED Final   Candida albicans DETECTED (A) NOT DETECTED Final    Comment: CRITICAL RESULT CALLED TO, READ BACK BY AND VERIFIED WITH: Carey Bullocks 10/29/2021 at 1137 SRR    Candida auris NOT DETECTED NOT DETECTED Final   Candida glabrata NOT DETECTED NOT DETECTED Final   Candida krusei NOT DETECTED NOT DETECTED Final   Candida parapsilosis NOT DETECTED NOT DETECTED Final   Candida tropicalis NOT DETECTED NOT DETECTED Final   Cryptococcus neoformans/gattii NOT DETECTED NOT DETECTED Final    Comment: Performed at [facility], [address] [place], [place] [address]  Antifungal AST 9 Drug Panel     Status: None (Preliminary result)   Collection Time: 10/28/21  8:25 PM  Result Value Ref Range Status   Organism ID, Yeast Preliminary report  Final    Comment: (NOTE) Specimen has been received and testing has been initiated. Performed At: [facility] [place], [place] [id number] Rush Farmer MD [id number]    Amphotericin B MIC PENDING  Incomplete   Anidulafungin MIC PENDING  Incomplete   Caspofungin MIC PENDING  Incomplete   Micafungin MIC PENDING  Incomplete   Posaconazole MIC PENDING  Incomplete   Fluconazole Islt MIC PENDING  Incomplete   Flucytosine MIC PENDING  Incomplete   Itraconazole MIC PENDING  Incomplete   Voriconazole MIC PENDING  Incomplete   Source BLOOD  Final    Comment: Performed at [facility], [place] [place], [place] [address]  MRSA Next Gen by PCR, Nasal     Status: None   Collection Time: 10/29/21 12:11 PM   Specimen: Nasal Mucosa; Nasal Swab  Result Value Ref Range Status   MRSA by PCR Next Gen NOT DETECTED NOT DETECTED Final    Comment: (NOTE) The  GeneXpert MRSA Assay (FDA approved for NASAL specimens only), is one component of a comprehensive MRSA colonization surveillance program. It is not intended to diagnose MRSA infection nor to guide or monitor treatment for MRSA infections. Test performance is not FDA approved  in patients less than 63 years old. Performed at [facility], [address] [place], [place] [address]   Culture, blood (Routine X 2) w Reflex to ID Panel     Status: None   Collection Time: 10/29/21  2:41 PM   Specimen: BLOOD  Result Value Ref Range Status   Specimen Description BLOOD RIGHT ANTECUBITAL  Final   Special Requests   Final    BOTTLES DRAWN AEROBIC AND ANAEROBIC Blood Culture adequate volume   Culture   Final    NO GROWTH 5 DAYS Performed at [facility], [address] [place], [place] [address]    Report Status 11/03/2021 FINAL  Final  Culture, blood (Routine X 2) w Reflex to ID Panel     Status: None   Collection Time: 10/29/21  3:00 PM   Specimen: BLOOD  Result Value Ref Range Status   Specimen Description BLOOD LEFT ANTECUBITAL  Final   Special Requests   Final    BOTTLES DRAWN AEROBIC AND ANAEROBIC Blood Culture adequate volume   Culture   Final    NO GROWTH 5 DAYS Performed at [facility], [address] [place], [place] [address]    Report Status 11/03/2021 FINAL  Final  Culture, blood (Routine X 2) w Reflex to ID Panel     Status: None   Collection Time: 10/30/21  5:14 AM   Specimen: BLOOD  Result Value Ref Range Status   Specimen Description BLOOD LEFT [facility]  Final   Special Requests   Final    BOTTLES DRAWN AEROBIC AND ANAEROBIC Blood Culture adequate volume   Culture   Final    NO GROWTH 5 DAYS Performed at [facility], [address] [place], [place] [address]    Report Status 11/04/2021 FINAL  Final  Culture, blood (Routine X 2) w Reflex to ID Panel     Status: None   Collection Time: 10/30/21  5:15 AM   Specimen: BLOOD  Result  Value Ref Range Status   Specimen Description BLOOD LEFT HAND  Final   Special Requests   Final    BOTTLES DRAWN AEROBIC AND ANAEROBIC Blood Culture adequate volume   Culture   Final    NO GROWTH 5 DAYS Performed at [facility], [address] [place], [place] [address]    Report Status 11/04/2021 FINAL  Final  Cath Tip Culture     Status: Abnormal   Collection Time: 10/30/21  3:51 PM   Specimen: Catheter Tip  Result Value Ref Range Status   Specimen Description CATH TIP  Final   Special Requests   Final    CHEST PORT CATH TIP Performed at [facility], [place] [address], [place], [place] [address]    Culture >=100,000 COLONIES/mL CANDIDA ALBICANS (A)  Final   Report Status 11/02/2021 FINAL  Final  Body fluid culture w Gram Stain     Status: None   Collection Time: 10/31/21 12:25 PM   Specimen: PATH Cytology Pleural fluid  Result Value Ref Range Status   Specimen Description   Final    PLEURAL Performed at [facility], [address] [place], [place] [address]    Special Requests   Final    NONE Performed at [facility], [address] [place], [place] [address]    Gram Stain NO WBC SEEN NO ORGANISMS SEEN   Final   Culture   Final    NO GROWTH 3 DAYS Performed  at [facility], [place] [address] [place],  [address]    Report Status 11/04/2021 FINAL  Final         Radiology Studies: No results found.      Scheduled Meds:  feeding supplement  237 mL Oral TID BM   LORazepam  0.5 mg Oral TID   multivitamin with minerals  1 tablet Oral Daily   mouth rinse  15 mL Mouth Rinse 4 times per day   scopolamine  1 patch Transdermal Q72H   Continuous Infusions:  fluconazole (DIFLUCAN) IV Stopped (11/05/21 0137)   promethazine (PHENERGAN) injection (IM or IVPB) 12.5 mg (11/04/21 1315)     LOS: 8 days    Time spent: 35 minutes    Barb Merino, MD [facility] Pager [phone]

## 2021-11-06 ENCOUNTER — Inpatient Hospital Stay: Payer: Self-pay

## 2021-11-06 DIAGNOSIS — T451X5A Adverse effect of antineoplastic and immunosuppressive drugs, initial encounter: Secondary | ICD-10-CM | POA: Diagnosis not present

## 2021-11-06 DIAGNOSIS — R112 Nausea with vomiting, unspecified: Secondary | ICD-10-CM | POA: Diagnosis not present

## 2021-11-06 LAB — ANTIFUNGAL AST 9 DRUG PANEL
Amphotericin B MIC: 0.5
Fluconazole Islt MIC: 0.5
Flucytosine MIC: 0.12
Itraconazole MIC: 0.12
Posaconazole MIC: 0.06

## 2021-11-06 MED ORDER — DICLOFENAC SODIUM 1 % EX GEL
2.0000 g | Freq: Four times a day (QID) | CUTANEOUS | Status: DC
Start: 2021-11-06 — End: 2021-11-10
  Administered 2021-11-07 – 2021-11-10 (×8): 2 g via TOPICAL
  Filled 2021-11-06: qty 100

## 2021-11-06 NOTE — Progress Notes (Signed)
Physical Therapy Treatment Patient Details Name: Jill Rodriguez MRN: [id number] DOB: June 02, 1960 Today's Date: 11/06/2021   History of Present Illness Pt is a 61 y.o. female with medical history significant for Recurrent squamous cell carcinoma of the cervix now metastatic to lung, restarted on chemo in May 5093 at [facility], complicated by chemotherapy-induced nausea vomiting and neutropenia, hospitalized at [facility] from 6/9 to 6/16 with chemotherapy induced nausea and vomiting as well as E. coli UTI, presents to the ED with intractable nausea and vomiting that started 2 days prior.    PT Comments    Patient alert, agreeable to mobility, daughter in room and interpreted. The patient was able to supine to sit with minA use of bed rails and extra time. Sit <> Stand with RW and CGA. Pt endorsed some lightheadedness that improved over time. She ambulated ~52f with RW and CGA, very slow and somewhat effortful for pt, fatigued quickly. A few seated exercises performed as well with verbal cues. Returned to supine modA, all needs in reach. The patient would benefit from further skilled PT intervention to continue to progress towards goals. Recommendation remains appropriate.     Recommendations for follow up therapy are one component of a multi-disciplinary discharge planning process, led by the attending physician.  Recommendations may be updated based on patient status, additional functional criteria and insurance authorization.  Follow Up Recommendations  Home health PT     Assistance Recommended at Discharge Frequent or constant Supervision/Assistance  Patient can return home with the following A lot of help with walking and/or transfers;A lot of help with bathing/dressing/bathroom;Assist for transportation;Direct supervision/assist for financial management;Assistance with cooking/housework;Help with stairs or ramp for entrance   Equipment Recommendations  Wheelchair (measurements PT);Wheelchair  cushion (measurements PT);BSC/3in1 (seems that pt may actually have a w/c at home?)    Recommendations for Other Services       Precautions / Restrictions Precautions Precautions: Fall Restrictions Weight Bearing Restrictions: No     Mobility  Bed Mobility Overal bed mobility: Needs Assistance Bed Mobility: Supine to Sit, Sit to Supine     Supine to sit: Min assist Sit to supine: Mod assist        Transfers Overall transfer level: Needs assistance Equipment used: Rolling walker (2 wheels) Transfers: Sit to/from Stand Sit to Stand: Min guard                Ambulation/Gait Ambulation/Gait assistance: Min guard Gait Distance (Feet): 15 Feet Assistive device: Rolling walker (2 wheels)         General Gait Details: very slow, careful gait. no true LOB this session but fatigued quickly   Stairs             Wheelchair Mobility    Modified Rankin (Stroke Patients Only)       Balance Overall balance assessment: Needs assistance Sitting-balance support: Feet supported Sitting balance-Leahy Scale: Fair     Standing balance support: Reliant on assistive device for balance Standing balance-Leahy Scale: Poor                              Cognition Arousal/Alertness: Awake/alert Behavior During Therapy: WFL for tasks assessed/performed Overall Cognitive Status: Within Functional Limits for tasks assessed  Exercises General Exercises - Lower Extremity Long Arc Quad: AROM, Strengthening, Both, 10 reps Hip Flexion/Marching: AROM, Seated, Strengthening, Both, 10 reps Toe Raises: 10 reps, Both, AROM, Strengthening, Seated Heel Raises: AROM, Strengthening, Both, 10 reps    General Comments        Pertinent Vitals/Pain Pain Assessment Pain Assessment: Faces Faces Pain Scale: Hurts little more Pain Location: bilat leg pain Pain Descriptors / Indicators: Cramping Pain  Intervention(s): Limited activity within patient's tolerance, Monitored during session, Repositioned    Home Living                          Prior Function            PT Goals (current goals can now be found in the care plan section) Progress towards PT goals: Progressing toward goals    Frequency    Min 2X/week      PT Plan Current plan remains appropriate    Co-evaluation              AM-PAC PT "6 Clicks" Mobility   Outcome Measure  Help needed turning from your back to your side while in a flat bed without using bedrails?: A Little Help needed moving from lying on your back to sitting on the side of a flat bed without using bedrails?: A Little Help needed moving to and from a bed to a chair (including a wheelchair)?: A Little Help needed standing up from a chair using your arms (e.g., wheelchair or bedside chair)?: A Little Help needed to walk in hospital room?: A Lot Help needed climbing 3-5 steps with a railing? : A Lot 6 Click Score: 16    End of Session Equipment Utilized During Treatment: Gait belt Activity Tolerance: Patient limited by fatigue;Other (comment) Patient left: with call bell/phone within reach;with family/visitor present;in bed;with bed alarm set Nurse Communication: Mobility status PT Visit Diagnosis: Other abnormalities of gait and mobility (R26.89);Difficulty in walking, not elsewhere classified (R26.2);Muscle weakness (generalized) (M62.81)     Time: 7078-6754 PT Time Calculation (min) (ACUTE ONLY): 24 min  Charges:  $Therapeutic Activity: 23-37 mins                     Lieutenant Diego PT, DPT 3:46 PM,11/06/21

## 2021-11-06 NOTE — Progress Notes (Signed)
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  She was treated symptomatically, found to have candidemia and currently on IV Diflucan.  She was also found to have vocal cord paralysis and esophageal dysmotility with air-fluid levels.  Underwent left-sided thoracentesis for large pleural effusion removal of 500 cc of pleural fluid.  Cytology is negative for malignancy.  Remains in the hospital, did very debilitated and unable to tolerate oral medications.   Assessment & Plan:   Candidemia with Candida albicans, neutropenic fever: Port-A-Cath removed, culture with Candida albicans.  Repeat cultures cleared. TEE not performed because of severe risk with esophageal dysmotility. Patient not tolerating oral intake, ID following.  PICC line today, IV Diflucan at home.  Will need outpatient infusion set up as well as family to learn about medication administration.  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  Follow-up with outpatient oncology.  Seen by palliative care.  Currently desires full code.   Currently following up at [facility].  Repeat CT scan of the chest abdomen pelvis done on request of patient's daughter that shows increasing size of pulmonary metastasis, adenopathy in the abdomen but no other metastatic lesions.  Patient with severe debility and not eating well. I have suggested patient and family to talk to their oncologist about goal of care/benefits and side effects of  chemotherapy as well as prognosis.  Left pleural effusion: Negative for malignancy.  Pyuria with multiple drug-resistant E. coli: Suspected colonization.  Acute kidney injury Hypokalemia Hypomagnesemia Hypophosphatemia  Improved and adequate.  Advance physical debility and frailty: Working with PT OT.  Has adequate support system at home.  Plan to go home with outpatient PT OT.  Goal of care discussion: Multiple discussion with patient, husband and daughter.  Patient has vocal cord paralysis and also does not speak [place], used Marketing executive as well as daughter [name] on the speaker phone and husband who speaks limited English at the bedside. Every time it was asked, patient desires to continue chemotherapy. Family is asking to transfer care from [facility] to [facility] because they like to get chemotherapy here. Family also wondering about prognosis, this should be discussed with her oncologist when they go to visit next week. I advised to talk to oncology service at [facility] to transfer care back to [facility] because they need to make a referral.  Moderate protein calorie malnutrition: Significant reflux and dysphagia secondary to esophageal dysmotility. Nutrition Status: Nutrition Problem: Inadequate oral intake Etiology: poor appetite Signs/Symptoms: per patient/family report Interventions: MVI, Boost Breeze    Decision was made to get a PICC line and set up home infusion therapy daughter will learn to do daily injections of Diflucan.    DVT prophylaxis: SCDs Start: 10/28/21 2223   Code Status: Full code Family Communication: Multiple family members including husband and daughter. Disposition Plan: Status is: Inpatient Remains inpatient appropriate because: IV antibiotics     Consultants:  Infectious disease Palliative care  Procedures:  None  Antimicrobials:  Diflucan   Subjective:  Patient seen and examined.  I took a Marketing executive with me.   Husband at the bedside tells  me that the lady on the video does not translate well and does not understand patient so he would like me to use their daughter on the speaker phone. Case discussed in detail. Plan remains to get PICC line to go home and go back to [facility] to discuss about treatment plan as well as asked for referral to [place] center. Patient tells me that she has no issues other than nausea and does not have any appetite.  Objective: Vitals:   11/05/21 2035 11/06/21 0500 11/06/21 0517 11/06/21 0803  BP: (!) 153/87  (!) 158/93 (!) 153/82  Pulse: 93  97 98  Resp: '18  16 17  '$ Temp: 99.1 F (37.3 C)  99.6 F (37.6 C) 98.5 F (36.9 C)  TempSrc: Oral     SpO2: 95%  98% 96%  Weight:  73.1 kg    Height:        Intake/Output Summary (Last 24 hours) at 11/06/2021 1403 Last data filed at 11/06/2021 1019 Gross per 24 hour  Intake 2518 ml  Output 1550 ml  Net 968 ml   Filed Weights   10/28/21 1529 10/29/21 0159 11/06/21 0500  Weight: 77.1 kg 73 kg 73.1 kg    Examination:  General: Sick looking, flat affect.  Not in distress. On room air.  Dysphagia due to vocal cord paralysis. Cardiovascular: S1-S2 normal.  Regular rate rhythm. Respiratory: Bilateral clear. Gastrointestinal: Soft.  Nontender.  Obese and pendulous. Ext: No deformities. Neuro: Alert oriented.  Flat affect.  Anxious.    Data Reviewed: I have personally reviewed following labs and imaging studies  CBC: Recent Labs  Lab 10/30/21 1758 11/01/21 0416 11/03/21 0500 11/04/21 0502 11/05/21 0506  WBC 3.8* 4.7 2.8* 2.5* 2.9*  NEUTROABS 3.0 3.4 1.6*  --  1.9  HGB 7.9* 7.9* 7.7* 7.5* 7.8*  HCT 23.4* 24.0* 23.0* 23.2* 23.7*  MCV 91.1 93.0 93.5 94.7 94.8  PLT 39* 37* 33* 36* 41*   Basic Metabolic Panel: Recent Labs  Lab 10/31/21 0349 11/01/21 0416 11/03/21 0500 11/04/21 0502 11/05/21 0506  NA 136 135 135 135 135  K 3.8 3.6 3.3* 3.4* 3.9  CL 102 101 102 101 100  CO2 '27 28 29 29 29   '$ GLUCOSE 77 86 120* 98 108*  BUN '14 15 12 11 11  '$ CREATININE 0.94 0.90 0.86 0.91 1.01*  CALCIUM 8.3* 8.3* 8.0* 8.2* 8.5*  MG 1.7 2.1 1.4* 2.0  --   PHOS 2.3* 3.7 3.0  --   --    GFR: Estimated Creatinine Clearance: 57.3 mL/min (A) (by C-G formula based on SCr of 1.01 mg/dL (H)). Liver Function Tests: Recent Labs  Lab 11/04/21 0502  AST 28  ALT 23  ALKPHOS 80  BILITOT 0.7  PROT 5.4*  ALBUMIN 2.6*   No results for input(s): "LIPASE", "AMYLASE" in the last 168 hours. No results for input(s): "AMMONIA" in the last 168 hours. Coagulation Profile: No results for input(s): "INR", "PROTIME" in the last 168 hours. Cardiac Enzymes: No results for input(s): "CKTOTAL", "CKMB", "CKMBINDEX", "TROPONINI" in the last 168 hours. BNP (last 3 results) No results for input(s): "PROBNP" in the last 8760 hours. HbA1C: No results for input(s): "HGBA1C" in the last 72 hours. CBG: No results for input(s): "GLUCAP" in the last 168 hours. Lipid Profile: No results for input(s): "CHOL", "HDL", "LDLCALC", "TRIG", "CHOLHDL", "LDLDIRECT" in the last 72 hours. Thyroid Function Tests: No results for input(s): "TSH", "T4TOTAL", "FREET4", "T3FREE", "THYROIDAB" in the last 72 hours. Anemia Panel: No  results for input(s): "VITAMINB12", "FOLATE", "FERRITIN", "TIBC", "IRON", "RETICCTPCT" in the last 72 hours.  Sepsis Labs: No results for input(s): "PROCALCITON", "LATICACIDVEN" in the last 168 hours.  Recent Results (from the past 240 hour(s))  Urine Culture     Status: Abnormal   Collection Time: 10/28/21  3:30 PM   Specimen: Urine, Clean Catch  Result Value Ref Range Status   Specimen Description   Final    URINE, CLEAN CATCH Performed at [facility], [address], [place], [place] [address]    Special Requests   Final    NONE Performed at [facility], [address] [place], [place] [address]    Culture (A)  Final    >=100,000 COLONIES/mL ESCHERICHIA COLI Confirmed  Extended Spectrum Beta-Lactamase Producer (ESBL).  In bloodstream infections from ESBL organisms, carbapenems are preferred over piperacillin/tazobactam. They are shown to have a lower risk of mortality.    Report Status 10/31/2021 FINAL  Final   Organism ID, Bacteria ESCHERICHIA COLI (A)  Final      Susceptibility   Escherichia coli - MIC*    AMPICILLIN >=32 RESISTANT Resistant     CEFAZOLIN >=64 RESISTANT Resistant     CEFEPIME 16 RESISTANT Resistant     CEFTRIAXONE >=64 RESISTANT Resistant     CIPROFLOXACIN 2 RESISTANT Resistant     GENTAMICIN >=16 RESISTANT Resistant     IMIPENEM <=0.25 SENSITIVE Sensitive     NITROFURANTOIN 32 SENSITIVE Sensitive     TRIMETH/SULFA >=320 RESISTANT Resistant     AMPICILLIN/SULBACTAM >=32 RESISTANT Resistant     PIP/TAZO <=4 SENSITIVE Sensitive     * >=100,000 COLONIES/mL ESCHERICHIA COLI  SARS Coronavirus 2 by RT PCR (hospital order, performed in [place] hospital lab) *cepheid single result test* Anterior Nasal Swab     Status: None   Collection Time: 10/28/21  6:51 PM   Specimen: Anterior Nasal Swab  Result Value Ref Range Status   SARS Coronavirus 2 by RT PCR NEGATIVE NEGATIVE Final    Comment: (NOTE) SARS-CoV-2 target nucleic acids are NOT DETECTED.  The SARS-CoV-2 RNA is generally detectable in upper and lower respiratory specimens during the acute phase of infection. The lowest concentration of SARS-CoV-2 viral copies this assay can detect is 250 copies / mL. A negative result does not preclude SARS-CoV-2 infection and should not be used as the sole basis for treatment or other patient management decisions.  A negative result may occur with improper specimen collection / handling, submission of specimen other than nasopharyngeal swab, presence of viral mutation(s) within the areas targeted by this assay, and inadequate number of viral copies (<250 copies / mL). A negative result must be combined with clinical observations, patient  history, and epidemiological information.  Fact Sheet for Patients:   https://www.patel.info/  Fact Sheet for Healthcare Providers: https://hall.com/  This test is not yet approved or  cleared by the [place] FDA and has been authorized for detection and/or diagnosis of SARS-CoV-2 by FDA under an Emergency Use Authorization (EUA).  This EUA will remain in effect (meaning this test can be used) for the duration of the COVID-19 declaration under Section 564(b)(1) of the Act, 21 U.S.C. section 360bbb-3(b)(1), unless the authorization is terminated or revoked sooner.  Performed at [facility], [address] [place], [place] [address]   Blood culture (routine x 2)     Status: Abnormal (Preliminary result)   Collection Time: 10/28/21  8:25 PM   Specimen: BLOOD  Result Value Ref Range  Status   Specimen Description   Final    BLOOD LEFT ASSIST CONTROL Performed at [facility], [address] [place], [place] [address]    Special Requests   Final    BOTTLES DRAWN AEROBIC AND ANAEROBIC Blood Culture adequate volume Performed at [facility], [address] [place], [place] [address]    Culture  Setup Time   Final    YEAST IN BOTH AEROBIC AND ANAEROBIC BOTTLES CRITICAL VALUE NOTED.  VALUE IS CONSISTENT WITH PREVIOUSLY REPORTED AND CALLED VALUE. Performed at [facility], [address] [place], [place] [address]    Culture (A)  Final    CANDIDA ALBICANS Sent to [place] for further susceptibility testing. Performed at [facility], [place] [address], [place], [place] [address]    Report Status PENDING  Incomplete  Blood culture (routine x 2)     Status: Abnormal (Preliminary result)   Collection Time: 10/28/21  8:25 PM   Specimen: BLOOD  Result Value Ref Range Status   Specimen Description   Final    BLOOD PORTA CATH Performed at [facility], [address],  [place], [place] [address]    Special Requests   Final    BOTTLES DRAWN AEROBIC AND ANAEROBIC Blood Culture adequate volume Performed at [facility], [address] [place], [place] [address]    Culture  Setup Time   Final    Organism ID to follow IN BOTH AEROBIC AND ANAEROBIC BOTTLES YEAST CRITICAL RESULT CALLED TO, READ BACK BY AND VERIFIED WITH: Carey Bullocks 10/29/2021 at 1137 SRR Performed at [facility], [address] [place], [place] [address]    Culture (A)  Final    CANDIDA ALBICANS Sent to [place] for further susceptibility testing. Performed at [facility], [place] [address] [place], [place] [address]    Report Status PENDING  Incomplete  Blood Culture ID Panel (Reflexed)     Status: Abnormal   Collection Time: 10/28/21  8:25 PM  Result Value Ref Range Status   Enterococcus faecalis NOT DETECTED NOT DETECTED Final   Enterococcus Faecium NOT DETECTED NOT DETECTED Final   Listeria monocytogenes NOT DETECTED NOT DETECTED Final   Staphylococcus species NOT DETECTED NOT DETECTED Final   Staphylococcus aureus (BCID) NOT DETECTED NOT DETECTED Final   Staphylococcus epidermidis NOT DETECTED NOT DETECTED Final   Staphylococcus lugdunensis NOT DETECTED NOT DETECTED Final   Streptococcus species NOT DETECTED NOT DETECTED Final   Streptococcus agalactiae NOT DETECTED NOT DETECTED Final   Streptococcus pneumoniae NOT DETECTED NOT DETECTED Final   Streptococcus pyogenes NOT DETECTED NOT DETECTED Final   A.calcoaceticus-baumannii NOT DETECTED NOT DETECTED Final   Bacteroides fragilis NOT DETECTED NOT DETECTED Final   Enterobacterales NOT DETECTED NOT DETECTED Final   Enterobacter cloacae complex NOT DETECTED NOT DETECTED Final   Escherichia coli NOT DETECTED NOT DETECTED Final   Klebsiella aerogenes NOT DETECTED NOT DETECTED Final   Klebsiella oxytoca NOT DETECTED NOT DETECTED Final   Klebsiella pneumoniae NOT DETECTED NOT DETECTED Final   Proteus species  NOT DETECTED NOT DETECTED Final   Salmonella species NOT DETECTED NOT DETECTED Final   Serratia marcescens NOT DETECTED NOT DETECTED Final   Haemophilus influenzae NOT DETECTED NOT DETECTED Final   Neisseria meningitidis NOT DETECTED NOT DETECTED Final   Pseudomonas aeruginosa NOT DETECTED NOT DETECTED Final   Stenotrophomonas maltophilia NOT DETECTED NOT DETECTED Final   Candida albicans DETECTED (A) NOT DETECTED Final    Comment:  CRITICAL RESULT CALLED TO, READ BACK BY AND VERIFIED WITH: Carey Bullocks 10/29/2021 at 1137 SRR    Candida auris NOT DETECTED NOT DETECTED Final   Candida glabrata NOT DETECTED NOT DETECTED Final   Candida krusei NOT DETECTED NOT DETECTED Final   Candida parapsilosis NOT DETECTED NOT DETECTED Final   Candida tropicalis NOT DETECTED NOT DETECTED Final   Cryptococcus neoformans/gattii NOT DETECTED NOT DETECTED Final    Comment: Performed at [facility], [address] [place], [place] [address]  Antifungal AST 9 Drug Panel     Status: None   Collection Time: 10/28/21  8:25 PM  Result Value Ref Range Status   Organism ID, Yeast Candida albicans  Corrected    Comment: (NOTE) Identification performed by account, not confirmed by this laboratory. CORRECTED ON 07/13 AT 0636: PREVIOUSLY REPORTED AS Preliminary report    Amphotericin B MIC 0.5 ug/mL  Final    Comment: (NOTE) Breakpoints have been established for only some organism-drug combinations as indicated. This test was developed and its performance characteristics determined by [facility]. It has not been cleared or approved by the Food and Drug Administration.    Anidulafungin MIC Comment  Final    Comment: (NOTE) 0.03 ug/mL Susceptible Breakpoints have been established for only some organism-drug combinations as indicated. This test was developed and its performance characteristics determined by [facility]. It has not been cleared or approved by the Food and Drug Administration.     Caspofungin MIC Comment  Final    Comment: (NOTE) 0.06 ug/mL Susceptible Breakpoints have been established for only some organism-drug combinations as indicated. This test was developed and its performance characteristics determined by [facility]. It has not been cleared or approved by the Food and Drug Administration.    Micafungin MIC Comment  Final    Comment: (NOTE) 0.016 ug/mL Susceptible Breakpoints have been established for only some organism-drug combinations as indicated. This test was developed and its performance characteristics determined by [facility]. It has not been cleared or approved by the Food and Drug Administration.    Posaconazole MIC 0.06 ug/mL  Final    Comment: (NOTE) Breakpoints have been established for only some organism-drug combinations as indicated. This test was developed and its performance characteristics determined by [facility]. It has not been cleared or approved by the Food and Drug Administration.    Fluconazole Islt MIC 0.5 ug/mL Susceptible  Final    Comment: (NOTE) Breakpoints have been established for only some organism-drug combinations as indicated. This test was developed and its performance characteristics determined by [facility]. It has not been cleared or approved by the Food and Drug Administration.    Flucytosine MIC 0.12 ug/mL  Final    Comment: (NOTE) Breakpoints have been established for only some organism-drug combinations as indicated. This test was developed and its performance characteristics determined by [facility]. It has not been cleared or approved by the Food and Drug Administration.    Itraconazole MIC 0.12 ug/mL  Final    Comment: (NOTE) Breakpoints have been established for only some organism-drug combinations as indicated. This test was developed and its performance characteristics determined by [facility]. It has not been cleared or approved by the Food and Drug Administration.    Voriconazole MIC Comment  Final     Comment: (NOTE) 0.016 ug/mL Susceptible Breakpoints have been established for only some organism-drug combinations as indicated. This test was developed and its performance characteristics determined by [facility]. It has not been cleared or approved by the Food and Drug Administration.  Performed At: [facility] [place], [place] [id number] Rush Farmer MD HY:[id number]    Source BLOOD  Final    Comment: Performed at [facility], [place] [address], [place], [place] [address]  MRSA Next Gen by PCR, Nasal     Status: None   Collection Time: 10/29/21 12:11 PM   Specimen: Nasal Mucosa; Nasal Swab  Result Value Ref Range Status   MRSA by PCR Next Gen NOT DETECTED NOT DETECTED Final    Comment: (NOTE) The GeneXpert MRSA Assay (FDA approved for NASAL specimens only), is one component of a comprehensive MRSA colonization surveillance program. It is not intended to diagnose MRSA infection nor to guide or monitor treatment for MRSA infections. Test performance is not FDA approved in patients less than 96 years old. Performed at [facility], [address] [place], [place] [address]   Culture, blood (Routine X 2) w Reflex to ID Panel     Status: None   Collection Time: 10/29/21  2:41 PM   Specimen: BLOOD  Result Value Ref Range Status   Specimen Description BLOOD RIGHT ANTECUBITAL  Final   Special Requests   Final    BOTTLES DRAWN AEROBIC AND ANAEROBIC Blood Culture adequate volume   Culture   Final    NO GROWTH 5 DAYS Performed at [facility], [address] [place], [place] [address]    Report Status 11/03/2021 FINAL  Final  Culture, blood (Routine X 2) w Reflex to ID Panel     Status: None   Collection Time: 10/29/21  3:00 PM   Specimen: BLOOD  Result Value Ref Range Status   Specimen Description BLOOD LEFT ANTECUBITAL  Final   Special Requests   Final    BOTTLES DRAWN AEROBIC AND ANAEROBIC Blood Culture adequate volume   Culture    Final    NO GROWTH 5 DAYS Performed at [facility], [address] [place], [place] [address]    Report Status 11/03/2021 FINAL  Final  Culture, blood (Routine X 2) w Reflex to ID Panel     Status: None   Collection Time: 10/30/21  5:14 AM   Specimen: BLOOD  Result Value Ref Range Status   Specimen Description BLOOD LEFT [facility]  Final   Special Requests   Final    BOTTLES DRAWN AEROBIC AND ANAEROBIC Blood Culture adequate volume   Culture   Final    NO GROWTH 5 DAYS Performed at [facility], [address], [place], [place] [address]    Report Status 11/04/2021 FINAL  Final  Culture, blood (Routine X 2) w Reflex to ID Panel     Status: None   Collection Time: 10/30/21  5:15 AM   Specimen: BLOOD  Result Value Ref Range Status   Specimen Description BLOOD LEFT HAND  Final   Special Requests   Final    BOTTLES DRAWN AEROBIC AND ANAEROBIC Blood Culture adequate volume   Culture   Final    NO GROWTH 5 DAYS Performed at [facility], [address] [place],  [address]    Report Status 11/04/2021 FINAL  Final  Cath Tip Culture     Status: Abnormal   Collection Time: 10/30/21  3:51 PM   Specimen: Catheter Tip  Result Value Ref Range Status   Specimen Description CATH TIP  Final   Special Requests   Final    CHEST PORT CATH TIP Performed at [facility], [place]  [address] [place], [place] [address]    Culture >=100,000 COLONIES/mL CANDIDA ALBICANS (A)  Final   Report Status 11/02/2021 FINAL  Final  Body fluid culture w Gram Stain     Status: None   Collection Time: 10/31/21 12:25 PM   Specimen: PATH Cytology Pleural fluid  Result Value Ref Range Status   Specimen Description   Final    PLEURAL Performed at [facility], [address] [place], [place] [address]    Special Requests   Final    NONE Performed at [facility], [place], [place] [address]    Gram Stain NO WBC SEEN NO ORGANISMS SEEN    Final   Culture   Final    NO GROWTH 3 DAYS Performed at [facility], [place] [address], [place], [place] [address]    Report Status 11/04/2021 FINAL  Final         Radiology Studies: [place] EKG SITE RITE  Result Date: 11/06/2021 If Site Rite image not attached, placement could not be confirmed due to current cardiac rhythm.  CT ABDOMEN PELVIS W CONTRAST  Result Date: 11/05/2021 CLINICAL DATA:  Metastatic cervical cancer re-evaluation. EXAM: CT ABDOMEN AND PELVIS WITH IV AND ORAL CONTRAST TECHNIQUE: Multidetector CT imaging of the abdomen and pelvis was performed after oral contrast administration using the standard protocol following bolus administration of intravenous contrast. RADIATION DOSE REDUCTION: This exam was performed according to the departmental dose-optimization program which includes automated exposure control, adjustment of the mA and/or kV according to patient size and/or use of iterative reconstruction technique. CONTRAST:  175m OMNIPAQUE IOHEXOL 300 MG/ML  SOLN COMPARISON:  CT abdomen and pelvis with IV contrast 08/10/2021, CT chest, abdomen and pelvis with IV and oral contrast 06/03/2021. FINDINGS: Lower chest: Interval new presumed metastatic nodules noted in the right lower lobe on series 3 axial image 10 (3 mm), image 12 (6 mm), and image 21 (5 mm). Stable 7 mm fissural right infrahilar lung nodule again on axial image 9. Anteriorly in the right middle lobe there is a 1 cm nodule on image 3 which is new, and interval enlargement of a now 1.1 cm nodule on image 1 which previously 4 mm. A 1 cm fissural right lung base nodule on image 4 was previously 4 mm as well. Marked elevation of the left hemidiaphragm is again noted consistent with eventration or paresis with left lung base mostly excluded from the exam aside from visualization of at least a small sized left pleural effusion, possibly larger based on the most recent portable chest 10/31/2021. There is a patulous distal  thoracic esophagus with retained versus refluxed contrast within. Small hiatal hernia. There is mild cardiomegaly with no pericardial effusion. Hepatobiliary: Limited liver visualization due to breathing motion. There is no obvious mass enhancement. The liver is mildly steatotic as before. There are multiple calcified gallstones up to 1.2 cm in size but no wall thickening or biliary dilatation. Pancreas: No focal abnormality or inflammatory change. Spleen: No mass or splenomegaly. Adrenals/Urinary Tract: There is no adrenal mass. There are a few too small to characterize hypodensities in the left kidney which are statistically most likely cysts. Right renal cortex is unremarkable. There is no urinary stone or obstruction. There is no bladder thickening or bladder stone. Stomach/Bowel: No dilatation or wall thickening including the appendix. Scattered uncomplicated colonic diverticula. Mild fecal stasis. Vascular/Lymphatic: Minimal aortic atherosclerosis. Anterior and left para-aortic soft tissue fullness shows improvement. For example soft tissue thickening  anterior to the aorta previously measured 1.3 cm in thickness is now no more than 0.8 cm in thickness (series 2 axial 37). Left para-aortic soft tissue fullness was previously 1.2 cm in short axis thickness now 0.9 cm (series 2 axial 46). No pelvic or further abdominal adenopathy is seen. Reproductive: Uterus and bilateral adnexa are unremarkable. Other: There is no incarcerated hernia, no free air, hemorrhage or free fluid. There is a subcutaneous rounded low-density lesion in the left anterolateral abdominal wall measuring 1.4 cm and 0.9 Hounsfield units on 2:38 which could be evidence of recent injection or could be a subcutaneous metastatic deposit. If this is a subcutaneous metastasis I do not see any others. Musculoskeletal: Compression fractures of the L2 lower plate and L4 upper plate are slightly worsened in the interval since 08/10/2021 but there is no  new compression fracture. Other visualized vertebra are normal in heights with osteopenia. No destructive bone lesion is seen. Sclerosis of the sacrum alongside the SI joints is again noted consistent with a healed or mostly healed sacral insufficiency fracture and was seen on the prior study. Trabecular coarsening again noted of the superior left acetabular region and left femoral head again is seen, could be due to hemangiomatous involvement or Paget's disease of bone but does not have the typical appearance of metastatic disease. IMPRESSION: 1. New and enlarging metastatic nodules in the right middle and lower lobes. 2. Improvement in periaortic adenopathy.  No new adenopathy. 3. Interval mild worsening of compression fractures at L2 and 4. Osteopenia. 4. 1.4 cm low-density subcutaneous rounded nodule or injection site anterolateral left mid abdominal wall. Subcutaneous metastasis is not excluded but no other subcutaneous abnormality is seen elsewhere. 5. Again noted trabecular coarsening in the superior left acetabulum and left femoral head which could be due to hemangiomatous involvement or Paget's disease, but does not have the typical appearance of bone metastases. 6. Healed or mostly healed sacral insufficiency fracture versus chronic stress reaction. 7. Cholelithiasis and additional chronic changes. 8. Aortic atherosclerosis. 9. Patulous distal esophagus containing refluxed or retained contrast. Electronically Signed   By: Telford Nab M.D.   On: 11/05/2021 20:33        Scheduled Meds:  LORazepam  0.5 mg Oral TID   multivitamin with minerals  1 tablet Oral Daily   mouth rinse  15 mL Mouth Rinse 4 times per day   Ensure Max Protein  11 oz Oral TID   scopolamine  1 patch Transdermal Q72H   Continuous Infusions:  sodium chloride 100 mL/hr at 11/06/21 1019   fluconazole (DIFLUCAN) IV Stopped (11/05/21 1752)   promethazine (PHENERGAN) injection (IM or IVPB) 12.5 mg (11/06/21 1340)     LOS: 9  days    Time spent: 35 minutes    Barb Merino, MD [facility] Pager [phone]

## 2021-11-06 NOTE — Treatment Plan (Signed)
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

## 2021-11-06 NOTE — Progress Notes (Signed)
Peripherally Inserted Central Catheter Placement  The IV Nurse has discussed with the patient and/or persons authorized to consent for the patient, the purpose of this procedure and the potential benefits and risks involved with this procedure.  The benefits include less needle sticks, lab draws from the catheter, and the patient may be discharged home with the catheter. Risks include, but not limited to, infection, bleeding, blood clot (thrombus formation), and puncture of an artery; nerve damage and irregular heartbeat and possibility to perform a PICC exchange if needed/ordered by physician.  Alternatives to this procedure were also discussed.  Bard Power PICC patient education guide, fact sheet on infection prevention and patient information card has been provided to patient /or left at bedside.    PICC Placement Documentation        Jill Rodriguez 11/06/2021, 3:05 PM

## 2021-11-06 NOTE — TOC Progression Note (Signed)
Transition of Care [facility]) - Progression Note    Patient Details  Name: Jill Rodriguez MRN: [id number] Date of Birth: 1960-07-22  Transition of Care [facility]) CM/SW Howell, RN Phone Number: 11/06/2021, 4:08 PM  Clinical Narrative:   Patient will need IV fluconazole at discharge.  Carolynn Sayers at [facility] aware, she is attempting to locate an agency to accept patient for services.         Expected Discharge Plan and Services                                                 Social Determinants of Health (SDOH) Interventions    Readmission Risk Interventions     No data to display

## 2021-11-07 DIAGNOSIS — T451X5A Adverse effect of antineoplastic and immunosuppressive drugs, initial encounter: Secondary | ICD-10-CM | POA: Diagnosis not present

## 2021-11-07 DIAGNOSIS — R112 Nausea with vomiting, unspecified: Secondary | ICD-10-CM | POA: Diagnosis not present

## 2021-11-07 MED ORDER — FLUCONAZOLE IV FOR PTA / DISCHARGE USE ONLY): 400.0000 mg | INTRAVENOUS | 0 refills | Status: AC

## 2021-11-07 MED ORDER — SODIUM CHLORIDE 0.9% FLUSH: 10.0000 mL | INTRAVENOUS | Status: DC | PRN

## 2021-11-07 MED ORDER — SODIUM CHLORIDE 0.9% FLUSH
10.0000 mL | Freq: Two times a day (BID) | INTRAVENOUS | Status: DC
  Administered 2021-11-07 – 2021-11-10 (×6): 10 mL

## 2021-11-07 MED ORDER — CHLORHEXIDINE GLUCONATE CLOTH 2 % EX PADS
6.0000 | MEDICATED_PAD | Freq: Every day | CUTANEOUS | Status: DC
  Administered 2021-11-07 – 2021-11-10 (×4): 6 via TOPICAL

## 2021-11-07 NOTE — Progress Notes (Signed)
Initially, called to bedside by PICC nurse for uncontrolled bleeding at site of newly placed PICC. Arm elevated with ice and gauze dressing. On reassessment, dressing saturated, Surgicel hemostat gauze placed to assist with clotting. Dressing changed by PICC nurse. No bleeding noted at the site. Patient last PLT count 41. Notified on call provider Dr. Sidney Ace of above. Warm compress applied. Will continue to monitor site for bleeding.

## 2021-11-07 NOTE — Progress Notes (Signed)
Peripherally Inserted Central Catheter Placement  The IV Nurse has discussed with the patient and/or persons authorized to consent for the patient, the purpose of this procedure and the potential benefits and risks involved with this procedure.  The benefits include less needle sticks, lab draws from the catheter, and the patient may be discharged home with the catheter. Risks include, but not limited to, infection, bleeding, blood clot (thrombus formation), and puncture of an artery; nerve damage and irregular heartbeat and possibility to perform a PICC exchange if needed/ordered by physician.  Alternatives to this procedure were also discussed.  Bard Power PICC patient education guide, fact sheet on infection prevention and patient information card has been provided to patient /or left at bedside.    PICC Placement Documentation  PICC Single Lumen 11/06/21 Left Brachial 39 cm 1 cm (Active)  Indication for Insertion or Continuance of Line Home intravenous therapies (PICC only) 11/07/21 0003  Exposed Catheter (cm) 1 cm 11/07/21 0003  Site Assessment Clean;Intact 11/07/21 0003  Line Status Flushed;Blood return noted;Saline locked 11/07/21 0003  Dressing Type Transparent;Securing device 11/07/21 0003  Dressing Status Antimicrobial disc in place;Clean, Dry, Intact 11/07/21 0003  Safety Lock Not Applicable 61/68/37 2902  Line Care Connections checked and tightened 11/07/21 0003  Line Adjustment (NICU/IV Team Only) No 11/07/21 0003  Dressing Intervention New dressing 11/07/21 0003  Dressing Change Due 11/14/21 11/07/21 0003       Jill Rodriguez, Cathlyn Parsons 11/07/2021, 12:16 AM

## 2021-11-07 NOTE — Progress Notes (Signed)
Bleeding was noted at catheter site after PICC placed. Dressing reinforced, ice pack applied and elevated lt arm. Reassessment done and no more bleeding noted to picc site at this time but a little bit of swelling above the site. Dressing changed and site was clean, dry and intact. PICC line flushes well and with good blood return. RN unit, Larene Beach was aware and she notified MD on call. Will continue to monitor.

## 2021-11-07 NOTE — Progress Notes (Signed)
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  She was treated symptomatically, found to have candidemia and currently on IV Diflucan.  She was also found to have vocal cord paralysis and esophageal dysmotility with air-fluid levels.  Underwent left-sided thoracentesis for large pleural effusion removal of 500 cc of pleural fluid.  Cytology is negative for malignancy.  Remained in the hospital, very debilitated and unable to tolerate oral medications. 7/13, PICC line and home infusion therapy planned.   Assessment & plan of care:   Candidemia with Candida albicans, neutropenic fever: Port-A-Cath removed, culture with Candida albicans.  Repeat cultures cleared. TEE not performed because of severe risk with esophageal dysmotility. PICC line, IV Diflucan at home until 8/17.  Will need outpatient infusion set up as well as family to learn about medication administration.  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  Follow-up with outpatient oncology.  Seen by palliative care.  Currently desires full code.   Currently following up at [facility].  Repeat CT scan of the chest abdomen pelvis done on request of patient's daughter that shows increasing size of pulmonary metastasis, adenopathy in the abdomen but no other metastatic lesions.  Patient with severe debility and not eating well. I have suggested patient and family to talk to their oncologist about goal of care/benefits and side effects  of chemotherapy as well as prognosis.  Left pleural effusion: Negative for malignancy.  Pyuria with multiple drug-resistant E. coli: Suspected colonization.  Acute kidney injury Hypokalemia Hypomagnesemia Hypophosphatemia: Improved and adequate.  Advance physical debility and frailty: Working with PT OT.  Has adequate support system at home.  Plan to go home with outpatient PT OT.  Goal of care discussion: Multiple discussion with patient, husband and daughter.  Patient has vocal cord paralysis and also does not speak [place], used Marketing executive as well as daughter [name] on the speaker phone and husband who speaks limited English at the bedside. Every time it was asked, patient desires to continue chemotherapy. Family is asking to transfer care from [facility] to [place] because they like to get chemotherapy here. Family also wondering about prognosis, this should be discussed with her oncologist when they go to visit next week. I advised to talk to oncology service at [facility] to transfer care back to [facility] because they need to make a referral.  Moderate protein calorie malnutrition: Significant reflux and dysphagia secondary to esophageal dysmotility. Nutrition Status: Nutrition Problem: Inadequate oral intake Etiology: poor appetite Signs/Symptoms: per patient/family report Interventions: MVI, Boost Breeze    Decision was made to get a PICC line and set up home infusion therapy daughter will learn to do daily injections of Diflucan.  Stable for discharge after home infusion set up.    DVT prophylaxis: SCDs Start: 10/28/21 2223   Code Status: Full code Family Communication: Husband at bedside. Disposition Plan: Status is: Inpatient Remains inpatient appropriate because: IV antibiotics     Consultants:  Infectious disease Palliative care  Procedures:  None  Antimicrobials:  Diflucan   Subjective:  No events today.  Does not have any  appetite.  Objective:  Vitals:   11/06/21 1637 11/06/21 2357 11/07/21 0420 11/07/21 0847  BP: (!) 165/99 (!) 156/87 (!) 149/86 (!) 153/83  Pulse: 97 91 88 95  Resp: '16 18 17 15  '$ Temp: 99.4 F (37.4 C) 98.6 F (37 C) 98.8 F (37.1 C) 98.8 F (37.1 C)  TempSrc:      SpO2: 94% 96% 96% 91%  Weight:      Height:        Intake/Output Summary (Last 24 hours) at 11/07/2021 1127 Last data filed at 11/07/2021 1015 Gross per 24 hour  Intake 1562.81 ml  Output --  Net 1562.81 ml    Filed Weights   10/28/21 1529 10/29/21 0159 11/06/21 0500  Weight: 77.1 kg 73 kg 73.1 kg    Examination:  General: Sick looking, flat affect.  Not in distress.  Pleasant to conversation today. On room air.  Dysphonia but able to speak. Cardiovascular: S1-S2 normal.  Regular rate rhythm. Respiratory: Bilateral clear. Gastrointestinal: Soft.  Nontender.  Obese and pendulous. Ext: No deformities. Neuro: Alert oriented.  Flat affect.      Data Reviewed: I have personally reviewed following labs and imaging studies  CBC: Recent Labs  Lab 11/01/21 0416 11/03/21 0500 11/04/21 0502 11/05/21 0506  WBC 4.7 2.8* 2.5* 2.9*  NEUTROABS 3.4 1.6*  --  1.9  HGB 7.9* 7.7* 7.5* 7.8*  HCT 24.0* 23.0* 23.2* 23.7*  MCV 93.0 93.5 94.7 94.8  PLT 37* 33* 36* 41*    Basic Metabolic Panel: Recent Labs  Lab 11/01/21 0416 11/03/21 0500 11/04/21 0502 11/05/21 0506  NA 135 135 135 135  K 3.6 3.3* 3.4* 3.9  CL 101 102 101 100  CO2 '28 29 29 29  '$ GLUCOSE 86 120* 98 108*  BUN '15 12 11 11  '$ CREATININE 0.90 0.86 0.91 1.01*  CALCIUM 8.3* 8.0* 8.2* 8.5*  MG 2.1 1.4* 2.0  --   PHOS 3.7 3.0  --   --     GFR: Estimated Creatinine Clearance: 57.3 mL/min (A) (by C-G formula based on SCr of 1.01 mg/dL (H)). Liver Function Tests: Recent Labs  Lab 11/04/21 0502  AST 28  ALT 23  ALKPHOS 80  BILITOT 0.7  PROT 5.4*  ALBUMIN 2.6*    No results for input(s): "LIPASE", "AMYLASE" in the last 168 hours. No results  for input(s): "AMMONIA" in the last 168 hours. Coagulation Profile: No results for input(s): "INR", "PROTIME" in the last 168 hours. Cardiac Enzymes: No results for input(s): "CKTOTAL", "CKMB", "CKMBINDEX", "TROPONINI" in the last 168 hours. BNP (last 3 results) No results for input(s): "PROBNP" in the last 8760 hours. HbA1C: No results for input(s): "HGBA1C" in the last 72 hours. CBG: No results for input(s): "GLUCAP" in the last 168 hours. Lipid Profile: No results for input(s): "CHOL", "HDL", "LDLCALC", "TRIG", "CHOLHDL", "LDLDIRECT" in the last 72 hours. Thyroid Function Tests: No results for input(s): "TSH", "T4TOTAL", "FREET4", "T3FREE", "THYROIDAB" in the last 72 hours. Anemia Panel: No results for input(s): "VITAMINB12", "FOLATE", "FERRITIN", "TIBC", "IRON", "RETICCTPCT" in the last 72 hours.  Sepsis Labs: No results for input(s): "PROCALCITON", "LATICACIDVEN" in the last 168 hours.  Recent Results (from the past 240 hour(s))  Urine Culture     Status: Abnormal   Collection Time: 10/28/21  3:30 PM   Specimen: Urine, Clean Catch  Result Value Ref Range Status   Specimen Description   Final    URINE, CLEAN CATCH Performed at [facility], [address] [place], [place] [address]  Special Requests   Final    NONE Performed at [facility], [address] [place], [place] [address]    Culture (A)  Final    >=100,000 COLONIES/mL ESCHERICHIA COLI Confirmed Extended Spectrum Beta-Lactamase Producer (ESBL).  In bloodstream infections from ESBL organisms, carbapenems are preferred over piperacillin/tazobactam. They are shown to have a lower risk of mortality.    Report Status 10/31/2021 FINAL  Final   Organism ID, Bacteria ESCHERICHIA COLI (A)  Final      Susceptibility   Escherichia coli - MIC*    AMPICILLIN >=32 RESISTANT Resistant     CEFAZOLIN >=64 RESISTANT Resistant     CEFEPIME 16 RESISTANT Resistant     CEFTRIAXONE >=64 RESISTANT Resistant      CIPROFLOXACIN 2 RESISTANT Resistant     GENTAMICIN >=16 RESISTANT Resistant     IMIPENEM <=0.25 SENSITIVE Sensitive     NITROFURANTOIN 32 SENSITIVE Sensitive     TRIMETH/SULFA >=320 RESISTANT Resistant     AMPICILLIN/SULBACTAM >=32 RESISTANT Resistant     PIP/TAZO <=4 SENSITIVE Sensitive     * >=100,000 COLONIES/mL ESCHERICHIA COLI  SARS Coronavirus 2 by RT PCR (hospital order, performed in [place] hospital lab) *cepheid single result test* Anterior Nasal Swab     Status: None   Collection Time: 10/28/21  6:51 PM   Specimen: Anterior Nasal Swab  Result Value Ref Range Status   SARS Coronavirus 2 by RT PCR NEGATIVE NEGATIVE Final    Comment: (NOTE) SARS-CoV-2 target nucleic acids are NOT DETECTED.  The SARS-CoV-2 RNA is generally detectable in upper and lower respiratory specimens during the acute phase of infection. The lowest concentration of SARS-CoV-2 viral copies this assay can detect is 250 copies / mL. A negative result does not preclude SARS-CoV-2 infection and should not be used as the sole basis for treatment or other patient management decisions.  A negative result may occur with improper specimen collection / handling, submission of specimen other than nasopharyngeal swab, presence of viral mutation(s) within the areas targeted by this assay, and inadequate number of viral copies (<250 copies / mL). A negative result must be combined with clinical observations, patient history, and epidemiological information.  Fact Sheet for Patients:   https://www.patel.info/  Fact Sheet for Healthcare Providers: https://hall.com/  This test is not yet approved or  cleared by the [place] FDA and has been authorized for detection and/or diagnosis of SARS-CoV-2 by FDA under an Emergency Use Authorization (EUA).  This EUA will remain in effect (meaning this test can be used) for the duration of the COVID-19 declaration under  Section 564(b)(1) of the Act, 21 U.S.C. section 360bbb-3(b)(1), unless the authorization is terminated or revoked sooner.  Performed at [facility], [address] [place], [place] [address]   Blood culture (routine x 2)     Status: Abnormal (Preliminary result)   Collection Time: 10/28/21  8:25 PM   Specimen: BLOOD  Result Value Ref Range Status   Specimen Description   Final    BLOOD LEFT ASSIST CONTROL Performed at [facility], [address] [place], [place] [address]    Special Requests   Final    BOTTLES DRAWN AEROBIC AND ANAEROBIC Blood Culture adequate volume Performed at [facility], [address] [place], [place] [address]    Culture  Setup Time   Final    YEAST IN BOTH AEROBIC AND ANAEROBIC BOTTLES CRITICAL VALUE NOTED.  VALUE IS CONSISTENT WITH PREVIOUSLY REPORTED AND CALLED VALUE. Performed  at [facility], [address], [place], [place] [address]    Culture (A)  Final    CANDIDA ALBICANS Sent to [place] for further susceptibility testing. Performed at [facility], [place] [address] [place], [place] [address]    Report Status PENDING  Incomplete  Blood culture (routine x 2)     Status: Abnormal (Preliminary result)   Collection Time: 10/28/21  8:25 PM   Specimen: BLOOD  Result Value Ref Range Status   Specimen Description   Final    BLOOD PORTA CATH Performed at [facility], [address] [place], [place] [address]    Special Requests   Final    BOTTLES DRAWN AEROBIC AND ANAEROBIC Blood Culture adequate volume Performed at [facility], [address] [place], [place] [address]    Culture  Setup Time   Final    Organism ID to follow IN BOTH AEROBIC AND ANAEROBIC BOTTLES YEAST CRITICAL RESULT CALLED TO, READ BACK BY AND VERIFIED WITH: Carey Bullocks 10/29/2021 at 1137 SRR Performed at [facility], [address] [place], [place] [address]    Culture (A)  Final     CANDIDA ALBICANS Sent to [place] for further susceptibility testing. Performed at [facility], [place] [address], [place], [place] [address]    Report Status PENDING  Incomplete  Blood Culture ID Panel (Reflexed)     Status: Abnormal   Collection Time: 10/28/21  8:25 PM  Result Value Ref Range Status   Enterococcus faecalis NOT DETECTED NOT DETECTED Final   Enterococcus Faecium NOT DETECTED NOT DETECTED Final   Listeria monocytogenes NOT DETECTED NOT DETECTED Final   Staphylococcus species NOT DETECTED NOT DETECTED Final   Staphylococcus aureus (BCID) NOT DETECTED NOT DETECTED Final   Staphylococcus epidermidis NOT DETECTED NOT DETECTED Final   Staphylococcus lugdunensis NOT DETECTED NOT DETECTED Final   Streptococcus species NOT DETECTED NOT DETECTED Final   Streptococcus agalactiae NOT DETECTED NOT DETECTED Final   Streptococcus pneumoniae NOT DETECTED NOT DETECTED Final   Streptococcus pyogenes NOT DETECTED NOT DETECTED Final   A.calcoaceticus-baumannii NOT DETECTED NOT DETECTED Final   Bacteroides fragilis NOT DETECTED NOT DETECTED Final   Enterobacterales NOT DETECTED NOT DETECTED Final   Enterobacter cloacae complex NOT DETECTED NOT DETECTED Final   Escherichia coli NOT DETECTED NOT DETECTED Final   Klebsiella aerogenes NOT DETECTED NOT DETECTED Final   Klebsiella oxytoca NOT DETECTED NOT DETECTED Final   Klebsiella pneumoniae NOT DETECTED NOT DETECTED Final   Proteus species NOT DETECTED NOT DETECTED Final   Salmonella species NOT DETECTED NOT DETECTED Final   Serratia marcescens NOT DETECTED NOT DETECTED Final   Haemophilus influenzae NOT DETECTED NOT DETECTED Final   Neisseria meningitidis NOT DETECTED NOT DETECTED Final   Pseudomonas aeruginosa NOT DETECTED NOT DETECTED Final   Stenotrophomonas maltophilia NOT DETECTED NOT DETECTED Final   Candida albicans DETECTED (A) NOT DETECTED Final    Comment: CRITICAL RESULT CALLED TO, READ BACK BY AND VERIFIED WITH: Carey Bullocks 10/29/2021 at 1137 SRR    Candida auris NOT DETECTED NOT DETECTED Final   Candida glabrata NOT DETECTED NOT DETECTED Final   Candida krusei NOT DETECTED NOT DETECTED Final   Candida parapsilosis NOT DETECTED NOT DETECTED Final   Candida tropicalis NOT DETECTED NOT DETECTED Final   Cryptococcus neoformans/gattii NOT DETECTED NOT DETECTED Final    Comment: Performed at [facility], [address] [place], [place] [address]  Antifungal AST 9 Drug Panel  Status: None   Collection Time: 10/28/21  8:25 PM  Result Value Ref Range Status   Organism ID, Yeast Candida albicans  Corrected    Comment: (NOTE) Identification performed by account, not confirmed by this laboratory. CORRECTED ON 07/13 AT 0636: PREVIOUSLY REPORTED AS Preliminary report    Amphotericin B MIC 0.5 ug/mL  Final    Comment: (NOTE) Breakpoints have been established for only some organism-drug combinations as indicated. This test was developed and its performance characteristics determined by [facility]. It has not been cleared or approved by the Food and Drug Administration.    Anidulafungin MIC Comment  Final    Comment: (NOTE) 0.03 ug/mL Susceptible Breakpoints have been established for only some organism-drug combinations as indicated. This test was developed and its performance characteristics determined by [facility]. It has not been cleared or approved by the Food and Drug Administration.    Caspofungin MIC Comment  Final    Comment: (NOTE) 0.06 ug/mL Susceptible Breakpoints have been established for only some organism-drug combinations as indicated. This test was developed and its performance characteristics determined by [facility]. It has not been cleared or approved by the Food and Drug Administration.    Micafungin MIC Comment  Final    Comment: (NOTE) 0.016 ug/mL Susceptible Breakpoints have been established for only some organism-drug combinations as indicated. This test was  developed and its performance characteristics determined by [facility]. It has not been cleared or approved by the Food and Drug Administration.    Posaconazole MIC 0.06 ug/mL  Final    Comment: (NOTE) Breakpoints have been established for only some organism-drug combinations as indicated. This test was developed and its performance characteristics determined by [facility]. It has not been cleared or approved by the Food and Drug Administration.    Fluconazole Islt MIC 0.5 ug/mL Susceptible  Final    Comment: (NOTE) Breakpoints have been established for only some organism-drug combinations as indicated. This test was developed and its performance characteristics determined by [facility]. It has not been cleared or approved by the Food and Drug Administration.    Flucytosine MIC 0.12 ug/mL  Final    Comment: (NOTE) Breakpoints have been established for only some organism-drug combinations as indicated. This test was developed and its performance characteristics determined by [facility]. It has not been cleared or approved by the Food and Drug Administration.    Itraconazole MIC 0.12 ug/mL  Final    Comment: (NOTE) Breakpoints have been established for only some organism-drug combinations as indicated. This test was developed and its performance characteristics determined by [facility]. It has not been cleared or approved by the Food and Drug Administration.    Voriconazole MIC Comment  Final    Comment: (NOTE) 0.016 ug/mL Susceptible Breakpoints have been established for only some organism-drug combinations as indicated. This test was developed and its performance characteristics determined by [facility]. It has not been cleared or approved by the Food and Drug Administration. Performed At: [facility] [place], [place] [id number] Rush Farmer MD KG:[id number]    Source BLOOD  Final    Comment: Performed at [facility], [place] [address], [place],  [place] [address]  MRSA Next Gen by PCR, Nasal     Status: None   Collection Time: 10/29/21 12:11 PM   Specimen: Nasal Mucosa; Nasal Swab  Result Value Ref Range Status   MRSA by PCR Next Gen NOT DETECTED NOT DETECTED Final    Comment: (NOTE) The GeneXpert MRSA Assay (FDA approved for NASAL  specimens only), is one component of a comprehensive MRSA colonization surveillance program. It is not intended to diagnose MRSA infection nor to guide or monitor treatment for MRSA infections. Test performance is not FDA approved in patients less than 43 years old. Performed at [facility], [address] [place], [place] [address]   Culture, blood (Routine X 2) w Reflex to ID Panel     Status: None   Collection Time: 10/29/21  2:41 PM   Specimen: BLOOD  Result Value Ref Range Status   Specimen Description BLOOD RIGHT ANTECUBITAL  Final   Special Requests   Final    BOTTLES DRAWN AEROBIC AND ANAEROBIC Blood Culture adequate volume   Culture   Final    NO GROWTH 5 DAYS Performed at [facility], [address] [place], [place] [address]    Report Status 11/03/2021 FINAL  Final  Culture, blood (Routine X 2) w Reflex to ID Panel     Status: None   Collection Time: 10/29/21  3:00 PM   Specimen: BLOOD  Result Value Ref Range Status   Specimen Description BLOOD LEFT ANTECUBITAL  Final   Special Requests   Final    BOTTLES DRAWN AEROBIC AND ANAEROBIC Blood Culture adequate volume   Culture   Final    NO GROWTH 5 DAYS Performed at [facility], [address] [place], [place] [address]    Report Status 11/03/2021 FINAL  Final  Culture, blood (Routine X 2) w Reflex to ID Panel     Status: None   Collection Time: 10/30/21  5:14 AM   Specimen: BLOOD  Result Value Ref Range Status   Specimen Description BLOOD LEFT [facility]  Final   Special Requests   Final    BOTTLES DRAWN AEROBIC AND ANAEROBIC Blood Culture adequate volume   Culture   Final    NO GROWTH 5 DAYS Performed at  [facility], [address], [place], [place] [address]    Report Status 11/04/2021 FINAL  Final  Culture, blood (Routine X 2) w Reflex to ID Panel     Status: None   Collection Time: 10/30/21  5:15 AM   Specimen: BLOOD  Result Value Ref Range Status   Specimen Description BLOOD LEFT HAND  Final   Special Requests   Final    BOTTLES DRAWN AEROBIC AND ANAEROBIC Blood Culture adequate volume   Culture   Final    NO GROWTH 5 DAYS Performed at [facility], [address] [place], [place] [address]    Report Status 11/04/2021 FINAL  Final  Cath Tip Culture     Status: Abnormal   Collection Time: 10/30/21  3:51 PM   Specimen: Catheter Tip  Result Value Ref Range Status   Specimen Description CATH TIP  Final   Special Requests   Final    CHEST PORT CATH TIP Performed at [facility], [place] [address], [place], [place] [address]    Culture >=100,000 COLONIES/mL CANDIDA ALBICANS (A)  Final   Report Status 11/02/2021 FINAL  Final  Body fluid culture w Gram Stain     Status: None   Collection Time: 10/31/21 12:25 PM   Specimen: PATH Cytology Pleural fluid  Result Value Ref Range Status   Specimen Description   Final    PLEURAL Performed at [facility], [address] [place], [place] [address]    Special Requests   Final    NONE Performed at [facility],  [place], [place] [address]    Gram Stain NO WBC SEEN NO ORGANISMS SEEN   Final   Culture   Final    NO GROWTH 3 DAYS Performed at [facility] [address], [place], [place] [address]    Report Status 11/04/2021 FINAL  Final         Radiology Studies: [place] EKG SITE RITE  Result Date: 11/06/2021 If Site Rite image not attached, placement could not be confirmed due to current cardiac rhythm.  CT ABDOMEN PELVIS W CONTRAST  Result Date: 11/05/2021 CLINICAL DATA:  Metastatic cervical cancer re-evaluation. EXAM: CT ABDOMEN AND PELVIS WITH IV AND ORAL  CONTRAST TECHNIQUE: Multidetector CT imaging of the abdomen and pelvis was performed after oral contrast administration using the standard protocol following bolus administration of intravenous contrast. RADIATION DOSE REDUCTION: This exam was performed according to the departmental dose-optimization program which includes automated exposure control, adjustment of the mA and/or kV according to patient size and/or use of iterative reconstruction technique. CONTRAST:  147m OMNIPAQUE IOHEXOL 300 MG/ML  SOLN COMPARISON:  CT abdomen and pelvis with IV contrast 08/10/2021, CT chest, abdomen and pelvis with IV and oral contrast 06/03/2021. FINDINGS: Lower chest: Interval new presumed metastatic nodules noted in the right lower lobe on series 3 axial image 10 (3 mm), image 12 (6 mm), and image 21 (5 mm). Stable 7 mm fissural right infrahilar lung nodule again on axial image 9. Anteriorly in the right middle lobe there is a 1 cm nodule on image 3 which is new, and interval enlargement of a now 1.1 cm nodule on image 1 which previously 4 mm. A 1 cm fissural right lung base nodule on image 4 was previously 4 mm as well. Marked elevation of the left hemidiaphragm is again noted consistent with eventration or paresis with left lung base mostly excluded from the exam aside from visualization of at least a small sized left pleural effusion, possibly larger based on the most recent portable chest 10/31/2021. There is a patulous distal thoracic esophagus with retained versus refluxed contrast within. Small hiatal hernia. There is mild cardiomegaly with no pericardial effusion. Hepatobiliary: Limited liver visualization due to breathing motion. There is no obvious mass enhancement. The liver is mildly steatotic as before. There are multiple calcified gallstones up to 1.2 cm in size but no wall thickening or biliary dilatation. Pancreas: No focal abnormality or inflammatory change. Spleen: No mass or splenomegaly. Adrenals/Urinary  Tract: There is no adrenal mass. There are a few too small to characterize hypodensities in the left kidney which are statistically most likely cysts. Right renal cortex is unremarkable. There is no urinary stone or obstruction. There is no bladder thickening or bladder stone. Stomach/Bowel: No dilatation or wall thickening including the appendix. Scattered uncomplicated colonic diverticula. Mild fecal stasis. Vascular/Lymphatic: Minimal aortic atherosclerosis. Anterior and left para-aortic soft tissue fullness shows improvement. For example soft tissue thickening anterior to the aorta previously measured 1.3 cm in thickness is now no more than 0.8 cm in thickness (series 2 axial 37). Left para-aortic soft tissue fullness was previously 1.2 cm in short axis thickness now 0.9 cm (series 2 axial 46). No pelvic or further abdominal adenopathy is seen. Reproductive: Uterus and bilateral adnexa are unremarkable. Other: There is no incarcerated hernia, no free air, hemorrhage or free fluid. There is a subcutaneous rounded low-density lesion in the left anterolateral abdominal wall measuring 1.4 cm and 0.9 Hounsfield units on 2:38 which could be evidence  of recent injection or could be a subcutaneous metastatic deposit. If this is a subcutaneous metastasis I do not see any others. Musculoskeletal: Compression fractures of the L2 lower plate and L4 upper plate are slightly worsened in the interval since 08/10/2021 but there is no new compression fracture. Other visualized vertebra are normal in heights with osteopenia. No destructive bone lesion is seen. Sclerosis of the sacrum alongside the SI joints is again noted consistent with a healed or mostly healed sacral insufficiency fracture and was seen on the prior study. Trabecular coarsening again noted of the superior left acetabular region and left femoral head again is seen, could be due to hemangiomatous involvement or Paget's disease of bone but does not have the  typical appearance of metastatic disease. IMPRESSION: 1. New and enlarging metastatic nodules in the right middle and lower lobes. 2. Improvement in periaortic adenopathy.  No new adenopathy. 3. Interval mild worsening of compression fractures at L2 and 4. Osteopenia. 4. 1.4 cm low-density subcutaneous rounded nodule or injection site anterolateral left mid abdominal wall. Subcutaneous metastasis is not excluded but no other subcutaneous abnormality is seen elsewhere. 5. Again noted trabecular coarsening in the superior left acetabulum and left femoral head which could be due to hemangiomatous involvement or Paget's disease, but does not have the typical appearance of bone metastases. 6. Healed or mostly healed sacral insufficiency fracture versus chronic stress reaction. 7. Cholelithiasis and additional chronic changes. 8. Aortic atherosclerosis. 9. Patulous distal esophagus containing refluxed or retained contrast. Electronically Signed   By: Telford Nab M.D.   On: 11/05/2021 20:33        Scheduled Meds:  Chlorhexidine Gluconate Cloth  6 each Topical Daily   diclofenac Sodium  2 g Topical QID   LORazepam  0.5 mg Oral TID   multivitamin with minerals  1 tablet Oral Daily   mouth rinse  15 mL Mouth Rinse 4 times per day   Ensure Max Protein  11 oz Oral TID   scopolamine  1 patch Transdermal Q72H   sodium chloride flush  10-40 mL Intracatheter Q12H   Continuous Infusions:  sodium chloride 100 mL/hr at 11/07/21 1015   fluconazole (DIFLUCAN) IV 400 mg (11/07/21 1116)   promethazine (PHENERGAN) injection (IM or IVPB) Stopped (11/07/21 0007)     LOS: 10 days    Time spent: 35 minutes    Barb Merino, MD [facility] Pager [phone]

## 2021-11-07 NOTE — Progress Notes (Signed)
Occupational Therapy Treatment Patient Details Name: Jill Rodriguez MRN: [id number] DOB: Nov 01, 1960 Today's Date: 11/07/2021   History of present illness Pt is a 61 y.o. 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 coli UTI, presents to the ED with intractable nausea and vomiting that started 2 days prior.   OT comments  Upon entering the room, pt supine in bed with family present in room. Use of video interpreter this session per pt and family request. Pt reports feeling unwell but willing to work with therapy this session. Pt needing min A to EOB and sits for ~ 4 minutes with close supervision but then reports increase in nausea and requesting to return to bed. Mod A for sit >supine for safety and needing assistance to adjust in bed. All needs within reach.    Recommendations for follow up therapy are one component of a multi-disciplinary discharge planning process, led by the attending physician.  Recommendations may be updated based on patient status, additional functional criteria and insurance authorization.    Follow Up Recommendations  Home health OT    Assistance Recommended at Discharge Frequent or constant Supervision/Assistance  Patient can return home with the following  A lot of help with bathing/dressing/bathroom;A little help with walking and/or transfers;Assistance with cooking/housework;Help with stairs or ramp for entrance;Assist for transportation;Direct supervision/assist for financial management;Direct supervision/assist for medications management   Equipment Recommendations  BSC/3in1       Precautions / Restrictions Precautions Precautions: Fall       Mobility Bed Mobility Overal bed  mobility: Needs Assistance Bed Mobility: Supine to Sit, Sit to Supine     Supine to sit: Min assist Sit to supine: Min assist   General bed mobility comments: Pt needing assistance with trunk support    Transfers                   General transfer comment: declined secondary to nausea     Balance Overall balance assessment: Needs assistance Sitting-balance support: Feet supported Sitting balance-Leahy Scale: Fair                                     ADL either performed or assessed with clinical judgement    Extremity/Trunk Assessment Upper Extremity Assessment Upper Extremity Assessment: Generalized weakness   Lower Extremity Assessment Lower Extremity Assessment: Generalized weakness        Vision Patient Visual Report: No change from baseline            Cognition Arousal/Alertness: Awake/alert Behavior During Therapy: WFL for tasks assessed/performed Overall Cognitive Status: Within Functional Limits for tasks assessed                                                     Pertinent Vitals/ Pain       Pain Assessment Pain Assessment: No/denies pain         Frequency  Min 2X/week        Progress Toward Goals  OT Goals(current goals can now be found in the  care plan section)  Progress towards OT goals: Progressing toward goals  Acute Rehab OT Goals Patient Stated Goal: to go home with family and feel better OT Goal Formulation: With patient/family Time For Goal Achievement: 11/18/21 Potential to Achieve Goals: [place] Discharge plan remains appropriate;Frequency remains appropriate       AM-PAC OT "6 Clicks" Daily Activity     Outcome Measure   Help from another person eating meals?: A Little Help from another person taking care of personal grooming?: A Little Help from another person toileting, which includes using toliet, bedpan, or urinal?: A Lot Help from another person bathing (including  washing, rinsing, drying)?: A Lot Help from another person to put on and taking off regular upper body clothing?: A Little Help from another person to put on and taking off regular lower body clothing?: A Lot 6 Click Score: 15    End of Session    OT Visit Diagnosis: Unsteadiness on feet (R26.81);Muscle weakness (generalized) (M62.81)   Activity Tolerance Patient limited by fatigue   Patient Left in bed;with call bell/phone within reach;with bed alarm set;with family/visitor present   Nurse Communication Mobility status        Time: 1445-1457 OT Time Calculation (min): 12 min  Charges: OT General Charges $OT Visit: 1 Visit OT Treatments $Therapeutic Activity: 8-22 mins  Darleen Crocker, MS, OTR/L , CBIS ascom [phone]  11/07/21, 3:26 PM

## 2021-11-07 NOTE — Progress Notes (Signed)
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 To discharging provider:  please sign these orders via discharge navigator,  Select New Orders & click on the button choice - Manage This Unsigned Work.     Thank you for allowing pharmacy to be a part of this patient's care.  Doreene Eland, PharmD, BCPS, BCIDP Work Cell: [phone] 11/07/2021 10:35 AM

## 2021-11-07 NOTE — TOC Progression Note (Addendum)
Transition of Care [facility]) - Progression Note    Patient Details  Name: Jill Rodriguez MRN: [id number] Date of Birth: [birth year]  [facility] CM/SW Contact  [name], RN Phone Number: 11/07/2021, 1:41 PM  Clinical Narrative:    11:43 Spoke with [name] from [facility] regarding home infusion. [facility] is only in network with [facility]. [name] is coordinating with [facility] to have patient home infusion arrange. Discharge likely next week d/t patient education having to be done in the home and arrangement still being made and scheduled.   1:37pm  Patient daughter, [name] updated about discharge plan regarding infusion and OON status with [facility]. Advised HH would be arrange for PT. Daughter at patient bedside. Patient did not have a preference.  1:41pm Spoke with [name] from [employer]. She provided the following information: [name] for [facility] phone number  [phone] fax [phone]. All current information is being forwarded. HH may be arrange via [facility] as well d/t network status.         Expected Discharge Plan and Services                                                 Social Determinants of Health (SDOH) Interventions    Readmission Risk Interventions     No data to display

## 2021-11-08 LAB — CULTURE, BLOOD (ROUTINE X 2)
Special Requests: ADEQUATE
Special Requests: ADEQUATE

## 2021-11-08 MED ORDER — POLYETHYLENE GLYCOL 3350 17 G PO PACK
17.0000 g | PACK | Freq: Every day | ORAL | Status: DC
  Administered 2021-11-08: 17 g via ORAL
  Filled 2021-11-08 (×2): qty 1

## 2021-11-08 MED ORDER — DOCUSATE SODIUM 100 MG PO CAPS
100.0000 mg | ORAL_CAPSULE | Freq: Two times a day (BID) | ORAL | Status: AC
Start: 2021-11-08 — End: 2021-11-09
  Administered 2021-11-08 (×2): 100 mg via ORAL
  Filled 2021-11-08 (×2): qty 1

## 2021-11-08 MED ORDER — ENSURE ENLIVE PO LIQD
237.0000 mL | Freq: Three times a day (TID) | ORAL | Status: DC
Start: 2021-11-08 — End: 2021-11-09
  Administered 2021-11-08 (×2): 237 mL via ORAL

## 2021-11-08 MED ORDER — BISACODYL 10 MG RE SUPP
10.0000 mg | Freq: Once | RECTAL | Status: AC
Start: 2021-11-08 — End: 2021-11-08
  Administered 2021-11-08: 10 mg via RECTAL
  Filled 2021-11-08: qty 1

## 2021-11-08 NOTE — Progress Notes (Addendum)
PROGRESS NOTE    Jill Rodriguez  ERX:[id number] DOB: [birth year] DOA: 10/28/2021 PCP: Pcp, No    Brief Narrative:  61 year old with recurrent squamous cell carcinoma of the cervix metastatic to lung and currently on chemotherapy, complicated by chemotherapy-induced nausea, vomiting , neutropenia admitted through emergency room with intractable nausea and vomiting started 2 days ago.  She was treated symptomatically, found to have candidemia and currently on IV Diflucan.  She was also found to have vocal cord paralysis and esophageal dysmotility with air-fluid levels.  Underwent left-sided thoracentesis for large pleural effusion removal of 500 cc of pleural fluid.  Cytology is negative for malignancy.  Remained in the hospital, very debilitated and unable to tolerate oral medications. 7/13, PICC line and home infusion therapy planned. 7/14- awaiting home infusions setup, continue [place] conversation. Difficult case, poor prognosis.  Likely will leave Monday or Tuesday depending on Home care infusion diflucan availability  Assessment & plan of care:   Candidemia with Candida albicans, neutropenic fever: Port-A-Cath removed, culture with Candida albicans.  Repeat cultures cleared. TEE not performed because of severe risk with esophageal dysmotility. PICC line, IV Diflucan at home until 8/17.  Will need outpatient infusion set up as well as family to learn about medication administration.  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  Follow-up with outpatient oncology.  Seen by palliative care.  Currently desires full code.   Currently following up at [facility].  Repeat CT scan of the chest abdomen pelvis done on request of patient's daughter that shows increasing size of pulmonary metastasis, adenopathy in the abdomen but no  other metastatic lesions.  Patient with severe debility and not eating well. I have suggested patient and family to talk to their oncologist about goal of care/benefits and side effects of chemotherapy as well as prognosis.  Left pleural effusion: Negative for malignancy.  Pyuria with multiple drug-resistant E. coli: Suspected colonization.  Acute kidney injury Hypokalemia Hypomagnesemia Hypophosphatemia: Improved and adequate.  Advance physical debility and frailty: Working with PT OT.  Has adequate support system at home.  Plan to go home with outpatient PT OT.  Goal of care discussion: Multiple discussion with patient, husband and daughter.  Patient has vocal cord paralysis and also does not speak [place], used Marketing executive as well as daughter [name] on the speaker phone and husband who speaks limited English at the bedside. Every time it was asked, patient desires to continue chemotherapy. Family is asking to transfer care from [facility] to [place] because they like to get chemotherapy here. Family also wondering about prognosis, this should be discussed with her oncologist when they go to visit next week. I advised to talk to oncology service at [facility] to transfer care back to [facility] because they need to make a referral.  Moderate protein calorie malnutrition: Significant reflux and dysphagia secondary to esophageal dysmotility. Nutrition Status: Nutrition Problem: Inadequate oral intake Etiology: poor appetite Signs/Symptoms: per patient/family report Interventions: MVI, Boost Breeze    Decision was made to get a PICC line and set up home infusion therapy daughter will learn to do daily injections of Diflucan.  Stable for discharge after home infusion set up.    DVT prophylaxis: SCDs Start: 10/28/21 2223   Code Status: Full code Family Communication: Husband at bedside. Disposition Plan: Status is: Inpatient Remains inpatient appropriate because: IV  antibiotics     Consultants:  Infectious disease  Palliative care  Procedures:  None  Antimicrobials:  Diflucan   Subjective: NAEON. No fevers, or chills. Improving diet/nutrition Add ensure today   Objective: Vitals:   11/07/21 1619 11/07/21 2022 11/08/21 0500 11/08/21 0539  BP: (!) 156/83 (!) 152/80  (!) 166/84  Pulse: 91 94  100  Resp: '15 16  16  '$ Temp: 98.6 F (37 C) 98.3 F (36.8 C)  98.2 F (36.8 C)  TempSrc:  Oral  Oral  SpO2: 92% 94%  94%  Weight:   74.5 kg   Height:        Intake/Output Summary (Last 24 hours) at 11/08/2021 0806 Last data filed at 11/08/2021 7741 Gross per 24 hour  Intake 1066.67 ml  Output 300 ml  Net 766.67 ml   Filed Weights   10/29/21 0159 11/06/21 0500 11/08/21 0500  Weight: 73 kg 73.1 kg 74.5 kg    Examination:  General: Sick looking, flat affect.  Not in distress.  Pleasant to conversation today. On room air.  Dysphonia but able to speak. Cardiovascular: S1-S2 normal.  Regular rate rhythm. Respiratory: Bilateral clear. Gastrointestinal: Soft.  Nontender.  Obese and pendulous. Ext: No deformities. Neuro: Alert oriented.  Flat affect.      Data Reviewed: I have personally reviewed following labs and imaging studies  CBC: Recent Labs  Lab 11/03/21 0500 11/04/21 0502 11/05/21 0506  WBC 2.8* 2.5* 2.9*  NEUTROABS 1.6*  --  1.9  HGB 7.7* 7.5* 7.8*  HCT 23.0* 23.2* 23.7*  MCV 93.5 94.7 94.8  PLT 33* 36* 41*   Basic Metabolic Panel: Recent Labs  Lab 11/03/21 0500 11/04/21 0502 11/05/21 0506  NA 135 135 135  K 3.3* 3.4* 3.9  CL 102 101 100  CO2 '29 29 29  '$ GLUCOSE 120* 98 108*  BUN '12 11 11  '$ CREATININE 0.86 0.91 1.01*  CALCIUM 8.0* 8.2* 8.5*  MG 1.4* 2.0  --   PHOS 3.0  --   --    GFR: Estimated Creatinine Clearance: 57.8 mL/min (A) (by C-G formula based on SCr of 1.01 mg/dL (H)). Liver Function Tests: Recent Labs  Lab 11/04/21 0502  AST 28  ALT 23  ALKPHOS 80  BILITOT 0.7  PROT 5.4*  ALBUMIN 2.6*    No results for input(s): "LIPASE", "AMYLASE" in the last 168 hours. No results for input(s): "AMMONIA" in the last 168 hours. Coagulation Profile: No results for input(s): "INR", "PROTIME" in the last 168 hours. Cardiac Enzymes: No results for input(s): "CKTOTAL", "CKMB", "CKMBINDEX", "TROPONINI" in the last 168 hours. BNP (last 3 results) No results for input(s): "PROBNP" in the last 8760 hours. HbA1C: No results for input(s): "HGBA1C" in the last 72 hours. CBG: No results for input(s): "GLUCAP" in the last 168 hours. Lipid Profile: No results for input(s): "CHOL", "HDL", "LDLCALC", "TRIG", "CHOLHDL", "LDLDIRECT" in the last 72 hours. Thyroid Function Tests: No results for input(s): "TSH", "T4TOTAL", "FREET4", "T3FREE", "THYROIDAB" in the last 72 hours. Anemia Panel: No results for input(s): "VITAMINB12", "FOLATE", "FERRITIN", "TIBC", "IRON", "RETICCTPCT" in the last 72 hours.  Sepsis Labs: No results for input(s): "PROCALCITON", "LATICACIDVEN" in the last 168 hours.  Recent Results (from the past 240 hour(s))  MRSA Next Gen by PCR, Nasal     Status: None   Collection Time: 10/29/21 12:11 PM   Specimen: Nasal Mucosa; Nasal Swab  Result Value Ref Range Status   MRSA by PCR Next Gen NOT DETECTED NOT DETECTED Final    Comment: (NOTE) The GeneXpert MRSA Assay (FDA approved  for NASAL specimens only), is one component of a comprehensive MRSA colonization surveillance program. It is not intended to diagnose MRSA infection nor to guide or monitor treatment for MRSA infections. Test performance is not FDA approved in patients less than 63 years old. Performed at [facility], [address] [place], [place] [address]   Culture, blood (Routine X 2) w Reflex to ID Panel     Status: None   Collection Time: 10/29/21  2:41 PM   Specimen: BLOOD  Result Value Ref Range Status   Specimen Description BLOOD RIGHT ANTECUBITAL  Final   Special Requests   Final    BOTTLES DRAWN  AEROBIC AND ANAEROBIC Blood Culture adequate volume   Culture   Final    NO GROWTH 5 DAYS Performed at [facility], [address] [place], [place] [address]    Report Status 11/03/2021 FINAL  Final  Culture, blood (Routine X 2) w Reflex to ID Panel     Status: None   Collection Time: 10/29/21  3:00 PM   Specimen: BLOOD  Result Value Ref Range Status   Specimen Description BLOOD LEFT ANTECUBITAL  Final   Special Requests   Final    BOTTLES DRAWN AEROBIC AND ANAEROBIC Blood Culture adequate volume   Culture   Final    NO GROWTH 5 DAYS Performed at [facility], [address] [place], [place] [address]    Report Status 11/03/2021 FINAL  Final  Culture, blood (Routine X 2) w Reflex to ID Panel     Status: None   Collection Time: 10/30/21  5:14 AM   Specimen: BLOOD  Result Value Ref Range Status   Specimen Description BLOOD LEFT [facility]  Final   Special Requests   Final    BOTTLES DRAWN AEROBIC AND ANAEROBIC Blood Culture adequate volume   Culture   Final    NO GROWTH 5 DAYS Performed at [facility], [address] [place], [place] [address]    Report Status 11/04/2021 FINAL  Final  Culture, blood (Routine X 2) w Reflex to ID Panel     Status: None   Collection Time: 10/30/21  5:15 AM   Specimen: BLOOD  Result Value Ref Range Status   Specimen Description BLOOD LEFT HAND  Final   Special Requests   Final    BOTTLES DRAWN AEROBIC AND ANAEROBIC Blood Culture adequate volume   Culture   Final    NO GROWTH 5 DAYS Performed at [facility], [address] [place], [place] [address]    Report Status 11/04/2021 FINAL  Final  Cath Tip Culture     Status: Abnormal   Collection Time: 10/30/21  3:51 PM   Specimen: Catheter Tip  Result Value Ref Range Status   Specimen Description CATH TIP  Final   Special Requests   Final    CHEST PORT CATH TIP Performed at [facility], [place] [address] [place], [place] [address]    Culture >=100,000  COLONIES/mL CANDIDA ALBICANS (A)  Final   Report Status 11/02/2021 FINAL  Final  Body fluid culture w Gram Stain     Status: None   Collection Time: 10/31/21 12:25 PM   Specimen: PATH Cytology Pleural fluid  Result Value Ref Range Status   Specimen Description   Final    PLEURAL Performed at [facility], [address], [place],  [address]    Special Requests   Final    NONE Performed at  [place], [place] [address]    Gram Stain NO WBC SEEN NO ORGANISMS SEEN   Final   Culture   Final    NO GROWTH 3 DAYS Performed at [facility], [place] [address], [place], [place] [address]    Report Status 11/04/2021 FINAL  Final         Radiology Studies: [place] EKG SITE RITE  Result Date: 11/06/2021 If Site Rite image not attached, placement could not be confirmed due to current cardiac rhythm.       Scheduled Meds:  Chlorhexidine Gluconate Cloth  6 each Topical Daily   diclofenac Sodium  2 g Topical QID   feeding supplement  237 mL Oral TID BM   LORazepam  0.5 mg Oral TID   multivitamin with minerals  1 tablet Oral Daily   mouth rinse  15 mL Mouth Rinse 4 times per day   Ensure Max Protein  11 oz Oral TID   scopolamine  1 patch Transdermal Q72H   sodium chloride flush  10-40 mL Intracatheter Q12H   Continuous Infusions:  sodium chloride 100 mL/hr at 11/07/21 1015   fluconazole (DIFLUCAN) IV 400 mg (11/07/21 1116)   promethazine (PHENERGAN) injection (IM or IVPB) Stopped (11/07/21 0007)     LOS: 11 days    Time spent: 35 minutes    Vanna Scotland, MD [facility] Pager [phone]

## 2021-11-08 NOTE — Progress Notes (Signed)
Physical Therapy Treatment Patient Details Name: Jill Rodriguez MRN: [id number] DOB: 1960-05-02 Today's Date: 11/08/2021   History of Present Illness Pt is a 61 y.o. 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 coli UTI, presents to the ED with intractable nausea and vomiting that started 2 days prior.    PT Comments    Pt willing to work in PT but continues to be limited due to nausea and fatigue.  Pt able perform LE strength exercises in bed, needing cues for technique and increased time to perform.  Transfer training- pt continues to require supervision and increased time for upper trunk control supine to sit and stand pivot transfers with with RW.  Pt declined gait today due to feeling nauseous.   Recommendations for follow up therapy are one component of a multi-disciplinary discharge planning process, led by the attending physician.  Recommendations may be updated based on patient status, additional functional criteria and insurance authorization.  Follow Up Recommendations  Home health PT     Assistance Recommended at Discharge Frequent or constant Supervision/Assistance  Patient can return home with the following A lot of help with walking and/or transfers;A lot of help with bathing/dressing/bathroom;Assist for transportation;Direct supervision/assist for financial management;Assistance with cooking/housework;Help with stairs or ramp for entrance   Equipment Recommendations  Wheelchair (measurements PT);Wheelchair cushion (measurements PT);BSC/3in1    Recommendations for Other Services       Precautions / Restrictions Precautions Precautions: Fall Restrictions Weight Bearing Restrictions: No      Mobility  Bed Mobility Overal bed mobility: Needs Assistance Bed Mobility: Supine to Sit, Sit to Supine     Supine to sit: Min assist Sit to supine: Min assist   General bed mobility comments: Pt needing assistance with trunk support    Transfers Overall transfer level: Needs assistance Equipment used: Rolling walker (2 wheels) Transfers: Sit to/from Stand Sit to Stand: Min guard           General transfer comment: declined secondary to nausea, reported feeling dizzy.    Ambulation/Gait                   Stairs             Wheelchair Mobility    Modified Rankin (Stroke Patients Only)       Balance Overall balance assessment: Needs assistance Sitting-balance support: Feet supported Sitting balance-Leahy Scale: Fair     Standing balance support: Reliant on assistive device for balance Standing balance-Leahy Scale: Fair Standing balance comment: bilateral UE support  with walker, no LOB or LE buckling.                            Cognition Arousal/Alertness: Awake/alert Behavior During Therapy: WFL for tasks assessed/performed Overall Cognitive Status: Within Functional Limits for tasks assessed                                          Exercises Total Joint Exercises Ankle Circles/Pumps: Strengthening, Both, 20 reps Quad Sets: Both, 10 reps Heel Slides: Strengthening, Both, 10 reps    General Comments General comments (skin integrity, edema,  etc.): Family member was in room to interpret.      Pertinent Vitals/Pain      Home Living Family/patient expects to be discharged to:: Private residence Living Arrangements: Spouse/significant other                      Prior Function            PT Goals (current goals can now be found in the care plan section) Acute Rehab PT Goals Patient Stated Goal: to feel better PT Goal Formulation: With patient Time For Goal Achievement: 11/17/21 Potential to  Achieve Goals: Good Progress towards PT goals: Progressing toward goals    Frequency    Min 2X/week      PT Plan Current plan remains appropriate    Co-evaluation              AM-PAC PT "6 Clicks" Mobility   Outcome Measure  Help needed turning from your back to your side while in a flat bed without using bedrails?: A Little Help needed moving from lying on your back to sitting on the side of a flat bed without using bedrails?: A Little Help needed moving to and from a bed to a chair (including a wheelchair)?: A Little Help needed standing up from a chair using your arms (e.g., wheelchair or bedside chair)?: A Little Help needed to walk in hospital room?: A Lot Help needed climbing 3-5 steps with a railing? : A Lot 6 Click Score: 16    End of Session   Activity Tolerance: Patient limited by fatigue;Other (comment) Patient left: with call bell/phone within reach;with family/visitor present;in bed;with bed alarm set Nurse Communication: Mobility status PT Visit Diagnosis: Other abnormalities of gait and mobility (R26.89);Difficulty in walking, not elsewhere classified (R26.2);Muscle weakness (generalized) (M62.81)     Time: 3154-0086 PT Time Calculation (min) (ACUTE ONLY): 30 min  Charges:  $Therapeutic Exercise: 8-22 mins $Therapeutic Activity: 8-22 mins                     Bjorn Loser, PTA  11/08/21, 5:00 PM

## 2021-11-08 NOTE — TOC Progression Note (Addendum)
Transition of Care [facility]) - Progression Note    Patient Details  Name: Jill Rodriguez MRN: [id number] Date of Birth: 1960-06-19  Transition of Care [facility] CM/SW Contact  Izola Price, RN Phone Number: 11/08/2021, 9:55 AM  Clinical Narrative:  7/17: Left VM for Carolynn Sayers for update on [facility] home infusion set up as discharge orders are in chart. Prior CM note indicate [facility] is working with [facility] infusion for set up. Patient has discharge orders in today. Prior CM note indicate discharge likely next week d/t patient education needed for home infusion. Updated Unit RN. Simmie Davies RN CM   UPDATE: Per [name] Chandler/[facility] infusion is taking this over but will not have drug till Monday for in home on Tuesday. Patient will need to be here for doses due on Monday 11/10/21. CM will confirm this will [facility] on Monday per prior CM notes. Simmie Davies RN CM          Expected Discharge Plan and Services                                                 Social Determinants of Health (SDOH) Interventions    Readmission Risk Interventions     No data to display

## 2021-11-09 ENCOUNTER — Inpatient Hospital Stay
Admit: 2021-11-09 | Discharge: 2021-11-09 | Disposition: A | Discharge status: Home / Self Care | Payer: BLUE CROSS/BLUE SHIELD | Attending: Infectious Diseases | Admitting: Infectious Diseases

## 2021-11-09 LAB — ECHOCARDIOGRAM COMPLETE
AR max vel: 2.29 cm2
AV Area VTI: 2.77 cm2
AV Area mean vel: 2.34 cm2
AV Mean grad: 4 mmHg
AV Peak grad: 6.7 mmHg
Ao pk vel: 1.29 m/s
Area-P 1/2: 2.86 cm2
Height: 64 in
S' Lateral: 3.16 cm
Weight: 2518.54 oz

## 2021-11-09 NOTE — Progress Notes (Addendum)
PROGRESS NOTE    Jill Rodriguez  TIW:[id number] DOB: Jun 18, 1960 DOA: 10/28/2021 PCP: Pcp, No    Brief Narrative:  61 year old with recurrent squamous cell carcinoma of the cervix metastatic to lung and currently on chemotherapy, complicated by chemotherapy-induced nausea, vomiting , neutropenia admitted through emergency room with intractable nausea and vomiting started 2 days ago.  She was treated symptomatically, found to have candidemia and currently on IV Diflucan.  She was also found to have vocal cord paralysis and esophageal dysmotility with air-fluid levels.  Underwent left-sided thoracentesis for large pleural effusion removal of 500 cc of pleural fluid.  Cytology is negative for malignancy.  Remained in the hospital, very debilitated and unable to tolerate oral medications. 7/13, PICC line and home infusion therapy planned. 7/15 awaiting home infusions setup, continue [place] conversation. Difficult case, poor prognosis. 7/16- continue supportive care, Likely will leave Monday or Tuesday depending on Home care infusion diflucan availability  Assessment & plan of care:   Candidemia with Candida albicans, neutropenic fever: Port-A-Cath removed, culture with Candida albicans.  Repeat cultures cleared. TEE not performed because of severe risk with esophageal dysmotility. PICC line, IV Diflucan at home until 8/17.  Will need outpatient infusion set up as well as family to learn about medication administration.  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  Follow-up with outpatient oncology.  Seen by palliative care.  Currently desires full code.   Currently following up at [facility].  Repeat CT scan of the chest abdomen pelvis done on request of patient's daughter that shows increasing size of pulmonary metastasis,  adenopathy in the abdomen but no other metastatic lesions.  Patient with severe debility and not eating well. I have suggested patient and family to talk to their oncologist about goal of care/benefits and side effects of chemotherapy as well as prognosis.  Left pleural effusion: Negative for malignancy.  Pyuria with multiple drug-resistant E. coli: Suspected colonization.  Acute kidney injury Hypokalemia Hypomagnesemia Hypophosphatemia: Improved and adequate.  Advance physical debility and frailty: Working with PT OT.  Has adequate support system at home.  Plan to go home with outpatient PT OT.  Goal of care discussion: Multiple discussion with patient, husband and daughter.  Patient has vocal cord paralysis and also does not speak [place], used Marketing executive as well as daughter [name] on the speaker phone and husband who speaks limited English at the bedside. Every time it was asked, patient desires to continue chemotherapy. Family is asking to transfer care from [facility] to [place] because they like to get chemotherapy here. Family also wondering about prognosis, this should be discussed with her oncologist when they go to visit next week. I advised to talk to oncology service at [facility] to transfer care back to [facility] because they need to make a referral.  Moderate protein calorie malnutrition: Significant reflux and dysphagia secondary to esophageal dysmotility. Nutrition Status: Nutrition Problem: Inadequate oral intake Etiology: poor appetite Signs/Symptoms: per patient/family report Interventions: MVI, Boost Breeze    Decision was made to get a PICC line and set up home infusion therapy daughter will learn to do daily injections of Diflucan.  Stable for discharge after home infusion set up.    DVT prophylaxis: SCDs Start: 10/28/21 2223   Code Status: Full code Family Communication: Husband at bedside. Disposition Plan: Status is: Inpatient Remains  inpatient appropriate because: IV antibiotics     Consultants:  Infectious disease Palliative care  Procedures:  None  Antimicrobials:  Diflucan   Subjective: NAEON. No fevers, or chills. Improving diet/nutrition Add ensure today   Objective: Vitals:   11/08/21 1640 11/08/21 2154 11/09/21 0500 11/09/21 0619  BP: 127/60 (!) 147/74  (!) 146/80  Pulse: (!) 107 92  (!) 103  Resp:  16  16  Temp: 98.3 F (36.8 C) 98.3 F (36.8 C)  98.7 F (37.1 C)  TempSrc:  Oral  Oral  SpO2: 93% 90%  95%  Weight:   71.4 kg   Height:        Intake/Output Summary (Last 24 hours) at 11/09/2021 0805 Last data filed at 11/09/2021 0700 Gross per 24 hour  Intake --  Output 1350 ml  Net -1350 ml    Filed Weights   11/06/21 0500 11/08/21 0500 11/09/21 0500  Weight: 73.1 kg 74.5 kg 71.4 kg    Examination:  General: Sick looking, flat affect.  Not in distress.  Pleasant to conversation today. On room air.  Dysphonia but able to speak. Cardiovascular: S1-S2 normal.  Regular rate rhythm. Respiratory: Bilateral clear. Gastrointestinal: Soft.  Nontender.  Obese and pendulous. Ext: No deformities. Neuro: Alert oriented.  Flat affect.      Data Reviewed: I have personally reviewed following labs and imaging studies  CBC: Recent Labs  Lab 11/03/21 0500 11/04/21 0502 11/05/21 0506  WBC 2.8* 2.5* 2.9*  NEUTROABS 1.6*  --  1.9  HGB 7.7* 7.5* 7.8*  HCT 23.0* 23.2* 23.7*  MCV 93.5 94.7 94.8  PLT 33* 36* 41*    Basic Metabolic Panel: Recent Labs  Lab 11/03/21 0500 11/04/21 0502 11/05/21 0506  NA 135 135 135  K 3.3* 3.4* 3.9  CL 102 101 100  CO2 '29 29 29  '$ GLUCOSE 120* 98 108*  BUN '12 11 11  '$ CREATININE 0.86 0.91 1.01*  CALCIUM 8.0* 8.2* 8.5*  MG 1.4* 2.0  --   PHOS 3.0  --   --     GFR: Estimated Creatinine Clearance: 56.7 mL/min (A) (by C-G formula based on SCr of 1.01 mg/dL (H)). Liver Function Tests: Recent Labs  Lab 11/04/21 0502  AST 28  ALT 23  ALKPHOS 80   BILITOT 0.7  PROT 5.4*  ALBUMIN 2.6*    No results for input(s): "LIPASE", "AMYLASE" in the last 168 hours. No results for input(s): "AMMONIA" in the last 168 hours. Coagulation Profile: No results for input(s): "INR", "PROTIME" in the last 168 hours. Cardiac Enzymes: No results for input(s): "CKTOTAL", "CKMB", "CKMBINDEX", "TROPONINI" in the last 168 hours. BNP (last 3 results) No results for input(s): "PROBNP" in the last 8760 hours. HbA1C: No results for input(s): "HGBA1C" in the last 72 hours. CBG: No results for input(s): "GLUCAP" in the last 168 hours. Lipid Profile: No results for input(s): "CHOL", "HDL", "LDLCALC", "TRIG", "CHOLHDL", "LDLDIRECT" in the last 72 hours. Thyroid Function Tests: No results for input(s): "TSH", "T4TOTAL", "FREET4", "T3FREE", "THYROIDAB" in the last 72 hours. Anemia Panel: No results for input(s): "VITAMINB12", "FOLATE", "FERRITIN", "TIBC", "IRON", "RETICCTPCT" in the last 72 hours.  Sepsis Labs: No results for input(s): "PROCALCITON", "LATICACIDVEN" in the last 168 hours.  Recent Results (from the past 240 hour(s))  Cath Tip Culture     Status: Abnormal   Collection Time: 10/30/21  3:51 PM   Specimen: Catheter Tip  Result Value Ref Range Status   Specimen Description CATH TIP  Final   Special Requests   Final    CHEST PORT CATH  TIP Performed at [facility], [place] [address], [place], [place] [address]    Culture >=100,000 COLONIES/mL CANDIDA ALBICANS (A)  Final   Report Status 11/02/2021 FINAL  Final  Body fluid culture w Gram Stain     Status: None   Collection Time: 10/31/21 12:25 PM   Specimen: PATH Cytology Pleural fluid  Result Value Ref Range Status   Specimen Description   Final    PLEURAL Performed at [facility], [address], [place], [place] [address]    Special Requests   Final    NONE Performed at [facility], [place], [place] [address]    Gram Stain NO WBC SEEN NO  ORGANISMS SEEN   Final   Culture   Final    NO GROWTH 3 DAYS Performed at [facility], [place] [address], [place], [place] [address]    Report Status 11/04/2021 FINAL  Final         Radiology Studies: No results found.      Scheduled Meds:  Chlorhexidine Gluconate Cloth  6 each Topical Daily   diclofenac Sodium  2 g Topical QID   docusate sodium  100 mg Oral BID   feeding supplement  237 mL Oral TID BM   LORazepam  0.5 mg Oral TID   multivitamin with minerals  1 tablet Oral Daily   mouth rinse  15 mL Mouth Rinse 4 times per day   polyethylene glycol  17 g Oral Daily   Ensure Max Protein  11 oz Oral TID   scopolamine  1 patch Transdermal Q72H   sodium chloride flush  10-40 mL Intracatheter Q12H   Continuous Infusions:  sodium chloride 100 mL/hr at 11/07/21 1015   fluconazole (DIFLUCAN) IV 400 mg (11/08/21 1308)   promethazine (PHENERGAN) injection (IM or IVPB) 12.5 mg (11/09/21 0716)     LOS: 12 days    Time spent: 35 minutes    Vanna Scotland, MD [facility] Pager [phone]

## 2021-11-10 MED ORDER — SCOPOLAMINE 1 MG/3DAYS TD PT72: 1.0000 | MEDICATED_PATCH | TRANSDERMAL | 0 refills | Status: AC

## 2021-11-10 MED ORDER — ONDANSETRON HCL 4 MG PO TABS: 4.0000 mg | ORAL_TABLET | Freq: Three times a day (TID) | ORAL | 0 refills | Status: DC | PRN

## 2021-11-10 MED ORDER — PROCHLORPERAZINE EDISYLATE 10 MG/2ML IJ SOLN
5.0000 mg | Freq: Once | INTRAMUSCULAR | Status: AC | PRN
  Administered 2021-11-10: 5 mg via INTRAVENOUS
  Filled 2021-11-10: qty 1

## 2021-11-10 MED ORDER — LIP MEDEX EX OINT
TOPICAL_OINTMENT | CUTANEOUS | 0 refills | Status: DC | PRN
Start: 2021-11-10 — End: 2022-06-30

## 2021-11-10 MED ORDER — PROCHLORPERAZINE MALEATE 10 MG PO TABS: 10.0000 mg | ORAL_TABLET | Freq: Three times a day (TID) | ORAL | 0 refills | Status: AC | PRN

## 2021-11-10 MED ORDER — LIP MEDEX EX OINT: TOPICAL_OINTMENT | CUTANEOUS | Status: DC | PRN

## 2021-11-10 NOTE — Discharge Summary (Addendum)
Physician Discharge Summary   Patient: Jill Rodriguez MRN: [id number] DOB: 17-Jun-1960  Admit date:     10/28/2021  Discharge date: 11/10/21  Discharge Physician: Vanna Scotland   PCP: Pcp, No   Recommendations at discharge:     F/u with primary oncologist in 2 weeks F/u with infectious disease in 2-3 weeks  Discharge Diagnoses: Principal Problem:   Chemotherapy induced nausea and vomiting Active Problems:   Neutropenic fever ([place])   Candidemia ([place])   Pyuria   HCAP (healthcare-associated pneumonia)   Pancytopenia due to antineoplastic chemotherapy ([place])   Abnormal EKG   AKI (acute kidney injury) ([place])   Abnormal CT of the head   Chemotherapy induced neutropenia ([facility])   Vocal cord paralysis   Pleural effusion on left   S/P thoracentesis  Resolved Problems:   * No resolved hospital problems. *  Hospital Course:  Jill Rodriguez is a 61 y.o. 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 coli UTI sensitive only to ertapenem and nitrofurantoin, with last chemotherapy infusion on 6/23 who presents to the ED with intractable nausea and vomiting that started 2 days prior to hospital admission. She was treated symptomatically, found to have candidemia and currently on IV Diflucan.  She was also found to have vocal cord paralysis and esophageal dysmotility with air-fluid levels.  Underwent left-sided thoracentesis for large pleural effusion removal of 500 cc of pleural fluid.  Cytology is negative for malignancy.  Remained in the hospital, very debilitated and unable to tolerate oral medications. 7/13, PICC line and home infusion therapy planned. 7/15 awaiting home infusions setup, continue [place] conversation.  Difficult case, poor prognosis. 7/16- continue supportive care, Likely will leave Monday or Tuesday depending on Home care infusion diflucan availability     See below for additional details  Assessment and Plan:  Candidemia with Candida albicans, neutropenic fever: Port-A-Cath removed, culture with Candida albicans.  Repeat cultures cleared. TEE not performed because of severe risk with esophageal dysmotility. PICC line, IV Diflucan at home until 8/17.   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  Seen by palliative care.  Currently desires full code.   Currently following up at [facility].  Repeat CT scan of the chest abdomen pelvis done on request of patient's daughter that shows increasing size of pulmonary metastasis, adenopathy in the abdomen but no other metastatic lesions.  Patient with severe debility and not eating well.  I have suggested patient and family to talk to their oncologist about goal of care/benefits and side effects of chemotherapy as well as prognosis.   Left pleural effusion: Negative for malignancy. S/p Thoracentesis   Pyuria with multiple drug-resistant E. coli: Suspected colonization.   Acute kidney injury Hypokalemia Hypomagnesemia Hypophosphatemia: Improved and adequate.   Advance physical debility and frailty: Working with PT OT.  Has adequate support system at home.  Plan to go home with outpatient PT OT.   Goal of care discussion: Multiple discussion with patient, husband and daughter.  Patient has vocal cord paralysis and  also does not speak [place], used Marketing executive as well as daughter [name] on the speaker phone and husband who speaks  limited English at the bedside. Every time it was asked, patient desires to continue chemotherapy. Family is asking to transfer care from [facility] to [place] because they like to get chemotherapy here. Family also wondering about prognosis, this should be discussed with her oncologist when they go to visit next week. I advised to talk to oncology service at [facility] to transfer care back to [facility] because they need to make a referral.   Moderate protein calorie malnutrition: Significant reflux and dysphagia secondary to esophageal dysmotility. Nutrition Status: Nutrition Problem: Inadequate oral intake Etiology: poor appetite Signs/Symptoms: per patient/family report Interventions: MVI, Boost Breeze        Consultants:  Surgery, Dr. Richardson Landry Palliative Care, Wadie Lessen, NP Oncology, Dr. Tasia Catchings Infectious Disease Dr. Delaine Lame IR Angie Fava [place]isseau Procedures performed: Thoracentesis, Port removal.  Disposition: H with [facility] [place] services Diet recommendation:  Discharge Diet Orders (From admission, onward)     Start     Ordered   11/10/21 0000  Diet - low sodium heart healthy        11/10/21 1354   11/07/21 0000  Diet general       Comments: Small frequent meals.  All-time aspiration precautions.   11/07/21 1127           As much as can tolerate DISCHARGE MEDICATION: Allergies as of 11/10/2021   No Known Allergies      Medication List     STOP taking these medications    apixaban 5 MG Tabs tablet Commonly known as: ELIQUIS   nitrofurantoin (macrocrystal-monohydrate) 100 MG capsule Commonly known as: Macrobid       TAKE these medications    dexamethasone 4 MG tablet Commonly known as: DECADRON Take 4 mg by mouth as directed. Take 1 tablet ('4mg'$ ) 6/17-6/18 After chemo, take 2 tablets ('8mg'$ ) on days 2-4 then 1 tablet (4 mg) on days 5-7   dronabinol 2.5 MG capsule Commonly known as: MARINOL Take 2.5 mg by mouth daily.   fluconazole  IVPB Commonly known as:  DIFLUCAN Inject 400 mg into the vein daily. 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   lip balm ointment Apply topically as needed for lip care.   LORazepam 0.5 MG tablet Commonly known as: ATIVAN Take 0.5 mg by mouth every 8 (eight) hours as needed.   OLANZapine 5 MG tablet Commonly known as: ZYPREXA Take 5 mg by mouth at bedtime.   ondansetron 8 MG tablet Commonly known as: ZOFRAN Take 8 mg by mouth 3 (three) times daily. What changed: Another medication with the same name was added. Make sure you understand how and when to take each.   ondansetron 4 MG tablet Commonly known as: ZOFRAN Take 1 tablet (4 mg total) by mouth every 8 (eight) hours as needed for nausea. What changed: You were already taking a medication with the same name, and this prescription was added. Make sure you understand how and when to take each.   prochlorperazine 10 MG tablet Commonly known as: COMPAZINE  Take 1 tablet (10 mg total) by mouth every 8 (eight) hours as needed (Nausea or vomiting). What changed: when to take this   promethazine 25 MG suppository Commonly known as: PHENERGAN Place 1 suppository (25 mg total) rectally every 8 (eight) hours as needed for nausea, vomiting or refractory nausea / vomiting.   scopolamine 1 MG/3DAYS Commonly known as: TRANSDERM-SCOP Place 1 patch (1.5 mg total) onto the skin every 3 (three) days. What changed: how to take this   Thera-M Tabs Take 1 tablet by mouth daily.   traZODone 50 MG tablet Commonly known as: DESYREL Take 1 tablet (50 mg total) by mouth at  bedtime as needed for sleep.               Durable Medical Equipment  (From admission, onward)           Start     Ordered   11/05/21 1523  For home use only DME 3 n 1  Once        11/05/21 1522   11/05/21 1522  For home use only DME lightweight manual wheelchair with seat cushion  Once       Comments: Patient suffers from impaired ambulation which impairs their ability to perform daily activities like bathing, dressing, feeding, grooming, and toileting in the home.  A walker will not resolve  issue with performing activities of daily living. A wheelchair will allow patient to safely perform daily activities. Patient is not able to propel themselves in the home using a standard weight wheelchair due to endurance and general weakness. Patient can self propel in the lightweight wheelchair. Length of need Lifetime. Accessories: elevating leg rests (ELRs), wheel locks, extensions and anti-tippers.   11/05/21 1522              Discharge Care Instructions  (From admission, onward)           Start     Ordered   11/07/21 0000  Change dressing on IV access line weekly and PRN  (Home infusion instructions - [facility] )        11/07/21 1125            Follow-up Information     Earlie Server, MD. Schedule an appointment as soon as possible for a visit in 2 day(s).   Specialty: Oncology Contact information: [place] [address] [phone]         Tsosie Billing, MD Follow up in 2 week(s).   Specialty: Infectious Diseases Contact information: [place] [address] [phone]                Discharge Exam: Filed Weights   11/08/21 0500 11/09/21 0500 11/10/21 0500  Weight: 74.5 kg 71.4 kg 73.5 kg      General: chronically Ill appearing, no acute distress. .  Not in distress.  On room air.  Dysphonia but able to speak. Cardiovascular: S1-S2 normal.  Regular rate rhythm. Respiratory:  Bilateral clear. Gastrointestinal: Soft.  Nontender.  Obese and pendulous. Ext: No deformities. Neuro: Alert oriented.  Flat affect.    Condition at discharge: fair  The results of significant diagnostics from this hospitalization (including imaging, microbiology, ancillary and laboratory) are listed below for reference.   Imaging Studies: ECHOCARDIOGRAM COMPLETE  Result Date: 11/09/2021    ECHOCARDIOGRAM REPORT   Patient Name:   Jill Rodriguez Date of Exam: 11/09/2021 Medical Rec #:  [id number]  Height:       64.0 in Accession #:    [id number]          Weight:       157.4 lb Date of Birth:  June 16, 1960           BSA:          1.767 m Patient Age:    52 years            BP:           144/71 mmHg Patient Gender: F                   HR:           95 bpm. Exam Location:  [facility] Procedure: 2D Echo, Cardiac Doppler and Color Doppler Indications:     I38 Endocarditis  History:         Patient has prior history of Echocardiogram examinations, most                  recent 10/29/2021. Metastatic cervical cancer.  Sonographer:     Rosalia Hammers Referring Phys:  [id number] Tsosie Billing Diagnosing Phys: Serafina Royals MD  Sonographer Comments: Technically difficult study due to poor echo windows. Image acquisition challenging due to respiratory motion. IMPRESSIONS  1. Left ventricular ejection fraction, by estimation, is 55 to 60%. The left ventricle has normal function. The left ventricle has no regional wall motion abnormalities. Left ventricular diastolic parameters were normal.  2. Right ventricular systolic function is normal. The right ventricular size is normal.  3. The mitral valve is normal in structure. Trivial mitral valve regurgitation.  4. Cannot rule out vegetation due to poor sound transmission. The aortic valve is normal in structure. Aortic valve regurgitation is trivial. FINDINGS  Left Ventricle: Left ventricular ejection fraction, by estimation, is 55 to 60%. The left ventricle has  normal function. The left ventricle has no regional wall motion abnormalities. The left ventricular internal cavity size was normal in size. There is  no left ventricular hypertrophy. Left ventricular diastolic parameters were normal. Right Ventricle: The right ventricular size is normal. No increase in right ventricular wall thickness. Right ventricular systolic function is normal. Left Atrium: Left atrial size was normal in size. Right Atrium: Right atrial size was normal in size. Pericardium: There is no evidence of pericardial effusion. Mitral Valve: The mitral valve is normal in structure. Trivial mitral valve regurgitation. Tricuspid Valve: The tricuspid valve is normal in structure. Tricuspid valve regurgitation is trivial. Aortic Valve: Cannot rule out vegetation due to poor sound transmission. The aortic valve is normal in structure. Aortic valve regurgitation is trivial. Aortic valve mean gradient measures 4.0 mmHg. Aortic valve peak gradient measures 6.7 mmHg. Aortic valve area, by VTI measures 2.77 cm. Pulmonic Valve: The pulmonic valve was normal in structure. Pulmonic valve regurgitation is trivial. Aorta: The aortic root and ascending aorta are structurally normal, with no evidence of dilitation. IAS/Shunts: No atrial level shunt detected by color flow Doppler.  LEFT VENTRICLE PLAX 2D LVIDd:         4.29 cm LVIDs:         3.16 cm LV PW:         1.38 cm LV IVS:        1.20 cm LVOT diam:     1.80 cm LV SV:         71 LV SV Index:   40 LVOT Area:     2.54 cm  RIGHT VENTRICLE RV Basal diam:  2.82 cm LEFT ATRIUM             Index        RIGHT ATRIUM           Index LA diam:        3.00 cm 1.70 cm/m   RA Area:     10.20 cm LA Vol (A2C):   42.1 ml 23.82 ml/m  RA Volume:   19.40 ml  10.98 ml/m LA Vol (A4C):   30.2 ml 17.09 ml/m LA Biplane Vol: 36.1 ml 20.43 ml/m  AORTIC VALVE AV Area (Vmax):    2.29 cm AV Area (Vmean):   2.34 cm AV Area (VTI):     2.77 cm AV Vmax:           129.00 cm/s AV Vmean:           89.500 cm/s AV VTI:            0.255 m AV Peak Grad:      6.7 mmHg AV Mean Grad:      4.0 mmHg LVOT Vmax:         116.00 cm/s LVOT Vmean:        82.200 cm/s LVOT VTI:          0.278 m LVOT/AV VTI ratio: 1.09  AORTA Ao Root diam: 3.00 cm MITRAL VALVE MV Area (PHT): 2.86 cm     SHUNTS MV Decel Time: 265 msec     Systemic VTI:  0.28 m MV E velocity: 71.00 cm/s   Systemic Diam: 1.80 cm MV A velocity: 120.00 cm/s MV E/A ratio:  0.59 Serafina Royals MD Electronically signed by Serafina Royals MD Signature Date/Time: 11/09/2021/4:02:03 PM    Final    [place] EKG SITE RITE  Result Date: 11/06/2021 If Site Rite image not attached, placement could not be confirmed due to current cardiac rhythm.  CT ABDOMEN PELVIS W CONTRAST  Result Date: 11/05/2021 CLINICAL DATA:  Metastatic cervical cancer re-evaluation. EXAM: CT ABDOMEN AND PELVIS WITH IV AND ORAL CONTRAST TECHNIQUE: Multidetector CT imaging of the abdomen and pelvis was performed after oral contrast administration using the standard protocol following bolus administration of intravenous contrast. RADIATION DOSE REDUCTION: This exam was performed according to the departmental dose-optimization program which includes automated exposure control, adjustment of the mA and/or kV according to patient size and/or use of iterative reconstruction technique. CONTRAST:  162m OMNIPAQUE IOHEXOL 300 MG/ML  SOLN COMPARISON:  CT abdomen and pelvis with IV contrast 08/10/2021, CT chest, abdomen and pelvis with IV and oral contrast 06/03/2021. FINDINGS: Lower chest: Interval new presumed metastatic nodules noted in the right lower lobe on series 3 axial image 10 (3 mm), image 12 (6 mm), and image 21 (5 mm). Stable 7 mm fissural right infrahilar lung nodule again on axial image 9. Anteriorly in the right middle lobe there is a 1 cm nodule on image 3 which is new, and interval enlargement of a now 1.1 cm nodule on image 1 which previously 4 mm. A 1 cm fissural right lung base nodule on  image 4 was previously 4 mm as well. Marked elevation of the left hemidiaphragm is again noted consistent with eventration or paresis with left lung base mostly excluded from the exam aside from visualization of at least a small sized left pleural effusion, possibly larger based on the most recent portable chest 10/31/2021. There is a patulous distal thoracic esophagus with retained versus refluxed contrast within. Small  hiatal hernia. There is mild cardiomegaly with no pericardial effusion. Hepatobiliary: Limited liver visualization due to breathing motion. There is no obvious mass enhancement. The liver is mildly steatotic as before. There are multiple calcified gallstones up to 1.2 cm in size but no wall thickening or biliary dilatation. Pancreas: No focal abnormality or inflammatory change. Spleen: No mass or splenomegaly. Adrenals/Urinary Tract: There is no adrenal mass. There are a few too small to characterize hypodensities in the left kidney which are statistically most likely cysts. Right renal cortex is unremarkable. There is no urinary stone or obstruction. There is no bladder thickening or bladder stone. Stomach/Bowel: No dilatation or wall thickening including the appendix. Scattered uncomplicated colonic diverticula. Mild fecal stasis. Vascular/Lymphatic: Minimal aortic atherosclerosis. Anterior and left para-aortic soft tissue fullness shows improvement. For example soft tissue thickening anterior to the aorta previously measured 1.3 cm in thickness is now no more than 0.8 cm in thickness (series 2 axial 37). Left para-aortic soft tissue fullness was previously 1.2 cm in short axis thickness now 0.9 cm (series 2 axial 46). No pelvic or further abdominal adenopathy is seen. Reproductive: Uterus and bilateral adnexa are unremarkable. Other: There is no incarcerated hernia, no free air, hemorrhage or free fluid. There is a subcutaneous rounded low-density lesion in the left anterolateral abdominal wall  measuring 1.4 cm and 0.9 Hounsfield units on 2:38 which could be evidence of recent injection or could be a subcutaneous metastatic deposit. If this is a subcutaneous metastasis I do not see any others. Musculoskeletal: Compression fractures of the L2 lower plate and L4 upper plate are slightly worsened in the interval since 08/10/2021 but there is no new compression fracture. Other visualized vertebra are normal in heights with osteopenia. No destructive bone lesion is seen. Sclerosis of the sacrum alongside the SI joints is again noted consistent with a healed or mostly healed sacral insufficiency fracture and was seen on the prior study. Trabecular coarsening again noted of the superior left acetabular region and left femoral head again is seen, could be due to hemangiomatous involvement or Paget's disease of bone but does not have the typical appearance of metastatic disease. IMPRESSION: 1. New and enlarging metastatic nodules in the right middle and lower lobes. 2. Improvement in periaortic adenopathy.  No new adenopathy. 3. Interval mild worsening of compression fractures at L2 and 4. Osteopenia. 4. 1.4 cm low-density subcutaneous rounded nodule or injection site anterolateral left mid abdominal wall. Subcutaneous metastasis is not excluded but no other subcutaneous abnormality is seen elsewhere. 5. Again noted trabecular coarsening in the superior left acetabulum and left femoral head which could be due to hemangiomatous involvement or Paget's disease, but does not have the typical appearance of bone metastases. 6. Healed or mostly healed sacral insufficiency fracture versus chronic stress reaction. 7. Cholelithiasis and additional chronic changes. 8. Aortic atherosclerosis. 9. Patulous distal esophagus containing refluxed or retained contrast. Electronically Signed   By: Telford Nab M.D.   On: 11/05/2021 20:33   DG Swallowing Func-Speech Pathology  Result Date: 11/03/2021 Table formatting from the  original result was not included. Objective Swallowing Evaluation: Type of Study: Bedside Swallow Evaluation  Patient Details Name: Jill Rodriguez MRN: [id number] Date of Birth: Sep 05, 1960 Today's Date: 11/03/2021 Time: SLP Start Time (ACUTE ONLY): 1045 -SLP Stop Time (ACUTE ONLY): 1200 SLP Time Calculation (min) (ACUTE ONLY): 75 min Past Medical History: Past Medical History: Diagnosis Date  Cancer ([place])   cervical CA, that spread to lymph nodes; last chemo was 12/08/2019;  radiation 01/2020  COVID-19 10/2018  Nausea with vomiting 10/31/2019  Neoplasm related pain   No pertinent past medical history  Past Surgical History: Past Surgical History: Procedure Laterality Date  ESOPHAGOGASTRODUODENOSCOPY (EGD) WITH PROPOFOL N/A 03/28/2020  Procedure: ESOPHAGOGASTRODUODENOSCOPY (EGD) WITH PROPOFOL;  Surgeon: Milus Banister, MD;  Location: [facility];  Service: Endoscopy;  Laterality: N/A;  EUS N/A 03/28/2020  Procedure: UPPER ENDOSCOPIC ULTRASOUND (EUS) RADIAL;  Surgeon: Milus Banister, MD;  Location: [facility];  Service: Endoscopy;  Laterality: N/A;  IR REMOVAL TUN ACCESS W/ PORT W/O FL MOD SED  10/30/2021  no surgical history   HPI: Pt is a 61 y.o. 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  Husband at bedside who provides the history.  Patient has had a chronic cough for couple of months.  She was known to Oncology previously under my care for metastatic recurrent squamous cell carcinoma of the cervix and switched her care to [facility] oncology.  She has also had recent multiple hospitalization at [facility] due to intractable nausea vomiting as well as E. coli UTI.  Last chemotherapy was on 10/17/2021, she received carboplatin/Taxol/bevacizumab.  Patient has a Mediport.  She has had intractable  nausea vomiting despite utilizing her home antiemetics including Zofran, dexamethasone, olanzapine, compazine, Phenergan suppository, scopolamine patch.  Patient has also had cough which triggers for vomiting.   CT of Chest: Large left pleural effusion with associated atelectasis.  2. Bilateral solid pulmonary nodules are decreased in size or no  longer present.  3. Dilated esophagus with air-fluid level, findings can be seen in  the setting of esophageal dysmotility.  4. Mildly heterogeneous thyroid gland, decreased in size when  compared with prior chest CT.   ENT was consulted during this admit for hoarsensss and cough w/ N/V.  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  Subjective: pt awake/alert, verbal. Spanish Interpreter present w/ her during the exam.  Recommendations for follow up therapy are one component of a multi-disciplinary discharge planning process, led by the attending physician.  Recommendations may be updated based on patient status, additional functional criteria and insurance authorization. Assessment / Plan / Recommendation   10/31/2021   2:00 PM Clinical Impressions Clinical Impression Patient presents with grossly functional oropharyngeal swallowing for age. Oral stage is characterized by adequate lip closure, bolus preparation and containment, and anterior to posterior transit. Swallow initiation occurs at the level of the valleculae. Pharyngeal stage is noted for mildly reduced tongue base retraction (likely age-related), adequate hyolaryngeal excursion, and adequate pharyngeal constriction. Epiglottic deflection is complete; there is no penetration or aspiration. There is mild valleculae and intermittently pyriform sinus residue, greater with thicker and solid consistencies, which is cleared by dry swallow. Pharyngeal stripping wave is complete. Amplitude/duration of cricopharyngeus opening is WFL. There is  adequate/complete clearance through the cervical esophagus. An esophageal sweep was performed in the upright lateral position which was unremarkable. 13 mm barium tablet was retained in the valleculae but pt cleared independently with subsequent swallow. Consistencies tested were thin liquids x2 tsps, 1 cup sip, 3 sequential sips, nectar x1 tsp, 1 cup sip, 3 sequential cup sips, honey x1 tsp, pudding x1 tsp, regular solid (1/2 graham cracker with pudding), and 13 mm barium tablet with cup sips  of nectar. Pt reported globus sensation at level of thyroid notch even when no contrast/residue remained in the pharynx. Delayed throat clearing ~3-5 minutes after presentation of POs; patient has esophageal w/u pending. Recommend patient continue regular diet with thin liquids; educated pt verbally and in handout form re: strategies including moistening dry meats, alternating solids and liquids, swallowing twice. No further STservices indicated. SLP Visit Diagnosis Dysphagia, unspecified (R13.10) Impact on safety and function --     10/31/2021   2:00 PM Treatment Recommendations Treatment Recommendations No treatment recommended at this time     10/31/2021   2:00 PM Prognosis Prognosis for Safe Diet Advancement Good Barriers to Reach Goals Time post onset;Severity of deficits Barriers/Prognosis Comment N/V; dry heaves - Ca related   10/31/2021   2:00 PM Diet Recommendations SLP Diet Recommendations Regular solids;Thin liquid Liquid Administration via Cup;Straw Medication Administration Whole meds with puree Compensations Minimize environmental distractions;Slow rate;Small sips/bites;Follow solids with liquid Postural Changes Remain semi-upright after after feeds/meals (Comment);Seated upright at 90 degrees     10/31/2021   2:00 PM Other Recommendations Recommended Consults Consider GI evaluation Oral Care Recommendations Oral care BID;Oral care before and after PO;Patient independent with oral care Other Recommendations -- Follow Up  Recommendations No SLP follow up Assistance recommended at discharge PRN Functional Status Assessment Patient has not had a recent decline in their functional status   10/31/2021   2:00 PM Frequency and Duration  Speech Therapy Frequency (ACUTE ONLY) -- Treatment Duration --     10/31/2021   2:00 PM Oral Phase Oral Phase [facility]    10/31/2021   2:00 PM Pharyngeal Phase Pharyngeal Phase [facility]    10/31/2021   2:00 PM Cervical Esophageal Phase  Cervical Esophageal Phase [facility] Happi Overton 11/03/2021, 2:33 PM                     US THORACENTESIS ASP PLEURAL SPACE W/IMG GUIDE  Result Date: 10/31/2021 INDICATION: Left pleural effusion EXAM: ULTRASOUND GUIDED LEFT THORACENTESIS MEDICATIONS: 8 cc 1% lidocaine COMPLICATIONS: None immediate. PROCEDURE: An ultrasound guided thoracentesis was thoroughly discussed with the patient and questions answered. The benefits, risks, alternatives and complications were also discussed. The patient understands and wishes to proceed with the procedure. Written consent was obtained. Ultrasound was performed to localize and mark an adequate pocket of fluid in the left chest. The area was then prepped and draped in the normal sterile fashion. 1% Lidocaine was used for local anesthesia. Under ultrasound guidance a 6 Fr Safe-T-Centesis catheter was introduced. Thoracentesis was performed. The catheter was removed and a dressing applied. FINDINGS: A total of approximately 500 mL of yellow fluid was removed. Samples were sent to the laboratory as requested by the clinical team IMPRESSION: Successful ultrasound guided left thoracentesis yielding 500 mL of pleural fluid. Follow-up chest x-ray revealed no pneumothorax. Read by: Reatha Armour, PA-C Electronically Signed   By: Corrie Mckusick D.O.   On: 10/31/2021 13:10   DG Chest Port 1 View  Result Date: 10/31/2021 CLINICAL DATA:  Status post thoracentesis. EXAM: PORTABLE CHEST 1 VIEW COMPARISON:  10/28/2021. FINDINGS: Large left pleural effusion with overlying  left basilar opacities. Right lung is clear. Prominent right-sided nipple shadow. No visible pneumothorax. Cardiomediastinal silhouette is partially obscured, but enlarged. Contrast and partially imaged stomach and bowel. IMPRESSION: Large left pleural effusion with overlying left basilar opacities. No visible pneumothorax. Electronically Signed   By: Margaretha Sheffield M.D.   On: 10/31/2021 12:51   CT CHEST W CONTRAST  Result Date: 10/30/2021 CLINICAL DATA:  Vocal cord paralysis, history of breast cancer with pulmonary metastasis; * Tracking Code: BO * EXAM: CT CHEST WITH CONTRAST TECHNIQUE: Multidetector CT imaging of the chest was performed during intravenous contrast administration. RADIATION DOSE REDUCTION: This exam was performed according to the departmental dose-optimization program which includes automated exposure control, adjustment of the mA and/or kV according to patient size and/or use of iterative reconstruction technique. CONTRAST:  58m OMNIPAQUE IOHEXOL 300 MG/ML  SOLN COMPARISON:  Chest CT dated August 10, 2021 FINDINGS: Cardiovascular: Normal heart size. No pericardial effusion. No coronary artery calcifications. Normal caliber thoracic aorta with mild atherosclerotic disease. Mediastinum/Nodes: Dilated esophagus with air-fluid level. Mildly heterogeneous thyroid gland which is decreased in size when compared with prior CT. No pathologically enlarged lymph nodes seen in the chest. Lungs/Pleura: Central airways are patent. Large left pleural effusion with atelectasis. Bilateral solid pulmonary nodules are decreased or no longer present, although portions of the left lung are not visualized due to large pleural effusion which somewhat limits evaluation. Reference subpleural nodule of the left upper lobe measuring 5 mm on series 4, image 17, previously measured 9 mm. Upper Abdomen: Cholelithiasis.  No acute abnormality. Musculoskeletal: Soft tissue gas of the right anterior chest wall, compatible  with history of recent port removal. No acute or significant osseous findings. IMPRESSION: 1. Large left pleural effusion with associated atelectasis. 2. Bilateral solid pulmonary nodules are decreased in size or no longer present. 3. Dilated esophagus with air-fluid level, findings can be seen in the setting of esophageal dysmotility. 4. Mildly heterogeneous thyroid gland, decreased in size when compared with prior chest CT. 5. Aortic Atherosclerosis (ICD10-I70.0). Electronically Signed   By: LYetta GlassmanM.D.   On: 10/30/2021 16:40   CT SOFT TISSUE NECK W CONTRAST  Result Date: 10/30/2021 CLINICAL DATA:  Vocal cord paralysis, history of breast cancer EXAM: CT NECK WITH CONTRAST TECHNIQUE: Multidetector CT imaging of the neck was performed using the standard protocol following the bolus administration of intravenous contrast. RADIATION DOSE REDUCTION: This exam was performed according to the departmental dose-optimization program which includes automated exposure control, adjustment of the mA and/or kV according to patient size and/or use of iterative reconstruction technique. CONTRAST:  722mOMNIPAQUE IOHEXOL 300 MG/ML  SOLN COMPARISON:  Correlation made with CTA neck from 2021 FINDINGS: Pharynx and larynx: No mass or swelling. Appearance suggestive of left vocal cord weakness or paralysis. Salivary glands: Parotid and submandibular glands are unremarkable. Thyroid: Unremarkable. Lymph nodes: A mildly enlarged left supraclavicular node was not present on the prior study. This measures 1.2 cm on series 3, image 74. Vascular: Major neck vessels are patent. Partially retropharyngeal course of the ICAs. Limited intracranial: No abnormal enhancement. 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 Visualized orbits: Unremarkable. Mastoids and visualized paranasal sinuses: No significant opacification. Skeleton: No acute osseous abnormality. Upper chest: Postprocedural changes of same day chest port removal. Refer to concurrent  dedicated chest CT. Other: None. IMPRESSION: Possible left vocal cord weakness or paralysis. Correlate with clinical exam. Indeterminate mildly enlarged left supraclavicular lymph node. Electronically Signed   By: PrMacy Mis.D.   On: 10/30/2021 16:33   IR REMOVAL TUN ACCESS W/ PORT W/O FL MOD SED  Result Date: 10/30/2021 INDICATION: Port removal, infection EXAM: Port removal MEDICATIONS: Per EMR ANESTHESIA/SEDATION: Moderate (conscious) sedation was employed during this procedure. A total of Versed 1 mg and Fentanyl 50 mcg was administered intravenously. Moderate Sedation Time: 12 minutes. The patient's level of consciousness and vital signs were  monitored continuously by radiology nursing throughout the procedure under my direct supervision. FLUOROSCOPY TIME:  N/a COMPLICATIONS: None immediate. PROCEDURE: Informed written consent was obtained from the patient after a thorough discussion of the procedural risks, benefits and alternatives. All questions were addressed. Maximal Sterile Barrier Technique was utilized including caps, mask, sterile gowns, sterile gloves, sterile drape, hand hygiene and skin antiseptic. A timeout was performed prior to the initiation of the procedure. The right chest was prepped and draped in the standard sterile fashion. Lidocaine was utilized for local anesthesia. An incision was made over the previously healed surgical incision. Utilizing blunt dissection, the port catheter and reservoir were removed from the underlying subcutaneous tissue in their entirety. After hemostasis, the subcutaneous pockets was closed in two layers using 3-0 and 4-0 Vicryl. Dermabond was also applied. The patient tolerated the procedure well without immediate complication. IMPRESSION: Successful removal of right-sided chest port. The catheter tip and port were sent for microbiology culture. Electronically Signed   By: Albin Felling M.D.   On: 10/30/2021 16:14   MR BRAIN WO CONTRAST  Result Date:  10/29/2021 CLINICAL DATA:  Headache EXAM: MRI HEAD WITHOUT CONTRAST TECHNIQUE: Multiplanar, multiecho pulse sequences of the brain and surrounding structures were obtained without intravenous contrast. COMPARISON:  None Available. FINDINGS: Brain: No acute infarct, mass effect or extra-axial collection. No acute or chronic hemorrhage. There is multifocal hyperintense T2-weighted signal within the white matter. Parenchymal volume and CSF spaces are normal. The midline structures are normal. Vascular: Major flow voids are preserved. Skull and upper cervical spine: Normal calvarium and skull base. Visualized upper cervical spine and soft tissues are normal. Sinuses/Orbits:No paranasal sinus fluid levels or advanced mucosal thickening. No mastoid or middle ear effusion. Normal orbits. IMPRESSION: 1. No acute intracranial abnormality. 2. Multifocal hyperintense T2-weighted signal within the white matter, most commonly seen in the setting of chronic small vessel ischemia. Electronically Signed   By: Ulyses Jarred M.D.   On: 10/29/2021 22:17   ECHOCARDIOGRAM COMPLETE  Result Date: 10/29/2021    ECHOCARDIOGRAM REPORT   Patient Name:   Jill Rodriguez Date of Exam: 10/29/2021 Medical Rec #:  [id number]           Height:       64.0 in Accession #:    [id number]          Weight:       160.9 lb Date of Birth:  Sep 17, 1960           BSA:          1.784 m Patient Age:    64 years            BP:           151/76 mmHg Patient Gender: F                   HR:           90 bpm. Exam Location:  [facility] Procedure: 2D Echo, Color Doppler and Cardiac Doppler Indications:     R00.8 Other abnormalities of the heart  History:         Patient has no prior history of Echocardiogram examinations.                  Signs/Symptoms:Candidemia.  Sonographer:     Charmayne Sheer Referring Phys:  [id number] CALDWELL POWELL JR Diagnosing Phys: Kate Sable MD  Sonographer Comments: Suboptimal parasternal window and suboptimal subcostal window. IMPRESSIONS   1. Left ventricular  ejection fraction, by estimation, is 50 to 55%. The left ventricle has low normal function. The left ventricle has no regional wall motion abnormalities. There is mild left ventricular hypertrophy. Left ventricular diastolic parameters are consistent with Grade I diastolic dysfunction (impaired relaxation).  2. Right ventricular systolic function is normal. The right ventricular size is normal.  3. The mitral valve is normal in structure. No evidence of mitral valve regurgitation.  4. The aortic valve was not well visualized. Aortic valve regurgitation is not visualized.  5. The inferior vena cava is normal in size with greater than 50% respiratory variability, suggesting right atrial pressure of 3 mmHg. FINDINGS  Left Ventricle: Left ventricular ejection fraction, by estimation, is 50 to 55%. The left ventricle has low normal function. The left ventricle has no regional wall motion abnormalities. The left ventricular internal cavity size was normal in size. There is mild left ventricular hypertrophy. Left ventricular diastolic parameters are consistent with Grade I diastolic dysfunction (impaired relaxation). Right Ventricle: The right ventricular size is normal. No increase in right ventricular wall thickness. Right ventricular systolic function is normal. Left Atrium: Left atrial size was normal in size. Right Atrium: Right atrial size was normal in size. Pericardium: There is no evidence of pericardial effusion. Mitral Valve: The mitral valve is normal in structure. No evidence of mitral valve regurgitation. Tricuspid Valve: The tricuspid valve is not well visualized. Tricuspid valve regurgitation is not demonstrated. Aortic Valve: The aortic valve was not well visualized. Aortic valve regurgitation is not visualized. Aortic valve mean gradient measures 3.0 mmHg. Aortic valve peak gradient measures 6.6 mmHg. Aortic valve area, by VTI measures 2.27 cm. Pulmonic Valve: The pulmonic valve was  not well visualized. Pulmonic valve regurgitation is not visualized. Aorta: The aortic root and ascending aorta are structurally normal, with no evidence of dilitation. Venous: The inferior vena cava is normal in size with greater than 50% respiratory variability, suggesting right atrial pressure of 3 mmHg. IAS/Shunts: No atrial level shunt detected by color flow Doppler.  LEFT VENTRICLE PLAX 2D LVIDd:         3.00 cm   Diastology LVIDs:         2.51 cm   LV e' medial:    4.68 cm/s LV PW:         1.12 cm   LV E/e' medial:  15.0 LV IVS:        0.73 cm   LV e' lateral:   5.22 cm/s LVOT diam:     1.90 cm   LV E/e' lateral: 13.4 LV SV:         48 LV SV Index:   27 LVOT Area:     2.84 cm  RIGHT VENTRICLE RV Basal diam:  2.46 cm LEFT ATRIUM             Index        RIGHT ATRIUM          Index LA diam:        3.20 cm 1.79 cm/m   RA Area:     8.72 cm LA Vol (A2C):   24.0 ml 13.45 ml/m  RA Volume:   16.10 ml 9.03 ml/m LA Vol (A4C):   59.9 ml 33.58 ml/m LA Biplane Vol: 41.9 ml 23.49 ml/m  AORTIC VALVE                    PULMONIC VALVE AV Area (Vmax):    2.26 cm  PV Vmax:       0.99 m/s AV Area (Vmean):   2.33 cm     PV Peak grad:  3.9 mmHg AV Area (VTI):     2.27 cm AV Vmax:           128.00 cm/s AV Vmean:          83.700 cm/s AV VTI:            0.212 m AV Peak Grad:      6.6 mmHg AV Mean Grad:      3.0 mmHg LVOT Vmax:         102.00 cm/s LVOT Vmean:        68.700 cm/s LVOT VTI:          0.170 m LVOT/AV VTI ratio: 0.80  AORTA Ao Root diam: 3.40 cm MITRAL VALVE MV Area (PHT): 10.18 cm    SHUNTS MV Decel Time: 75 msec      Systemic VTI:  0.17 m MV E velocity: 70.05 cm/s   Systemic Diam: 1.90 cm MV A velocity: 117.50 cm/s MV E/A ratio:  0.60 Kate Sable MD Electronically signed by Kate Sable MD Signature Date/Time: 10/29/2021/3:32:34 PM    Final    DG Chest Portable 1 View  Result Date: 10/28/2021 CLINICAL DATA:  Cough EXAM: PORTABLE CHEST 1 VIEW COMPARISON:  08/10/2021 FINDINGS: Interval placement of  a right-sided chest port with distal tip terminating near the level of the superior cavoatrial junction. Low lung volumes with chronic elevation of the left hemidiaphragm. Heart size and mediastinal contours are largely obscured. Dense left basilar opacification may reflect atelectasis, consolidation, and or effusion. No pneumothorax. IMPRESSION: Low lung volumes with chronic elevation of the left hemidiaphragm. Dense left basilar opacification may reflect atelectasis, consolidation, and/or effusion. Electronically Signed   By: Davina Poke D.O.   On: 10/28/2021 16:42   CT HEAD WO CONTRAST  Result Date: 10/28/2021 CLINICAL DATA:  61 year old female with headache following injury. Initial encounter. EXAM: CT HEAD WITHOUT CONTRAST TECHNIQUE: Contiguous axial images were obtained from the base of the skull through the vertex without intravenous contrast. RADIATION DOSE REDUCTION: This exam was performed according to the departmental dose-optimization program which includes automated exposure control, adjustment of the mA and/or kV according to patient size and/or use of iterative reconstruction technique. COMPARISON:  None Available. FINDINGS: Brain: No evidence of acute infarction, hemorrhage, hydrocephalus, extra-axial collection or mass lesion/mass effect. The cerebellar tonsils appear low lying. Vascular: Carotid and vertebral atherosclerotic calcifications are noted. Skull: Normal. Negative for fracture or focal lesion. Sinuses/Orbits: No acute finding. Other: None. IMPRESSION: 1. No evidence of acute intracranial abnormality. 2. 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 Consider MRI for further evaluation as indicated. Electronically Signed   By: Margarette Canada M.D.   On: 10/28/2021 16:03    Microbiology: Results for orders placed or performed during the hospital encounter of 10/28/21  Urine Culture     Status: Abnormal   Collection Time: 10/28/21  3:30 PM   Specimen: Urine, Clean  Catch  Result Value Ref Range Status   Specimen Description   Final    URINE, CLEAN CATCH Performed at [facility], [address], [place], [place] [address]    Special Requests   Final    NONE Performed at [facility], [address] [place], [place] [address]    Culture (A)  Final    >=100,000 COLONIES/mL ESCHERICHIA COLI Confirmed Extended Spectrum Beta-Lactamase Producer (ESBL).  In bloodstream infections  from ESBL organisms, carbapenems are preferred over piperacillin/tazobactam. They are shown to have a lower risk of mortality.    Report Status 10/31/2021 FINAL  Final   Organism ID, Bacteria ESCHERICHIA COLI (A)  Final      Susceptibility   Escherichia coli - MIC*    AMPICILLIN >=32 RESISTANT Resistant     CEFAZOLIN >=64 RESISTANT Resistant     CEFEPIME 16 RESISTANT Resistant     CEFTRIAXONE >=64 RESISTANT Resistant     CIPROFLOXACIN 2 RESISTANT Resistant     GENTAMICIN >=16 RESISTANT Resistant     IMIPENEM <=0.25 SENSITIVE Sensitive     NITROFURANTOIN 32 SENSITIVE Sensitive     TRIMETH/SULFA >=320 RESISTANT Resistant     AMPICILLIN/SULBACTAM >=32 RESISTANT Resistant     PIP/TAZO <=4 SENSITIVE Sensitive     * >=100,000 COLONIES/mL ESCHERICHIA COLI  SARS Coronavirus 2 by RT PCR (hospital order, performed in [facility] hospital lab) *cepheid single result test* Anterior Nasal Swab     Status: None   Collection Time: 10/28/21  6:51 PM   Specimen: Anterior Nasal Swab  Result Value Ref Range Status   SARS Coronavirus 2 by RT PCR NEGATIVE NEGATIVE Final    Comment: (NOTE) SARS-CoV-2 target nucleic acids are NOT DETECTED.  The SARS-CoV-2 RNA is generally detectable in upper and lower respiratory specimens during the acute phase of infection. The lowest concentration of SARS-CoV-2 viral copies this assay can detect is 250 copies / mL. A negative result does not preclude SARS-CoV-2 infection and should not be used as the sole basis for treatment or  other patient management decisions.  A negative result may occur with improper specimen collection / handling, submission of specimen other than nasopharyngeal swab, presence of viral mutation(s) within the areas targeted by this assay, and inadequate number of viral copies (<250 copies / mL). A negative result must be combined with clinical observations, patient history, and epidemiological information.  Fact Sheet for Patients:   https://www.patel.info/  Fact Sheet for Healthcare Providers: https://hall.com/  This test is not yet approved or  cleared by the [place] FDA and has been authorized for detection and/or diagnosis of SARS-CoV-2 by FDA under an Emergency Use Authorization (EUA).  This EUA will remain in effect (meaning this test can be used) for the duration of the COVID-19 declaration under Section 564(b)(1) of the Act, 21 U.S.C. section 360bbb-3(b)(1), unless the authorization is terminated or revoked sooner.  Performed at [facility], [address] [place], [place] [address]   Blood culture (routine x 2)     Status: Abnormal   Collection Time: 10/28/21  8:25 PM   Specimen: BLOOD  Result Value Ref Range Status   Specimen Description   Final    BLOOD LEFT ASSIST CONTROL Performed at [facility], [address], [place], [place] [address]    Special Requests   Final    BOTTLES DRAWN AEROBIC AND ANAEROBIC Blood Culture adequate volume Performed at [facility] [address] [place], [place] [address]    Culture  Setup Time   Final    YEAST IN BOTH AEROBIC AND ANAEROBIC BOTTLES CRITICAL VALUE NOTED.  VALUE IS CONSISTENT WITH PREVIOUSLY REPORTED AND CALLED VALUE. Performed at [facility], [address], [place], [place] [address]    Culture (A)  Final    CANDIDA ALBICANS SEE SEPARATE REPORT Performed at [facility], [place] [address] [place], [place] [address]     Report Status 11/08/2021 FINAL  Final  Blood culture (routine x 2)     Status: Abnormal   Collection Time: 10/28/21  8:25 PM   Specimen: BLOOD  Result Value Ref Range Status   Specimen Description   Final    BLOOD PORTA CATH Performed at [facility], [address], [place], [place] [address]    Special Requests   Final    BOTTLES DRAWN AEROBIC AND ANAEROBIC Blood Culture adequate volume Performed at [facility], [address] [place], [place] [address]    Culture  Setup Time   Final    Organism ID to follow IN BOTH AEROBIC AND ANAEROBIC BOTTLES YEAST CRITICAL RESULT CALLED TO, READ BACK BY AND VERIFIED WITH: Carey Bullocks 10/29/2021 at 1137 SRR Performed at [facility], [address] [place], [place] [address]    Culture (A)  Final    CANDIDA ALBICANS SEE SEPARATE REPORT Performed at [facility], [place] [address], [place], [place] [address]    Report Status 11/08/2021 FINAL  Final  Blood Culture ID Panel (Reflexed)     Status: Abnormal   Collection Time: 10/28/21  8:25 PM  Result Value Ref Range Status   Enterococcus faecalis NOT DETECTED NOT DETECTED Final   Enterococcus Faecium NOT DETECTED NOT DETECTED Final   Listeria monocytogenes NOT DETECTED NOT DETECTED Final   Staphylococcus species NOT DETECTED NOT DETECTED Final   Staphylococcus aureus (BCID) NOT DETECTED NOT DETECTED Final   Staphylococcus epidermidis NOT DETECTED NOT DETECTED Final   Staphylococcus lugdunensis NOT DETECTED NOT DETECTED Final   Streptococcus species NOT DETECTED NOT DETECTED Final   Streptococcus agalactiae NOT DETECTED NOT DETECTED Final   Streptococcus pneumoniae NOT DETECTED NOT DETECTED Final   Streptococcus pyogenes NOT DETECTED NOT DETECTED Final   A.calcoaceticus-baumannii NOT DETECTED NOT DETECTED Final   Bacteroides fragilis NOT DETECTED NOT DETECTED Final   Enterobacterales NOT DETECTED NOT DETECTED Final   Enterobacter cloacae complex NOT  DETECTED NOT DETECTED Final   Escherichia coli NOT DETECTED NOT DETECTED Final   Klebsiella aerogenes NOT DETECTED NOT DETECTED Final   Klebsiella oxytoca NOT DETECTED NOT DETECTED Final   Klebsiella pneumoniae NOT DETECTED NOT DETECTED Final   Proteus species NOT DETECTED NOT DETECTED Final   Salmonella species NOT DETECTED NOT DETECTED Final   Serratia marcescens NOT DETECTED NOT DETECTED Final   Haemophilus influenzae NOT DETECTED NOT DETECTED Final   Neisseria meningitidis NOT DETECTED NOT DETECTED Final   Pseudomonas aeruginosa NOT DETECTED NOT DETECTED Final   Stenotrophomonas maltophilia NOT DETECTED NOT DETECTED Final   Candida albicans DETECTED (A) NOT DETECTED Final    Comment: CRITICAL RESULT CALLED TO, READ BACK BY AND VERIFIED WITH: Carey Bullocks 10/29/2021 at 1137 SRR    Candida auris NOT DETECTED NOT DETECTED Final   Candida glabrata NOT DETECTED NOT DETECTED Final   Candida krusei NOT DETECTED NOT DETECTED Final   Candida parapsilosis NOT DETECTED NOT DETECTED Final   Candida tropicalis NOT DETECTED NOT DETECTED Final   Cryptococcus neoformans/gattii NOT DETECTED NOT DETECTED Final    Comment: Performed at [facility], [address] [place], [place] [address]  Antifungal AST 9 Drug Panel     Status: None   Collection Time: 10/28/21  8:25 PM  Result Value Ref Range Status   Organism ID, Yeast Candida albicans  Corrected    Comment: (NOTE) Identification performed by account, not confirmed by this laboratory. CORRECTED ON 07/13 AT 0636: PREVIOUSLY REPORTED AS Preliminary report  Amphotericin B MIC 0.5 ug/mL  Final    Comment: (NOTE) Breakpoints have been established for only some organism-drug combinations as indicated. This test was developed and its performance characteristics determined by [facility]. It has not been cleared or approved by the Food and Drug Administration.    Anidulafungin MIC Comment  Final    Comment: (NOTE) 0.03 ug/mL  Susceptible Breakpoints have been established for only some organism-drug combinations as indicated. This test was developed and its performance characteristics determined by [facility]. It has not been cleared or approved by the Food and Drug Administration.    Caspofungin MIC Comment  Final    Comment: (NOTE) 0.06 ug/mL Susceptible Breakpoints have been established for only some organism-drug combinations as indicated. This test was developed and its performance characteristics determined by [facility]. It has not been cleared or approved by the Food and Drug Administration.    Micafungin MIC Comment  Final    Comment: (NOTE) 0.016 ug/mL Susceptible Breakpoints have been established for only some organism-drug combinations as indicated. This test was developed and its performance characteristics determined by [facility]. It has not been cleared or approved by the Food and Drug Administration.    Posaconazole MIC 0.06 ug/mL  Final    Comment: (NOTE) Breakpoints have been established for only some organism-drug combinations as indicated. This test was developed and its performance characteristics determined by [facility]. It has not been cleared or approved by the Food and Drug Administration.    Fluconazole Islt MIC 0.5 ug/mL Susceptible  Final    Comment: (NOTE) Breakpoints have been established for only some organism-drug combinations as indicated. This test was developed and its performance characteristics determined by [facility]. It has not been cleared or approved by the Food and Drug Administration.    Flucytosine MIC 0.12 ug/mL  Final    Comment: (NOTE) Breakpoints have been established for only some organism-drug combinations as indicated. This test was developed and its performance characteristics determined by [facility]. It has not been cleared or approved by the Food and Drug Administration.    Itraconazole MIC 0.12 ug/mL  Final    Comment: (NOTE) Breakpoints have been  established for only some organism-drug combinations as indicated. This test was developed and its performance characteristics determined by [facility]. It has not been cleared or approved by the Food and Drug Administration.    Voriconazole MIC Comment  Final    Comment: (NOTE) 0.016 ug/mL Susceptible Breakpoints have been established for only some organism-drug combinations as indicated. This test was developed and its performance characteristics determined by [facility]. It has not been cleared or approved by the Food and Drug Administration. Performed At: [facility] [place], [place] [id number] Rush Farmer MD OF:[id number]    Source BLOOD  Final    Comment: Performed at [facility], [place] [address], [place], [place] [address]  MRSA Next Gen by PCR, Nasal     Status: None   Collection Time: 10/29/21 12:11 PM   Specimen: Nasal Mucosa; Nasal Swab  Result Value Ref Range Status   MRSA by PCR Next Gen NOT DETECTED NOT DETECTED Final    Comment: (NOTE) The GeneXpert MRSA Assay (FDA approved for NASAL specimens only), is one component of a comprehensive MRSA colonization surveillance program. It is not intended to diagnose MRSA infection nor to guide or monitor treatment for MRSA infections. Test performance is not FDA approved in patients less than 2 years old. Performed at [facility], [address] [place],  [place] [address]   Culture, blood (Routine X 2) w Reflex to ID Panel     Status: None   Collection Time: 10/29/21  2:41 PM   Specimen: BLOOD  Result Value Ref Range Status   Specimen Description BLOOD RIGHT ANTECUBITAL  Final   Special Requests   Final    BOTTLES DRAWN AEROBIC AND ANAEROBIC Blood Culture adequate volume   Culture   Final    NO GROWTH 5 DAYS Performed at [facility], [address] [place], [place] [address]    Report Status 11/03/2021 FINAL  Final  Culture, blood (Routine X 2) w Reflex to ID Panel      Status: None   Collection Time: 10/29/21  3:00 PM   Specimen: BLOOD  Result Value Ref Range Status   Specimen Description BLOOD LEFT ANTECUBITAL  Final   Special Requests   Final    BOTTLES DRAWN AEROBIC AND ANAEROBIC Blood Culture adequate volume   Culture   Final    NO GROWTH 5 DAYS Performed at [facility], [address] [place], [place] [address]    Report Status 11/03/2021 FINAL  Final  Culture, blood (Routine X 2) w Reflex to ID Panel     Status: None   Collection Time: 10/30/21  5:14 AM   Specimen: BLOOD  Result Value Ref Range Status   Specimen Description BLOOD LEFT [facility]  Final   Special Requests   Final    BOTTLES DRAWN AEROBIC AND ANAEROBIC Blood Culture adequate volume   Culture   Final    NO GROWTH 5 DAYS Performed at [facility], [address], [place], [place] [address]    Report Status 11/04/2021 FINAL  Final  Culture, blood (Routine X 2) w Reflex to ID Panel     Status: None   Collection Time: 10/30/21  5:15 AM   Specimen: BLOOD  Result Value Ref Range Status   Specimen Description BLOOD LEFT HAND  Final   Special Requests   Final    BOTTLES DRAWN AEROBIC AND ANAEROBIC Blood Culture adequate volume   Culture   Final    NO GROWTH 5 DAYS Performed at [facility], [address], [place], [place] [address]    Report Status 11/04/2021 FINAL  Final  Cath Tip Culture     Status: Abnormal   Collection Time: 10/30/21  3:51 PM   Specimen: Catheter Tip  Result Value Ref Range Status   Specimen Description CATH TIP  Final   Special Requests   Final    CHEST PORT CATH TIP Performed at [facility], [place] [address], [place], [place] [address]    Culture >=100,000 COLONIES/mL CANDIDA ALBICANS (A)  Final   Report Status 11/02/2021 FINAL  Final  Body fluid culture w Gram Stain     Status: None   Collection Time: 10/31/21 12:25 PM   Specimen: PATH Cytology Pleural fluid  Result Value Ref Range Status   Specimen Description    Final    PLEURAL Performed at [facility], [address], [place], [place] [address]    Special Requests   Final    NONE Performed at [facility], [place], [place] [address]    Gram Stain NO WBC SEEN NO ORGANISMS SEEN   Final   Culture   Final    NO GROWTH 3 DAYS Performed at [facility], [place] [address] [place], [place] [address]    Report  Status 11/04/2021 FINAL  Final    Labs: CBC: Recent Labs  Lab 11/04/21 0502 11/05/21 0506  WBC 2.5* 2.9*  NEUTROABS  --  1.9  HGB 7.5* 7.8*  HCT 23.2* 23.7*  MCV 94.7 94.8  PLT 36* 41*   Basic Metabolic Panel: Recent Labs  Lab 11/04/21 0502 11/05/21 0506  NA 135 135  K 3.4* 3.9  CL 101 100  CO2 29 29  GLUCOSE 98 108*  BUN 11 11  CREATININE 0.91 1.01*  CALCIUM 8.2* 8.5*  MG 2.0  --    Liver Function Tests: Recent Labs  Lab 11/04/21 0502  AST 28  ALT 23  ALKPHOS 80  BILITOT 0.7  PROT 5.4*  ALBUMIN 2.6*   CBG: No results for input(s): "GLUCAP" in the last 168 hours.  Discharge time spent: less than 30 minutes.  Signed: Vanna Scotland, MD [facility] 11/10/2021

## 2021-11-10 NOTE — TOC Progression Note (Addendum)
Transition of Care [facility]) - Progression Note    Patient Details  Name: Kemiya Batdorf MRN: [id number] Date of Birth: March 02, 1961  Transition of Care [facility]) CM/SW [place], RN Phone Number: 11/10/2021, 2:09 PM  Clinical Narrative:   RNCM spoke with Katherine Roan at [facility], confirmed start of care is tomorrow.  RNCM spoke with Harriet But[place], start of antibiotic infusion at home is tomorrow.  Patient has recommended DME at bedside, BSC/3 n 1 and wheelchair.         Expected Discharge Plan and Services           Expected Discharge Date: 11/10/21                                     Social Determinants of Health (SDOH) Interventions    Readmission Risk Interventions     No data to display

## 2021-11-10 NOTE — Progress Notes (Signed)
Pt being discharged home with home health, discharge instructions reviewed with pt and husband via interpreter services, states understanding, awaiting arrival of daughter for transport, pt with no complaints at this time

## 2021-11-12 ENCOUNTER — Telehealth: Payer: Self-pay

## 2021-11-12 NOTE — Telephone Encounter (Signed)
Patient's daughter called wanting to know when a home health nurse will be out to her mother's home. Sent message to Carolynn Sayers, RN with [facility].   Beryle Flock, RN

## 2021-11-12 NOTE — Telephone Encounter (Signed)
Spoke with Carolynn Sayers, patient is being managed by [facility] and a nurse was supposed to visit the patient's home yesterday 11/11/21.  Left voicemail with Frederico Hamman, RN with [facility] at [phone] requesting callback to find out when nurse will be able to make home visit.   Beryle Flock, RN

## 2021-11-12 NOTE — Telephone Encounter (Signed)
Spoke with Katherine Roan, she states there was a mix up and they did not receive the home health orders from [facility]. She reports that everything is good to go now and that [facility] is planning on seeing the patient tomorrow 7/20 for home visit. Katherine Roan has updated patient's daughter.   Beryle Flock, RN

## 2021-11-13 LAB — MISC LABCORP TEST (SEND OUT): Labcorp test code: 9985

## 2021-11-13 NOTE — Telephone Encounter (Signed)
Thank you :)

## 2021-11-13 NOTE — Telephone Encounter (Signed)
Called back and stated that was fine. -  Estill Bamberg

## 2021-11-13 NOTE — Telephone Encounter (Signed)
Amy, Rn with [facility] called office for orders regarding home health visits. Would like to know if provider is okay with home health coming out two times once a week for picc maintenance and labs weekly.  P: [phone] Leatrice Jewels, RMA

## 2021-11-17 ENCOUNTER — Emergency Department: Payer: BLUE CROSS/BLUE SHIELD

## 2021-11-17 ENCOUNTER — Other Ambulatory Visit: Payer: Self-pay

## 2021-11-17 ENCOUNTER — Inpatient Hospital Stay
Admission: EM | Admit: 2021-11-17 | Discharge: 2021-11-19 | DRG: 392 | Disposition: A | Discharge status: Home Health | Payer: BLUE CROSS/BLUE SHIELD | Attending: Internal Medicine | Admitting: Internal Medicine

## 2021-11-17 DIAGNOSIS — Z923 Personal history of irradiation: Secondary | ICD-10-CM | POA: Diagnosis not present

## 2021-11-17 DIAGNOSIS — Z833 Family history of diabetes mellitus: Secondary | ICD-10-CM | POA: Diagnosis not present

## 2021-11-17 DIAGNOSIS — R9431 Abnormal electrocardiogram [ECG] [EKG]: Secondary | ICD-10-CM | POA: Diagnosis present

## 2021-11-17 DIAGNOSIS — C779 Secondary and unspecified malignant neoplasm of lymph node, unspecified: Secondary | ICD-10-CM | POA: Diagnosis present

## 2021-11-17 DIAGNOSIS — E876 Hypokalemia: Secondary | ICD-10-CM | POA: Diagnosis present

## 2021-11-17 DIAGNOSIS — C539 Malignant neoplasm of cervix uteri, unspecified: Secondary | ICD-10-CM | POA: Diagnosis present

## 2021-11-17 DIAGNOSIS — R112 Nausea with vomiting, unspecified: Secondary | ICD-10-CM | POA: Diagnosis present

## 2021-11-17 DIAGNOSIS — Z6827 Body mass index (BMI) 27.0-27.9, adult: Secondary | ICD-10-CM

## 2021-11-17 DIAGNOSIS — Z8616 Personal history of COVID-19: Secondary | ICD-10-CM

## 2021-11-17 DIAGNOSIS — J383 Other diseases of vocal cords: Secondary | ICD-10-CM | POA: Diagnosis present

## 2021-11-17 DIAGNOSIS — C7801 Secondary malignant neoplasm of right lung: Secondary | ICD-10-CM | POA: Diagnosis present

## 2021-11-17 DIAGNOSIS — J90 Pleural effusion, not elsewhere classified: Secondary | ICD-10-CM | POA: Diagnosis present

## 2021-11-17 DIAGNOSIS — E440 Moderate protein-calorie malnutrition: Secondary | ICD-10-CM | POA: Diagnosis present

## 2021-11-17 DIAGNOSIS — R5381 Other malaise: Secondary | ICD-10-CM | POA: Diagnosis present

## 2021-11-17 DIAGNOSIS — N179 Acute kidney failure, unspecified: Secondary | ICD-10-CM | POA: Diagnosis present

## 2021-11-17 DIAGNOSIS — T451X5A Adverse effect of antineoplastic and immunosuppressive drugs, initial encounter: Secondary | ICD-10-CM | POA: Diagnosis present

## 2021-11-17 DIAGNOSIS — B377 Candidal sepsis: Secondary | ICD-10-CM | POA: Diagnosis present

## 2021-11-17 LAB — CBC WITH DIFFERENTIAL/PLATELET
Abs Immature Granulocytes: 0.07 10*3/uL (ref 0.00–0.07)
Basophils Absolute: 0.1 10*3/uL (ref 0.0–0.1)
Basophils Relative: 1 %
Eosinophils Absolute: 0 10*3/uL (ref 0.0–0.5)
Eosinophils Relative: 1 %
HCT: 25.6 % — ABNORMAL LOW (ref 36.0–46.0)
Hemoglobin: 8.4 g/dL — ABNORMAL LOW (ref 12.0–15.0)
Immature Granulocytes: 1 %
Lymphocytes Relative: 24 %
Lymphs Abs: 1.2 10*3/uL (ref 0.7–4.0)
MCH: 31.1 pg (ref 26.0–34.0)
MCHC: 32.8 g/dL (ref 30.0–36.0)
MCV: 94.8 fL (ref 80.0–100.0)
Monocytes Absolute: 0.9 10*3/uL (ref 0.1–1.0)
Monocytes Relative: 19 %
Neutro Abs: 2.6 10*3/uL (ref 1.7–7.7)
Neutrophils Relative %: 54 %
Platelets: 308 10*3/uL (ref 150–400)
RBC: 2.7 MIL/uL — ABNORMAL LOW (ref 3.87–5.11)
RDW: 18.8 % — ABNORMAL HIGH (ref 11.5–15.5)
WBC: 4.9 10*3/uL (ref 4.0–10.5)
nRBC: 1 % — ABNORMAL HIGH (ref 0.0–0.2)

## 2021-11-17 LAB — BASIC METABOLIC PANEL
Anion gap: 10 (ref 5–15)
BUN: 10 mg/dL (ref 8–23)
CO2: 35 mmol/L — ABNORMAL HIGH (ref 22–32)
Calcium: 8.2 mg/dL — ABNORMAL LOW (ref 8.9–10.3)
Chloride: 90 mmol/L — ABNORMAL LOW (ref 98–111)
Creatinine, Ser: 0.87 mg/dL (ref 0.44–1.00)
GFR, Estimated: >60 mL/min (ref >60)
Glucose, Bld: 105 mg/dL — ABNORMAL HIGH (ref 70–99)
Potassium: 2.7 mmol/L — CL (ref 3.5–5.1)
Sodium: 135 mmol/L (ref 135–145)

## 2021-11-17 LAB — URINALYSIS, COMPLETE (UACMP) WITH MICROSCOPIC
Bacteria, UA: NONE SEEN
Bilirubin Urine: NEGATIVE
Glucose, UA: NEGATIVE mg/dL
Hgb urine dipstick: NEGATIVE
Ketones, ur: 5 mg/dL — AB
Nitrite: NEGATIVE
Protein, ur: 30 mg/dL — AB
Specific Gravity, Urine: 1.009 (ref 1.005–1.030)
pH: 8 (ref 5.0–8.0)

## 2021-11-17 LAB — COMPREHENSIVE METABOLIC PANEL
ALT: 20 U/L (ref 0–44)
AST: 36 U/L (ref 15–41)
Albumin: 3 g/dL — ABNORMAL LOW (ref 3.5–5.0)
Alkaline Phosphatase: 75 U/L (ref 38–126)
Anion gap: 11 (ref 5–15)
BUN: 12 mg/dL (ref 8–23)
CO2: 36 mmol/L — ABNORMAL HIGH (ref 22–32)
Calcium: 8.6 mg/dL — ABNORMAL LOW (ref 8.9–10.3)
Chloride: 87 mmol/L — ABNORMAL LOW (ref 98–111)
Creatinine, Ser: 1.06 mg/dL — ABNORMAL HIGH (ref 0.44–1.00)
GFR, Estimated: 60 mL/min — ABNORMAL LOW (ref >60)
Glucose, Bld: 124 mg/dL — ABNORMAL HIGH (ref 70–99)
Potassium: <2 mmol/L — CL (ref 3.5–5.1)
Sodium: 134 mmol/L — ABNORMAL LOW (ref 135–145)
Total Bilirubin: 0.5 mg/dL (ref 0.3–1.2)
Total Protein: 6.4 g/dL — ABNORMAL LOW (ref 6.5–8.1)

## 2021-11-17 LAB — CBC
HCT: 24.1 % — ABNORMAL LOW (ref 36.0–46.0)
Hemoglobin: 7.7 g/dL — ABNORMAL LOW (ref 12.0–15.0)
MCH: 31.3 pg (ref 26.0–34.0)
MCHC: 32 g/dL (ref 30.0–36.0)
MCV: 98 fL (ref 80.0–100.0)
Platelets: 254 10*3/uL (ref 150–400)
RBC: 2.46 MIL/uL — ABNORMAL LOW (ref 3.87–5.11)
RDW: 18.8 % — ABNORMAL HIGH (ref 11.5–15.5)
WBC: 4.2 10*3/uL (ref 4.0–10.5)
nRBC: 0.7 % — ABNORMAL HIGH (ref 0.0–0.2)

## 2021-11-17 LAB — MAGNESIUM
Magnesium: 1.6 mg/dL — ABNORMAL LOW (ref 1.7–2.4)
Magnesium: 2.3 mg/dL (ref 1.7–2.4)
Magnesium: 2.3 mg/dL (ref 1.7–2.4)

## 2021-11-17 LAB — SARS CORONAVIRUS 2 BY RT PCR: SARS Coronavirus 2 by RT PCR: NEGATIVE

## 2021-11-17 LAB — MRSA NEXT GEN BY PCR, NASAL: MRSA by PCR Next Gen: NOT DETECTED

## 2021-11-17 LAB — BRAIN NATRIURETIC PEPTIDE: B Natriuretic Peptide: 18.3 pg/mL (ref 0.0–100.0)

## 2021-11-17 LAB — PROCALCITONIN: Procalcitonin: <0.1 ng/mL

## 2021-11-17 LAB — POTASSIUM: Potassium: 2.5 mmol/L — CL (ref 3.5–5.1)

## 2021-11-17 LAB — GLUCOSE, CAPILLARY: Glucose-Capillary: 97 mg/dL (ref 70–99)

## 2021-11-17 LAB — TROPONIN I (HIGH SENSITIVITY): Troponin I (High Sensitivity): 13 ng/L (ref <18)

## 2021-11-17 LAB — LIPASE, BLOOD: Lipase: 24 U/L (ref 11–51)

## 2021-11-17 LAB — LACTIC ACID, PLASMA: Lactic Acid, Venous: 1.3 mmol/L (ref 0.5–1.9)

## 2021-11-17 MED ORDER — ONDANSETRON HCL 4 MG/2ML IJ SOLN
4.0000 mg | Freq: Four times a day (QID) | INTRAMUSCULAR | Status: DC | PRN
  Administered 2021-11-17 – 2021-11-18 (×2): 4 mg via INTRAVENOUS
  Filled 2021-11-17 (×3): qty 2

## 2021-11-17 MED ORDER — IOHEXOL 300 MG/ML  SOLN
100.0000 mL | Freq: Once | INTRAMUSCULAR | Status: AC | PRN
Start: 2021-11-17 — End: 2021-11-17
  Administered 2021-11-17: 100 mL via INTRAVENOUS

## 2021-11-17 MED ORDER — CHLORHEXIDINE GLUCONATE CLOTH 2 % EX PADS
6.0000 | MEDICATED_PAD | Freq: Every day | CUTANEOUS | Status: DC
  Administered 2021-11-17 – 2021-11-19 (×3): 6 via TOPICAL

## 2021-11-17 MED ORDER — ENOXAPARIN SODIUM 40 MG/0.4ML IJ SOSY
40.0000 mg | PREFILLED_SYRINGE | INTRAMUSCULAR | Status: DC
  Administered 2021-11-17 – 2021-11-19 (×3): 40 mg via SUBCUTANEOUS
  Filled 2021-11-17 (×3): qty 0.4

## 2021-11-17 MED ORDER — POTASSIUM CHLORIDE 10 MEQ/100ML IV SOLN
10.0000 meq | INTRAVENOUS | Status: AC
  Administered 2021-11-17 (×3): 10 meq via INTRAVENOUS
  Filled 2021-11-17 (×3): qty 100

## 2021-11-17 MED ORDER — SODIUM CHLORIDE 0.9 % IV BOLUS
500.0000 mL | Freq: Once | INTRAVENOUS | Status: AC
  Administered 2021-11-17: 500 mL via INTRAVENOUS

## 2021-11-17 MED ORDER — ACETAMINOPHEN 650 MG RE SUPP: 650.0000 mg | Freq: Four times a day (QID) | RECTAL | Status: DC | PRN

## 2021-11-17 MED ORDER — PROCHLORPERAZINE EDISYLATE 10 MG/2ML IJ SOLN
10.0000 mg | Freq: Four times a day (QID) | INTRAMUSCULAR | Status: DC | PRN
  Administered 2021-11-17 – 2021-11-18 (×5): 10 mg via INTRAVENOUS
  Filled 2021-11-17 (×5): qty 2

## 2021-11-17 MED ORDER — POTASSIUM CHLORIDE 2 MEQ/ML IV SOLN
INTRAVENOUS | Status: DC
  Filled 2021-11-17 (×2): qty 1000

## 2021-11-17 MED ORDER — MORPHINE SULFATE (PF) 4 MG/ML IV SOLN
4.0000 mg | Freq: Once | INTRAVENOUS | Status: AC
  Administered 2021-11-17: 4 mg via INTRAVENOUS
  Filled 2021-11-17: qty 1

## 2021-11-17 MED ORDER — FLUCONAZOLE IN SODIUM CHLORIDE 400-0.9 MG/200ML-% IV SOLN
400.0000 mg | INTRAVENOUS | Status: DC
  Administered 2021-11-17 – 2021-11-18 (×2): 400 mg via INTRAVENOUS
  Filled 2021-11-17 (×4): qty 200

## 2021-11-17 MED ORDER — ORAL CARE MOUTH RINSE: 15.0000 mL | OROMUCOSAL | Status: DC | PRN

## 2021-11-17 MED ORDER — MORPHINE SULFATE (PF) 2 MG/ML IV SOLN
2.0000 mg | INTRAVENOUS | Status: DC | PRN
  Administered 2021-11-17 – 2021-11-19 (×5): 2 mg via INTRAVENOUS
  Filled 2021-11-17 (×5): qty 1

## 2021-11-17 MED ORDER — POTASSIUM CHLORIDE 10 MEQ/100ML IV SOLN
10.0000 meq | INTRAVENOUS | Status: AC
  Administered 2021-11-17 (×6): 10 meq via INTRAVENOUS
  Filled 2021-11-17 (×6): qty 100

## 2021-11-17 MED ORDER — OLANZAPINE 5 MG PO TABS
5.0000 mg | ORAL_TABLET | Freq: Every day | ORAL | Status: DC
Start: 2021-11-17 — End: 2021-11-19
  Filled 2021-11-17 (×2): qty 1

## 2021-11-17 MED ORDER — POTASSIUM CHLORIDE 10 MEQ/100ML IV SOLN
10.0000 meq | INTRAVENOUS | Status: AC
  Administered 2021-11-17 (×3): 10 meq via INTRAVENOUS
  Filled 2021-11-17 (×2): qty 100

## 2021-11-17 MED ORDER — MAGNESIUM SULFATE 2 GM/50ML IV SOLN
2.0000 g | Freq: Once | INTRAVENOUS | Status: AC
  Administered 2021-11-17: 2 g via INTRAVENOUS
  Filled 2021-11-17: qty 50

## 2021-11-17 MED ORDER — ACETAMINOPHEN 325 MG PO TABS: 650.0000 mg | ORAL_TABLET | Freq: Four times a day (QID) | ORAL | Status: DC | PRN

## 2021-11-17 MED ORDER — LORAZEPAM 0.5 MG PO TABS: 0.5000 mg | ORAL_TABLET | Freq: Three times a day (TID) | ORAL | Status: DC | PRN

## 2021-11-17 MED ORDER — ONDANSETRON HCL 4 MG/2ML IJ SOLN
4.0000 mg | INTRAMUSCULAR | Status: AC
  Administered 2021-11-17: 4 mg via INTRAVENOUS
  Filled 2021-11-17: qty 2

## 2021-11-17 NOTE — Assessment & Plan Note (Addendum)
Improved with electrolyte replacement. -Continue to monitor as patient is on Diflucan

## 2021-11-17 NOTE — Hospital Course (Signed)
Taken from H&P.  Jill Rodriguez is a 61 y.o. 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 Who presents to the ED via EMS with nausea, vomiting and generalized weakness that started since discharge.  During her recent hospitalization she had multiple electrolyte abnormalities and was diagnosed with moderate protein calorie malnutrition and esophageal dysmotility  CT chest abdomen and pelvis at that time showed increasing size of pulmonary metastases and a left pleural effusion which was negative for malignancy on thoracentesis.  There were multiple goals of care discussions with family but family chose to continue chemo.  She currently has a PICC line for IV Diflucan until 8/17. ED course and data review: Temperature 99.2 with pulse 95 103, respirations 21, BP 146/73 with O2 sat 95% on room air Labs: WBC 4900, hemoglobin 8.4 and platelets 308,000, lactic acid 1.3.  Sodium 134, potassium less than 2, chloride 87, creatinine 1.06 up from baseline of 0.86.  Albumin of 3.  Lipase and LFTs unremarkable, troponin 13 and magnesium 1.6 EKG, personally reviewed and interpreted: Sinus at 94 with nonspecific ST-T wave changes Chest x-ray: Moderate left pleural effusion with associated lingula and left lower lobe atelectasis versus CT abdomen and pelvis with contrast showing the following IMPRESSION: No acute findings or interval change from recent CT.   Stable mild para-aortic soft tissue, likely reflecting treated metastases. No new/discrete abdominopelvic lymphadenopathy.   Scattered right lung nodules measuring up to 8 mm, suspicious  for metastases, unchanged. Small left pleural effusion.   Cholelithiasis, without associated inflammatory changes.   Patient treated with IV potassium and IV magnesium and given an IV fluid bolus and IV morphine.  Hospitalist consulted for admission.  7/24: Continued to have significant hypokalemia with improvement in magnesium.  We will continue replacing potassium.  Continue to have significant nausea. Dietitian is recommending G-J-tube placement with IR if patient and family continue to have full scope of care.  Diflucan was restarted.  7/25: Patient continued to have some nausea and vomiting.  This seems chronic.  Denies any difficulty swallowing.  At time able to tolerate some diet.  She was willing to try boost and Ensure. She was asking for transfer to [facility], took a long time to explain that might not be possible and she should follow-up with her oncologist for further management. We also discussed about G-J-tube as that might not help with her nutritional status due to persistent nausea and vomiting and can make her high risk for aspiration if stomach is full.  She should rather try using small more frequent high caloric diet as she denied any difficulty swallowing.  An interpreter was used to discuss all this with patient and her husband.  Daughter was on phone.  Patient was avoiding to go to [facility] ED due to increased wait time. Hypokalemia resolved.  Patient can be transferred to [place] and the potential discharge for tomorrow.  Patient was advised again to follow-up with her providers at [facility] and keep the care at one place for better outcome.  7/26: Patient with significant improvement, no more nausea or vomiting.  Able to tolerate nutritional supplement and some diet. She is being discharged with recommendations to follow-up with her  oncologist for further management.

## 2021-11-17 NOTE — ED Triage Notes (Signed)
Patient to ED from home via EMS.  Patient with complaint of generalized weakness, nausea and vomiting since Wednesday.

## 2021-11-17 NOTE — Assessment & Plan Note (Addendum)
-  Continue IV Diflucan until 8/17

## 2021-11-17 NOTE — Assessment & Plan Note (Addendum)
Resolved. -Monitor renal function 

## 2021-11-17 NOTE — Progress Notes (Signed)
Progress Note   Patient: Jill Rodriguez BTD:[id number] DOB: 03/15/1961 DOA: 11/17/2021     0 DOS: the patient was seen and examined on 11/17/2021   Brief hospital course: Taken from H&P.  Liley Rake Carmell Austria is a 61 y.o. 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 Who presents to the ED via EMS with nausea, vomiting and generalized weakness that started since discharge.  During her recent hospitalization she had multiple electrolyte abnormalities and was diagnosed with moderate protein calorie malnutrition and esophageal dysmotility  CT chest abdomen and pelvis at that time showed increasing size of pulmonary metastases and a left pleural effusion which was negative for malignancy on thoracentesis.  There were multiple goals of care discussions with family but family chose to continue chemo.  She currently has a PICC line for IV Diflucan until 8/17. ED course and data review: Temperature 99.2 with pulse 95 103, respirations 21, BP 146/73 with O2 sat 95% on room air Labs: WBC 4900, hemoglobin 8.4 and platelets 308,000, lactic acid 1.3.  Sodium 134, potassium less than 2, chloride 87, creatinine 1.06 up from baseline of 0.86.  Albumin of 3.  Lipase and LFTs unremarkable, troponin 13 and magnesium 1.6 EKG, personally reviewed and interpreted: Sinus at 94 with nonspecific ST-T wave changes Chest x-ray: Moderate left pleural effusion with associated lingula and left lower lobe atelectasis versus CT abdomen and pelvis with contrast showing the following IMPRESSION: No acute findings or interval change from recent CT.   Stable mild  para-aortic soft tissue, likely reflecting treated metastases. No new/discrete abdominopelvic lymphadenopathy.   Scattered right lung nodules measuring up to 8 mm, suspicious for metastases, unchanged. Small left pleural effusion.   Cholelithiasis, without associated inflammatory changes.   Patient treated with IV potassium and IV magnesium and given an IV fluid bolus and IV morphine.  Hospitalist consulted for admission.  7/24: Continued to have significant hypokalemia with improvement in magnesium.  We will continue replacing potassium.  Continue to have significant nausea. Dietitian is recommending G-J-tube placement with IR if patient and family continue to have full scope of care.  Diflucan was restarted.   Assessment and Plan: * Chemotherapy induced nausea and vomiting, intractable IV antiemetics, IV fluids and electrolyte repletion  Hypokalemia Persistent hypokalemia with improvement in magnesium. Continue IV repletion with close monitoring   AKI (acute kidney injury) ([place]) Resolved. -Monitor renal function  Hypomagnesemia Improved.  Magnesium is now within normal limit. -Continue to monitor and replete as needed  Candidemia ([place]) -Continue IV Diflucan until 8/17  Prolonged QT interval Improved with electrolyte replacement. -Continue to monitor as patient is on Diflucan  Protein-calorie malnutrition, moderate ([place]) Related to several weeks of poor oral intake, dietitian was consulted and they are recommending G-J-tube placement with IR if family continue to have full scope of care.  We will discuss again tomorrow.  She continues to have significant nausea and vomiting. Will be high Rodriguez for refeeding.  Malignant neoplasm of cervix [facility]) Family wants full scope of treatment with several recent discussions based on recent past discharge summary     Subjective: Patient continued to experience nausea and vomiting.  Denies any pain.  Husband at bedside who  helped with  interpretation.  Patient is a Spanish-speaking lady.  Physical Exam: Vitals:   11/17/21 1300 11/17/21 1400 11/17/21 1500 11/17/21 1600  BP: (!) 148/83 (!) 156/86 (!) 156/87 (!) 158/85  Pulse: 91 90 97 96  Resp: '10 17 13 '$ (!) 8  Temp:    98.2 F (36.8 C)  TempSrc:    Oral  SpO2: 99% 98% 94% 97%  Weight:      Height:       General.  Ill-appearing lady, in no acute distress. Pulmonary.  Lungs clear bilaterally, normal respiratory effort. CV.  Regular rate and rhythm, no JVD, rub or murmur. Abdomen.  Soft, nontender, nondistended, BS positive. CNS.  Alert and oriented .  No focal neurologic deficit. Extremities.  Trace LE edema, no cyanosis, pulses intact and symmetrical. Psychiatry.  Judgment and insight appears normal.  Data Reviewed: Prior data reviewed  Family Communication: Discussed with husband at bedside  Disposition: Status is: Inpatient Remains inpatient appropriate because: Severity of illness   Planned Discharge Destination: To be determined  DVT prophylaxis.  Lovenox Time spent: 50 minutes  This record has been created using Systems analyst. Errors have been sought and corrected,but may not always be located. Such creation errors do not reflect on the standard of care.  Author: Lorella Nimrod, MD 11/17/2021 5:18 PM  For on call review [email or web address].

## 2021-11-17 NOTE — Assessment & Plan Note (Signed)
Family wants full scope of treatment with several recent discussions based on recent past discharge summary

## 2021-11-17 NOTE — Assessment & Plan Note (Signed)
IV antiemetics, IV fluids and electrolyte repletion

## 2021-11-17 NOTE — Assessment & Plan Note (Addendum)
Improved.  Magnesium is now within normal limit. -Continue to monitor and replete as needed

## 2021-11-17 NOTE — Assessment & Plan Note (Addendum)
Persistent hypokalemia with improvement in magnesium. Continue IV repletion with close monitoring

## 2021-11-17 NOTE — Progress Notes (Signed)
Initial Nutrition Assessment  DOCUMENTATION CODES:   Not applicable  INTERVENTION:   Recommend consideration of IR G-tube vs G-J tube placement if plan is for full scope of care and continued chemotherapy.   Pt is at high refeed risk.   Ensure Max protein supplement BID with diet advancement, each supplement provides 150kcal and 30g of protein.  Magic cup TID with diet advancement, each supplement provides 290 kcal and 9 grams of protein  MVI po daily with diet advancement.   NUTRITION DIAGNOSIS:   Unintentional weight loss related to dysphagia as evidenced by 14 percent weight loss in 3 months.  GOAL:   Patient will meet greater than or equal to 90% of their needs  MONITOR:   PO intake, Supplement acceptance, Labs, Weight trends, Skin, I & O's  REASON FOR ASSESSMENT:   Consult Assessment of nutrition requirement/status  ASSESSMENT:   61 y/o female with h/o stage IV cervical cancer with pulmonary metastasis on chemotherapy and recent admission for nausea/vomting, pancytopenia, candidemia with candida albicans, neutropenic fever, plueral effusion, vocal cord paralysis and esophageal dysmotility and who is now admitted with recurrent intractable nausea and vomitng and electrolyte abnormalties.  Met with pt in room today. Pt is well known to this RD from her recent previous admission. Pt with poor oral intake for several months pta r/t chemo induced nausea/vomiting along with new vocal cord paralysis and esophageal dysmotility. Pt has lost 26lbs(14%) over the past 3 months and has lost 2lbs(2%) since her last admission; this is significant weight loss. Pt reports that her oral intake has continued to be poor since discharge. Pt continues to have nausea/vomiting in hospital today. Pt was recommended for G-tube placement last admission as it was felt pt was having regurgitation secondary to her esophageal dysmotility. Pt initially declined feeding tube but after discussing with her  daughter was in agreement but wanted to see if her cancer had advanced before deciding. Plan was for pt to follow up with oncology. Pt remains NPO today. Would recommend consideration of feeding tube placement if plan is for full scope of care and continued chemotherapy. If patient if having emesis vs regurgitation, would recommend consideration of IR G-J tube placement. Feeding tube was discussed with MD today who will discuss with pt and family. Pt is at high refeed risk. RD will add supplements with diet advancement. Pt prefers supplements that are less sweet. Pt is at high risk for developing malnutrition.   Medications reviewed and include: lovenox, LRS _0 /hr, Kcl  Labs reviewed: K 2.5(L), Mg 2.3 wnl Hgb 7.7(L), Hct 24.1(L)  NUTRITION - FOCUSED PHYSICAL EXAM:  Flowsheet Row Most Recent Value  Orbital Region No depletion  Upper Arm Region No depletion  Thoracic and Lumbar Region No depletion  Buccal Region No depletion  Temple Region No depletion  Clavicle Bone Region No depletion  Clavicle and Acromion Bone Region No depletion  Scapular Bone Region No depletion  Dorsal Hand No depletion  Patellar Region No depletion  Anterior Thigh Region No depletion  Posterior Calf Region No depletion  Edema (RD Assessment) None  Hair Reviewed  Eyes Reviewed  Mouth Reviewed  Skin Reviewed  Nails Reviewed   Diet Order:   Diet Order             Diet NPO time specified Except for: Ice Chips  Diet effective now                  EDUCATION NEEDS:   Education needs have been  addressed  Skin:  Skin Assessment: Reviewed RN Assessment (ecchymosis)  Last BM:  pta  Height:   Ht Readings from Last 1 Encounters:  11/17/21 _0  (1.626 m)    Weight:   Wt Readings from Last 1 Encounters:  11/17/21 72.6 kg    Ideal Body Weight:  54.5 kg  BMI:  Body mass index is 27.46 kg/m.  Estimated Nutritional Needs:   Kcal:  1700-2000kcal/day  Protein:  85-100g/day  Fluid:   1.7-1.9L/day  Koleen Distance MS, RD, LDN Please refer to [facility] for RD and/or RD on-call/weekend/after hours pager

## 2021-11-17 NOTE — ED Provider Notes (Signed)
[facility] Provider Note    Event Date/Time   First MD Initiated Contact with Patient 11/17/21 0050     (approximate)   History   Weakness, Emesis, and Nausea   HPI  Jill Rodriguez is a 61 y.o. female who on review of discharge summary from earlier this month "61 y.o. 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 coli UTI sensitive only to ertapenem and nitrofurantoin, with last chemotherapy infusion on 6/23 who presents to the ED with intractable nausea and vomiting that started 2 days prior to hospital admission. She was treated symptomatically, found to have candidemia and currently on IV Diflucan.  She was also found to have vocal cord paralysis and esophageal dysmotility with air-fluid levels.  Underwent left-sided thoracentesis for large pleural effusion removal of 500 cc of pleural fluid.  Cytology is negative for malignancy.  Remained in the hospital, very debilitated and unable to tolerate oral medications."  1 patient has vocal cord dysfunction her husband provides majority of history and we utilized [place] interpreter throughout.  He advises for about 2 days now she has been experiencing nausea and vomiting fatigue and weakness.  No known fever.  She is receiving infusions through PICC line daily which on review of records appears to be Diflucan  She has had slight cough no shortness of breath.  She continues to have episodes of nausea vomiting up sort of yellow gastric contents no black or bloody.  Her last chemotherapy treatment was about a month ago via [facility]  She had a port removed about 2 weeks ago for concerns of infection     Physical Exam   Triage Vital  Signs: ED Triage Vitals  Enc Vitals Group     BP 11/17/21 0026 (!) 146/73     Pulse Rate 11/17/21 0026 95     Resp 11/17/21 0026 16     Temp 11/17/21 0026 99.2 F (37.3 C)     Temp Source 11/17/21 0026 Oral     SpO2 11/17/21 0026 95 %     Weight 11/17/21 0027 160 lb (72.6 kg)     Height 11/17/21 0027 '5\' 4"'$  (1.626 m)     Head Circumference --      Peak Flow --      Pain Score 11/17/21 0030 8     Pain Loc --      Pain Edu? --      Excl. in [place]? --     Most recent vital signs: Vitals:   11/17/21 0232 11/17/21 0559  BP: (!) 175/70 128/89  Pulse: (!) 51 92  Resp: (!) 21 12  Temp:  (!) 97.5 F (36.4 C)  SpO2: 100% 100%     General: Awake, no distress.  She appears somewhat pale very fatigued.  Quite chronically ill CV:  Good peripheral perfusion.  She has slow radial pulse was noted on telemetry to have apparent bigeminy noted during my examination with a pulse of approximately 40 Resp:  Normal effort.  Diminished in the bases bilaterally.  No wheezing.  Speaks without difficulty and no hypoxia.  No noted crackles. Abd:  No distention.  Obesity.  She reports tenderness across the upper abdomen and epigastrium bilaterally, more so over the epigastric  region.  No lower abdominal pain.  Denies any pain or burning with urination Other:  Very mild trace edema at the ankles bilaterally.   ED Results / Procedures / Treatments   Labs (all labs ordered are listed, but only abnormal results are displayed) Labs Reviewed  CBC WITH DIFFERENTIAL/PLATELET - Abnormal; Notable for the following components:      Result Value   RBC 2.70 (*)    Hemoglobin 8.4 (*)    HCT 25.6 (*)    RDW 18.8 (*)    nRBC 1.0 (*)    All other components within normal limits  COMPREHENSIVE METABOLIC PANEL - Abnormal; Notable for the following components:   Sodium 134 (*)    Potassium <2.0 (*)    Chloride 87 (*)    CO2 36 (*)    Glucose, Bld 124 (*)    Creatinine, Ser 1.06 (*)    Calcium 8.6 (*)    Total  Protein 6.4 (*)    Albumin 3.0 (*)    GFR, Estimated 60 (*)    All other components within normal limits  URINALYSIS, COMPLETE (UACMP) WITH MICROSCOPIC - Abnormal; Notable for the following components:   Color, Urine YELLOW (*)    APPearance HAZY (*)    Ketones, ur 5 (*)    Protein, ur 30 (*)    Leukocytes,Ua SMALL (*)    All other components within normal limits  MAGNESIUM - Abnormal; Notable for the following components:   Magnesium 1.6 (*)    All other components within normal limits  CBC - Abnormal; Notable for the following components:   RBC 2.46 (*)    Hemoglobin 7.7 (*)    HCT 24.1 (*)    RDW 18.8 (*)    nRBC 0.7 (*)    All other components within normal limits  BASIC METABOLIC PANEL - Abnormal; Notable for the following components:   Potassium 2.7 (*)    Chloride 90 (*)    CO2 35 (*)    Glucose, Bld 105 (*)    Calcium 8.2 (*)    All other components within normal limits  CULTURE, BLOOD (ROUTINE X 2)  CULTURE, BLOOD (ROUTINE X 2)  SARS CORONAVIRUS 2 BY RT PCR  URINE CULTURE  LIPASE, BLOOD  LACTIC ACID, PLASMA  PROCALCITONIN  BRAIN NATRIURETIC PEPTIDE  MAGNESIUM  POTASSIUM  MAGNESIUM  TROPONIN I (HIGH SENSITIVITY)     EKG  Interpreted by me at 0030 heart rate 95 QRS 90 QTc 4 of 30, notably prolonged QT interval, very mild nonspecific T wave abnormality.  Does not appear to be acutely ischemic but will send troponin, also no chest pain, significant concern for possible electrolyte abnormality or drug effect with a notably prolonged QT   RADIOLOGY Chest x-ray interpreted by me as notable moderate left-sided pleural effusion   CT ABDOMEN PELVIS W CONTRAST  Result Date: 11/17/2021 CLINICAL DATA:  Nausea weakness, nausea, vomiting. History of cervical cancer. EXAM: CT ABDOMEN AND PELVIS WITH CONTRAST TECHNIQUE: Multidetector CT imaging of the abdomen and pelvis was performed using the standard protocol following bolus administration of intravenous contrast.  RADIATION DOSE REDUCTION: This exam was performed according to the departmental dose-optimization program which includes automated exposure control, adjustment of the mA and/or kV according to patient size and/or use of iterative reconstruction technique. CONTRAST:  129m OMNIPAQUE IOHEXOL 300 MG/ML  SOLN COMPARISON:  11/05/2021 FINDINGS: Lower chest: Eventration of left hemidiaphragm with associated lingular and left lower lobe atelectasis and small left pleural effusion. Scattered  right lung nodules measuring up to 8 mm, suspicious for metastases, unchanged. Fluid in the distal esophagus. Hepatobiliary: Liver is within normal limits. Layering gallstones (series 2/image 29), without associated inflammatory changes. No intrahepatic respect dilatation. Pancreas: Within normal limits. Spleen: Within normal limits. Adrenals/Urinary Tract: Adrenal glands are within normal limits. Bilateral renal cysts, measuring up to 11 mm in the interpolar left kidney (series 7/image 15). No hydronephrosis. Bladder is underdistended but unremarkable. Stomach/Bowel: Stomach is within normal limits. No evidence of bowel obstruction. Normal appendix (series 2/image 67). No colonic wall thickening or inflammatory changes. Vascular/Lymphatic: No evidence of abdominal aortic aneurysm. Atherosclerotic calcifications of the abdominal aorta and branch vessels. Mild para-aortic soft tissue (series 2/images 40 and 47), likely reflecting treated metastases. No new/discrete abdominopelvic lymphadenopathy. Reproductive: Uterus is unremarkable.  No discrete cervical mass. Bilateral ovaries are within normal limits. Other: No abdominopelvic ascites. No frank peritoneal nodularity. Musculoskeletal: Suspected subcutaneous injection site in the left anterior abdominal wall (series 2/image 45). Mild degenerative changes the visualized thoracolumbar spine. Mild inferior endplate changes at L2, unchanged. Mild to moderate superior endplate compression  fracture deformity at L4, unchanged. 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 IMPRESSION: No acute findings or interval change from recent CT. Stable mild para-aortic soft tissue, likely reflecting treated metastases. No new/discrete abdominopelvic lymphadenopathy. Scattered right lung nodules measuring up to 8 mm, suspicious for metastases, unchanged. Small left pleural effusion. Cholelithiasis, without associated inflammatory changes. Electronically Signed   By: Julian Hy M.D.   On: 11/17/2021 02:32   DG Chest Port 1 View  Result Date: 11/17/2021 CLINICAL DATA:  Nausea/vomiting, weakness, cough.  On chemotherapy. EXAM: PORTABLE CHEST 1 VIEW COMPARISON:  CT chest dated 10/30/2021 FINDINGS: Moderate left pleural effusion. Associated lingular and left lower lobe atelectasis. Eventration of left hemidiaphragm. Right lung is clear.  No pneumothorax. The heart is top-normal in size. Left arm PICC terminates at the cavoatrial junction. IMPRESSION: Moderate left pleural effusion. Associated lingular and left lower lobe atelectasis. Electronically Signed   By: Julian Hy M.D.   On: 11/17/2021 01:31       PROCEDURES:  Critical Care performed: Yes, see critical care procedure note(s)  CRITICAL CARE Performed by: Delman Kitten   Total critical care time: 35 minutes  Critical care time was exclusive of separately billable procedures and treating other patients.  Critical care was necessary to treat or prevent imminent or life-threatening deterioration.  Critical care was time spent personally by me on the following activities: development of treatment plan with patient and/or surrogate as well as nursing, discussions with consultants, evaluation of patient's response to treatment, examination of patient, obtaining history from patient or surrogate, ordering and performing treatments and interventions, ordering and review of laboratory  studies, ordering and review of radiographic studies, pulse oximetry and re-evaluation of patient's condition.   Procedures   MEDICATIONS ORDERED IN ED: Medications  prochlorperazine (COMPAZINE) injection 10 mg (has no administration in time range)  enoxaparin (LOVENOX) injection 40 mg (has no administration in time range)  acetaminophen (TYLENOL) tablet 650 mg (has no administration in time range)    Or  acetaminophen (TYLENOL) suppository 650 mg (has no administration in time range)  morphine (PF) 2 MG/ML injection 2 mg (has no administration in time range)  sodium chloride 0.9 % bolus 500 mL (0 mLs Intravenous Stopped 11/17/21 0427)  ondansetron (ZOFRAN) injection 4 mg (4 mg Intravenous Given 11/17/21 0149)  morphine (PF) 4 MG/ML injection 4 mg (4 mg Intravenous  Given 11/17/21 0150)  potassium chloride 10 mEq in 100 mL IVPB (0 mEq Intravenous Stopped 11/17/21 0427)  magnesium sulfate IVPB 2 g 50 mL (0 g Intravenous Stopped 11/17/21 0316)  iohexol (OMNIPAQUE) 300 MG/ML solution 100 mL (100 mLs Intravenous Contrast Given 11/17/21 0158)  potassium chloride 10 mEq in 100 mL IVPB (0 mEq Intravenous Stopped 11/17/21 0658)     IMPRESSION / MDM / ASSESSMENT AND PLAN / ED COURSE  I reviewed the triage vital signs and the nursing notes.                              Differential diagnosis includes, but is not limited to, bowel obstruction, sepsis, gastroenteritis enteritis colitis, cholecystitis, pancreatitis, etc.  Very broad differential given the patient's complicated medical history.  Also of note EKG very concerning for possible underlying electrolyte abnormality being monitored very closely.  Hydrating gently as we await laboratory testing results  ----------------------------------------- 1:43 AM on 11/17/2021 ----------------------------------------- Critically low potassium which I suspect is certainly is the represent of cause of her EKG abnormalities and she is having occasional  episodes of bigeminy.  We will replete this as well as magnesium.  Continue to monitor very closely on telemetry.  Patient's presentation is most consistent with acute presentation with potential threat to life or bodily function.  The patient is on the cardiac monitor to evaluate for evidence of arrhythmia and/or significant heart rate changes.   Case clinical history discussed with consultant from internal medicine Dr. Damita Dunnings.  Upon discussion of case, patient will be admitted to the hospitalist service for ongoing care and treatment.     FINAL CLINICAL IMPRESSION(S) / ED DIAGNOSES   Final diagnoses:  Hypokalemia  Prolonged Q-T interval on ECG  Nausea and vomiting, unspecified vomiting type     Rx / DC Orders   ED Discharge Orders     None        Note:  This document was prepared using Dragon voice recognition software and may include unintentional dictation errors.   Delman Kitten, MD 11/17/21 [phone]

## 2021-11-17 NOTE — Assessment & Plan Note (Addendum)
Related to several weeks of poor oral intake, dietitian was consulted and they are recommending G-J-tube placement with IR if family continue to have full scope of care.  We will discuss again tomorrow.  She continues to have significant nausea and vomiting. Will be high risk for refeeding.

## 2021-11-17 NOTE — H&P (Signed)
History and Physical    Patient: Jill Rodriguez PZW:[id number] DOB: July 14, 1960 DOA: 11/17/2021 DOS: the patient was seen and examined on 11/17/2021 PCP: Pcp, No  Patient coming from: Home  Chief Complaint:  Chief Complaint  Patient presents with   Weakness   Emesis   Nausea    HPI: [name] is a 61 y.o. 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 Who presents to the ED via EMS with nausea, vomiting and generalized weakness that started since discharge.  During her recent hospitalization she had multiple electrolyte abnormalities and was diagnosed with moderate protein calorie malnutrition and esophageal dysmotility  CT chest abdomen and pelvis at that time showed increasing size of pulmonary metastases and a left pleural effusion which was negative for malignancy on thoracentesis.  There were multiple goals of care discussions with family but family chose to continue chemo.  She currently has a PICC line for IV Diflucan until 8/17. ED course and data review: Temperature 99.2 with pulse 95 103, respirations 21, BP 146/73 with O2 sat 95% on room air Labs: WBC 4900, hemoglobin 8.4 and platelets 308,000, lactic acid 1.3.  Sodium 134, potassium less than 2, chloride 87, creatinine 1.06 up from baseline of 0.86.  Albumin of 3.  Lipase and LFTs unremarkable, troponin 13 and magnesium 1.6 EKG, personally reviewed and interpreted: Sinus at 94 with nonspecific ST-T wave changes Chest x-ray: Moderate left pleural effusion with associated lingula and left lower lobe atelectasis versus CT abdomen and pelvis with contrast showing  the following IMPRESSION: No acute findings or interval change from recent CT.   Stable mild para-aortic soft tissue, likely reflecting treated metastases. No new/discrete abdominopelvic lymphadenopathy.   Scattered right lung nodules measuring up to 8 mm, suspicious for metastases, unchanged. Small left pleural effusion.   Cholelithiasis, without associated inflammatory changes.  Patient treated with IV potassium and IV magnesium and given an IV fluid bolus and IV morphine.  Hospitalist consulted for admission.     Past Medical History:  Diagnosis Date   Cancer ([place])    cervical CA, that spread to lymph nodes; last chemo was 12/08/2019; radiation 01/2020   COVID-19 10/2018   Nausea with vomiting 10/31/2019   Neoplasm related pain    No pertinent past medical history    Past Surgical History:  Procedure Laterality Date   ESOPHAGOGASTRODUODENOSCOPY (EGD) WITH PROPOFOL N/A 03/28/2020   Procedure: ESOPHAGOGASTRODUODENOSCOPY (EGD) WITH PROPOFOL;  Surgeon: Milus Banister, MD;  Location: [facility] ENDOSCOPY;  Service: Endoscopy;  Laterality: N/A;   EUS N/A 03/28/2020   Procedure: UPPER ENDOSCOPIC ULTRASOUND (EUS) RADIAL;  Surgeon: Milus Banister, MD;  Location: [facility];  Service: Endoscopy;  Laterality: N/A;   IR REMOVAL TUN ACCESS W/ PORT W/O FL MOD SED  10/30/2021   no surgical history     Social History:  reports that she has never smoked. She has never used smokeless tobacco. She reports that she does not drink alcohol and does not use drugs.  No Known Allergies  Family History  Problem Relation Age of Onset   Blindness Mother    Glaucoma Mother    Diabetes Father    Diabetes Brother  Prior to Admission medications   Medication Sig Start Date End Date Taking? Authorizing Provider  dexamethasone (DECADRON) 4 MG tablet Take 4 mg by mouth as directed. Take 1 tablet ('4mg'$ ) 6/17-6/18 After chemo, take 2 tablets ('8mg'$ ) on days 2-4 then 1 tablet (4 mg) on days 5-7 10/25/21    [provider]  dronabinol (MARINOL) 2.5 MG capsule Take 2.5 mg by mouth daily. 09/30/21   [provider]  fluconazole (DIFLUCAN) IVPB Inject 400 mg into the vein daily. 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 11/08/21 12/11/21  Barb Merino, MD  lip balm (CARMEX) ointment Apply topically as needed for lip care. 11/10/21   Vanna Scotland, MD  LORazepam (ATIVAN) 0.5 MG tablet Take 0.5 mg by mouth every 8 (eight) hours as needed. 10/27/21   [provider]  Multiple Vitamins-Minerals (THERA-M) TABS Take 1 tablet by mouth daily. 10/10/21   [provider]  OLANZapine (ZYPREXA) 5 MG tablet Take 5 mg by mouth at bedtime. 10/25/21   [provider]  ondansetron (ZOFRAN) 4 MG tablet Take 1 tablet (4 mg total) by mouth every 8 (eight) hours as needed for nausea. 11/10/21 12/10/21  Vanna Scotland, MD  ondansetron (ZOFRAN) 8 MG tablet Take 8 mg by mouth 3 (three) times daily. 10/17/21   [provider]  prochlorperazine (COMPAZINE) 10 MG tablet Take 1 tablet (10 mg total) by mouth every 8 (eight) hours as needed (Nausea or vomiting). 11/10/21 12/10/21  Vanna Scotland, MD  promethazine (PHENERGAN) 25 MG suppository Place 1 suppository (25 mg total) rectally every 8 (eight) hours as needed for nausea, vomiting or refractory nausea / vomiting. 07/25/21   Earlie Server, MD  scopolamine (TRANSDERM-SCOP) 1 MG/3DAYS Place 1 patch (1.5 mg total) onto the skin every 3 (three) days. 11/10/21 12/10/21  Vanna Scotland, MD  traZODone (DESYREL) 50 MG tablet Take 1 tablet (50 mg total) by mouth at bedtime as  needed for sleep. 07/25/21   Earlie Server, MD    Physical Exam: Vitals:   11/17/21 0026 11/17/21 0027 11/17/21 0232  BP: (!) 146/73  (!) 175/70  Pulse: 95  (!) 51  Resp: 16  (!) 21  Temp: 99.2 F (37.3 C)    TempSrc: Oral    SpO2: 95%  100%  Weight:  72.6 kg   Height:  '5\' 4"'$  (1.626 m)    Physical Exam Vitals and nursing note reviewed.  Constitutional:      General: She is not in acute distress.    Appearance: She is ill-appearing.  HENT:     Head: Normocephalic and atraumatic.  Cardiovascular:     Rate and Rhythm: Normal rate and regular rhythm.     Heart sounds: Normal heart sounds.  Pulmonary:     Effort: Pulmonary effort is normal.     Breath sounds: Normal breath sounds.  Abdominal:     Palpations: Abdomen is soft.     Tenderness: There is no abdominal tenderness.  Neurological:     Mental Status: Mental status is at baseline.     Labs on Admission: I have personally reviewed following labs and imaging studies  CBC: Recent Labs  Lab 11/17/21 0044  WBC 4.9  NEUTROABS 2.6  HGB 8.4*  HCT 25.6*  MCV 94.8  PLT 818   Basic Metabolic Panel: Recent Labs  Lab 11/17/21 0044  NA 134*  K <2.0*  CL 87*  CO2 36*  GLUCOSE 124*  BUN 12  CREATININE 1.06*  CALCIUM 8.6*  MG 1.6*   GFR: Estimated Creatinine Clearance: 54.5 mL/min (A) (by C-G formula based on SCr of 1.06 mg/dL (H)). Liver Function Tests: Recent Labs  Lab 11/17/21 0044  AST 36  ALT 20  ALKPHOS 75  BILITOT 0.5  PROT 6.4*  ALBUMIN 3.0*   Recent Labs  Lab 11/17/21 0044  LIPASE 24   No results for input(s): "AMMONIA" in the last 168 hours. Coagulation Profile: No results for input(s): "INR", "PROTIME" in the last 168 hours. Cardiac Enzymes: No results for input(s): "CKTOTAL", "CKMB", "CKMBINDEX", "TROPONINI" in the last 168 hours. BNP (last 3 results) No results for input(s): "PROBNP" in the last 8760 hours. HbA1C: No results for input(s): "HGBA1C" in the last 72 hours. CBG: No results  for input(s): "GLUCAP" in the last 168 hours. Lipid Profile: No results for input(s): "CHOL", "HDL", "LDLCALC", "TRIG", "CHOLHDL", "LDLDIRECT" in the last 72 hours. Thyroid Function Tests: No results for input(s): "TSH", "T4TOTAL", "FREET4", "T3FREE", "THYROIDAB" in the last 72 hours. Anemia Panel: No results for input(s): "VITAMINB12", "FOLATE", "FERRITIN", "TIBC", "IRON", "RETICCTPCT" in the last 72 hours. Urine analysis:    Component Value Date/Time   COLORURINE YELLOW (A) 11/17/2021 0144   APPEARANCEUR HAZY (A) 11/17/2021 0144   LABSPEC 1.009 11/17/2021 0144   PHURINE 8.0 11/17/2021 0144   GLUCOSEU NEGATIVE 11/17/2021 0144   HGBUR NEGATIVE 11/17/2021 0144   BILIRUBINUR NEGATIVE 11/17/2021 0144   KETONESUR 5 (A) 11/17/2021 0144   PROTEINUR 30 (A) 11/17/2021 0144   NITRITE NEGATIVE 11/17/2021 0144   LEUKOCYTESUR SMALL (A) 11/17/2021 0144    Radiological Exams on Admission: CT ABDOMEN PELVIS W CONTRAST  Result Date: 11/17/2021 CLINICAL DATA:  Nausea weakness, nausea, vomiting. History of cervical cancer. EXAM: CT ABDOMEN AND PELVIS WITH CONTRAST TECHNIQUE: Multidetector CT imaging of the abdomen and pelvis was performed using the standard protocol following bolus administration of intravenous contrast. RADIATION DOSE REDUCTION: This exam was performed according to the departmental dose-optimization program which includes automated exposure control, adjustment of the mA and/or kV according to patient size and/or use of iterative reconstruction technique. CONTRAST:  19m OMNIPAQUE IOHEXOL 300 MG/ML  SOLN COMPARISON:  11/05/2021 FINDINGS: Lower chest: Eventration of left hemidiaphragm with associated lingular and left lower lobe atelectasis and small left pleural effusion. Scattered right lung nodules measuring up to 8 mm, suspicious for metastases, unchanged. Fluid in the distal esophagus. Hepatobiliary: Liver is within normal limits. Layering gallstones (series 2/image 29), without  associated inflammatory changes. No intrahepatic respect dilatation. Pancreas: Within normal limits. Spleen: Within normal limits. Adrenals/Urinary Tract: Adrenal glands are within normal limits. Bilateral renal cysts, measuring up to 11 mm in the interpolar left kidney (series 7/image 15). No hydronephrosis. Bladder is underdistended but unremarkable. Stomach/Bowel: Stomach is within normal limits. No evidence of bowel obstruction. Normal appendix (series 2/image 67). No colonic wall thickening or inflammatory changes. Vascular/Lymphatic: No evidence of abdominal aortic aneurysm. Atherosclerotic calcifications of the abdominal aorta and branch vessels. Mild para-aortic soft tissue (series 2/images 40 and 47), likely reflecting treated metastases. No new/discrete abdominopelvic lymphadenopathy. Reproductive: Uterus is unremarkable.  No discrete cervical mass. Bilateral ovaries are within normal limits. Other: No abdominopelvic ascites.  No frank peritoneal nodularity. Musculoskeletal: Suspected subcutaneous injection site in the left anterior abdominal wall (series 2/image 45). Mild degenerative changes the visualized thoracolumbar spine. Mild inferior endplate changes at L2, unchanged. Mild to moderate superior endplate compression fracture deformity at L4, unchanged. 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 IMPRESSION: No acute findings or interval change from recent CT. Stable mild para-aortic soft tissue, likely reflecting treated metastases. No new/discrete abdominopelvic lymphadenopathy. Scattered right lung nodules measuring up to 8 mm, suspicious for metastases, unchanged. Small left pleural effusion. Cholelithiasis, without associated inflammatory changes. Electronically Signed   By: Julian Hy M.D.   On: 11/17/2021 02:32   DG Chest Port 1 View  Result Date: 11/17/2021 CLINICAL DATA:  Nausea/vomiting, weakness, cough.  On chemotherapy. EXAM:  PORTABLE CHEST 1 VIEW COMPARISON:  CT chest dated 10/30/2021 FINDINGS: Moderate left pleural effusion. Associated lingular and left lower lobe atelectasis. Eventration of left hemidiaphragm. Right lung is clear.  No pneumothorax. The heart is top-normal in size. Left arm PICC terminates at the cavoatrial junction. IMPRESSION: Moderate left pleural effusion. Associated lingular and left lower lobe atelectasis. Electronically Signed   By: Julian Hy M.D.   On: 11/17/2021 01:31     Data Reviewed: Relevant notes from primary care and specialist visits, past discharge summaries as available in EHR, including Care Everywhere. Prior diagnostic testing as pertinent to current admission diagnoses Updated medications and problem lists for reconciliation ED course, including vitals, labs, imaging, treatment and response to treatment Triage notes, nursing and pharmacy notes and ED provider's notes Notable results as noted in HPI   Assessment and Plan: * Chemotherapy induced nausea and vomiting, intractable IV antiemetics, IV fluids and electrolyte repletion  Hypokalemia Severe hypokalemia less than 2 Continue IV repletion with close monitoring in stepdown  AKI (acute kidney injury) ([place]) Creatinine 1.06 up from baseline of 0.86 Secondary to intractable vomiting IV hydration, monitor renal function and avoid nephrotoxins  Hypomagnesemia Got 2 g magnesium in the ED.  Continue to monitor and replete as needed   Candidemia ([place]) Continue IV Diflucan until 8/17  Prolonged QT interval QT at 530.  Monitor closely in stepdown given need for multiple antiemetics and severe hypokalemia  Protein-calorie malnutrition, moderate ([facility]) Related to several weeks of poor oral intake  Malignant neoplasm of cervix ([place]) Family wants full scope of treatment with several recent discussions based on recent past discharge summary     DVT prophylaxis: Lovenox  Consults: none  Advance Care Planning:    Code Status: Prior   Family Communication: none  Disposition Plan: Back to previous home environment  Severity of Illness: The appropriate patient status for this patient is INPATIENT. Inpatient status is judged to be reasonable and necessary in order to provide the required intensity of service to ensure the patient's safety. The patient's presenting symptoms, physical exam findings, and initial radiographic and laboratory data in the context of their chronic comorbidities is felt to place them at high risk for further clinical deterioration. Furthermore, it is not anticipated that the patient will be medically stable for discharge from the hospital within 2 midnights of admission.   * I certify that at the point of admission it is my clinical judgment that the patient will require inpatient hospital care spanning beyond 2 midnights from the point of admission due to high intensity of service, high risk for further deterioration and high frequency of surveillance required.*  Author: Athena Masse, MD 11/17/2021 4:01 AM  For  on call review [email or web address].

## 2021-11-18 DIAGNOSIS — R112 Nausea with vomiting, unspecified: Secondary | ICD-10-CM | POA: Diagnosis not present

## 2021-11-18 DIAGNOSIS — T451X5A Adverse effect of antineoplastic and immunosuppressive drugs, initial encounter: Secondary | ICD-10-CM | POA: Diagnosis not present

## 2021-11-18 LAB — BASIC METABOLIC PANEL
Anion gap: 7 (ref 5–15)
BUN: 8 mg/dL (ref 8–23)
CO2: 33 mmol/L — ABNORMAL HIGH (ref 22–32)
Calcium: 8.2 mg/dL — ABNORMAL LOW (ref 8.9–10.3)
Chloride: 96 mmol/L — ABNORMAL LOW (ref 98–111)
Creatinine, Ser: 0.98 mg/dL (ref 0.44–1.00)
GFR, Estimated: >60 mL/min (ref >60)
Glucose, Bld: 90 mg/dL (ref 70–99)
Potassium: 3.5 mmol/L (ref 3.5–5.1)
Sodium: 136 mmol/L (ref 135–145)

## 2021-11-18 LAB — PROCALCITONIN: Procalcitonin: <0.1 ng/mL

## 2021-11-18 LAB — URINE CULTURE

## 2021-11-18 LAB — PHOSPHORUS: Phosphorus: 2.7 mg/dL (ref 2.5–4.6)

## 2021-11-18 MED ORDER — ENSURE ENLIVE PO LIQD
237.0000 mL | Freq: Two times a day (BID) | ORAL | Status: DC
  Administered 2021-11-18 – 2021-11-19 (×2): 237 mL via ORAL

## 2021-11-18 NOTE — Assessment & Plan Note (Signed)
Resolved with repletion. -Continue to monitor and replete as needed

## 2021-11-18 NOTE — Assessment & Plan Note (Signed)
Related to several weeks of poor oral intake, dietitian was consulted and they are recommending G-J-tube placement with IR if family continue to have full scope of care. She continues to have nausea and vomiting, denies any difficulty swallowing. Placing G-J-tube might not be very beneficial and can increase the risk of aspiration. -She was advised to discuss with her oncologist

## 2021-11-18 NOTE — Assessment & Plan Note (Signed)
Resolved. -Monitor renal function 

## 2021-11-18 NOTE — Assessment & Plan Note (Signed)
-  Continue IV Diflucan until 8/17

## 2021-11-18 NOTE — Assessment & Plan Note (Signed)
IV antiemetics, IV fluids and electrolyte repletion

## 2021-11-18 NOTE — Progress Notes (Signed)
Progress Note   Patient: Jill Rodriguez [id number] DOB: [birth year] DOA: 11/17/2021     1 DOS: the patient was seen and examined on 11/18/2021   Brief hospital course: Taken from H&P.  Jill Rodriguez is a 61 y.o. 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 Who presents to the ED via EMS with nausea, vomiting and generalized weakness that started since discharge.  During her recent hospitalization she had multiple electrolyte abnormalities and was diagnosed with moderate protein calorie malnutrition and esophageal dysmotility  CT chest abdomen and pelvis at that time showed increasing size of pulmonary metastases and a left pleural effusion which was negative for malignancy on thoracentesis.  There were multiple goals of care discussions with family but family chose to continue chemo.  She currently has a PICC line for IV Diflucan until 8/17. ED course and data review: Temperature 99.2 with pulse 95 103, respirations 21, BP 146/73 with O2 sat 95% on room air Labs: WBC 4900, hemoglobin 8.4 and platelets 308,000, lactic acid 1.3.  Sodium 134, potassium less than 2, chloride 87, creatinine 1.06 up from baseline of 0.86.  Albumin of 3.  Lipase and LFTs unremarkable, troponin 13 and magnesium 1.6 EKG, personally reviewed and interpreted: Sinus at 94 with nonspecific ST-T wave changes Chest x-ray: Moderate left pleural effusion with associated lingula and left lower lobe atelectasis versus CT abdomen and pelvis with contrast showing the following IMPRESSION: No acute findings or interval change from recent CT.   Stable mild  para-aortic soft tissue, likely reflecting treated metastases. No new/discrete abdominopelvic lymphadenopathy.   Scattered right lung nodules measuring up to 8 mm, suspicious for metastases, unchanged. Small left pleural effusion.   Cholelithiasis, without associated inflammatory changes.   Patient treated with IV potassium and IV magnesium and given an IV fluid bolus and IV morphine.  Hospitalist consulted for admission.  7/24: Continued to have significant hypokalemia with improvement in magnesium.  We will continue replacing potassium.  Continue to have significant nausea. Dietitian is recommending G-J-tube placement with IR if patient and family continue to have full scope of care.  Diflucan was restarted.  7/25: Patient continued to have some nausea and vomiting.  This seems chronic.  Denies any difficulty swallowing.  At time able to tolerate some diet.  She was willing to try boost and Ensure. She was asking for transfer to [facility], took a long time to explain that might not be possible and she should follow-up with her oncologist for further management. We also discussed about G-J-tube as that might not help with her nutritional status due to persistent nausea and vomiting and can make her high risk for aspiration if stomach is full.  She should rather try using small more frequent high caloric diet as she denied any difficulty swallowing.  An interpreter was used to discuss all this with patient and her husband.  Daughter was on phone.  Patient was avoiding to go to [facility] ED due to increased wait time. Hypokalemia resolved.  Patient can be transferred to [place] and the potential discharge for tomorrow.  Patient was advised again to follow-up with her providers at [facility] and keep the care at 1 place for  better outcome.    Assessment and Plan: * Chemotherapy induced nausea and vomiting, intractable IV antiemetics, IV fluids and electrolyte repletion  Hypokalemia Resolved with  repletion. -Continue to monitor and replete as needed  AKI (acute kidney injury) ([place]) Resolved. -Monitor renal function  Hypomagnesemia Improved.  Magnesium is now within normal limit. -Continue to monitor and replete as needed  Candidemia ([place]) -Continue IV Diflucan until 8/17  Prolonged QT interval Improved with electrolyte replacement. -Continue to monitor as patient is on Diflucan  Protein-calorie malnutrition, moderate ([place]) Related to several weeks of poor oral intake, dietitian was consulted and they are recommending G-J-tube placement with IR if family continue to have full scope of care. She continues to have nausea and vomiting, denies any difficulty swallowing. Placing G-J-tube might not be very beneficial and can increase the risk of aspiration. -She was advised to discuss with her oncologist  Malignant neoplasm of cervix [facility]) Family wants full scope of treatment with several recent discussions based on recent past discharge summary     Subjective: Patient continued to have some nausea and vomiting.  Intermittently improving and she was able to tolerate some p.o. intake.  She was willing to try small amounts of boost and Ensure and see if she can tolerate.  No difficulty swallowing.  Husband at bedside.  An Spanish-speaking interpreter was used for communication  Physical Exam: Vitals:   11/18/21 1000 11/18/21 1100 11/18/21 1200 11/18/21 1300  BP: (!) 150/79 118/72 (!) 141/81 132/73  Pulse: 100 100 (!) 103 96  Resp: '17 13 16 13  '$ Temp:   98.3 F (36.8 C)   TempSrc:   Oral   SpO2: 99% 95% 97% 98%  Weight:      Height:       General.  Chronically ill-appearing lady, in no acute distress. Pulmonary.  Lungs clear bilaterally, normal respiratory effort. CV.  Regular rate and rhythm, no JVD, rub or murmur. Abdomen.  Soft, nontender, nondistended, BS positive. CNS.  Alert and oriented .  No focal neurologic deficit. Extremities.  No edema, no cyanosis, pulses  intact and symmetrical. Psychiatry.  Judgment and insight appears normal.  Data Reviewed: Prior data reviewed  Family Communication: Discussed with husband at bedside and daughter on phone  Disposition: Status is: Inpatient Remains inpatient appropriate because: Severity of illness   Planned Discharge Destination: Home  DVT prophylaxis.  Lovenox Time spent: 50 minutes  This record has been created using Systems analyst. Errors have been sought and corrected,but may not always be located. Such creation errors do not reflect on the standard of care.  Author: Lorella Nimrod, MD 11/18/2021 4:03 PM  For on call review [email or web address].

## 2021-11-18 NOTE — Assessment & Plan Note (Signed)
Improved.  Magnesium is now within normal limit. -Continue to monitor and replete as needed

## 2021-11-19 DIAGNOSIS — T451X5A Adverse effect of antineoplastic and immunosuppressive drugs, initial encounter: Secondary | ICD-10-CM | POA: Diagnosis not present

## 2021-11-19 DIAGNOSIS — R112 Nausea with vomiting, unspecified: Secondary | ICD-10-CM | POA: Diagnosis not present

## 2021-11-19 LAB — BASIC METABOLIC PANEL
Anion gap: 9 (ref 5–15)
BUN: 10 mg/dL (ref 8–23)
CO2: 31 mmol/L (ref 22–32)
Calcium: 8.5 mg/dL — ABNORMAL LOW (ref 8.9–10.3)
Chloride: 97 mmol/L — ABNORMAL LOW (ref 98–111)
Creatinine, Ser: 0.95 mg/dL (ref 0.44–1.00)
GFR, Estimated: >60 mL/min (ref >60)
Glucose, Bld: 113 mg/dL — ABNORMAL HIGH (ref 70–99)
Potassium: 3.5 mmol/L (ref 3.5–5.1)
Sodium: 137 mmol/L (ref 135–145)

## 2021-11-19 LAB — PROCALCITONIN: Procalcitonin: <0.1 ng/mL

## 2021-11-19 MED ORDER — ENSURE ENLIVE PO LIQD: 237.0000 mL | Freq: Two times a day (BID) | ORAL | 12 refills | Status: DC

## 2021-11-19 MED ORDER — HYDROCODONE-ACETAMINOPHEN 5-325 MG PO TABS: 1.0000 | ORAL_TABLET | Freq: Four times a day (QID) | ORAL | 0 refills | Status: DC | PRN

## 2021-11-19 NOTE — Progress Notes (Signed)
Discharge instructions reviewed with patient and spouse via in person interpreter

## 2021-11-19 NOTE — Discharge Summary (Signed)
Physician Discharge Summary   Patient: Jill Rodriguez MRN: [id number] DOB: 1960-10-26  Admit date:     11/17/2021  Discharge date: 11/19/21  Discharge Physician: Lorella Nimrod   PCP: Pcp, No   Recommendations at discharge:  Please obtain CBC and BMP next few days. Follow-up with primary oncologist within a week  Discharge Diagnoses: Principal Problem:   Chemotherapy induced nausea and vomiting, intractable Active Problems:   AKI (acute kidney injury) ([place])   Hypokalemia   Hypomagnesemia   Candidemia (HCC)   Prolonged QT interval   Malignant neoplasm of cervix (HCC)   Protein-calorie malnutrition, moderate ([place])   Hospital Course: Taken from H&P.  Jill Rodriguez is a 61 y.o. 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 Who presents to the ED via EMS with nausea, vomiting and generalized weakness that started since discharge.  During her recent hospitalization she had multiple electrolyte abnormalities and was diagnosed with moderate protein calorie malnutrition and esophageal dysmotility  CT chest abdomen and pelvis at that time showed increasing size of pulmonary metastases and a left pleural effusion which was negative for malignancy on thoracentesis.  There were multiple goals of care discussions with family but family chose to continue chemo.  She currently has a PICC line for IV Diflucan until 8/17. ED course and data review: Temperature 99.2 with pulse 95 103, respirations 21, BP 146/73 with O2 sat 95% on room air Labs: WBC 4900, hemoglobin 8.4 and platelets 308,000, lactic acid 1.3.  Sodium 134, potassium less  than 2, chloride 87, creatinine 1.06 up from baseline of 0.86.  Albumin of 3.  Lipase and LFTs unremarkable, troponin 13 and magnesium 1.6 EKG, personally reviewed and interpreted: Sinus at 94 with nonspecific ST-T wave changes Chest x-ray: Moderate left pleural effusion with associated lingula and left lower lobe atelectasis versus CT abdomen and pelvis with contrast showing the following IMPRESSION: No acute findings or interval change from recent CT.   Stable mild para-aortic soft tissue, likely reflecting treated metastases. No new/discrete abdominopelvic lymphadenopathy.   Scattered right lung nodules measuring up to 8 mm, suspicious for metastases, unchanged. Small left pleural effusion.   Cholelithiasis, without associated inflammatory changes.   Patient treated with IV potassium and IV magnesium and given an IV fluid bolus and IV morphine.  Hospitalist consulted for admission.  7/24: Continued to have significant hypokalemia with improvement in magnesium.  We will continue replacing potassium.  Continue to have significant nausea. Dietitian is recommending G-J-tube placement with IR if patient and family continue to have full scope of care.  Diflucan was restarted.  7/25: Patient continued to have some nausea and vomiting.  This seems chronic.  Denies any difficulty swallowing.  At time able to tolerate some diet.  She was willing to try boost and Ensure. She was asking for transfer to [facility], took a long time to explain that might not be possible and she should follow-up with her oncologist for further management. We also discussed about G-J-tube as that might not help with her nutritional status due to persistent nausea and vomiting and can make her high risk for aspiration if stomach is full.  She should rather try using small more  frequent high caloric diet as she denied any difficulty swallowing.  An interpreter was used to discuss all this with patient and her husband.  Daughter was  on phone.  Patient was avoiding to go to [facility] ED due to increased wait time. Hypokalemia resolved.  Patient can be transferred to [place] and the potential discharge for tomorrow.  Patient was advised again to follow-up with her providers at [facility] and keep the care at one place for better outcome.  7/26: Patient with significant improvement, no more nausea or vomiting.  Able to tolerate nutritional supplement and some diet. She is being discharged with recommendations to follow-up with her oncologist for further management.  Patient will resume home health care at [facility] health, resume IV infusions per pharmacy orders.   Assessment and Plan: * Chemotherapy induced nausea and vomiting, intractable IV antiemetics, IV fluids and electrolyte repletion  Hypokalemia Resolved with repletion. -Continue to monitor and replete as needed  AKI (acute kidney injury) ([place]) Resolved. -Monitor renal function  Hypomagnesemia Improved.  Magnesium is now within normal limit. -Continue to monitor and replete as needed  Candidemia ([place]) -Continue IV Diflucan until 8/17  Prolonged QT interval Improved with electrolyte replacement. -Continue to monitor as patient is on Diflucan  Protein-calorie malnutrition, moderate ([place]) Related to several weeks of poor oral intake, dietitian was consulted and they are recommending G-J-tube placement with IR if family continue to have full scope of care. She continues to have nausea and vomiting, denies any difficulty swallowing. Placing G-J-tube might not be very beneficial and can increase the risk of aspiration. -She was advised to discuss with her oncologist  Malignant neoplasm of cervix [facility]) Family wants full scope of treatment with several recent discussions based on recent past discharge summary        Pain control - [place] was reviewed. and patient was instructed, not to drive, operate heavy  machinery, perform activities at heights, swimming or participation in water activities or provide baby-sitting services while on Pain, Sleep and Anxiety Medications; until their outpatient Physician has advised to do so again. Also recommended to not to take more than prescribed Pain, Sleep and Anxiety Medications.  Consultants: None Procedures performed: None Disposition: Home Diet recommendation:  Discharge Diet Orders (From admission, onward)     Start     Ordered   11/19/21 0000  Diet - low sodium heart healthy        11/19/21 1241           Regular diet DISCHARGE MEDICATION: Allergies as of 11/19/2021   No Known Allergies      Medication List     STOP taking these medications    promethazine 25 MG suppository Commonly known as: PHENERGAN       TAKE these medications    dexamethasone 4 MG tablet Commonly known as: DECADRON Take 4 mg by mouth as directed. Take 1 tablet ('4mg'$ ) 6/17-6/18 After chemo, take 2 tablets ('8mg'$ ) on days 2-4 then 1 tablet (4 mg) on days 5-7 Notes to patient: Not given in hospital   dronabinol 2.5 MG capsule Commonly known as: MARINOL Take 2.5 mg by mouth daily. Notes to patient: Not given in hospital   feeding supplement Liqd Take 237 mLs by mouth 2 (two) times daily between meals.   fluconazole  IVPB Commonly known as: DIFLUCAN Inject 400 mg into the vein daily. 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   HYDROcodone-acetaminophen 5-325 MG tablet Commonly known as: Norco Take 1 tablet by mouth every 6 (six) hours as needed for moderate  pain.   lip balm ointment Apply topically as needed for lip care.   LORazepam 0.5 MG tablet Commonly known as: ATIVAN Take 0.5 mg by mouth every 8 (eight) hours as needed. Notes to patient: Not given in hospital   OLANZapine 5 MG tablet Commonly known as: ZYPREXA Take 5 mg by mouth at bedtime. Notes to patient: Not given in hospital   ondansetron 8 MG tablet Commonly known as: ZOFRAN Take 8 mg by mouth 3 (three) times daily. What changed: Another medication with the same name was removed. Continue taking this medication, and follow the directions you see here.   prochlorperazine 10 MG tablet Commonly known as: COMPAZINE Take 1 tablet (10 mg total) by mouth every 8 (eight) hours as needed (Nausea or vomiting).   scopolamine 1 MG/3DAYS Commonly known as: TRANSDERM-SCOP Place 1 patch (1.5 mg total) onto the skin every 3 (three) days.   Thera-M Tabs Take 1 tablet by mouth daily.   traZODone 50 MG tablet Commonly known as: DESYREL Take 1 tablet (50 mg total) by mouth at bedtime as needed for sleep.        Discharge Exam: Filed Weights   11/17/21 0027 11/17/21 0830  Weight: 72.6 kg 73.2 kg   General.     In no acute distress. Pulmonary.  Lungs clear bilaterally, normal respiratory effort. CV.  Regular rate and rhythm, no JVD, rub or murmur. Abdomen.  Soft, nontender, nondistended, BS positive. CNS.  Alert and oriented .  No focal neurologic deficit. Extremities.  No edema, no cyanosis, pulses intact and symmetrical. Psychiatry.  Judgment and insight appears normal.   Condition at discharge: stable  The results of significant diagnostics from this hospitalization (including imaging, microbiology, ancillary and laboratory) are listed below for reference.   Imaging Studies: CT ABDOMEN PELVIS W CONTRAST  Result Date: 11/17/2021 CLINICAL DATA:  Nausea weakness, nausea, vomiting. History of cervical cancer. EXAM: CT ABDOMEN AND PELVIS WITH CONTRAST TECHNIQUE:  Multidetector CT imaging of the abdomen and pelvis was performed using the standard protocol following bolus administration of intravenous contrast. RADIATION DOSE REDUCTION: This exam was performed according to the departmental dose-optimization program which includes automated exposure control, adjustment of the mA and/or kV according to patient size and/or use of iterative reconstruction technique. CONTRAST:  153m OMNIPAQUE IOHEXOL 300 MG/ML  SOLN COMPARISON:  11/05/2021 FINDINGS: Lower chest: Eventration of left hemidiaphragm with associated lingular and left lower lobe atelectasis and small left pleural effusion. Scattered right lung nodules measuring up to 8 mm, suspicious for metastases, unchanged. Fluid in the distal esophagus. Hepatobiliary: Liver is within normal limits. Layering gallstones (series 2/image 29), without associated inflammatory changes. No intrahepatic respect dilatation. Pancreas: Within normal limits. Spleen: Within normal limits. Adrenals/Urinary Tract: Adrenal glands are within normal limits. Bilateral renal cysts, measuring up to 11 mm in the interpolar left kidney (series 7/image 15). No hydronephrosis. Bladder is underdistended but unremarkable. Stomach/Bowel: Stomach is within normal limits. No evidence of bowel obstruction. Normal appendix (series 2/image 67). No colonic wall thickening or inflammatory changes. Vascular/Lymphatic: No  evidence of abdominal aortic aneurysm. Atherosclerotic calcifications of the abdominal aorta and branch vessels. Mild para-aortic soft tissue (series 2/images 40 and 47), likely reflecting treated metastases. No new/discrete abdominopelvic lymphadenopathy. Reproductive: Uterus is unremarkable.  No discrete cervical mass. Bilateral ovaries are within normal limits. Other: No abdominopelvic ascites. No frank peritoneal nodularity. Musculoskeletal: Suspected subcutaneous injection site in the left anterior abdominal wall (series 2/image 45). Mild  degenerative changes the visualized thoracolumbar spine. Mild inferior endplate changes at L2, unchanged. Mild to moderate superior endplate compression fracture deformity at L4, unchanged. 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 IMPRESSION: No acute findings or interval change from recent CT. Stable mild para-aortic soft tissue, likely reflecting treated metastases. No new/discrete abdominopelvic lymphadenopathy. Scattered right lung nodules measuring up to 8 mm, suspicious for metastases, unchanged. Small left pleural effusion. Cholelithiasis, without associated inflammatory changes. Electronically Signed   By: Julian Hy M.D.   On: 11/17/2021 02:32   DG Chest Port 1 View  Result Date: 11/17/2021 CLINICAL DATA:  Nausea/vomiting, weakness, cough.  On chemotherapy. EXAM: PORTABLE CHEST 1 VIEW COMPARISON:  CT chest dated 10/30/2021 FINDINGS: Moderate left pleural effusion. Associated lingular and left lower lobe atelectasis. Eventration of left hemidiaphragm. Right lung is clear.  No pneumothorax. The heart is top-normal in size. Left arm PICC terminates at the cavoatrial junction. IMPRESSION: Moderate left pleural effusion. Associated lingular and left lower lobe atelectasis. Electronically Signed   By: Julian Hy M.D.   On: 11/17/2021 01:31   ECHOCARDIOGRAM COMPLETE  Result Date: 11/09/2021    ECHOCARDIOGRAM REPORT   Patient Name:   LETIA GUIDRY Date of Exam: 11/09/2021 Medical Rec #:  [id number]           Height:       64.0 in Accession #:    [id number]          Weight:       157.4 lb Date of Birth:  April 26, 1961           BSA:          1.767 m Patient Age:    32 years            BP:           144/71 mmHg Patient Gender: F                   HR:           95 bpm. Exam Location:  [facility] Procedure: 2D Echo, Cardiac Doppler and Color Doppler Indications:     I38 Endocarditis  History:         Patient has prior history of Echocardiogram  examinations, most                  recent 10/29/2021. Metastatic cervical cancer.  Sonographer:     Rosalia Hammers Referring Phys:  [id number] Tsosie Billing Diagnosing Phys: Serafina Royals MD  Sonographer Comments: Technically difficult study due to poor echo windows. Image acquisition challenging due to respiratory motion. IMPRESSIONS  1. Left ventricular ejection fraction, by estimation, is 55 to 60%. The left ventricle has normal function. The left ventricle has no regional wall motion abnormalities. Left ventricular diastolic parameters were normal.  2. Right ventricular systolic function is normal. The right ventricular size is normal.  3. The mitral valve is normal in structure. Trivial mitral valve regurgitation.  4. Cannot rule out vegetation due to poor sound transmission. The aortic valve is normal in  structure. Aortic valve regurgitation is trivial. FINDINGS  Left Ventricle: Left ventricular ejection fraction, by estimation, is 55 to 60%. The left ventricle has normal function. The left ventricle has no regional wall motion abnormalities. The left ventricular internal cavity size was normal in size. There is  no left ventricular hypertrophy. Left ventricular diastolic parameters were normal. Right Ventricle: The right ventricular size is normal. No increase in right ventricular wall thickness. Right ventricular systolic function is normal. Left Atrium: Left atrial size was normal in size. Right Atrium: Right atrial size was normal in size. Pericardium: There is no evidence of pericardial effusion. Mitral Valve: The mitral valve is normal in structure. Trivial mitral valve regurgitation. Tricuspid Valve: The tricuspid valve is normal in structure. Tricuspid valve regurgitation is trivial. Aortic Valve: Cannot rule out vegetation due to poor sound transmission. The aortic valve is normal in structure. Aortic valve regurgitation is trivial. Aortic valve mean gradient measures 4.0 mmHg. Aortic valve peak  gradient measures 6.7 mmHg. Aortic valve area, by VTI measures 2.77 cm. Pulmonic Valve: The pulmonic valve was normal in structure. Pulmonic valve regurgitation is trivial. Aorta: The aortic root and ascending aorta are structurally normal, with no evidence of dilitation. IAS/Shunts: No atrial level shunt detected by color flow Doppler.  LEFT VENTRICLE PLAX 2D LVIDd:         4.29 cm LVIDs:         3.16 cm LV PW:         1.38 cm LV IVS:        1.20 cm LVOT diam:     1.80 cm LV SV:         71 LV SV Index:   40 LVOT Area:     2.54 cm  RIGHT VENTRICLE RV Basal diam:  2.82 cm LEFT ATRIUM             Index        RIGHT ATRIUM           Index LA diam:        3.00 cm 1.70 cm/m   RA Area:     10.20 cm LA Vol (A2C):   42.1 ml 23.82 ml/m  RA Volume:   19.40 ml  10.98 ml/m LA Vol (A4C):   30.2 ml 17.09 ml/m LA Biplane Vol: 36.1 ml 20.43 ml/m  AORTIC VALVE AV Area (Vmax):    2.29 cm AV Area (Vmean):   2.34 cm AV Area (VTI):     2.77 cm AV Vmax:           129.00 cm/s AV Vmean:          89.500 cm/s AV VTI:            0.255 m AV Peak Grad:      6.7 mmHg AV Mean Grad:      4.0 mmHg LVOT Vmax:         116.00 cm/s LVOT Vmean:        82.200 cm/s LVOT VTI:          0.278 m LVOT/AV VTI ratio: 1.09  AORTA Ao Root diam: 3.00 cm MITRAL VALVE MV Area (PHT): 2.86 cm     SHUNTS MV Decel Time: 265 msec     Systemic VTI:  0.28 m MV E velocity: 71.00 cm/s   Systemic Diam: 1.80 cm MV A velocity: 120.00 cm/s MV E/A ratio:  0.59 Serafina Royals MD Electronically signed by Serafina Royals MD Signature Date/Time: 11/09/2021/4:02:03 PM  Final    [place] EKG SITE RITE  Result Date: 11/06/2021 If Site Rite image not attached, placement could not be confirmed due to current cardiac rhythm.  CT ABDOMEN PELVIS W CONTRAST  Result Date: 11/05/2021 CLINICAL DATA:  Metastatic cervical cancer re-evaluation. EXAM: CT ABDOMEN AND PELVIS WITH IV AND ORAL CONTRAST TECHNIQUE: Multidetector CT imaging of the abdomen and pelvis was performed after oral  contrast administration using the standard protocol following bolus administration of intravenous contrast. RADIATION DOSE REDUCTION: This exam was performed according to the departmental dose-optimization program which includes automated exposure control, adjustment of the mA and/or kV according to patient size and/or use of iterative reconstruction technique. CONTRAST:  180m OMNIPAQUE IOHEXOL 300 MG/ML  SOLN COMPARISON:  CT abdomen and pelvis with IV contrast 08/10/2021, CT chest, abdomen and pelvis with IV and oral contrast 06/03/2021. FINDINGS: Lower chest: Interval new presumed metastatic nodules noted in the right lower lobe on series 3 axial image 10 (3 mm), image 12 (6 mm), and image 21 (5 mm). Stable 7 mm fissural right infrahilar lung nodule again on axial image 9. Anteriorly in the right middle lobe there is a 1 cm nodule on image 3 which is new, and interval enlargement of a now 1.1 cm nodule on image 1 which previously 4 mm. A 1 cm fissural right lung base nodule on image 4 was previously 4 mm as well. Marked elevation of the left hemidiaphragm is again noted consistent with eventration or paresis with left lung base mostly excluded from the exam aside from visualization of at least a small sized left pleural effusion, possibly larger based on the most recent portable chest 10/31/2021. There is a patulous distal thoracic esophagus with retained versus refluxed contrast within. Small hiatal hernia. There is mild cardiomegaly with no pericardial effusion. Hepatobiliary: Limited liver visualization due to breathing motion. There is no obvious mass enhancement. The liver is mildly steatotic as before. There are multiple calcified gallstones up to 1.2 cm in size but no wall thickening or biliary dilatation. Pancreas: No focal abnormality or inflammatory change. Spleen: No mass or splenomegaly. Adrenals/Urinary Tract: There is no adrenal mass. There are a few too small to characterize hypodensities in the  left kidney which are statistically most likely cysts. Right renal cortex is unremarkable. There is no urinary stone or obstruction. There is no bladder thickening or bladder stone. Stomach/Bowel: No dilatation or wall thickening including the appendix. Scattered uncomplicated colonic diverticula. Mild fecal stasis. Vascular/Lymphatic: Minimal aortic atherosclerosis. Anterior and left para-aortic soft tissue fullness shows improvement. For example soft tissue thickening anterior to the aorta previously measured 1.3 cm in thickness is now no more than 0.8 cm in thickness (series 2 axial 37). Left para-aortic soft tissue fullness was previously 1.2 cm in short axis thickness now 0.9 cm (series 2 axial 46). No pelvic or further abdominal adenopathy is seen. Reproductive: Uterus and bilateral adnexa are unremarkable. Other: There is no incarcerated hernia, no free air, hemorrhage or free fluid. There is a subcutaneous rounded low-density lesion in the left anterolateral abdominal wall measuring 1.4 cm and 0.9 Hounsfield units on 2:38 which could be evidence of recent injection or could be a subcutaneous metastatic deposit. If this is a subcutaneous metastasis I do not see any others. Musculoskeletal: Compression fractures of the L2 lower plate and L4 upper plate are slightly worsened in the interval since 08/10/2021 but there is no new compression fracture. Other visualized vertebra are normal in heights with osteopenia. No destructive bone lesion  is seen. Sclerosis of the sacrum alongside the SI joints is again noted consistent with a healed or mostly healed sacral insufficiency fracture and was seen on the prior study. Trabecular coarsening again noted of the superior left acetabular region and left femoral head again is seen, could be due to hemangiomatous involvement or Paget's disease of bone but does not have the typical appearance of metastatic disease. IMPRESSION: 1. New and enlarging metastatic nodules in the  right middle and lower lobes. 2. Improvement in periaortic adenopathy.  No new adenopathy. 3. Interval mild worsening of compression fractures at L2 and 4. Osteopenia. 4. 1.4 cm low-density subcutaneous rounded nodule or injection site anterolateral left mid abdominal wall. Subcutaneous metastasis is not excluded but no other subcutaneous abnormality is seen elsewhere. 5. Again noted trabecular coarsening in the superior left acetabulum and left femoral head which could be due to hemangiomatous involvement or Paget's disease, but does not have the typical appearance of bone metastases. 6. Healed or mostly healed sacral insufficiency fracture versus chronic stress reaction. 7. Cholelithiasis and additional chronic changes. 8. Aortic atherosclerosis. 9. Patulous distal esophagus containing refluxed or retained contrast. Electronically Signed   By: Telford Nab M.D.   On: 11/05/2021 20:33   DG Swallowing Func-Speech Pathology  Result Date: 11/03/2021 Table formatting from the original result was not included. Objective Swallowing Evaluation: Type of Study: Bedside Swallow Evaluation  Patient Details Name: Maricsa Sammons MRN: [id number] Date of Birth: May 06, 1960 Today's Date: 11/03/2021 Time: SLP Start Time (ACUTE ONLY): 1045 -SLP Stop Time (ACUTE ONLY): 1200 SLP Time Calculation (min) (ACUTE ONLY): 75 min Past Medical History: Past Medical History: Diagnosis Date  Cancer ([place])   cervical CA, that spread to lymph nodes; last chemo was 12/08/2019; radiation 01/2020  COVID-19 10/2018  Nausea with vomiting 10/31/2019  Neoplasm related pain   No pertinent past medical history  Past Surgical History: Past Surgical History: Procedure Laterality Date  ESOPHAGOGASTRODUODENOSCOPY (EGD) WITH PROPOFOL N/A 03/28/2020  Procedure: ESOPHAGOGASTRODUODENOSCOPY (EGD) WITH PROPOFOL;  Surgeon: Milus Banister, MD;  Location: [facility] ENDOSCOPY;  Service: Endoscopy;  Laterality: N/A;  EUS N/A 03/28/2020  Procedure: UPPER ENDOSCOPIC  ULTRASOUND (EUS) RADIAL;  Surgeon: Milus Banister, MD;  Location: [facility] ENDOSCOPY;  Service: Endoscopy;  Laterality: N/A;  IR REMOVAL TUN ACCESS W/ PORT W/O FL MOD SED  10/30/2021  no surgical history   HPI: Pt is a 61 y.o. 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  Husband at bedside who provides the history.  Patient has had a chronic cough for couple of months.  She was known to Oncology previously under my care for metastatic recurrent squamous cell carcinoma of the cervix and switched her care to [facility] oncology.  She has also had recent multiple hospitalization at [facility] due to intractable nausea vomiting as well as E. coli UTI.  Last chemotherapy was on 10/17/2021, she received carboplatin/Taxol/bevacizumab.  Patient has a Mediport.  She has had intractable nausea vomiting despite utilizing her home antiemetics including Zofran, dexamethasone, olanzapine, compazine, Phenergan suppository, scopolamine patch.  Patient has also had cough which triggers for vomiting.   CT of Chest: Large left pleural effusion with associated atelectasis.  2. Bilateral solid pulmonary nodules are decreased in size or no  longer present.  3. Dilated esophagus with air-fluid level, findings can be seen in  the setting of esophageal dysmotility.  4. Mildly heterogeneous thyroid gland, decreased in size when  compared with prior chest CT.   ENT was consulted during this admit for hoarsensss and cough w/ N/V.  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  Subjective: pt awake/alert, verbal. Spanish Interpreter present w/ her during the exam.  Recommendations for follow up therapy are one component of a multi-disciplinary discharge planning  process, led by the attending physician.  Recommendations may be updated based on patient status, additional functional criteria and insurance authorization. Assessment / Plan / Recommendation   10/31/2021   2:00 PM Clinical Impressions Clinical Impression Patient presents with grossly functional oropharyngeal swallowing for age. Oral stage is characterized by adequate lip closure, bolus preparation and containment, and anterior to posterior transit. Swallow initiation occurs at the level of the valleculae. Pharyngeal stage is noted for mildly reduced tongue base retraction (likely age-related), adequate hyolaryngeal excursion, and adequate pharyngeal constriction. Epiglottic deflection is complete; there is no penetration or aspiration. There is mild valleculae and intermittently pyriform sinus residue, greater with thicker and solid consistencies, which is cleared by dry swallow. Pharyngeal stripping wave is complete. Amplitude/duration of cricopharyngeus opening is WFL. There is adequate/complete clearance through the cervical esophagus. An esophageal sweep was performed in the upright lateral position which was unremarkable. 13 mm barium tablet was retained in the valleculae but pt cleared independently with subsequent swallow. Consistencies tested were thin liquids x2 tsps, 1 cup sip, 3 sequential sips, nectar x1 tsp, 1 cup sip, 3 sequential cup sips, honey x1 tsp, pudding x1 tsp, regular solid (1/2 graham cracker with pudding), and 13 mm barium tablet with cup sips of nectar. Pt reported globus sensation at level of thyroid notch even when no contrast/residue remained in the pharynx. Delayed throat clearing ~3-5 minutes after presentation of POs; patient has esophageal w/u pending. Recommend patient continue regular diet with thin liquids; educated pt verbally and in handout form re: strategies including moistening dry meats, alternating solids and liquids, swallowing twice. No further STservices indicated. SLP  Visit Diagnosis Dysphagia, unspecified (R13.10) Impact on safety and function --     10/31/2021   2:00 PM Treatment Recommendations Treatment Recommendations No treatment recommended at this time     10/31/2021   2:00 PM Prognosis Prognosis for Safe Diet Advancement Good Barriers to Reach Goals Time post onset;Severity of deficits Barriers/Prognosis Comment N/V; dry heaves - Ca related   10/31/2021   2:00 PM Diet Recommendations SLP Diet Recommendations Regular solids;Thin liquid Liquid Administration via Cup;Straw Medication Administration Whole meds with puree Compensations Minimize environmental distractions;Slow rate;Small sips/bites;Follow solids with liquid Postural Changes Remain semi-upright after after feeds/meals (Comment);Seated upright at 90 degrees     10/31/2021   2:00 PM Other Recommendations Recommended Consults Consider GI evaluation Oral Care Recommendations Oral care BID;Oral care before and after PO;Patient independent with oral care Other Recommendations -- Follow Up Recommendations No SLP follow up Assistance recommended at discharge PRN Functional Status Assessment Patient has not had a recent decline in their functional status   10/31/2021   2:00 PM Frequency and Duration  Speech Therapy Frequency (ACUTE ONLY) -- Treatment Duration --     10/31/2021   2:00 PM Oral Phase Oral Phase [facility]    10/31/2021   2:00 PM Pharyngeal Phase Pharyngeal Phase [facility]    10/31/2021   2:00 PM Cervical Esophageal Phase  Cervical Esophageal Phase [facility] Happi Overton 11/03/2021,  2:33 PM                     US THORACENTESIS ASP PLEURAL SPACE W/IMG GUIDE  Result Date: 10/31/2021 INDICATION: Left pleural effusion EXAM: ULTRASOUND GUIDED LEFT THORACENTESIS MEDICATIONS: 8 cc 1% lidocaine COMPLICATIONS: None immediate. PROCEDURE: An ultrasound guided thoracentesis was thoroughly discussed with the patient and questions answered. The benefits, risks, alternatives and complications were also discussed. The patient understands and wishes to  proceed with the procedure. Written consent was obtained. Ultrasound was performed to localize and mark an adequate pocket of fluid in the left chest. The area was then prepped and draped in the normal sterile fashion. 1% Lidocaine was used for local anesthesia. Under ultrasound guidance a 6 Fr Safe-T-Centesis catheter was introduced. Thoracentesis was performed. The catheter was removed and a dressing applied. FINDINGS: A total of approximately 500 mL of yellow fluid was removed. Samples were sent to the laboratory as requested by the clinical team IMPRESSION: Successful ultrasound guided left thoracentesis yielding 500 mL of pleural fluid. Follow-up chest x-ray revealed no pneumothorax. Read by: Reatha Armour, PA-C Electronically Signed   By: Corrie Mckusick D.O.   On: 10/31/2021 13:10   DG Chest Port 1 View  Result Date: 10/31/2021 CLINICAL DATA:  Status post thoracentesis. EXAM: PORTABLE CHEST 1 VIEW COMPARISON:  10/28/2021. FINDINGS: Large left pleural effusion with overlying left basilar opacities. Right lung is clear. Prominent right-sided nipple shadow. No visible pneumothorax. Cardiomediastinal silhouette is partially obscured, but enlarged. Contrast and partially imaged stomach and bowel. IMPRESSION: Large left pleural effusion with overlying left basilar opacities. No visible pneumothorax. Electronically Signed   By: Margaretha Sheffield M.D.   On: 10/31/2021 12:51   CT CHEST W CONTRAST  Result Date: 10/30/2021 CLINICAL DATA:  Vocal cord paralysis, history of breast cancer with pulmonary metastasis; * Tracking Code: BO * EXAM: CT CHEST WITH CONTRAST TECHNIQUE: Multidetector CT imaging of the chest was performed during intravenous contrast administration. RADIATION DOSE REDUCTION: This exam was performed according to the departmental dose-optimization program which includes automated exposure control, adjustment of the mA and/or kV according to patient size and/or use of iterative reconstruction  technique. CONTRAST:  14m OMNIPAQUE IOHEXOL 300 MG/ML  SOLN COMPARISON:  Chest CT dated August 10, 2021 FINDINGS: Cardiovascular: Normal heart size. No pericardial effusion. No coronary artery calcifications. Normal caliber thoracic aorta with mild atherosclerotic disease. Mediastinum/Nodes: Dilated esophagus with air-fluid level. Mildly heterogeneous thyroid gland which is decreased in size when compared with prior CT. No pathologically enlarged lymph nodes seen in the chest. Lungs/Pleura: Central airways are patent. Large left pleural effusion with atelectasis. Bilateral solid pulmonary nodules are decreased or no longer present, although portions of the left lung are not visualized due to large pleural effusion which somewhat limits evaluation. Reference subpleural nodule of the left upper lobe measuring 5 mm on series 4, image 17, previously measured 9 mm. Upper Abdomen: Cholelithiasis.  No acute abnormality. Musculoskeletal: Soft tissue gas of the right anterior chest wall, compatible with history of recent port removal. No acute or significant osseous findings. IMPRESSION: 1. Large left pleural effusion with associated atelectasis. 2. Bilateral solid pulmonary nodules are decreased in size or no longer present. 3. Dilated esophagus with air-fluid level, findings can be seen in the setting of esophageal dysmotility. 4. Mildly heterogeneous thyroid gland, decreased in size when compared with prior chest CT. 5. Aortic Atherosclerosis (ICD10-I70.0). Electronically Signed   By: LYetta GlassmanM.D.   On: 10/30/2021 16:40  CT SOFT TISSUE NECK W CONTRAST  Result Date: 10/30/2021 CLINICAL DATA:  Vocal cord paralysis, history of breast cancer EXAM: CT NECK WITH CONTRAST TECHNIQUE: Multidetector CT imaging of the neck was performed using the standard protocol following the bolus administration of intravenous contrast. RADIATION DOSE REDUCTION: This exam was performed according to the departmental dose-optimization  program which includes automated exposure control, adjustment of the mA and/or kV according to patient size and/or use of iterative reconstruction technique. CONTRAST:  33m OMNIPAQUE IOHEXOL 300 MG/ML  SOLN COMPARISON:  Correlation made with CTA neck from 2021 FINDINGS: Pharynx and larynx: No mass or swelling. Appearance suggestive of left vocal cord weakness or paralysis. Salivary glands: Parotid and submandibular glands are unremarkable. Thyroid: Unremarkable. Lymph nodes: A mildly enlarged left supraclavicular node was not present on the prior study. This measures 1.2 cm on series 3, image 74. Vascular: Major neck vessels are patent. Partially retropharyngeal course of the ICAs. Limited intracranial: No abnormal enhancement. 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 Visualized orbits: Unremarkable. Mastoids and visualized paranasal sinuses: No significant opacification. Skeleton: No acute osseous abnormality. Upper chest: Postprocedural changes of same day chest port removal. Refer to concurrent dedicated chest CT. Other: None. IMPRESSION: Possible left vocal cord weakness or paralysis. Correlate with clinical exam. Indeterminate mildly enlarged left supraclavicular lymph node. Electronically Signed   By: PMacy MisM.D.   On: 10/30/2021 16:33   IR REMOVAL TUN ACCESS W/ PORT W/O FL MOD SED  Result Date: 10/30/2021 INDICATION: Port removal, infection EXAM: Port removal MEDICATIONS: Per EMR ANESTHESIA/SEDATION: Moderate (conscious) sedation was employed during this procedure. A total of Versed 1 mg and Fentanyl 50 mcg was administered intravenously. Moderate Sedation Time: 12 minutes. The patient's level of consciousness and vital signs were monitored continuously by radiology nursing throughout the procedure under my direct supervision. FLUOROSCOPY TIME:  N/a COMPLICATIONS: None immediate. PROCEDURE: Informed written consent was obtained from the patient after a thorough discussion of the procedural risks, benefits  and alternatives. All questions were addressed. Maximal Sterile Barrier Technique was utilized including caps, mask, sterile gowns, sterile gloves, sterile drape, hand hygiene and skin antiseptic. A timeout was performed prior to the initiation of the procedure. The right chest was prepped and draped in the standard sterile fashion. Lidocaine was utilized for local anesthesia. An incision was made over the previously healed surgical incision. Utilizing blunt dissection, the port catheter and reservoir were removed from the underlying subcutaneous tissue in their entirety. After hemostasis, the subcutaneous pockets was closed in two layers using 3-0 and 4-0 Vicryl. Dermabond was also applied. The patient tolerated the procedure well without immediate complication. IMPRESSION: Successful removal of right-sided chest port. The catheter tip and port were sent for microbiology culture. Electronically Signed   By: YAlbin FellingM.D.   On: 10/30/2021 16:14   MR BRAIN WO CONTRAST  Result Date: 10/29/2021 CLINICAL DATA:  Headache EXAM: MRI HEAD WITHOUT CONTRAST TECHNIQUE: Multiplanar, multiecho pulse sequences of the brain and surrounding structures were obtained without intravenous contrast. COMPARISON:  None Available. FINDINGS: Brain: No acute infarct, mass effect or extra-axial collection. No acute or chronic hemorrhage. There is multifocal hyperintense T2-weighted signal within the white matter. Parenchymal volume and CSF spaces are normal. The midline structures are normal. Vascular: Major flow voids are preserved. Skull and upper cervical spine: Normal calvarium and skull base. Visualized upper cervical spine and soft tissues are normal. Sinuses/Orbits:No paranasal sinus fluid levels or advanced mucosal thickening. No mastoid or middle ear effusion. Normal orbits. IMPRESSION: 1. No  acute intracranial abnormality. 2. Multifocal hyperintense T2-weighted signal within the white matter, most commonly seen in the  setting of chronic small vessel ischemia. Electronically Signed   By: Ulyses Jarred M.D.   On: 10/29/2021 22:17   ECHOCARDIOGRAM COMPLETE  Result Date: 10/29/2021    ECHOCARDIOGRAM REPORT   Patient Name:   AARALYNN SHEPHEARD Date of Exam: 10/29/2021 Medical Rec #:  [id number]           Height:       64.0 in Accession #:    [id number]          Weight:       160.9 lb Date of Birth:  1960-11-19           BSA:          1.784 m Patient Age:    67 years            BP:           151/76 mmHg Patient Gender: F                   HR:           90 bpm. Exam Location:  [facility] Procedure: 2D Echo, Color Doppler and Cardiac Doppler Indications:     R00.8 Other abnormalities of the heart  History:         Patient has no prior history of Echocardiogram examinations.                  Signs/Symptoms:Candidemia.  Sonographer:     Charmayne Sheer Referring Phys:  [id number] A CALDWELL POWELL JR Diagnosing Phys: Kate Sable MD  Sonographer Comments: Suboptimal parasternal window and suboptimal subcostal window. IMPRESSIONS  1. Left ventricular ejection fraction, by estimation, is 50 to 55%. The left ventricle has low normal function. The left ventricle has no regional wall motion abnormalities. There is mild left ventricular hypertrophy. Left ventricular diastolic parameters are consistent with Grade I diastolic dysfunction (impaired relaxation).  2. Right ventricular systolic function is normal. The right ventricular size is normal.  3. The mitral valve is normal in structure. No evidence of mitral valve regurgitation.  4. The aortic valve was not well visualized. Aortic valve regurgitation is not visualized.  5. The inferior vena cava is normal in size with greater than 50% respiratory variability, suggesting right atrial pressure of 3 mmHg. FINDINGS  Left Ventricle: Left ventricular ejection fraction, by estimation, is 50 to 55%. The left ventricle has low normal function. The left ventricle has no regional wall motion abnormalities. The  left ventricular internal cavity size was normal in size. There is mild left ventricular hypertrophy. Left ventricular diastolic parameters are consistent with Grade I diastolic dysfunction (impaired relaxation). Right Ventricle: The right ventricular size is normal. No increase in right ventricular wall thickness. Right ventricular systolic function is normal. Left Atrium: Left atrial size was normal in size. Right Atrium: Right atrial size was normal in size. Pericardium: There is no evidence of pericardial effusion. Mitral Valve: The mitral valve is normal in structure. No evidence of mitral valve regurgitation. Tricuspid Valve: The tricuspid valve is not well visualized. Tricuspid valve regurgitation is not demonstrated. Aortic Valve: The aortic valve was not well visualized. Aortic valve regurgitation is not visualized. Aortic valve mean gradient measures 3.0 mmHg. Aortic valve peak gradient measures 6.6 mmHg. Aortic valve area, by VTI measures 2.27 cm. Pulmonic Valve: The pulmonic valve was not well visualized. Pulmonic valve regurgitation is not visualized. Aorta: The  aortic root and ascending aorta are structurally normal, with no evidence of dilitation. Venous: The inferior vena cava is normal in size with greater than 50% respiratory variability, suggesting right atrial pressure of 3 mmHg. IAS/Shunts: No atrial level shunt detected by color flow Doppler.  LEFT VENTRICLE PLAX 2D LVIDd:         3.00 cm   Diastology LVIDs:         2.51 cm   LV e' medial:    4.68 cm/s LV PW:         1.12 cm   LV E/e' medial:  15.0 LV IVS:        0.73 cm   LV e' lateral:   5.22 cm/s LVOT diam:     1.90 cm   LV E/e' lateral: 13.4 LV SV:         48 LV SV Index:   27 LVOT Area:     2.84 cm  RIGHT VENTRICLE RV Basal diam:  2.46 cm LEFT ATRIUM             Index        RIGHT ATRIUM          Index LA diam:        3.20 cm 1.79 cm/m   RA Area:     8.72 cm LA Vol (A2C):   24.0 ml 13.45 ml/m  RA Volume:   16.10 ml 9.03 ml/m LA Vol  (A4C):   59.9 ml 33.58 ml/m LA Biplane Vol: 41.9 ml 23.49 ml/m  AORTIC VALVE                    PULMONIC VALVE AV Area (Vmax):    2.26 cm     PV Vmax:       0.99 m/s AV Area (Vmean):   2.33 cm     PV Peak grad:  3.9 mmHg AV Area (VTI):     2.27 cm AV Vmax:           128.00 cm/s AV Vmean:          83.700 cm/s AV VTI:            0.212 m AV Peak Grad:      6.6 mmHg AV Mean Grad:      3.0 mmHg LVOT Vmax:         102.00 cm/s LVOT Vmean:        68.700 cm/s LVOT VTI:          0.170 m LVOT/AV VTI ratio: 0.80  AORTA Ao Root diam: 3.40 cm MITRAL VALVE MV Area (PHT): 10.18 cm    SHUNTS MV Decel Time: 75 msec      Systemic VTI:  0.17 m MV E velocity: 70.05 cm/s   Systemic Diam: 1.90 cm MV A velocity: 117.50 cm/s MV E/A ratio:  0.60 Kate Sable MD Electronically signed by Kate Sable MD Signature Date/Time: 10/29/2021/3:32:34 PM    Final    DG Chest Portable 1 View  Result Date: 10/28/2021 CLINICAL DATA:  Cough EXAM: PORTABLE CHEST 1 VIEW COMPARISON:  08/10/2021 FINDINGS: Interval placement of a right-sided chest port with distal tip terminating near the level of the superior cavoatrial junction. Low lung volumes with chronic elevation of the left hemidiaphragm. Heart size and mediastinal contours are largely obscured. Dense left basilar opacification may reflect atelectasis, consolidation, and or effusion. No pneumothorax. IMPRESSION: Low lung volumes with chronic elevation of the left hemidiaphragm. Dense left basilar opacification may  reflect atelectasis, consolidation, and/or effusion. Electronically Signed   By: Davina Poke D.O.   On: 10/28/2021 16:42   CT HEAD WO CONTRAST  Result Date: 10/28/2021 CLINICAL DATA:  61 year old female with headache following injury. Initial encounter. EXAM: CT HEAD WITHOUT CONTRAST TECHNIQUE: Contiguous axial images were obtained from the base of the skull through the vertex without intravenous contrast. RADIATION DOSE REDUCTION: This exam was performed according to  the departmental dose-optimization program which includes automated exposure control, adjustment of the mA and/or kV according to patient size and/or use of iterative reconstruction technique. COMPARISON:  None Available. FINDINGS: Brain: No evidence of acute infarction, hemorrhage, hydrocephalus, extra-axial collection or mass lesion/mass effect. The cerebellar tonsils appear low lying. Vascular: Carotid and vertebral atherosclerotic calcifications are noted. Skull: Normal. Negative for fracture or focal lesion. Sinuses/Orbits: No acute finding. Other: None. IMPRESSION: 1. No evidence of acute intracranial abnormality. 2. 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 Consider MRI for further evaluation as indicated. Electronically Signed   By: Margarette Canada M.D.   On: 10/28/2021 16:03    Microbiology: Results for orders placed or performed during the hospital encounter of 11/17/21  Blood culture (routine x 2)     Status: None (Preliminary result)   Collection Time: 11/17/21 12:44 AM   Specimen: BLOOD  Result Value Ref Range Status   Specimen Description BLOOD RIGHT [facility]  Final   Special Requests   Final    BOTTLES DRAWN AEROBIC AND ANAEROBIC Blood Culture adequate volume   Culture   Final    NO GROWTH 2 DAYS Performed at [facility], [address] [place], [place] [address]    Report Status PENDING  Incomplete  Blood culture (routine x 2)     Status: None (Preliminary result)   Collection Time: 11/17/21  1:44 AM   Specimen: BLOOD  Result Value Ref Range Status   Specimen Description BLOOD RIGHT [facility]  Final   Special Requests   Final    BOTTLES DRAWN AEROBIC AND ANAEROBIC Blood Culture adequate volume   Culture   Final    NO GROWTH 2 DAYS Performed at [facility], [address], [place], [place] [address]    Report Status PENDING  Incomplete  Urine Culture     Status: Abnormal   Collection Time: 11/17/21  1:44 AM   Specimen: Urine, Clean Catch  Result  Value Ref Range Status   Specimen Description   Final    URINE, CLEAN CATCH Performed at [facility], [address] [place], [place] [address]    Special Requests   Final    NONE Performed at [facility], [address], [place], [place] [address]    Culture MULTIPLE SPECIES PRESENT, SUGGEST RECOLLECTION (A)  Final   Report Status 11/18/2021 FINAL  Final  SARS Coronavirus 2 by RT PCR (hospital order, performed in [place] hospital lab) *cepheid single result test* Anterior Nasal Swab     Status: None   Collection Time: 11/17/21  1:44 AM   Specimen: Anterior Nasal Swab  Result Value Ref Range Status   SARS Coronavirus 2 by RT PCR NEGATIVE NEGATIVE Final    Comment: (NOTE) SARS-CoV-2 target nucleic acids are NOT DETECTED.  The SARS-CoV-2 RNA is generally detectable in upper and lower respiratory specimens during the acute phase of infection. The lowest concentration of SARS-CoV-2 viral copies this assay can detect is 250 copies / mL. A negative result does not preclude SARS-CoV-2 infection and should not be used  as the sole basis for treatment or other patient management decisions.  A negative result may occur with improper specimen collection / handling, submission of specimen other than nasopharyngeal swab, presence of viral mutation(s) within the areas targeted by this assay, and inadequate number of viral copies (<250 copies / mL). A negative result must be combined with clinical observations, patient history, and epidemiological information.  Fact Sheet for Patients:   https://www.patel.info/  Fact Sheet for Healthcare Providers: https://hall.com/  This test is not yet approved or  cleared by the [place] FDA and has been authorized for detection and/or diagnosis of SARS-CoV-2 by FDA under an Emergency Use Authorization (EUA).  This EUA will remain in effect (meaning this test can be used) for the  duration of the COVID-19 declaration under Section 564(b)(1) of the Act, 21 U.S.C. section 360bbb-3(b)(1), unless the authorization is terminated or revoked sooner.  Performed at [facility], [place] [place], [place] [address]   MRSA Next Gen by PCR, Nasal     Status: None   Collection Time: 11/17/21  8:20 AM   Specimen: Nasal Mucosa; Nasal Swab  Result Value Ref Range Status   MRSA by PCR Next Gen NOT DETECTED NOT DETECTED Final    Comment: (NOTE) The GeneXpert MRSA Assay (FDA approved for NASAL specimens only), is one component of a comprehensive MRSA colonization surveillance program. It is not intended to diagnose MRSA infection nor to guide or monitor treatment for MRSA infections. Test performance is not FDA approved in patients less than 60 years old. Performed at [facility], [address] [place], [place] [address]     Labs: CBC: Recent Labs  Lab 11/17/21 0044 11/17/21 0546  WBC 4.9 4.2  NEUTROABS 2.6  --   HGB 8.4* 7.7*  HCT 25.6* 24.1*  MCV 94.8 98.0  PLT 308 491   Basic Metabolic Panel: Recent Labs  Lab 11/17/21 0044 11/17/21 0546 11/17/21 0908 11/18/21 0515 11/19/21 0637  NA 134* 135  --  136 137  K <2.0* 2.7* 2.5* 3.5 3.5  CL 87* 90*  --  96* 97*  CO2 36* 35*  --  33* 31  GLUCOSE 124* 105*  --  90 113*  BUN 12 10  --  8 10  CREATININE 1.06* 0.87  --  0.98 0.95  CALCIUM 8.6* 8.2*  --  8.2* 8.5*  MG 1.6* 2.3 2.3  --   --   PHOS  --   --   --  2.7  --    Liver Function Tests: Recent Labs  Lab 11/17/21 0044  AST 36  ALT 20  ALKPHOS 75  BILITOT 0.5  PROT 6.4*  ALBUMIN 3.0*   CBG: Recent Labs  Lab 11/17/21 0757  GLUCAP 97    Discharge time spent: greater than 30 minutes.  This record has been created using Systems analyst. Errors have been sought and corrected,but may not always be located. Such creation errors do not reflect on the standard of care.   Signed: Lorella Nimrod,  MD [facility] 11/19/2021

## 2021-11-20 ENCOUNTER — Telehealth: Payer: Self-pay

## 2021-11-20 NOTE — Telephone Encounter (Signed)
Received call from Lorenzo requesting orders from recent hospitalization stay. 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 Routing to  provider to also make aware.  Eugenia Mcalpine

## 2021-11-22 LAB — CULTURE, BLOOD (ROUTINE X 2)
Culture: NO GROWTH
Culture: NO GROWTH
Special Requests: ADEQUATE
Special Requests: ADEQUATE

## 2021-12-02 ENCOUNTER — Ambulatory Visit: Payer: BLUE CROSS/BLUE SHIELD | Attending: Infectious Diseases | Admitting: Infectious Diseases

## 2021-12-02 ENCOUNTER — Encounter: Payer: Self-pay | Admitting: Infectious Diseases

## 2021-12-02 VITALS — BP 128/83 | HR 85 | Temp 97.7°F

## 2021-12-02 DIAGNOSIS — B49 Unspecified mycosis: Secondary | ICD-10-CM | POA: Diagnosis present

## 2021-12-02 DIAGNOSIS — Y838 Other surgical procedures as the cause of abnormal reaction of the patient, or of later complication, without mention of misadventure at the time of the procedure: Secondary | ICD-10-CM | POA: Diagnosis not present

## 2021-12-02 DIAGNOSIS — J3801 Paralysis of vocal cords and larynx, unilateral: Secondary | ICD-10-CM | POA: Diagnosis not present

## 2021-12-02 DIAGNOSIS — B3789 Other sites of candidiasis: Secondary | ICD-10-CM | POA: Diagnosis not present

## 2021-12-02 DIAGNOSIS — Z8541 Personal history of malignant neoplasm of cervix uteri: Secondary | ICD-10-CM | POA: Diagnosis not present

## 2021-12-02 DIAGNOSIS — T859XXA Unspecified complication of internal prosthetic device, implant and graft, initial encounter: Secondary | ICD-10-CM | POA: Diagnosis not present

## 2021-12-02 DIAGNOSIS — Z79899 Other long term (current) drug therapy: Secondary | ICD-10-CM | POA: Diagnosis not present

## 2021-12-02 DIAGNOSIS — Q391 Atresia of esophagus with tracheo-esophageal fistula: Secondary | ICD-10-CM | POA: Diagnosis not present

## 2021-12-02 DIAGNOSIS — R053 Chronic cough: Secondary | ICD-10-CM | POA: Insufficient documentation

## 2021-12-02 DIAGNOSIS — Z923 Personal history of irradiation: Secondary | ICD-10-CM | POA: Diagnosis not present

## 2021-12-02 DIAGNOSIS — R1110 Vomiting, unspecified: Secondary | ICD-10-CM | POA: Diagnosis not present

## 2021-12-02 DIAGNOSIS — C7800 Secondary malignant neoplasm of unspecified lung: Secondary | ICD-10-CM | POA: Insufficient documentation

## 2021-12-02 DIAGNOSIS — Z7952 Long term (current) use of systemic steroids: Secondary | ICD-10-CM | POA: Insufficient documentation

## 2021-12-02 DIAGNOSIS — Z9221 Personal history of antineoplastic chemotherapy: Secondary | ICD-10-CM | POA: Insufficient documentation

## 2021-12-02 NOTE — Progress Notes (Signed)
NAME: Jill Rodriguez  DOB: [birth year]  MRN: [id number]  Date/Time: 12/02/2021 10:52 AM   Subjective:  Pt here with husband ?follow up after hospitalization between 7/4- 11/10/21 Jill Rodriguez is a 61 y.o. with a history of Spanish interpretor by phone Jill Rodriguez is a 61 y.o. 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 , restarted chemo in May 2023 at [facility], last chemo on 10/17/21 was recently in hospital for candidemia and PORT infection- the port was removed and culture was candida albicans As patient unable to eat because of nausea and vomiting she is getting IV fluconazole for 6 weeks- end day 12/11/21  Past Medical History:  Diagnosis Date   Cancer ([place])    cervical CA, that spread to lymph nodes; last chemo was 12/08/2019; radiation 01/2020   COVID-19 10/2018   Nausea with vomiting 10/31/2019   Neoplasm related pain    No pertinent past medical history     Past Surgical History:  Procedure Laterality Date   ESOPHAGOGASTRODUODENOSCOPY (EGD) WITH PROPOFOL N/A 03/28/2020   Procedure: ESOPHAGOGASTRODUODENOSCOPY (EGD) WITH PROPOFOL;  Surgeon: Milus Banister, MD;  Location: [facility];  Service: Endoscopy;  Laterality: N/A;   EUS N/A 03/28/2020   Procedure: UPPER ENDOSCOPIC ULTRASOUND (EUS) RADIAL;  Surgeon: Milus Banister, MD;  Location: [facility];  Service: Endoscopy;  Laterality: N/A;   IR REMOVAL TUN ACCESS W/ PORT W/O FL MOD SED  10/30/2021   no surgical history      Social History   Socioeconomic History   Marital status: Married    Spouse name: Rosezetta Schlatter    Number of children: 3   Years of education: Not on file   Highest education level: Not on file  Occupational History   Not on file  Tobacco Use   Smoking status: Never   Smokeless tobacco: Never  Vaping Use   Vaping Use: Never used  Substance and Sexual Activity   Alcohol use: Never   Drug use: Never   Sexual activity: Yes    Birth control/protection: None  Other Topics  Concern   Not on file  Social History Narrative   Lives at home with spouse ; daughter [name] comes & stays when needed   Social Determinants of Health   Financial Resource Strain: Not on file  Food Insecurity: Not on file  Transportation Needs: Not on file  Physical Activity: Not on file  Stress: Not on file  Social Connections: Not on file  Intimate Partner Violence: Not on file    Family History  Problem Relation Age of Onset   Blindness Mother    Glaucoma Mother    Diabetes Father    Diabetes Brother    No Known Allergies I? Current Outpatient Medications  Medication Sig Dispense Refill   dexamethasone (DECADRON) 4 MG tablet Take 4 mg by mouth as directed. Take 1 tablet ('4mg'$ ) 6/17-6/18 After chemo, take 2 tablets ('8mg'$ ) on days 2-4 then 1 tablet (4 mg) on days 5-7     dronabinol (MARINOL) 2.5 MG capsule Take 2.5 mg by mouth daily.     feeding supplement (ENSURE ENLIVE / ENSURE PLUS) LIQD Take 237 mLs by mouth 2 (two) times daily between meals. 237 mL 12   fluconazole (DIFLUCAN) IVPB Inject 400 mg into the vein daily. Indication:  C albicans fungemia due to port infection First Dose: Yes Last Day of Therapy:  12/11/2021 Labs - Once weekly:  CBC/D and CMP Please pull PIC at completion  of IV antibiotics Fax weekly lab results  promptly to [phone] Method of administration: Premix bag / Control-A-Flo (CAF) Method of administration may be changed at the discretion of the patient and/or caregiver's ability to self-administer the medication ordered. 34 Units 0   HYDROcodone-acetaminophen (NORCO) 5-325 MG tablet Take 1 tablet by mouth every 6 (six) hours as needed for moderate pain. 30 tablet 0   lip balm (CARMEX) ointment Apply topically as needed for lip care. 7 g 0   LORazepam (ATIVAN) 0.5 MG tablet Take 0.5 mg by mouth every 8 (eight) hours as needed.     Multiple Vitamins-Minerals (THERA-M) TABS Take 1 tablet by mouth daily.     OLANZapine (ZYPREXA) 5 MG tablet Take 5 mg  by mouth at bedtime.     ondansetron (ZOFRAN) 8 MG tablet Take 8 mg by mouth 3 (three) times daily.     prochlorperazine (COMPAZINE) 10 MG tablet Take 1 tablet (10 mg total) by mouth every 8 (eight) hours as needed (Nausea or vomiting). 90 tablet 0   scopolamine (TRANSDERM-SCOP) 1 MG/3DAYS Place 1 patch (1.5 mg total) onto the skin every 3 (three) days. 10 patch 0   traZODone (DESYREL) 50 MG tablet Take 1 tablet (50 mg total) by mouth at bedtime as needed for sleep. 30 tablet 1   No current facility-administered medications for this visit.     Abtx:  Anti-infectives (From admission, onward)    None       REVIEW OF SYSTEMS:  Const: negative fever, negative chills, negative weight loss Eyes: negative diplopia or visual changes, negative eye pain ENT: hoarse voice Resp:  cough,  dyspnea Cards: negative for chest pain, palpitations, lower extremity edema GU: negative for frequency, dysuria and hematuria GI: vomiting, poor appetite Skin: negative for rash and pruritus Heme: negative for easy bruising and gum/nose bleeding MS: general weakness Neurolo:negative for headaches, dizziness, vertigo, memory problems  Psych: , depression  Endocrine: negative for thyroid, diabetes Allergy/Immunology- negative for any medication or food allergies ? Objective:  VITALS:  BP 128/83   Pulse 85   Temp 97.7 F (36.5 C) (Oral)  LDA Left PICC PHYSICAL EXAM:  General: Alert, cooperative, no distress, flat affect  Head: Normocephalic, without obvious abnormality, atraumatic. Eyes: Conjunctivae clear, anicteric sclerae. Pupils are equal ENT Nares normal. No drainage or sinus tenderness. Lips, mucosa, and tongue normal. No Thrush Neck: Supple, symmetrical, no adenopathy, thyroid: non tender no carotid bruit and no JVD. Back: No CVA tenderness. Lungs: b/l air entry Heart: Regular rate and rhythm, no murmur, rub or gallop. Abdomen: Soft, non-tender,not distended. Bowel sounds normal. No  masses Extremities: atraumatic, no cyanosis. No edema. No clubbing Skin: No rashes or lesions. Or bruising Lymph: Cervical, supraclavicular normal. 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 ? ___________________________________________________ Discussed with patient, and her spouse Note:  This document was prepared using Dragon voice recognition software and may include unintentional dictation errors.

## 2021-12-02 NOTE — Patient Instructions (Addendum)
You are here for follow up of the blood infection and port infection with candida. You will complete IV fluconazole on 12/11/21 and the picc can be removed after that. I will see you on 12/18/21 and repeat blood culture

## 2021-12-03 ENCOUNTER — Other Ambulatory Visit: Payer: Self-pay

## 2021-12-18 ENCOUNTER — Ambulatory Visit: Payer: BLUE CROSS/BLUE SHIELD | Admitting: Infectious Diseases

## 2022-04-27 DEATH — deceased
